# Patient Record
Sex: Female | Born: 1935 | Race: Black or African American | Hispanic: No | Marital: Single | Smoking: Never smoker
Health system: Southern US, Community
[De-identification: ages and names within clinical notes are randomized; demographics above are authoritative.]

## PROBLEM LIST (undated history)

## (undated) DIAGNOSIS — E785 Hyperlipidemia, unspecified: Secondary | ICD-10-CM

## (undated) DIAGNOSIS — I4891 Unspecified atrial fibrillation: Secondary | ICD-10-CM

## (undated) DIAGNOSIS — D649 Anemia, unspecified: Secondary | ICD-10-CM

## (undated) DIAGNOSIS — N189 Chronic kidney disease, unspecified: Secondary | ICD-10-CM

## (undated) DIAGNOSIS — Z7901 Long term (current) use of anticoagulants: Secondary | ICD-10-CM

## (undated) DIAGNOSIS — I10 Essential (primary) hypertension: Secondary | ICD-10-CM

## (undated) HISTORY — DX: Chronic kidney disease, unspecified: N18.9

## (undated) HISTORY — DX: Essential (primary) hypertension: I10

## (undated) HISTORY — DX: Long term (current) use of anticoagulants: Z79.01

## (undated) HISTORY — DX: Hyperlipidemia, unspecified: E78.5

## (undated) HISTORY — PX: PARTIAL HYSTERECTOMY: SHX80

## (undated) HISTORY — DX: Anemia, unspecified: D64.9

## (undated) HISTORY — DX: Unspecified atrial fibrillation: I48.91

## (undated) HISTORY — PX: APPENDECTOMY: SHX54

---

## 1993-05-03 HISTORY — PX: BUNIONECTOMY: SHX129

## 2000-08-11 ENCOUNTER — Other Ambulatory Visit: Admission: RE | Admit: 2000-08-11 | Discharge: 2000-08-11 | Payer: Self-pay

## 2002-11-19 ENCOUNTER — Encounter: Payer: Self-pay | Admitting: Family Medicine

## 2002-11-19 ENCOUNTER — Ambulatory Visit (HOSPITAL_COMMUNITY): Admission: RE | Admit: 2002-11-19 | Discharge: 2002-11-19 | Payer: Self-pay | Admitting: Family Medicine

## 2003-07-08 ENCOUNTER — Ambulatory Visit (HOSPITAL_COMMUNITY): Admission: RE | Admit: 2003-07-08 | Discharge: 2003-07-08 | Payer: Self-pay | Admitting: General Surgery

## 2003-12-12 ENCOUNTER — Ambulatory Visit (HOSPITAL_COMMUNITY): Admission: RE | Admit: 2003-12-12 | Discharge: 2003-12-12 | Payer: Self-pay | Admitting: Family Medicine

## 2004-03-16 ENCOUNTER — Ambulatory Visit: Payer: Self-pay | Admitting: *Deleted

## 2004-04-20 ENCOUNTER — Ambulatory Visit: Payer: Self-pay | Admitting: Internal Medicine

## 2004-05-22 ENCOUNTER — Ambulatory Visit: Payer: Self-pay | Admitting: Cardiology

## 2004-06-01 ENCOUNTER — Ambulatory Visit: Payer: Self-pay | Admitting: *Deleted

## 2004-06-17 ENCOUNTER — Ambulatory Visit: Payer: Self-pay | Admitting: *Deleted

## 2004-07-06 ENCOUNTER — Ambulatory Visit: Payer: Self-pay | Admitting: *Deleted

## 2004-07-27 ENCOUNTER — Ambulatory Visit: Payer: Self-pay | Admitting: *Deleted

## 2004-09-07 ENCOUNTER — Ambulatory Visit: Payer: Self-pay | Admitting: *Deleted

## 2004-10-06 ENCOUNTER — Ambulatory Visit: Payer: Self-pay | Admitting: Cardiology

## 2004-10-26 ENCOUNTER — Ambulatory Visit: Payer: Self-pay | Admitting: *Deleted

## 2004-11-30 ENCOUNTER — Ambulatory Visit: Payer: Self-pay | Admitting: Cardiology

## 2004-12-28 ENCOUNTER — Ambulatory Visit: Payer: Self-pay | Admitting: Cardiology

## 2005-01-29 ENCOUNTER — Ambulatory Visit: Payer: Self-pay | Admitting: *Deleted

## 2005-02-26 ENCOUNTER — Ambulatory Visit: Payer: Self-pay | Admitting: Cardiology

## 2005-03-29 ENCOUNTER — Ambulatory Visit: Payer: Self-pay | Admitting: *Deleted

## 2005-04-13 ENCOUNTER — Ambulatory Visit: Payer: Self-pay | Admitting: Cardiology

## 2005-04-23 ENCOUNTER — Ambulatory Visit (HOSPITAL_COMMUNITY): Admission: RE | Admit: 2005-04-23 | Discharge: 2005-04-23 | Payer: Self-pay | Admitting: Family Medicine

## 2005-05-18 ENCOUNTER — Ambulatory Visit: Payer: Self-pay | Admitting: *Deleted

## 2005-06-08 ENCOUNTER — Ambulatory Visit: Payer: Self-pay | Admitting: *Deleted

## 2005-08-11 ENCOUNTER — Ambulatory Visit: Payer: Self-pay | Admitting: Cardiology

## 2005-09-22 ENCOUNTER — Ambulatory Visit: Payer: Self-pay | Admitting: *Deleted

## 2005-11-02 ENCOUNTER — Ambulatory Visit: Payer: Self-pay | Admitting: *Deleted

## 2005-12-01 ENCOUNTER — Ambulatory Visit: Payer: Self-pay | Admitting: Cardiology

## 2005-12-29 ENCOUNTER — Ambulatory Visit: Payer: Self-pay | Admitting: *Deleted

## 2006-01-27 ENCOUNTER — Ambulatory Visit: Payer: Self-pay | Admitting: Cardiology

## 2006-02-03 ENCOUNTER — Ambulatory Visit: Payer: Self-pay | Admitting: Cardiology

## 2006-02-22 ENCOUNTER — Ambulatory Visit (HOSPITAL_COMMUNITY): Admission: RE | Admit: 2006-02-22 | Discharge: 2006-02-22 | Payer: Self-pay | Admitting: Family Medicine

## 2006-03-10 ENCOUNTER — Ambulatory Visit: Payer: Self-pay | Admitting: *Deleted

## 2006-04-07 ENCOUNTER — Ambulatory Visit: Payer: Self-pay | Admitting: Cardiology

## 2006-05-12 ENCOUNTER — Ambulatory Visit: Payer: Self-pay | Admitting: Internal Medicine

## 2006-06-17 ENCOUNTER — Ambulatory Visit: Payer: Self-pay | Admitting: Internal Medicine

## 2006-06-24 ENCOUNTER — Ambulatory Visit: Payer: Self-pay | Admitting: Cardiology

## 2006-07-11 ENCOUNTER — Ambulatory Visit: Payer: Self-pay | Admitting: Cardiology

## 2006-08-08 ENCOUNTER — Ambulatory Visit: Payer: Self-pay | Admitting: Internal Medicine

## 2006-09-06 ENCOUNTER — Ambulatory Visit: Payer: Self-pay | Admitting: Cardiovascular Disease

## 2006-10-04 ENCOUNTER — Ambulatory Visit: Payer: Self-pay | Admitting: Cardiology

## 2006-10-18 ENCOUNTER — Ambulatory Visit: Payer: Self-pay | Admitting: Cardiology

## 2006-11-22 ENCOUNTER — Ambulatory Visit: Payer: Self-pay | Admitting: Cardiology

## 2006-12-21 ENCOUNTER — Ambulatory Visit: Payer: Self-pay | Admitting: Cardiovascular Disease

## 2007-01-03 ENCOUNTER — Ambulatory Visit: Payer: Self-pay | Admitting: Cardiovascular Disease

## 2007-01-26 ENCOUNTER — Ambulatory Visit: Payer: Self-pay | Admitting: Cardiovascular Disease

## 2007-02-23 ENCOUNTER — Ambulatory Visit: Payer: Self-pay | Admitting: Cardiology

## 2007-03-16 ENCOUNTER — Ambulatory Visit (HOSPITAL_COMMUNITY): Admission: RE | Admit: 2007-03-16 | Discharge: 2007-03-16 | Payer: Self-pay | Admitting: Family Medicine

## 2007-03-24 ENCOUNTER — Ambulatory Visit: Payer: Self-pay | Admitting: Cardiology

## 2007-04-04 ENCOUNTER — Ambulatory Visit: Payer: Self-pay | Admitting: Cardiology

## 2007-04-24 ENCOUNTER — Ambulatory Visit: Payer: Self-pay | Admitting: Internal Medicine

## 2007-05-22 ENCOUNTER — Ambulatory Visit: Payer: Self-pay | Admitting: Cardiology

## 2007-06-19 ENCOUNTER — Ambulatory Visit: Payer: Self-pay | Admitting: Cardiology

## 2007-07-06 ENCOUNTER — Ambulatory Visit: Payer: Self-pay | Admitting: Cardiology

## 2007-07-17 ENCOUNTER — Ambulatory Visit: Payer: Self-pay | Admitting: Cardiology

## 2007-08-09 ENCOUNTER — Ambulatory Visit: Payer: Self-pay | Admitting: Cardiology

## 2007-09-13 ENCOUNTER — Ambulatory Visit: Payer: Self-pay | Admitting: Cardiology

## 2007-10-12 ENCOUNTER — Ambulatory Visit: Payer: Self-pay | Admitting: Cardiovascular Disease

## 2007-11-09 ENCOUNTER — Ambulatory Visit: Payer: Self-pay | Admitting: Internal Medicine

## 2007-11-23 ENCOUNTER — Ambulatory Visit: Payer: Self-pay | Admitting: Cardiology

## 2007-12-27 ENCOUNTER — Ambulatory Visit: Payer: Self-pay | Admitting: Cardiology

## 2008-01-04 ENCOUNTER — Ambulatory Visit: Payer: Self-pay | Admitting: Cardiology

## 2008-01-18 ENCOUNTER — Ambulatory Visit: Payer: Self-pay | Admitting: Cardiology

## 2008-01-25 ENCOUNTER — Ambulatory Visit: Payer: Self-pay | Admitting: Cardiology

## 2008-02-08 ENCOUNTER — Ambulatory Visit: Payer: Self-pay | Admitting: Cardiology

## 2008-02-22 ENCOUNTER — Ambulatory Visit: Payer: Self-pay | Admitting: Cardiology

## 2008-03-14 ENCOUNTER — Ambulatory Visit: Payer: Self-pay | Admitting: Cardiology

## 2008-04-08 ENCOUNTER — Ambulatory Visit (HOSPITAL_COMMUNITY): Admission: RE | Admit: 2008-04-08 | Discharge: 2008-04-08 | Payer: Self-pay | Admitting: Family Medicine

## 2008-04-18 ENCOUNTER — Ambulatory Visit: Payer: Self-pay | Admitting: Cardiology

## 2008-05-09 ENCOUNTER — Ambulatory Visit: Payer: Self-pay | Admitting: Cardiology

## 2008-06-10 ENCOUNTER — Ambulatory Visit: Payer: Self-pay | Admitting: Cardiology

## 2008-06-17 ENCOUNTER — Ambulatory Visit: Payer: Self-pay | Admitting: Cardiology

## 2008-07-04 ENCOUNTER — Ambulatory Visit: Payer: Self-pay | Admitting: Cardiology

## 2008-08-01 ENCOUNTER — Ambulatory Visit: Payer: Self-pay | Admitting: Cardiology

## 2008-08-22 ENCOUNTER — Ambulatory Visit: Payer: Self-pay | Admitting: Cardiology

## 2008-09-02 ENCOUNTER — Encounter (INDEPENDENT_AMBULATORY_CARE_PROVIDER_SITE_OTHER): Payer: Self-pay | Admitting: *Deleted

## 2008-09-02 ENCOUNTER — Ambulatory Visit: Payer: Self-pay | Admitting: Cardiology

## 2008-09-02 LAB — CONVERTED CEMR LAB
ALT: <8 units/L
AST: 14 units/L
Albumin: 4.3 g/dL
Alkaline Phosphatase: 64 units/L
BUN: 39 mg/dL
CO2: 17 meq/L
Calcium: 9.7 mg/dL
Chloride: 108 meq/L
Creatinine, Ser: 1.78 mg/dL
Glucose, Bld: 108 mg/dL
Potassium: 4.7 meq/L
Sodium: 139 meq/L
Total Protein: 7.7 g/dL

## 2008-09-26 ENCOUNTER — Ambulatory Visit: Payer: Self-pay | Admitting: Cardiology

## 2008-10-24 ENCOUNTER — Ambulatory Visit: Payer: Self-pay | Admitting: Cardiology

## 2008-11-14 ENCOUNTER — Ambulatory Visit: Payer: Self-pay | Admitting: Cardiology

## 2008-12-05 ENCOUNTER — Ambulatory Visit: Payer: Self-pay | Admitting: Cardiology

## 2008-12-16 ENCOUNTER — Encounter: Payer: Self-pay | Admitting: *Deleted

## 2008-12-23 ENCOUNTER — Encounter (INDEPENDENT_AMBULATORY_CARE_PROVIDER_SITE_OTHER): Payer: Self-pay | Admitting: *Deleted

## 2008-12-23 LAB — CONVERTED CEMR LAB
OCCULT 1: NEGATIVE
OCCULT 2: NEGATIVE
OCCULT 3: POSITIVE

## 2009-01-15 ENCOUNTER — Ambulatory Visit: Payer: Self-pay

## 2009-01-15 LAB — CONVERTED CEMR LAB: POC INR: 3.1

## 2009-02-10 ENCOUNTER — Ambulatory Visit: Payer: Self-pay | Admitting: Cardiology

## 2009-02-10 LAB — CONVERTED CEMR LAB: POC INR: 2.5

## 2009-03-10 ENCOUNTER — Ambulatory Visit: Payer: Self-pay | Admitting: Cardiology

## 2009-03-10 LAB — CONVERTED CEMR LAB: POC INR: 2.3

## 2009-04-09 ENCOUNTER — Encounter (INDEPENDENT_AMBULATORY_CARE_PROVIDER_SITE_OTHER): Payer: Self-pay | Admitting: *Deleted

## 2009-04-09 DIAGNOSIS — D649 Anemia, unspecified: Secondary | ICD-10-CM

## 2009-04-09 DIAGNOSIS — I10 Essential (primary) hypertension: Secondary | ICD-10-CM | POA: Insufficient documentation

## 2009-04-09 DIAGNOSIS — I4891 Unspecified atrial fibrillation: Secondary | ICD-10-CM | POA: Insufficient documentation

## 2009-04-10 ENCOUNTER — Ambulatory Visit: Payer: Self-pay | Admitting: Cardiology

## 2009-04-10 LAB — CONVERTED CEMR LAB: POC INR: 2.8

## 2009-04-11 ENCOUNTER — Encounter: Payer: Self-pay | Admitting: Cardiology

## 2009-04-14 ENCOUNTER — Encounter (INDEPENDENT_AMBULATORY_CARE_PROVIDER_SITE_OTHER): Payer: Self-pay | Admitting: *Deleted

## 2009-04-24 ENCOUNTER — Encounter (INDEPENDENT_AMBULATORY_CARE_PROVIDER_SITE_OTHER): Payer: Self-pay | Admitting: *Deleted

## 2009-04-24 ENCOUNTER — Encounter: Payer: Self-pay | Admitting: Cardiology

## 2009-04-28 LAB — CONVERTED CEMR LAB
ALT: <8 units/L (ref 0–35)
AST: 15 units/L (ref 0–37)
Albumin: 4.6 g/dL (ref 3.5–5.2)
Alkaline Phosphatase: 67 units/L (ref 39–117)
BUN: 35 mg/dL — ABNORMAL HIGH (ref 6–23)
Basophils Absolute: 0 10*3/uL (ref 0.0–0.1)
Basophils Relative: 0 % (ref 0–1)
CO2: 18 meq/L — ABNORMAL LOW (ref 19–32)
Calcium: 9.4 mg/dL (ref 8.4–10.5)
Chloride: 110 meq/L (ref 96–112)
Creatinine, Ser: 1.77 mg/dL — ABNORMAL HIGH (ref 0.40–1.20)
Eosinophils Absolute: 0.9 10*3/uL — ABNORMAL HIGH (ref 0.0–0.7)
Eosinophils Relative: 14 % — ABNORMAL HIGH (ref 0–5)
Glucose, Bld: 109 mg/dL — ABNORMAL HIGH (ref 70–99)
HCT: 32.3 % — ABNORMAL LOW (ref 36.0–46.0)
Hemoglobin: 10.5 g/dL — ABNORMAL LOW (ref 12.0–15.0)
Lymphocytes Relative: 31 % (ref 12–46)
Lymphs Abs: 2.1 10*3/uL (ref 0.7–4.0)
MCHC: 32.5 g/dL (ref 30.0–36.0)
MCV: 96.7 fL (ref 78.0–100.0)
Monocytes Absolute: 0.5 10*3/uL (ref 0.1–1.0)
Monocytes Relative: 8 % (ref 3–12)
Neutro Abs: 3.3 10*3/uL (ref 1.7–7.7)
Neutrophils Relative %: 48 % (ref 43–77)
Platelets: 237 10*3/uL (ref 150–400)
Potassium: 4.1 meq/L (ref 3.5–5.3)
RBC: 3.34 M/uL — ABNORMAL LOW (ref 3.87–5.11)
RDW: 13.2 % (ref 11.5–15.5)
Sodium: 141 meq/L (ref 135–145)
Total Bilirubin: 0.4 mg/dL (ref 0.3–1.2)
Total Protein: 7.9 g/dL (ref 6.0–8.3)
WBC: 6.9 10*3/uL (ref 4.0–10.5)

## 2009-04-30 ENCOUNTER — Ambulatory Visit: Payer: Self-pay | Admitting: Cardiology

## 2009-04-30 LAB — CONVERTED CEMR LAB
Basophils Absolute: 0 10*3/uL (ref 0.0–0.1)
Basophils Relative: 1 % (ref 0–1)
Eosinophils Absolute: 1.4 10*3/uL — ABNORMAL HIGH (ref 0.0–0.7)
Eosinophils Relative: 25 % — ABNORMAL HIGH (ref 0–5)
HCT: 30.5 % — ABNORMAL LOW (ref 36.0–46.0)
Hemoglobin: 10.4 g/dL — ABNORMAL LOW (ref 12.0–15.0)
Iron: 53 ug/dL (ref 42–145)
Lymphocytes Relative: 31 % (ref 12–46)
Lymphs Abs: 1.8 10*3/uL (ref 0.7–4.0)
MCHC: 34.1 g/dL (ref 30.0–36.0)
MCV: 93.8 fL (ref 78.0–100.0)
Monocytes Absolute: 0.5 10*3/uL (ref 0.1–1.0)
Monocytes Relative: 8 % (ref 3–12)
Neutro Abs: 2 10*3/uL (ref 1.7–7.7)
Neutrophils Relative %: 35 % — ABNORMAL LOW (ref 43–77)
Platelets: 251 10*3/uL (ref 150–400)
RBC: 3.25 M/uL — ABNORMAL LOW (ref 3.87–5.11)
RDW: 13 % (ref 11.5–15.5)
Retic Ct Pct: 0.8 % (ref 0.4–3.1)
Saturation Ratios: 19 % — ABNORMAL LOW (ref 20–55)
TIBC: 272 ug/dL (ref 250–470)
UIBC: 219 ug/dL
WBC: 5.7 10*3/uL (ref 4.0–10.5)

## 2009-05-05 ENCOUNTER — Encounter (INDEPENDENT_AMBULATORY_CARE_PROVIDER_SITE_OTHER): Payer: Self-pay | Admitting: *Deleted

## 2009-05-05 LAB — CONVERTED CEMR LAB
OCCULT 1: NEGATIVE
OCCULT 2: NEGATIVE
OCCULT 3: NEGATIVE

## 2009-05-06 ENCOUNTER — Encounter (INDEPENDENT_AMBULATORY_CARE_PROVIDER_SITE_OTHER): Payer: Self-pay | Admitting: *Deleted

## 2009-05-08 ENCOUNTER — Encounter (INDEPENDENT_AMBULATORY_CARE_PROVIDER_SITE_OTHER): Payer: Self-pay | Admitting: *Deleted

## 2009-05-08 ENCOUNTER — Telehealth (INDEPENDENT_AMBULATORY_CARE_PROVIDER_SITE_OTHER): Payer: Self-pay | Admitting: *Deleted

## 2009-05-08 ENCOUNTER — Encounter: Payer: Self-pay | Admitting: Cardiology

## 2009-05-15 ENCOUNTER — Ambulatory Visit: Payer: Self-pay | Admitting: Cardiovascular Disease

## 2009-05-15 LAB — CONVERTED CEMR LAB: POC INR: 2.5

## 2009-06-24 ENCOUNTER — Ambulatory Visit: Payer: Self-pay | Admitting: Cardiology

## 2009-06-24 ENCOUNTER — Encounter: Payer: Self-pay | Admitting: Adult Health

## 2009-07-03 ENCOUNTER — Ambulatory Visit: Payer: Self-pay | Admitting: Cardiology

## 2009-07-03 LAB — CONVERTED CEMR LAB: POC INR: 3.1

## 2009-07-22 ENCOUNTER — Telehealth (INDEPENDENT_AMBULATORY_CARE_PROVIDER_SITE_OTHER): Payer: Self-pay

## 2009-08-06 ENCOUNTER — Ambulatory Visit: Payer: Self-pay | Admitting: Cardiology

## 2009-08-06 LAB — CONVERTED CEMR LAB: POC INR: 2.1

## 2009-09-01 ENCOUNTER — Ambulatory Visit (HOSPITAL_COMMUNITY): Admission: RE | Admit: 2009-09-01 | Discharge: 2009-09-01 | Payer: Self-pay | Admitting: Family Medicine

## 2009-09-17 ENCOUNTER — Ambulatory Visit: Payer: Self-pay | Admitting: Cardiology

## 2009-10-01 ENCOUNTER — Encounter (INDEPENDENT_AMBULATORY_CARE_PROVIDER_SITE_OTHER): Payer: Self-pay | Admitting: *Deleted

## 2009-10-01 LAB — CONVERTED CEMR LAB
BUN: 27 mg/dL
CO2: 21 meq/L
Calcium: 9.4 mg/dL
Creatinine, Ser: 1.51 mg/dL
Glucose, Bld: 95 mg/dL
Sodium: 139 meq/L

## 2009-10-15 ENCOUNTER — Ambulatory Visit: Payer: Self-pay | Admitting: Cardiology

## 2009-11-20 ENCOUNTER — Ambulatory Visit: Payer: Self-pay | Admitting: Cardiology

## 2009-12-30 ENCOUNTER — Encounter (INDEPENDENT_AMBULATORY_CARE_PROVIDER_SITE_OTHER): Payer: Self-pay | Admitting: *Deleted

## 2010-01-01 ENCOUNTER — Ambulatory Visit: Payer: Self-pay | Admitting: Cardiology

## 2010-01-01 DIAGNOSIS — E785 Hyperlipidemia, unspecified: Secondary | ICD-10-CM | POA: Insufficient documentation

## 2010-01-03 LAB — CONVERTED CEMR LAB
Basophils Absolute: 0 10*3/uL (ref 0.0–0.1)
Basophils Relative: 1 % (ref 0–1)
HDL: 49 mg/dL (ref >39)
Hemoglobin: 11.6 g/dL — ABNORMAL LOW (ref 12.0–15.0)
LDL Cholesterol: 159 mg/dL — ABNORMAL HIGH (ref 0–99)
MCHC: 33.2 g/dL (ref 30.0–36.0)
Monocytes Absolute: 0.7 10*3/uL (ref 0.1–1.0)
Neutro Abs: 3.1 10*3/uL (ref 1.7–7.7)
Platelets: 230 10*3/uL (ref 150–400)
RDW: 12.7 % (ref 11.5–15.5)

## 2010-01-15 ENCOUNTER — Encounter (INDEPENDENT_AMBULATORY_CARE_PROVIDER_SITE_OTHER): Payer: Self-pay | Admitting: *Deleted

## 2010-01-22 ENCOUNTER — Ambulatory Visit: Payer: Self-pay | Admitting: Cardiology

## 2010-02-05 ENCOUNTER — Ambulatory Visit: Payer: Self-pay | Admitting: Cardiology

## 2010-02-05 LAB — CONVERTED CEMR LAB: POC INR: 2.1

## 2010-03-02 ENCOUNTER — Ambulatory Visit: Payer: Self-pay | Admitting: Cardiology

## 2010-03-05 ENCOUNTER — Ambulatory Visit: Payer: Self-pay | Admitting: Cardiology

## 2010-03-11 ENCOUNTER — Encounter: Payer: Self-pay | Admitting: Cardiology

## 2010-03-16 ENCOUNTER — Telehealth (INDEPENDENT_AMBULATORY_CARE_PROVIDER_SITE_OTHER): Payer: Self-pay

## 2010-04-08 ENCOUNTER — Ambulatory Visit: Payer: Self-pay | Admitting: Cardiovascular Disease

## 2010-04-22 ENCOUNTER — Telehealth (INDEPENDENT_AMBULATORY_CARE_PROVIDER_SITE_OTHER): Payer: Self-pay | Admitting: *Deleted

## 2010-04-24 ENCOUNTER — Emergency Department (HOSPITAL_COMMUNITY)
Admission: EM | Admit: 2010-04-24 | Discharge: 2010-04-24 | Disposition: A | Discharge status: Other Acute Inpatient Hospital | Payer: Self-pay | Source: Home / Self Care | Admitting: Emergency Medicine

## 2010-04-24 ENCOUNTER — Telehealth (INDEPENDENT_AMBULATORY_CARE_PROVIDER_SITE_OTHER): Payer: Self-pay | Admitting: *Deleted

## 2010-04-24 ENCOUNTER — Encounter: Payer: Self-pay | Admitting: Vascular Surgery

## 2010-04-24 ENCOUNTER — Inpatient Hospital Stay (HOSPITAL_COMMUNITY)
Admission: EM | Admit: 2010-04-24 | Discharge: 2010-05-06 | Payer: Self-pay | Source: Home / Self Care | Admitting: Emergency Medicine

## 2010-04-25 ENCOUNTER — Encounter: Payer: Self-pay | Admitting: Cardiology

## 2010-04-29 ENCOUNTER — Encounter: Payer: Self-pay | Admitting: Vascular Surgery

## 2010-04-30 ENCOUNTER — Ambulatory Visit: Payer: Self-pay

## 2010-04-30 ENCOUNTER — Encounter: Payer: Self-pay | Admitting: Internal Medicine

## 2010-05-01 ENCOUNTER — Encounter (INDEPENDENT_AMBULATORY_CARE_PROVIDER_SITE_OTHER): Payer: Self-pay | Admitting: Critical Care Medicine

## 2010-05-06 ENCOUNTER — Inpatient Hospital Stay
Admission: AD | Admit: 2010-05-06 | Discharge: 2010-05-29 | Payer: Self-pay | Source: Home / Self Care | Attending: Internal Medicine | Admitting: Internal Medicine

## 2010-05-06 LAB — PROTIME-INR
INR: 1.7 — ABNORMAL HIGH (ref 0.00–1.49)
Prothrombin Time: 20.2 seconds — ABNORMAL HIGH (ref 11.6–15.2)

## 2010-05-06 LAB — CBC
HCT: 33.2 % — ABNORMAL LOW (ref 36.0–46.0)
Hemoglobin: 10.6 g/dL — ABNORMAL LOW (ref 12.0–15.0)
MCH: 29.5 pg (ref 26.0–34.0)
MCHC: 31.9 g/dL (ref 30.0–36.0)
MCV: 92.5 fL (ref 78.0–100.0)
Platelets: 444 10*3/uL — ABNORMAL HIGH (ref 150–400)
RBC: 3.59 MIL/uL — ABNORMAL LOW (ref 3.87–5.11)
RDW: 14.5 % (ref 11.5–15.5)
WBC: 10.6 10*3/uL — ABNORMAL HIGH (ref 4.0–10.5)

## 2010-05-15 ENCOUNTER — Ambulatory Visit
Admission: RE | Admit: 2010-05-15 | Discharge: 2010-05-15 | Payer: Self-pay | Source: Home / Self Care | Attending: Vascular Surgery | Admitting: Vascular Surgery

## 2010-05-22 ENCOUNTER — Ambulatory Visit
Admission: RE | Admit: 2010-05-22 | Discharge: 2010-05-22 | Payer: Self-pay | Source: Home / Self Care | Attending: Vascular Surgery | Admitting: Vascular Surgery

## 2010-05-24 ENCOUNTER — Encounter: Payer: Self-pay | Admitting: Family Medicine

## 2010-05-24 ENCOUNTER — Encounter: Payer: Self-pay | Admitting: General Surgery

## 2010-05-25 NOTE — Assessment & Plan Note (Addendum)
OFFICE VISIT  [name], [name] DOB:  [birth year]                                       05/22/2010 CHART#:[id number]  This is a postop followup.  HISTORY OF PRESENT ILLNESS:  This is a 77 year old female that I have previously done a right leg thromboembolectomy on.  She had very bad tibial vessel disease that was not amendable to any type of surgical bypass.  She required four compartment fasciotomy and while in the hospital she  tore her lateral staple line and then undergoing wound care at the nursing home.  No fevers or chills noted by the patient. She notes some pain in her foot at this point.  She denies however any numbness or tingling in the foot and is able to ambulate with her foot with some difficulty.  PHYSICAL EXAMINATION:  She had a blood pressure 158/88, pulse of 79, respirations were 18.  On physical examination the right leg after taking down all staples and sutures the medial staple line is intact. There is a small area of ischemic skin medially that previously was not here.  On the lateral surface, at the inferior aspect of the incision there is also an area of ischemic skin change that was not present, the area that was previously adjacent to the dehiscence in the skin staple line is at this point is no better than before, by appearance it does not appear that an enzymatic debrider has been used on this wound.  The superior aspect of this incision however remains intact.  MEDICAL DECISION MAKING:  This is a 77 year old female status post a thromboembolectomy of right leg with four compartment fasciotomy.  This patient unfortunately does not have a revascularization option and subsequently would require amputation if she does not heal this wound. She is not interested in an amputation at this point so I am going to refer her to the [facility] wound care center for additional intervention for her wound as it appears that her nursing home  is not adequately addressing it though I had given them instructions to apply Santyl to the ischemic areas twice a day and then do wet-to-dry dressing changes on top, it does not appear that they have been doing such.  So we will have her referred to the [facility] wound care center and then see her back in a couple of weeks after that appointment is set up.    Fransisco Hertz, MD Electronically Signed  BLC/MEDQ  D:  05/22/2010  T:  05/25/2010  Job:  [id number]

## 2010-05-28 ENCOUNTER — Ambulatory Visit (HOSPITAL_COMMUNITY)
Admission: RE | Admit: 2010-05-28 | Discharge: 2010-05-28 | Payer: Self-pay | Source: Home / Self Care | Attending: Internal Medicine | Admitting: Internal Medicine

## 2010-06-01 ENCOUNTER — Encounter: Payer: Self-pay | Admitting: Cardiology

## 2010-06-01 ENCOUNTER — Encounter (HOSPITAL_BASED_OUTPATIENT_CLINIC_OR_DEPARTMENT_OTHER)
Admission: RE | Admit: 2010-06-01 | Discharge: 2010-06-02 | Payer: Self-pay | Source: Home / Self Care | Attending: Internal Medicine | Admitting: Internal Medicine

## 2010-06-01 ENCOUNTER — Telehealth: Payer: Self-pay | Admitting: Cardiology

## 2010-06-01 LAB — CONVERTED CEMR LAB: POC INR: 2.9

## 2010-06-02 NOTE — Assessment & Plan Note (Signed)
Summary: ROV SWELLING IN LEGS/TMJ   Visit Type:  Follow-up Primary Provider:  Dr.Gerald Hill  CC:  EDEMA IN FEET .  History of Present Illness: [name] is a pleasant 77 AAF who we are following with history of HTN and atrial fibrillation.  She is on coumadin and is followed by our office for PT INR.  When seen last by Dr. Dietrich Pates, Norvasc 5mg  was added in addition to Diltiazem 420mg  and Triamatrene-HCTZ. She had complaints of LEE edema and was seen by her primary care physician, Dr. Loleta Chance.  Norvasc was discontinued.  Her swelling was improved.    She is without complaints at this time. BP is moderately controlled without the Norvasc. Review of records shows that her last ECHO was in 1997 and showed mild LVH and normal EF at that time.    Current Medications (verified): 1)  Cardura 8 Mg Tabs (Doxazosin Mesylate) .... Take 1 Tablet Once A Day 2)  Lisinopril 40 Mg Tabs (Lisinopril) .... Take 1 Tab Daily 3)  Warfarin Sodium 5 Mg Tabs (Warfarin Sodium) .... Take 1 Tablet By Mouth As Directed 4)  Metoprolol Tartrate 25 Mg Tabs (Metoprolol Tartrate) .... Take One Tablet By Mouth Twice A Day 5)  Re Chlordiazepoxide/clidinium 5-2.5 Mg Caps (Clidinium-Chlordiazepoxide) .... Take1 Tab Three Times A Day 6)  Alprazolam 1 Mg Tabs (Alprazolam) .... Take 1 Tab Daily 7)  Triamterene-Hctz 37.5-25 Mg Tabs (Triamterene-Hctz) .... Take 1 Tab Daily 8)  Diltiazem Hcl Er Beads 240 Mg Xr24h-Cap (Diltiazem Hcl Er Beads) .... Take 1 Tablet By Mouth Once Daily 9)  Clonidine Hcl 0.1 Mg Tabs (Clonidine Hcl) .... Take 1 Tablet By Mouth Two Times A Day  Allergies (verified): No Known Drug Allergies  Past History:  Past medical, surgical, family and social histories (including risk factors) reviewed, and no changes noted (except as noted below).  Past Medical History: Reviewed history from 04/10/2009 and no changes required. ATRIAL FIBRILLATION, CHRONIC (ICD-427.31)-requiring anticoagulation RENAL INSUFFICIENCY  (ICD-588.9) HYPERTENSION (ICD-401.9) ANEMIA (ICD-285.9)  Past Surgical History: Reviewed history from 04/09/2009 and no changes required. appendectomy partial hysterectomy foot surgery 1995  Family History: Reviewed history from 04/09/2009 and no changes required. Father:deceased cause unknown Mother:deceased cause unknown  Social History: Reviewed history from 04/09/2009 and no changes required. Retired  Single  Tobacco Use - No.  Alcohol Use - no Regular Exercise - no Drug Use - no  Review of Systems       The patient complains of peripheral edema.         All other systems have been reviewed and are negative unless stated above.   Vital Signs:  Patient profile:   77 year old female Weight:      154 pounds Pulse rate:   53 / minute Pulse (ortho):   62 / minute BP sitting:   151 / 55  (right arm) BP standing:   161 / 72  Vitals Entered By: Dreama Saa, CNA (June 24, 2009 1:20 PM)  Serial Vital Signs/Assessments:  Time      Position  BP       Pulse  Resp  Temp     By 1:57 PM   Lying LA  140/60   53                    Lynn Via LPN 0:73 PM   Sitting   148/69   58  Larita Fife Via LPN 0:27 PM   Standing  161/72   62                    Lynn Via LPN   Physical Exam  General:  Well developed, well nourished, in no acute distress. Lungs:  Clear bilaterally to auscultation and percussion. Heart:  Non-displaced PMI, chest non-tender; regular rate and rhythm, S1, S2 without murmurs, rubs or gallops. Carotid upstroke normal, no bruit. Normal abdominal aortic size, no bruits. Femorals normal pulses, no bruits. Pedals normal pulses. No edema, no varicosities. Abdomen:  Bowel sounds positive; abdomen soft and non-tender without masses, organomegaly, or hernias noted. No hepatosplenomegaly. Pulses:  pulses normal in all 4 extremities Extremities:  1+ left pedal edema and 1+ right pedal edema.   Psych:  Normal affect.   EKG  Procedure date:   06/24/2009  Findings:      Afib with slow ventricular response 46 bpm.   Comments:      Evidence of remote septal MI.    Impression & Recommendations:  Problem # 1:  ATRIAL FIBRILLATION, CHRONIC (ICD-427.31) Her HR is bradycardic at this time.  I have discussed this with Dr. Dietrich Pates concerning medication adjustment in this setting.  We will decrease her cardiazem to 240mg  daily. She will follow-up with a rhythm strip in one week. Her updated medication list for this problem includes:    Warfarin Sodium 5 Mg Tabs (Warfarin sodium) .Marland Kitchen... Take 1 tablet by mouth as directed    Metoprolol Tartrate 25 Mg Tabs (Metoprolol tartrate) .Marland Kitchen... Take one tablet by mouth twice a day  Problem # 2:  HYPERTENSION (ICD-401.9) We will add clonidine 0.1mg  two times a day to maintain BP control with decrease in cardiazem.  This should not cause edema as norvasc did.  We will follow-up next week for BP check at time of rhythm strip.  Monitor HR with clonidine to avoid bradycardia. The following medications were removed from the medication list:    Cardizem Cd 120 Mg Xr24h-cap (Diltiazem hcl coated beads) .Marland Kitchen... Take 1 tab daily    Norvasc 5 Mg Tabs (Amlodipine besylate) .Marland Kitchen... Take 1 tablet by mouth once daily Her updated medication list for this problem includes:    Cardura 8 Mg Tabs (Doxazosin mesylate) .Marland Kitchen... Take 1 tablet once a day    Lisinopril 40 Mg Tabs (Lisinopril) .Marland Kitchen... Take 1 tab daily    Metoprolol Tartrate 25 Mg Tabs (Metoprolol tartrate) .Marland Kitchen... Take one tablet by mouth twice a day    Triamterene-hctz 37.5-25 Mg Tabs (Triamterene-hctz) .Marland Kitchen... Take 1 tab daily    Diltiazem Hcl Er Beads 240 Mg Xr24h-cap (Diltiazem hcl er beads) .Marland Kitchen... Take 1 tablet by mouth once daily    Clonidine Hcl 0.1 Mg Tabs (Clonidine hcl) .Marland Kitchen... Take 1 tablet by mouth two times a day  Patient Instructions: 1)  Your physician recommends that you schedule a follow-up appointment in: 1 week for blood pressure check with nurse and in 1  month with Dr. Dietrich Pates 2)  Your physician has recommended you make the following change in your medication: Stop taking Norvasc. Start taking Clonidine 0.1mg  by mouth two times a day and decrease Diltiazem (Cardiazem) to 240mg  by mouth once daily  Prescriptions: CARDURA 8 MG TABS (DOXAZOSIN MESYLATE) Take 1 tablet once a day  #30 x 6   Entered by:   Teressa Lower RN   Authorized by:   Joni Reining, NP   Signed by:   Teressa Lower RN on 06/24/2009  Method used:   Electronically to        [facility] [phone]* (retail)       [address]       [place], [place]  [address]       Ph: [phone] or [phone]       Fax: [phone]   RxID:   [id number] LISINOPRIL 40 MG TABS (LISINOPRIL) TAKE 1 TAB DAILY  #30 x 6   Entered by:   Teressa Lower RN   Authorized by:   Joni Reining, NP   Signed by:   Teressa Lower RN on 06/24/2009   Method used:   Electronically to        [facility]. [phone]* (retail)       [address]       [place], [place]  [address]       Ph: [phone] or [phone]       Fax: [phone]   RxID:   [id number] CLONIDINE HCL 0.1 MG TABS (CLONIDINE HCL) take 1 tablet by mouth two times a day  #60 x 6   Entered by:   Larita Fife Via LPN   Authorized by:   Joni Reining, NP   Signed by:   Larita Fife Via LPN on 83/15/1761   Method used:   Electronically to        [facility] [phone]* (retail)       [address]       [place], [place]  [address]       Ph: [phone] or [phone]       Fax: [phone]   RxID:   [id number] DILTIAZEM HCL ER BEADS 240 MG XR24H-CAP (DILTIAZEM HCL ER BEADS) take 1 tablet by mouth once daily  #30 x 6   Entered by:   Larita Fife Via LPN   Authorized by:   Joni Reining, NP   Signed by:   Larita Fife Via LPN on 78/24/2353   Method used:   Electronically to        [facility] [phone]* (retail)       [address]       [place], [place]  [address]        Ph: [phone] or [phone]       Fax: [phone]   RxID:   [id number]

## 2010-06-02 NOTE — Miscellaneous (Signed)
Summary: stool cards 05/05/2009  Clinical Lists Changes  Observations: Added new observation of HEMOCCULT 3: neg (05/05/2009 10:43) Added new observation of HEMOCCULT 2: neg (05/05/2009 10:43) Added new observation of HEMOCCULT 1: neg (05/05/2009 10:43)

## 2010-06-02 NOTE — Medication Information (Signed)
Summary: ccr-lr  Anticoagulant Therapy  Managed by: Vashti Hey, RN PCP: Jerral Bonito MD: Diona Browner MD, Remi Deter Indication 1: Aortic Valve Replacement (ICD-V43.3) Lab Used: [facility] [place] Site: [place] INR POC 1.6  Dietary changes: no    Health status changes: no    Bleeding/hemorrhagic complications: no    Recent/future hospitalizations: no    Any changes in medication regimen? no    Recent/future dental: no  Any missed doses?: no       Is patient compliant with meds? yes       Allergies: No Known Drug Allergies  Anticoagulation Management History:      The patient is taking warfarin and comes in today for a routine follow up visit.  Positive risk factors for bleeding include an age of 75 years or older and presence of serious comorbidities.  The bleeding index is 'intermediate risk'.  Positive CHADS2 values include History of HTN and Age > 63 years old.  The start date was 04/11/1996.  Anticoagulation responsible provider: Diona Browner MD, Remi Deter.  INR POC: 1.6.  Cuvette Lot#: [id number].  Exp: 10/11.    Anticoagulation Management Assessment/Plan:      The patient's current anticoagulation dose is Warfarin sodium 5 mg tabs: Take 1 tablet by mouth as directed.  The target INR is 2 - 3.  The next INR is due 03/23/2010.  Anticoagulation instructions were given to patient.  Results were reviewed/authorized by Vashti Hey, RN.  She was notified by Vashti Hey RN.         Prior Anticoagulation Instructions: INR 2.1 Pt has been taking coumadin 5mg  once daily except 7.5mg  on Thursdays Continue same dose  Current Anticoagulation Instructions: INR 1.6 Take coumadin 2 tablets tonight, 1 1/2 tablets tomorrow night then resume 1 tablet once daily except 1 1/2 tablets on Thursdays

## 2010-06-02 NOTE — Medication Information (Signed)
Summary: ccr-lr  Anticoagulant Therapy  Managed by: Vashti Hey, RN PCP: Jackelyn Poling Supervising MD: Eden Emms MD, Theron Arista Indication 1: Aortic Valve Replacement (ICD-V43.3) Lab Used: [facility] [place] Site: [place] INR POC 2.5  Dietary changes: no    Health status changes: no    Bleeding/hemorrhagic complications: no    Recent/future hospitalizations: no    Any changes in medication regimen? no    Recent/future dental: no  Any missed doses?: no       Is patient compliant with meds? yes       Allergies: No Known Drug Allergies  Anticoagulation Management History:      The patient is taking warfarin and comes in today for a routine follow up visit.  Positive risk factors for bleeding include an age of 75 years or older and presence of serious comorbidities.  The bleeding index is 'intermediate risk'.  Positive CHADS2 values include History of HTN and Age > 71 years old.  The start date was 04/11/1996.  Anticoagulation responsible provider: Eden Emms MD, Theron Arista.  INR POC: 2.5.  Cuvette Lot#: [id number].  Exp: 10/11.    Anticoagulation Management Assessment/Plan:      The patient's current anticoagulation dose is Warfarin sodium 5 mg tabs: Take 1 tablet by mouth as directed.  The target INR is 2 - 3.  The next INR is due 06/12/2009.  Anticoagulation instructions were given to patient.  Results were reviewed/authorized by Vashti Hey, RN.  She was notified by Vashti Hey RN.         Prior Anticoagulation Instructions: INR 2.8 Continue coumadin 5mg  once daily   Current Anticoagulation Instructions: INR 2.5 Continue coumadin 1 tablet once daily

## 2010-06-02 NOTE — Miscellaneous (Signed)
Summary: LABS BMP,10/01/2009  Clinical Lists Changes  Observations: Added new observation of CALCIUM: 9.4 mg/dL (44/05/270 53:66) Added new observation of CREATININE: 1.51 mg/dL (44/07/4740 59:56) Added new observation of BUN: 27 mg/dL (38/75/6433 29:51) Added new observation of BG RANDOM: 95 mg/dL (88/41/6606 30:16) Added new observation of CO2 PLSM/SER: 21 meq/L (10/01/2009 11:38) Added new observation of CL SERUM: 107 meq/L (10/01/2009 11:38) Added new observation of K SERUM: 4.2 meq/L (10/01/2009 11:38) Added new observation of NA: 139 meq/L (10/01/2009 11:38)

## 2010-06-02 NOTE — Progress Notes (Signed)
**Note De-Identified Lodema Parma Obfuscation** Summary: Questions regarding refills  Phone Note Call from Patient   Caller: Patient Reason for Call: Talk to Nurse Summary of Call: patient is really confused about refills on medications / pls return patients call/ tg Initial call taken by: Raechel Ache [facility],  March 16, 2010 10:37 AM  Follow-up for Phone Call        RX's faxed to [facility]. Pt. aware. Follow-up by: Larita Fife Emmilia Sowder LPN,  March 16, 2010 12:04 PM

## 2010-06-02 NOTE — Letter (Signed)
Summary: [place] [facility], agricultural at [facility]  [address] [place] [address]   Phone: [phone]  Fax: [phone]      May 08, 2009 MRN: [id number]   Jerusalen Clock [address] [place], [place]  [address]   Dear Ms. Mcclanahan,  Your test ordered by Selena Batten has been reviewed by your physician (or physician assistant) and was found to be normal or stable. Your physician (or physician assistant) felt no changes were needed at this time.  ____ Echocardiogram  ____ Cardiac Stress Test  __x__ Lab Work  ____ Peripheral vascular study of arms, legs or neck  ____ CT scan or X-ray  ____ Lung or Breathing test  _x___ Other: Stool Cards  No change in medical treatment at this time, per Dr. Dietrich Pates.  Thank you, Aksel Bencomo Allyne Gee RN    [place] Bing, MD, Lenise Arena.C.Gaylord Shih, MD, F.A.C.C Lewayne Bunting, MD, F.A.C.C Nona Dell, MD, F.A.C.C Charlton Haws, MD, Lenise Arena.C.C

## 2010-06-02 NOTE — Medication Information (Signed)
Summary: ccr-lr  Anticoagulant Therapy  Managed by: Isabel Hey, RN PCP: [name]: [name], [name] Indication 1: Aortic Valve Replacement (ICD-V43.3) Lab Used: [facility] [place] Site: [place] INR POC 2.8  Dietary changes: no    Health status changes: no    Bleeding/hemorrhagic complications: no    Recent/future hospitalizations: no    Any changes in medication regimen? yes       Details: started on tylenol/codeine bis if needed for pain  Recent/future dental: no  Any missed doses?: no       Is patient compliant with meds? yes       Allergies: No Known Drug Allergies  Anticoagulation Management History:      The patient is taking warfarin and comes in today for a routine follow up visit.  Positive risk factors for bleeding include an age of 75 years or older and presence of serious comorbidities.  The bleeding index is 'intermediate risk'.  Positive CHADS2 values include History of HTN and Age > 49 years old.  The start date was 04/11/1996.  Anticoagulation responsible provider: Diona Browner [name], [name].  INR POC: 2.8.  Cuvette Lot#: [id number].  Exp: 10/11.    Anticoagulation Management Assessment/Plan:      The patient's current anticoagulation dose is Warfarin sodium 5 mg tabs: Take 1 tablet by mouth as directed.  The target INR is 2 - 3.  The next INR is due 10/15/2009.  Anticoagulation instructions were given to patient.  Results were reviewed/authorized by Isabel Hey, RN.  She was notified by Isabel Hey RN.         Prior Anticoagulation Instructions: INR 2.1 Continue coumadin 5mg  once daily   Current Anticoagulation Instructions: INR 2.8 Continue coumadin 5mg  once daily

## 2010-06-02 NOTE — Medication Information (Signed)
Summary: PROTIME/TG  Anticoagulant Therapy  Managed by: Vashti Hey, RN PCP: Jackelyn Poling Supervising MD: Diona Browner MD, Remi Deter Indication 1: Aortic Valve Replacement (ICD-V43.3) Lab Used: [facility] [place] Site: [place] INR POC 2.1  Dietary changes: no    Health status changes: no    Bleeding/hemorrhagic complications: no    Recent/future hospitalizations: no    Any changes in medication regimen? no    Recent/future dental: no  Any missed doses?: no       Is patient compliant with meds? yes       Allergies: No Known Drug Allergies  Anticoagulation Management History:      The patient is taking warfarin and comes in today for a routine follow up visit.  Positive risk factors for bleeding include an age of 75 years or older and presence of serious comorbidities.  The bleeding index is 'intermediate risk'.  Positive CHADS2 values include History of HTN and Age > 84 years old.  The start date was 04/11/1996.  Anticoagulation responsible provider: Diona Browner MD, Remi Deter.  INR POC: 2.1.  Cuvette Lot#: [id number].  Exp: 10/11.    Anticoagulation Management Assessment/Plan:      The patient's current anticoagulation dose is Warfarin sodium 5 mg tabs: Take 1 tablet by mouth as directed.  The target INR is 2 - 3.  The next INR is due 09/04/2009.  Anticoagulation instructions were given to patient.  Results were reviewed/authorized by Vashti Hey, RN.  She was notified by Vashti Hey RN.         Prior Anticoagulation Instructions: INR 3.1 Take coumadin 1/2 tablet today then resume 1 tablet once daily   Current Anticoagulation Instructions: INR 2.1 Continue coumadin 5mg  once daily

## 2010-06-02 NOTE — Medication Information (Signed)
Summary: ccr-lr  Anticoagulant Therapy  Managed by: Vashti Hey, RN PCP: Jerral Bonito MD: Diona Browner MD, Remi Deter Indication 1: Aortic Valve Replacement (ICD-V43.3) Lab Used: [facility] [place] Site: [place] INR POC 1.6  Dietary changes: no    Health status changes: no    Bleeding/hemorrhagic complications: no    Recent/future hospitalizations: no    Any changes in medication regimen? no    Recent/future dental: no  Any missed doses?: no       Is patient compliant with meds? yes       Allergies: No Known Drug Allergies  Anticoagulation Management History:      The patient is taking warfarin and comes in today for a routine follow up visit.  Positive risk factors for bleeding include an age of 75 years or older and presence of serious comorbidities.  The bleeding index is 'intermediate risk'.  Positive CHADS2 values include History of HTN and Age > 21 years old.  The start date was 04/11/1996.  Anticoagulation responsible provider: Diona Browner MD, Remi Deter.  INR POC: 1.6.  Cuvette Lot#: [id number].  Exp: 10/11.    Anticoagulation Management Assessment/Plan:      The patient's current anticoagulation dose is Warfarin sodium 5 mg tabs: Take 1 tablet by mouth as directed.  The target INR is 2 - 3.  The next INR is due 02/05/2010.  Anticoagulation instructions were given to patient.  Results were reviewed/authorized by Vashti Hey, RN.  She was notified by Vashti Hey RN.         Prior Anticoagulation Instructions: INR 1.7 Increase coumadin to 5mg  once daily except 10mg  on Thursdays  Current Anticoagulation Instructions: INR 1.6 Increase coumadin to 1 tablet once daily except 2 tablets on Thursdays  Appended Document: ccr-lr Pt did not increase coumadin last visit as ordered.

## 2010-06-02 NOTE — Assessment & Plan Note (Signed)
Summary: 2 mth nurse visit per checkout on 01/01/10/tg  Nurse Visit   Vital Signs:  Patient profile:   77 year old female Weight:      142 pounds BMI:     23.72 O2 Sat:      99 % on Room air Pulse rate:   84 / minute BP sitting:   165 / 81  (right arm)  Vitals Entered By: Dreama Saa, CNA (March 05, 2010 3:24 PM)  O2 Flow:  Room air  Current Medications (verified): 1)  Lisinopril 40 Mg Tabs (Lisinopril) .... Take 1 Tab Daily 2)  Warfarin Sodium 5 Mg Tabs (Warfarin Sodium) .... Take 1 Tablet By Mouth As Directed 3)  Metoprolol Succinate 50 Mg Xr24h-Tab (Metoprolol Succinate) .... Take 1 1/2 Tablets By Mouth Daily 4)  Re Chlordiazepoxide/clidinium 5-2.5 Mg Caps (Clidinium-Chlordiazepoxide) .... Take1 Tab Daily 5)  Alprazolam 1 Mg Tabs (Alprazolam) .... Take 1 Tab Daily 6)  Triamterene-Hctz 37.5-25 Mg Tabs (Triamterene-Hctz) .... Take 1 Tab Daily 7)  Diltiazem Hcl Er Beads 240 Mg Xr24h-Cap (Diltiazem Hcl Er Beads) .... Take 1 Tablet By Mouth Once Daily 8)  Clonidine Hcl 0.1 Mg Tabs (Clonidine Hcl) .... Take 1 Tablet By Mouth Two Times A Day 9)  Tylenol With Codeine #3 300-30 Mg Tabs (Acetaminophen-Codeine) .... Take As Needed 10)  Lidoderm 5 % Ptch (Lidocaine) .... Apply 1 Patch Q12 Hrs 11)  Colace 100 Mg Caps (Docusate Sodium) .... Take As Needed 12)  Tylenol With Codeine #3 300-30 Mg Tabs (Acetaminophen-Codeine) .... Take 1 Tab Two Times A Day  Allergies (verified): No Known Drug Allergies  Primary Provider:  Dr.Gerald Hill   History of Present Illness: S: Pt. returns to office for [facility] with nurse. B: On last OV with Dr. Dietrich Pates on 01-01-10 pt. was advised change Metoprolol tart. to succ. and to monitor BP's at home and bring readings to this visit.  A: Pt. c/o some swelling in left ankle, otherwise she has no complaints at this time. Mrs. Tello did bring medications with her today and states she is taking as directed. She states she has not taken her medications yet   today due to having to go to the bathroom so often (she is on Triamterene-HCTZ) and that when she returns home she will take. Also, pt. states she was taking home BP's but lost paper (diary). She cant remember what the readings were. BP today is 165/81. R: Pt. advised to take meds at around the same time everyday no matter if she has errands to run or staying home and to resume monitoring home BP's.   03/08/10 Recheck blood pressure in a nursing visit in one week. Lakeland Bing, M.D.  Phone is busy, will continue to call.   Larita Fife Via LPN  March 09, 2010 10:48 AM   Phone is still busy, will continue to call.         Larita Fife Via LPN  March 10, 2010 2:43 PM   Phone remains busy, will mail letter. Larita Fife Via LPN  March 11, 2010 12:05 PM

## 2010-06-02 NOTE — Progress Notes (Signed)
Summary: rx refill needed tonight  Phone Note Call from Patient Call back at Home Phone [phone]   Caller: pt Reason for Call: Talk to Nurse Summary of Call: pt has not taken coumadin since sunday she has miss placed her rx and can not find it. Please call new rx in for her. Initial call taken by: Tanya Jackson,  July 22, 2009 4:38 PM  Follow-up for Phone Call        Pt. advised that RX has been refilled. Follow-up by: Lynn Via LPN,  July 22, 2009 4:51 PM    New/Updated Medications: WARFARIN SODIUM 5 MG TABS (WARFARIN SODIUM) Take 1 tablet by mouth as directed Prescriptions: WARFARIN SODIUM 5 MG TABS (WARFARIN SODIUM) Take 1 tablet by mouth as directed  #60 x 3   Entered by:   Lynn Via LPN   Authorized by:   Robert M Rothbart, MD, FACC   Signed by:   Lynn Via LPN on 07/22/2009   Method used:   Electronically to        [id number]* (retail)       [address]       [place], [place]  [address]       Ph: [phone] or [phone]       Fax: [phone]   RxID:   [id number]

## 2010-06-02 NOTE — Medication Information (Signed)
Summary: ccr-lr  Anticoagulant Therapy  Managed by: Isabel Hey, RN PCP: [name]: [name], [name] Indication 1: Aortic Valve Replacement (ICD-V43.3) Lab Used: [facility] [place] Site: [place] INR POC 3.1  Dietary changes: no    Health status changes: no    Bleeding/hemorrhagic complications: no    Recent/future hospitalizations: no    Any changes in medication regimen? no    Recent/future dental: no  Any missed doses?: no       Is patient compliant with meds? yes       Allergies: No Known Drug Allergies  Anticoagulation Management History:      The patient is taking warfarin and comes in today for a routine follow up visit.  Positive risk factors for bleeding include an age of 75 years or older and presence of serious comorbidities.  The bleeding index is 'intermediate risk'.  Positive CHADS2 values include History of HTN and Age > 75 years old.  The start date was 04/11/1996.  Anticoagulation responsible provider: Diona Browner [name], [name].  INR POC: 3.1.  Cuvette Lot#: [id number].  Exp: 10/11.    Anticoagulation Management Assessment/Plan:      The patient's current anticoagulation dose is Warfarin sodium 5 mg tabs: Take 1 tablet by mouth as directed.  The target INR is 2 - 3.  The next INR is due 07/31/2009.  Anticoagulation instructions were given to patient.  Results were reviewed/authorized by Isabel Hey, RN.  She was notified by Isabel Hey RN.         Prior Anticoagulation Instructions: INR 2.5 Continue coumadin 1 tablet once daily   Current Anticoagulation Instructions: INR 3.1 Take coumadin 1/2 tablet today then resume 1 tablet once daily

## 2010-06-02 NOTE — Letter (Signed)
Summary: Generic Letter  [facility]ologist at [facility]  [address] [place] [address]   Phone: [phone]  Fax: [phone]        March 11, 2010 MRN: [id number]    [name] [address] [place], [place]  [address]    Dear Ms. Cacciola,     We have been trying to reach you by telephone without success. Due   to your blood pressure check on November 3, Dr. Dietrich Pates recommends   that you come in next week for another blood pressure check with nurse.   Please remember to take medications before you come to visit.         Sincerely,  Larita Fife Via LPN  This letter has been electronically signed by your physician.

## 2010-06-02 NOTE — Medication Information (Signed)
Summary: protime/tg  Anticoagulant Therapy  Managed by: Vashti Hey, RN PCP: Jackelyn Poling Supervising MD: Dietrich Pates MD, Molly Maduro Indication 1: Aortic Valve Replacement (ICD-V43.3) Lab Used: [facility] [place] Site: [place] INR POC 1.7  Dietary changes: no    Health status changes: no    Bleeding/hemorrhagic complications: no    Recent/future hospitalizations: no    Any changes in medication regimen? no    Recent/future dental: no  Any missed doses?: no       Is patient compliant with meds? yes       Allergies: No Known Drug Allergies  Anticoagulation Management History:      The patient is taking warfarin and comes in today for a routine follow up visit.  Positive risk factors for bleeding include an age of 75 years or older and presence of serious comorbidities.  The bleeding index is 'intermediate risk'.  Positive CHADS2 values include History of HTN and Age > 45 years old.  The start date was 04/11/1996.  Anticoagulation responsible Zhi Geier: Dietrich Pates MD, Molly Maduro.  INR POC: 1.7.  Cuvette Lot#: [id number].  Exp: 10/11.    Anticoagulation Management Assessment/Plan:      The patient's current anticoagulation dose is Warfarin sodium 5 mg tabs: Take 1 tablet by mouth as directed.  The target INR is 2 - 3.  The next INR is due 01/15/2010.  Anticoagulation instructions were given to patient.  Results were reviewed/authorized by Vashti Hey, RN.  She was notified by Vashti Hey RN.         Prior Anticoagulation Instructions: INR 1.6 Take coumadin 2 tablets tonight, 1 1/2 tablets tomorrow night then resume 1 tablet once daily   Current Anticoagulation Instructions: INR 1.7 Increase coumadin to 5mg  once daily except 10mg  on Thursdays

## 2010-06-02 NOTE — Medication Information (Signed)
Summary: ccr  Anticoagulant Therapy  Managed by: Vashti Hey, RN PCP: Jackelyn Poling Supervising MD: Dietrich Pates MD, Molly Maduro Indication 1: Aortic Valve Replacement (ICD-V43.3) Lab Used: [facility] [place] Site: [place] INR POC 1.6  Dietary changes: no    Health status changes: no    Bleeding/hemorrhagic complications: no    Recent/future hospitalizations: no    Any changes in medication regimen? no    Recent/future dental: no  Any missed doses?: yes     Details: missed 1-2 doses   ran out and didn't have a way to the drug store  Is patient compliant with meds? yes       Allergies: No Known Drug Allergies  Anticoagulation Management History:      The patient is taking warfarin and comes in today for a routine follow up visit.  Positive risk factors for bleeding include an age of 75 years or older and presence of serious comorbidities.  The bleeding index is 'intermediate risk'.  Positive CHADS2 values include History of HTN and Age > 29 years old.  The start date was 04/11/1996.  Anticoagulation responsible provider: Dietrich Pates MD, Molly Maduro.  INR POC: 1.6.  Cuvette Lot#: [id number].  Exp: 10/11.    Anticoagulation Management Assessment/Plan:      The patient's current anticoagulation dose is Warfarin sodium 5 mg tabs: Take 1 tablet by mouth as directed.  The target INR is 2 - 3.  The next INR is due 12/11/2009.  Anticoagulation instructions were given to patient.  Results were reviewed/authorized by Vashti Hey, RN.  She was notified by Vashti Hey RN.         Prior Anticoagulation Instructions: INR 2.3 Continue coumadin 5mg  once daily   Current Anticoagulation Instructions: INR 1.6 Take coumadin 2 tablets tonight, 1 1/2 tablets tomorrow night then resume 1 tablet once daily

## 2010-06-02 NOTE — Assessment & Plan Note (Signed)
Summary: due for f/u per pt's daughter/tg   Visit Type:  Follow-up Primary Provider:  Dr.Gerald Hill   History of Present Illness: [name] returns to the office for continued assessment and treatment of hypertension and atrial fibrillation.  Since her last visit, she has done generally well with no palpitations, no chest discomfort and no dyspnea.  Unfortunately, she suffered a fall approximately 8 months ago and has had lumbosacral pain on most days since.  Plain films were obtained and were apparently negative.  She has been treated with analgesics but not with anti-inflammatory agents or epidural injection.     Current Medications (verified): 1)  Cardura 8 Mg Tabs (Doxazosin Mesylate) .... Take 1 Tablet Once A Day 2)  Lisinopril 40 Mg Tabs (Lisinopril) .... Take 1 Tab Daily 3)  Warfarin Sodium 5 Mg Tabs (Warfarin Sodium) .... Take 1 Tablet By Mouth As Directed 4)  Metoprolol Succinate 50 Mg Xr24h-Tab (Metoprolol Succinate) .... Take 1 1/2 Tablets By Mouth Daily 5)  Re Chlordiazepoxide/clidinium 5-2.5 Mg Caps (Clidinium-Chlordiazepoxide) .... Take1 Tab Daily 6)  Alprazolam 1 Mg Tabs (Alprazolam) .... Take 1 Tab Daily 7)  Triamterene-Hctz 37.5-25 Mg Tabs (Triamterene-Hctz) .... Take 1 Tab Daily 8)  Diltiazem Hcl Er Beads 240 Mg Xr24h-Cap (Diltiazem Hcl Er Beads) .... Take 1 Tablet By Mouth Once Daily 9)  Clonidine Hcl 0.1 Mg Tabs (Clonidine Hcl) .... Take 1 Tablet By Mouth Two Times A Day 10)  Tylenol With Codeine #3 300-30 Mg Tabs (Acetaminophen-Codeine) .... Take As Needed 11)  Lidoderm 5 % Ptch (Lidocaine) .... Apply 1 Patch Q12 Hrs 12)  Colace 100 Mg Caps (Docusate Sodium) .... Take As Needed  Allergies (verified): No Known Drug Allergies  Past History:  PMH, FH, and Social History reviewed and updated.  Past Medical History: ATRIAL FIBRILLATION, CHRONIC (ICD-427.31)-requiring anticoagulation HYPERTENSION (ICD-401.9) RENAL INSUFFICIENCY (ICD-588.9) ANEMIA  (ICD-285.9)  Past Surgical History: Appendectomy Partial hysterectomy Bunionectomy-1995  Review of Systems       See history of present illness.  Vital Signs:  Patient profile:   77 year old female Weight:      140 pounds Pulse rate:   98 / minute BP sitting:   191 / 98  (right arm)  Vitals Entered By: Dreama Saa, CNA (January 01, 2010 2:05 PM)  Physical Exam  General:  Proportionate weight and height; well developed; no acute distress:   Neck-No JVD; no carotid bruits: Lungs-No tachypnea, no rales; no rhonchi; no wheezes: Cardiovascular-normal PMI; normal S1 and S2; modest systolic ejection murmur Abdomen-BS normal; soft and non-tender without masses or organomegaly:  Musculoskeletal-No deformities, no cyanosis or clubbing: Neurologic-Normal cranial nerves; symmetric strength and tone:  Skin-Warm, no significant lesions: Extremities-Nl distal pulses; 1+ ankle edema:     Impression & Recommendations:  Problem # 1:  HYPERLIPIDEMIA (ICD-272.4) No recent lipid profile available; one will be obtained.  Patient currently receiving no pharmaceutical  therapy for hyperlipidemia, but in the absence of known vascular disease, such treatment may be unnecessary.  Problem # 2:  ATRIAL FIBRILLATION, CHRONIC (ICD-427.31) Patient has tolerated her arrhythmia well.  Heart rate control is somewhat suboptimal today.  Metoprolol tartrate will be changed to metoprolol succinate 75 mg q.d. for ease of administration and for better control of heart rate and blood pressure.  Problem # 3:  HYPERTENSION (ICD-401.9) Repeat blood pressure was 155/90, improved from her initial value, but still suboptimal.  There is room for dosage adjustment, particularly with metoprolol, diltiazem and clonidine; however, she has  had bradycardia in the past, and adjustment of her AV nodal blocking agents must be undertaken with caution.  Accordingly, we will slightly increase beta blocker and change to a once a  day preparation.  Blood pressure will be monitored.  She will return to see the cardiology nurses in 2 months and to see me in 7 months.  Problem # 4:  ANTICOAGULATION (ICD-V58.61) A CBC will be obtained to monitor anticoagulation therapy; stool for Hemoccult testing was negative 7 months ago.  Problem # 5:  ANEMIA (ICD-285.9) Moderate anemia persists.  Iron studies were negative last year.  Although creatinine has been only modestly elevated, this is apparently anemia of chronic disease related to renal insufficiency.  Further assessment may be useful to provide additional confidence in that diagnosis.  Dr. Loleta Chance will determine whether or not to proceed.  Other Orders: Future Orders: T-Lipid Profile ([id number]) ... 01/02/2010 T-CBC w/Diff ([id number]) ... 01/02/2010 T-TSH ([id number]) ... 01/02/2010  Patient Instructions: 1)  Your physician recommends that you schedule a follow-up appointment in: 7 months 2)  Your physician recommends that you return for lab work in: tomorrow 3)  Your physician has recommended you make the following change in your medication:  change metoprolol to succinate 75mg  daily 4)  You have been referred to nurse visit in 2 months 5)  Your physician has requested that you regularly monitor and record your blood pressure readings at home.  Please use the same machine at the same time of day to check your readings and record them to bring to your follow-up visit. bring bp diary to nurse visit  Prescriptions: METOPROLOL SUCCINATE 50 MG XR24H-TAB (METOPROLOL SUCCINATE) TAKE 1 1/2 TABLETS BY MOUTH DAILY  #45 x 3   Entered by:   Teressa Lower RN   Authorized by:   Kathlen Brunswick, MD, [facility]   Signed by:   Teressa Lower RN on 01/01/2010   Method used:   Electronically to        [facility] [phone]* (retail)       [address]       [place], [place]  [address]       Ph: [phone] or [phone]       Fax: [phone]   RxID:    [id number]

## 2010-06-02 NOTE — Medication Information (Signed)
Summary: ccr-lr  Anticoagulant Therapy  Managed by: Vashti Hey, RN PCP: Jackelyn Poling Supervising MD: Dietrich Pates MD, Molly Maduro Indication 1: Aortic Valve Replacement (ICD-V43.3) Lab Used: [facility] [place] Site: [place] INR POC 2.1  Dietary changes: no    Health status changes: no    Bleeding/hemorrhagic complications: no    Recent/future hospitalizations: no    Any changes in medication regimen? no    Recent/future dental: no  Any missed doses?: no       Is patient compliant with meds? yes       Allergies: No Known Drug Allergies  Anticoagulation Management History:      The patient is taking warfarin and comes in today for a routine follow up visit.  Positive risk factors for bleeding include an age of 75 years or older and presence of serious comorbidities.  The bleeding index is 'intermediate risk'.  Positive CHADS2 values include History of HTN and Age > 35 years old.  The start date was 04/11/1996.  Anticoagulation responsible provider: Dietrich Pates MD, Molly Maduro.  INR POC: 2.1.  Cuvette Lot#: [id number].  Exp: 10/11.    Anticoagulation Management Assessment/Plan:      The patient's current anticoagulation dose is Warfarin sodium 5 mg tabs: Take 1 tablet by mouth as directed.  The target INR is 2 - 3.  The next INR is due 03/02/2010.  Anticoagulation instructions were given to patient.  Results were reviewed/authorized by Vashti Hey, RN.  She was notified by Vashti Hey RN.         Prior Anticoagulation Instructions: INR 1.6 Increase coumadin to 1 tablet once daily except 2 tablets on Thursdays  Current Anticoagulation Instructions: INR 2.1 Pt has been taking coumadin 5mg  once daily except 7.5mg  on Thursdays Continue same dose

## 2010-06-02 NOTE — Assessment & Plan Note (Signed)
Summary: 1 wk bp check per checkout on 06/24/09/tg  Nurse Visit   Vital Signs:  Patient profile:   77 year old female Height:      65 inches Weight:      150 pounds O2 Sat:      98 % on Room air Pulse rate:   52 / minute BP sitting:   144 / 74  (left arm)  Vitals Entered By: Teressa Lower RN (July 03, 2009 11:12 AM)  O2 Flow:  Room air  Visit Type:  1 week bp check Primary Provider:  Dr.Gerald Hill   History of Present Illness: back pain, no other c/o   Current Medications (verified): 1)  Cardura 8 Mg Tabs (Doxazosin Mesylate) .... Take 1 Tablet Once A Day 2)  Lisinopril 40 Mg Tabs (Lisinopril) .... Take 1 Tab Daily 3)  Warfarin Sodium 5 Mg Tabs (Warfarin Sodium) .... Take 1 Tablet By Mouth As Directed 4)  Metoprolol Tartrate 25 Mg Tabs (Metoprolol Tartrate) .... Take One Tablet By Mouth Twice A Day 5)  Re Chlordiazepoxide/clidinium 5-2.5 Mg Caps (Clidinium-Chlordiazepoxide) .... Take1 Tab Three Times A Day 6)  Alprazolam 1 Mg Tabs (Alprazolam) .... Take 1 Tab Daily 7)  Triamterene-Hctz 37.5-25 Mg Tabs (Triamterene-Hctz) .... Take 1 Tab Daily 8)  Diltiazem Hcl Er Beads 240 Mg Xr24h-Cap (Diltiazem Hcl Er Beads) .... Take 1 Tablet By Mouth Once Daily 9)  Clonidine Hcl 0.1 Mg Tabs (Clonidine Hcl) .... Take 1 Tablet By Mouth Two Times A Day  Allergies (verified): No Known Drug Allergies

## 2010-06-02 NOTE — Medication Information (Signed)
Summary: ccr-lr  Anticoagulant Therapy  Managed by: Vashti Hey, RN PCP: Jerral Bonito MD: Diona Browner MD, Remi Deter Indication 1: Aortic Valve Replacement (ICD-V43.3) Lab Used: [facility] [place] Site: [place] INR POC 2.3  Dietary changes: no    Health status changes: no    Bleeding/hemorrhagic complications: no    Recent/future hospitalizations: no    Any changes in medication regimen? yes       Details: started on pain pill with codeine  Taking 1 qd   Recent/future dental: no  Any missed doses?: yes     Details: missed 2 doses  Rx ran out  Is patient compliant with meds? yes       Allergies: No Known Drug Allergies  Anticoagulation Management History:      The patient is taking warfarin and comes in today for a routine follow up visit.  Positive risk factors for bleeding include an age of 56 years or older and presence of serious comorbidities.  The bleeding index is 'intermediate risk'.  Positive CHADS2 values include History of HTN and Age > 28 years old.  The start date was 04/11/1996.  Anticoagulation responsible provider: Diona Browner MD, Remi Deter.  INR POC: 2.3.  Cuvette Lot#: [id number].  Exp: 10/11.    Anticoagulation Management Assessment/Plan:      The patient's current anticoagulation dose is Warfarin sodium 5 mg tabs: Take 1 tablet by mouth as directed.  The target INR is 2 - 3.  The next INR is due 11/12/2009.  Anticoagulation instructions were given to patient.  Results were reviewed/authorized by Vashti Hey, RN.  She was notified by Vashti Hey RN.         Prior Anticoagulation Instructions: INR 2.8 Continue coumadin 5mg  once daily   Current Anticoagulation Instructions: INR 2.3 Continue coumadin 5mg  once daily

## 2010-06-02 NOTE — Progress Notes (Signed)
Summary: Lab Results  Phone Note Call from Patient   Caller: Patient Reason for Call: Lab or Test Results Summary of Call: Pt called for lab results/tg Initial call taken by: Raechel Ache [facility],  May 08, 2009 9:24 AM  Follow-up for Phone Call        left message on machine and sent results letter to pt Follow-up by: Teressa Lower RN,  May 08, 2009 11:20 AM

## 2010-06-02 NOTE — Letter (Signed)
Summary: Appointment - Missed  [facility]     [place], [place]    Phone:   Fax:      January 15, 2010 MRN: [id number]   [name] [address] [place], [place]  [address]   Dear [name],  Our records indicate you missed your appointment on           01/15/10 [facility]            It is very important that we reach you to reschedule this appointment. We look forward to participating in your health care needs. Please contact us at the number listed above at your earliest convenience to reschedule this appointment.     Sincerely,    Glass blower/designer

## 2010-06-02 NOTE — Progress Notes (Signed)
Summary: Clinical Lists Update/BP LIST  Clinical Lists Update/BP LIST   Imported By: Faythe Ghee 05/08/2009 11:38:48  _____________________________________________________________________  External Attachment:    Type:   Image     Comment:   External Document

## 2010-06-04 NOTE — Medication Information (Signed)
Summary: CCR  Anticoagulant Therapy  Managed by: Vashti Hey, RN PCP: Jackelyn Poling Supervising MD: Eden Emms MD, Theron Arista Indication 1: Aortic Valve Replacement (ICD-V43.3) Lab Used: [facility] [place] Site: [place] INR POC 1.3  Dietary changes: no    Health status changes: no    Bleeding/hemorrhagic complications: no    Recent/future hospitalizations: no    Any changes in medication regimen? no    Recent/future dental: no  Any missed doses?: yes     Details: has been missing doses  Is patient compliant with meds? yes       Allergies: No Known Drug Allergies  Anticoagulation Management History:      The patient is taking warfarin and comes in today for a routine follow up visit.  Positive risk factors for bleeding include an age of 75 years or older and presence of serious comorbidities.  The bleeding index is 'intermediate risk'.  Positive CHADS2 values include History of HTN and Age > 67 years old.  The start date was 04/11/1996.  Anticoagulation responsible provider: Eden Emms MD, Theron Arista.  INR POC: 1.3.  Cuvette Lot#: [id number].  Exp: 10/11.    Anticoagulation Management Assessment/Plan:      The patient's current anticoagulation dose is Warfarin sodium 5 mg tabs: 1 tablet once daily except 1 1/2 tablets on T,Th,Sat or as directed by anticoagulation clinic.  The target INR is 2 - 3.  The next INR is due 04/30/2010.  Anticoagulation instructions were given to patient.  Results were reviewed/authorized by Vashti Hey, RN.  She was notified by Vashti Hey RN.         Prior Anticoagulation Instructions: INR 1.6 Take coumadin 2 tablets tonight, 1 1/2 tablets tomorrow night then resume 1 tablet once daily except 1 1/2 tablets on Thursdays  Current Anticoagulation Instructions: INR 1.3 Missed doses Take coumadin 2 tablets tonight then increase dose to 5mg  once daily except 7.5mg  on Tuesdays, Thursdays and Saturdays  Prescriptions: WARFARIN SODIUM 5 MG TABS  (WARFARIN SODIUM) 1 tablet once daily except 1 1/2 tablets on T,Th,Sat or as directed by anticoagulation clinic  #60 x 3   Entered by:   Vashti Hey RN   Authorized by:   Kathlen Brunswick, MD, [facility]   Signed by:   Vashti Hey RN on 04/15/2010   Method used:   Electronically to        [facility] [phone] (retail)       [address]       [place], [place]  [address]       Ph: [phone] or [phone]       Fax: [phone]   RxID:   [id number] LISINOPRIL 40 MG TABS (LISINOPRIL) TAKE 1 TAB DAILY  #30 x 5   Entered by:   Vashti Hey RN   Authorized by:   Kathlen Brunswick, MD, [facility]   Signed by:   Vashti Hey RN on 04/15/2010   Method used:   Electronically to        [facility] [phone] (retail)       [address]       [place], [place]  [address]       Ph: [phone] or [phone]       Fax: [phone]   RxID:   [id number]

## 2010-06-04 NOTE — Progress Notes (Signed)
Summary: Medication Concerns  Phone Note Call from Patient Call back at [phone]   Caller: Patient Reason for Call: Talk to Nurse Summary of Call: patient states that she has been having "problems" / states that she has been urinating all night and the backs of her legs are hurting / thinks it is coming from the Warfarin / pls return patient's call at above number / tg Initial call taken by: Raechel Ache [facility],  April 22, 2010 12:01 PM  Follow-up for Phone Call        S: pt c/o urinating alot and leg pain and numbness of her right leg B:nurse visit in Nov, pt was to follow up with another nurse visit in 1 week and never showed A: states right leg is slightly swollen with pain,no heat or redness, pt has been on triametrene-hctz 37.5-25 since 2010  R: possible needs bmp or [place] and nurse visit????? Follow-up by: Teressa Lower RN,  April 23, 2010 11:33 AM  Additional Follow-up for Phone Call Additional follow up Details #1::        Reassure her that coumadin does not cause these symptoms.  Suggest visit to PMD for evaluation of these problems.   Additional Follow-up by: Kathlen Brunswick, MD, [facility],  April 26, 2010 4:24 PM

## 2010-06-04 NOTE — Progress Notes (Signed)
Summary: PT RIGHT LEG IS HURTING AND FEET ARE SWOLLEN  Phone Note Call from Patient Call back at Home Phone    Caller: PT Reason for Call: Talk to Nurse Summary of Call: PT IS CALLING BECAUSE HER FEET ARE HURTING AND HER RIGHT LEG IS HURTING FROM BEHIND THE KNEE TO HER FOOT. Initial call taken by: Faythe Ghee,  April 24, 2010 10:34 AM  Follow-up for Phone Call        asked pt to go to ED for acute evaluation of possible DVT, no relief from elevation and rest Follow-up by: Teressa Lower RN,  April 24, 2010 12:04 PM     Appended Document: PT RIGHT LEG IS HURTING AND FEET ARE SWOLLEN 04/24/2010 pt sent to ED for evaluation of acute pain in right leg 04/28/2010 pt in [facility] for embolectomy in right leg

## 2010-06-05 ENCOUNTER — Encounter: Payer: Self-pay | Admitting: Cardiology

## 2010-06-05 ENCOUNTER — Ambulatory Visit: Payer: Self-pay | Admitting: Vascular Surgery

## 2010-06-05 ENCOUNTER — Telehealth: Payer: Self-pay | Admitting: Cardiology

## 2010-06-05 LAB — CONVERTED CEMR LAB: POC INR: 4.9

## 2010-06-08 ENCOUNTER — Ambulatory Visit (HOSPITAL_BASED_OUTPATIENT_CLINIC_OR_DEPARTMENT_OTHER): Payer: Medicare Other | Attending: Internal Medicine

## 2010-06-08 DIAGNOSIS — T8189XA Other complications of procedures, not elsewhere classified, initial encounter: Secondary | ICD-10-CM | POA: Insufficient documentation

## 2010-06-08 DIAGNOSIS — Y838 Other surgical procedures as the cause of abnormal reaction of the patient, or of later complication, without mention of misadventure at the time of the procedure: Secondary | ICD-10-CM | POA: Insufficient documentation

## 2010-06-08 DIAGNOSIS — R609 Edema, unspecified: Secondary | ICD-10-CM | POA: Insufficient documentation

## 2010-06-10 ENCOUNTER — Encounter: Payer: Self-pay | Admitting: Cardiology

## 2010-06-10 ENCOUNTER — Telehealth: Payer: Self-pay | Admitting: Cardiology

## 2010-06-10 NOTE — Progress Notes (Signed)
Summary: coumadin management  Phone Note Other Incoming   Caller: Laney Potash RN [facility] Reason for Call: Discuss lab or test results Summary of Call: Called with results of INR obtained on pt today.  INR 4.9 Hold coumadin tonight and tomorrow night then decrease dose to 6.5mg  once daily except 4mg  on M,W,F and recheck INR on 06/10/10. Initial call taken by: Vashti Hey RN,  June 05, 2010 9:09 AM     Anticoagulant Therapy  Managed by: Vashti Hey, RN PCP: Jackelyn Poling Supervising MD: Myrtis Ser MD, Tinnie Gens Indication 1: Aortic Valve Replacement (ICD-V43.3) Lab Used: [facility] [place] Site: [place] INR POC 4.9  Dietary changes: no    Health status changes: no    Bleeding/hemorrhagic complications: no    Recent/future hospitalizations: no    Any changes in medication regimen? no    Recent/future dental: no  Any missed doses?: no       Is patient compliant with meds? yes         Anticoagulation Management History:      Her anticoagulation is being managed by telephone today.  Positive risk factors for bleeding include an age of 75 years or older and presence of serious comorbidities.  The bleeding index is 'intermediate risk'.  Positive CHADS2 values include History of HTN and Age > 2 years old.  The start date was 04/11/1996.  Anticoagulation responsible provider: Myrtis Ser MD, Tinnie Gens.  INR POC: 4.9.  Exp: 10/11.    Anticoagulation Management Assessment/Plan:      The patient's current anticoagulation dose is Warfarin sodium 5 mg tabs: 1 tablet once daily except 1 1/2 tablets on T,Th,Sat or as directed by anticoagulation clinic.  The target INR is 2 - 3.  The next INR is due 06/10/2010.  Anticoagulation instructions were given to Laney Potash RN [facility].  Results were reviewed/authorized by Vashti Hey, RN.  She was notified by Laney Potash RN [facility].         Prior Anticoagulation Instructions: INR 2.9 Called with results of INR obtained on pt today.   INR 2.9  Pt was discharged to home after being in [facility] post hospitalization for blood clot in leg with embolicetomy.  She came home on 05/29/10 on coumadin 6.5mg  once daily.  She has a 4mg  tablet and a 2.5mg  tablet.  Order given for pt to continue coumadin 6.5mg  once daily and [facility] to recheck INR on 06/05/10.  Current Anticoagulation Instructions: INR 4.9 Hold coumadin tonight and tomorrow night then decrease dose to 6.5mg  once daily except 4mg  on M,W,F

## 2010-06-10 NOTE — Progress Notes (Signed)
Summary: coumadin management  Phone Note Other Incoming   Caller: Laney Potash RN [facility] Reason for Call: Discuss lab or test results Summary of Call: Called with results of INR obtained on pt today.  INR 2.9  Pt was discharged to home after being in [facility] post hospitalization for blood clot in leg with embolicetomy.  She came home on 05/29/10 on coumadin 6.5mg  once daily.  She has a 4mg  tablet and a 2.5mg  tablet.  Order given for pt to continue coumadin 6.5mg  once daily and [facility] to recheck INR on 06/05/10. Initial call taken by: Vashti Hey RN,  June 01, 2010 4:26 PM     Anticoagulant Therapy  Managed by: Vashti Hey, RN PCP: Jackelyn Poling Supervising MD: Dietrich Pates MD, Molly Maduro Indication 1: Aortic Valve Replacement (ICD-V43.3) Lab Used: [facility] [place] Site: [place] INR POC 2.9  Dietary changes: no    Health status changes: yes       Details: blood clot in leg with embolectomy  Bleeding/hemorrhagic complications: no    Recent/future hospitalizations: yes       Details: In hospital for blood clot in leg  Had surgery then went to [facility]  Any changes in medication regimen? yes       Details: D/C home on couamdin 6.5mg  qd   Recent/future dental: no  Any missed doses?: no       Is patient compliant with meds? yes         Anticoagulation Management History:      Her anticoagulation is being managed by telephone today.  Positive risk factors for bleeding include an age of 75 years or older and presence of serious comorbidities.  The bleeding index is 'intermediate risk'.  Positive CHADS2 values include History of HTN and Age > 53 years old.  The start date was 04/11/1996.  Anticoagulation responsible provider: Dietrich Pates MD, Molly Maduro.  INR POC: 2.9.  Exp: 10/11.    Anticoagulation Management Assessment/Plan:      The patient's current anticoagulation dose is Warfarin sodium 5 mg tabs: 1 tablet once daily except 1 1/2 tablets on T,Th,Sat or as  directed by anticoagulation clinic.  The target INR is 2 - 3.  The next INR is due 06/05/2010.  Anticoagulation instructions were given to Laney Potash RN [facility].  Results were reviewed/authorized by Vashti Hey, RN.  She was notified by Laney Potash RN [facility].         Prior Anticoagulation Instructions: INR 1.3 Missed doses Take coumadin 2 tablets tonight then increase dose to 5mg  once daily except 7.5mg  on Tuesdays, Thursdays and Saturdays   Current Anticoagulation Instructions: INR 2.9 Called with results of INR obtained on pt today.  INR 2.9  Pt was discharged to home after being in [facility] post hospitalization for blood clot in leg with embolicetomy.  She came home on 05/29/10 on coumadin 6.5mg  once daily.  She has a 4mg  tablet and a 2.5mg  tablet.  Order given for pt to continue coumadin 6.5mg  once daily and [facility] to recheck INR on 06/05/10.

## 2010-06-12 ENCOUNTER — Encounter: Payer: Self-pay | Admitting: Cardiology

## 2010-06-12 ENCOUNTER — Telehealth: Payer: Self-pay | Admitting: Cardiology

## 2010-06-12 LAB — CONVERTED CEMR LAB: POC INR: 1.7

## 2010-06-15 ENCOUNTER — Encounter (HOSPITAL_BASED_OUTPATIENT_CLINIC_OR_DEPARTMENT_OTHER): Payer: Medicare Other | Attending: Internal Medicine

## 2010-06-15 DIAGNOSIS — Z7982 Long term (current) use of aspirin: Secondary | ICD-10-CM | POA: Insufficient documentation

## 2010-06-15 DIAGNOSIS — I10 Essential (primary) hypertension: Secondary | ICD-10-CM | POA: Insufficient documentation

## 2010-06-15 DIAGNOSIS — Y838 Other surgical procedures as the cause of abnormal reaction of the patient, or of later complication, without mention of misadventure at the time of the procedure: Secondary | ICD-10-CM | POA: Insufficient documentation

## 2010-06-15 DIAGNOSIS — I4891 Unspecified atrial fibrillation: Secondary | ICD-10-CM | POA: Insufficient documentation

## 2010-06-15 DIAGNOSIS — T8189XA Other complications of procedures, not elsewhere classified, initial encounter: Secondary | ICD-10-CM | POA: Insufficient documentation

## 2010-06-15 DIAGNOSIS — I739 Peripheral vascular disease, unspecified: Secondary | ICD-10-CM | POA: Insufficient documentation

## 2010-06-15 DIAGNOSIS — Z4889 Encounter for other specified surgical aftercare: Secondary | ICD-10-CM | POA: Insufficient documentation

## 2010-06-15 DIAGNOSIS — K219 Gastro-esophageal reflux disease without esophagitis: Secondary | ICD-10-CM | POA: Insufficient documentation

## 2010-06-15 DIAGNOSIS — Z7901 Long term (current) use of anticoagulants: Secondary | ICD-10-CM | POA: Insufficient documentation

## 2010-06-17 ENCOUNTER — Telehealth: Payer: Self-pay | Admitting: Cardiology

## 2010-06-17 ENCOUNTER — Encounter: Payer: Self-pay | Admitting: Cardiology

## 2010-06-17 LAB — CONVERTED CEMR LAB: POC INR: 3.4

## 2010-06-18 NOTE — Progress Notes (Signed)
Summary: coumadin management  Phone Note Other Incoming   Caller: Laney Potash RN [facility] Reason for Call: Discuss lab or test results Summary of Call: Called with results of INR obtained on pt today.  INR 1.7  Order given for pt to take coumadin 8mg  tonight then resume 6.5mg  once daily and recheck INR on 06/17/10. Initial call taken by: Vashti Hey RN,  June 12, 2010 1:36 PM     Anticoagulant Therapy  Managed by: Vashti Hey, RN PCP: Jackelyn Poling Supervising MD: Diona Browner MD, Remi Deter Indication 1: Aortic Valve Replacement (ICD-V43.3) Lab Used: [facility] [place] Site: [place] INR POC 1.7  Dietary changes: no    Health status changes: no    Bleeding/hemorrhagic complications: no    Recent/future hospitalizations: no    Any changes in medication regimen? no    Recent/future dental: no  Any missed doses?: no       Is patient compliant with meds? yes         Anticoagulation Management History:      Her anticoagulation is being managed by telephone today.  Positive risk factors for bleeding include an age of 22 years or older and presence of serious comorbidities.  The bleeding index is 'intermediate risk'.  Positive CHADS2 values include History of HTN and Age > 108 years old.  The start date was 04/11/1996.  Anticoagulation responsible provider: Diona Browner MD, Remi Deter.  INR POC: 1.7.  Exp: 10/11.    Anticoagulation Management Assessment/Plan:      The patient's current anticoagulation dose is Warfarin sodium 5 mg tabs: 1 tablet once daily except 1 1/2 tablets on T,Th,Sat or as directed by anticoagulation clinic.  The target INR is 2 - 3.  The next INR is due 06/17/2010.  Anticoagulation instructions were given to Laney Potash RN [facility].  Results were reviewed/authorized by Vashti Hey, RN.  She was notified by Laney Potash RN [facility].         Prior Anticoagulation Instructions: INR 1.5 Called with results of INR obtained on pt today.  INR 1.5  Order  given for pt to increase coumadin to 6.5mg  once daily and recheck INR on 2.10.12  Current Anticoagulation Instructions: INR 1.7 Called with results of INR obtained on pt today.  INR 1.7  Order given for pt to take coumadin 8mg  tonight then resume 6.5mg  once daily and recheck INR on 06/17/10.

## 2010-06-18 NOTE — Progress Notes (Addendum)
Summary: coumadin management  Phone Note Other Incoming   Caller: Laney Potash RN [facility] Reason for Call: Discuss lab or test results Summary of Call: Called with results of INR obtained on pt today.  INR 1.5  Order given for pt to increase coumadin to 6.5mg  once daily and recheck INR on 2.10.12 Initial call taken by: Vashti Hey RN,  June 10, 2010 3:47 PM     Anticoagulant Therapy  Managed by: Vashti Hey, RN PCP: Jackelyn Poling Supervising MD: Dietrich Pates MD, Molly Maduro Indication 1: Aortic Valve Replacement (ICD-V43.3) Lab Used: [facility] [place] Site:  INR POC 1.5  Dietary changes: no    Health status changes: no    Bleeding/hemorrhagic complications: no    Recent/future hospitalizations: no    Any changes in medication regimen? no    Recent/future dental: no  Any missed doses?: no       Is patient compliant with meds? yes         Anticoagulation Management History:      Her anticoagulation is being managed by telephone today.  Positive risk factors for bleeding include an age of 75 years or older and presence of serious comorbidities.  The bleeding index is 'intermediate risk'.  Positive CHADS2 values include History of HTN and Age > 55 years old.  The start date was 04/11/1996.  Anticoagulation responsible provider: Dietrich Pates MD, Molly Maduro.  INR POC: 1.5.  Exp: 10/11.    Anticoagulation Management Assessment/Plan:      The patient's current anticoagulation dose is Warfarin sodium 5 mg tabs: 1 tablet once daily except 1 1/2 tablets on T,Th,Sat or as directed by anticoagulation clinic.  The target INR is 2 - 3.  The next INR is due 06/12/2010.  Anticoagulation instructions were given to Laney Potash RN [facility].  Results were reviewed/authorized by Vashti Hey, RN.  She was notified by Laney Potash RN [facility].         Prior Anticoagulation Instructions: INR 4.9 Hold coumadin tonight and tomorrow night then decrease dose to 6.5mg  once daily  except 4mg  on M,W,F  Current Anticoagulation Instructions: INR 1.5 Called with results of INR obtained on pt today.  INR 1.5  Order given for pt to increase coumadin to 6.5mg  once daily and recheck INR on 2.10.12

## 2010-06-18 NOTE — Consult Note (Signed)
Summary: [place] [facility]   Imported By: Roderic Ovens 06/04/2010 15:44:30  _____________________________________________________________________  External Attachment:    Type:   Image     Comment:   External Document

## 2010-06-22 ENCOUNTER — Encounter: Payer: Self-pay | Admitting: Cardiology

## 2010-06-24 NOTE — Progress Notes (Signed)
Summary: coumadin management  Phone Note Other Incoming   Caller: Laney Potash RN [facility] Reason for Call: Discuss lab or test results Summary of Call: Called with results of INR obtained on pt today.  INR 3.4  Order given for pt to decrease coumadin to 6.5mg  once daily except 4mg  on Wednesdays and recheck INR next week. Initial call taken by: Vashti Hey RN,  June 17, 2010 9:20 AM     Anticoagulant Therapy  Managed by: Vashti Hey, RN PCP: Jackelyn Poling Supervising MD: Diona Browner MD, Remi Deter Indication 1: Aortic Valve Replacement (ICD-V43.3) Lab Used: [facility] [place] Site:  INR POC 3.4  Dietary changes: no    Health status changes: no    Bleeding/hemorrhagic complications: no    Recent/future hospitalizations: no    Any changes in medication regimen? no    Recent/future dental: no  Any missed doses?: no       Is patient compliant with meds? yes         Anticoagulation Management History:      Her anticoagulation is being managed by telephone today.  Positive risk factors for bleeding include an age of 45 years or older and presence of serious comorbidities.  The bleeding index is 'intermediate risk'.  Positive CHADS2 values include History of HTN and Age > 68 years old.  The start date was 04/11/1996.  Anticoagulation responsible provider: Diona Browner MD, Remi Deter.  INR POC: 3.4.  Exp: 10/11.    Anticoagulation Management Assessment/Plan:      The patient's current anticoagulation dose is Warfarin sodium 5 mg tabs: 1 tablet once daily except 1 1/2 tablets on T,Th,Sat or as directed by anticoagulation clinic.  The target INR is 2 - 3.  The next INR is due 06/26/2010.  Anticoagulation instructions were given to Laney Potash RN [facility].  Results were reviewed/authorized by Vashti Hey, RN.  She was notified by Laney Potash RN [facility].         Prior Anticoagulation Instructions: INR 1.7 Called with results of INR obtained on pt today.  INR 1.7   Order given for pt to take coumadin 8mg  tonight then resume 6.5mg  once daily and recheck INR on 06/17/10.  Current Anticoagulation Instructions: INR 3.4 Decrease coumadin to 6.5mg  once daily except 4mg  on Wednesdays

## 2010-06-26 ENCOUNTER — Encounter: Payer: Self-pay | Admitting: Cardiology

## 2010-06-26 ENCOUNTER — Telehealth: Payer: Self-pay | Admitting: Cardiology

## 2010-06-26 LAB — CONVERTED CEMR LAB: POC INR: 3.9

## 2010-06-30 NOTE — Progress Notes (Signed)
Summary: coumadin management  Phone Note Other Incoming   Caller: Laney Potash RN [facility] Reason for Call: Discuss lab or test results Summary of Call: Called with results of INR obtained on pt today.  INR 3.9 Hold coumadin tonight then decrease dose to 6.5mg  once daily except 4mg  on S,T,Th Recheck INR 07/03/10 Initial call taken by: Vashti Hey RN,  June 26, 2010 10:41 AM     Anticoagulant Therapy  Managed by: Vashti Hey, RN PCP: Jackelyn Poling Supervising MD: Diona Browner MD, Remi Deter Indication 1: Aortic Valve Replacement (ICD-V43.3) Lab Used: [facility] [place] Site:  INR POC 3.9  Dietary changes: no    Health status changes: no    Bleeding/hemorrhagic complications: no    Recent/future hospitalizations: no    Any changes in medication regimen? no    Recent/future dental: no  Any missed doses?: no       Is patient compliant with meds? yes         Anticoagulation Management History:      Her anticoagulation is being managed by telephone today.  Positive risk factors for bleeding include an age of 75 years or older and presence of serious comorbidities.  The bleeding index is 'intermediate risk'.  Positive CHADS2 values include History of HTN and Age > 1 years old.  The start date was 04/11/1996.  Anticoagulation responsible provider: Diona Browner MD, Remi Deter.  INR POC: 3.9.  Exp: 10/11.    Anticoagulation Management Assessment/Plan:      The patient's current anticoagulation dose is Warfarin sodium 5 mg tabs: 1 tablet once daily except 1 1/2 tablets on T,Th,Sat or as directed by anticoagulation clinic.  The target INR is 2 - 3.  The next INR is due 07/03/2010.  Anticoagulation instructions were given to Laney Potash RN [facility].  Results were reviewed/authorized by Vashti Hey, RN.  She was notified by Laney Potash RN [facility].         Prior Anticoagulation Instructions: INR 3.4 Decrease coumadin to 6.5mg  once daily except 4mg  on  Wednesdays  Current Anticoagulation Instructions: INR 3.9 Hold coumadin tonight then decrease dose to 6.5mg  once daily except 4mg  on S,T,Th

## 2010-07-02 ENCOUNTER — Telehealth: Payer: Self-pay | Admitting: Cardiology

## 2010-07-02 ENCOUNTER — Encounter: Payer: Self-pay | Admitting: Cardiology

## 2010-07-02 LAB — CONVERTED CEMR LAB: POC INR: 3.2

## 2010-07-06 ENCOUNTER — Encounter (HOSPITAL_BASED_OUTPATIENT_CLINIC_OR_DEPARTMENT_OTHER): Payer: Medicare Other | Attending: Internal Medicine

## 2010-07-06 DIAGNOSIS — I739 Peripheral vascular disease, unspecified: Secondary | ICD-10-CM | POA: Insufficient documentation

## 2010-07-06 DIAGNOSIS — Z4889 Encounter for other specified surgical aftercare: Secondary | ICD-10-CM | POA: Insufficient documentation

## 2010-07-06 DIAGNOSIS — K219 Gastro-esophageal reflux disease without esophagitis: Secondary | ICD-10-CM | POA: Insufficient documentation

## 2010-07-06 DIAGNOSIS — Y838 Other surgical procedures as the cause of abnormal reaction of the patient, or of later complication, without mention of misadventure at the time of the procedure: Secondary | ICD-10-CM | POA: Insufficient documentation

## 2010-07-06 DIAGNOSIS — I10 Essential (primary) hypertension: Secondary | ICD-10-CM | POA: Insufficient documentation

## 2010-07-06 DIAGNOSIS — T8189XA Other complications of procedures, not elsewhere classified, initial encounter: Secondary | ICD-10-CM | POA: Insufficient documentation

## 2010-07-06 DIAGNOSIS — Z7901 Long term (current) use of anticoagulants: Secondary | ICD-10-CM | POA: Insufficient documentation

## 2010-07-06 DIAGNOSIS — Z7982 Long term (current) use of aspirin: Secondary | ICD-10-CM | POA: Insufficient documentation

## 2010-07-06 DIAGNOSIS — I4891 Unspecified atrial fibrillation: Secondary | ICD-10-CM | POA: Insufficient documentation

## 2010-07-09 NOTE — Progress Notes (Signed)
Summary: coumadin management  Phone Note Other Incoming   Caller: Laney Potash RN [facility] Reason for Call: Discuss lab or test results Summary of Call: Called with results of INR obtained on pt today.  INR 3.2  Order given for pt to decrease coumadin to 4mg  once daily except 6.5mg  on M,W,F and recheck INR 07/10/10 Initial call taken by: Vashti Hey RN,  July 02, 2010 2:16 PM     Anticoagulant Therapy  Managed by: Vashti Hey, RN PCP: Jackelyn Poling Supervising MD: Diona Browner MD, Remi Deter Indication 1: Aortic Valve Replacement (ICD-V43.3) Lab Used: [facility] [place] Site: [place] INR POC 3.2  Dietary changes: no    Health status changes: no    Bleeding/hemorrhagic complications: no    Recent/future hospitalizations: no    Any changes in medication regimen? no    Recent/future dental: no  Any missed doses?: no       Is patient compliant with meds? yes         Anticoagulation Management History:      Her anticoagulation is being managed by telephone today.  Positive risk factors for bleeding include an age of 4 years or older and presence of serious comorbidities.  The bleeding index is 'intermediate risk'.  Positive CHADS2 values include History of HTN and Age > 61 years old.  The start date was 04/11/1996.  Anticoagulation responsible provider: Diona Browner MD, Remi Deter.  INR POC: 3.2.  Exp: 10/11.    Anticoagulation Management Assessment/Plan:      The patient's current anticoagulation dose is Warfarin sodium 5 mg tabs: 1 tablet once daily except 1 1/2 tablets on T,Th,Sat or as directed by anticoagulation clinic.  The target INR is 2 - 3.  The next INR is due 07/10/2010.  Anticoagulation instructions were given to Laney Potash RN [facility].  Results were reviewed/authorized by Vashti Hey, RN.  She was notified by Laney Potash RN [facility].         Prior Anticoagulation Instructions: INR 3.9 Hold coumadin tonight then decrease dose to 6.5mg  once daily except 4mg   on S,T,Th  Current Anticoagulation Instructions: INR 3.2 Decrease coumadin to 4mg  once daily except 6.5mg  on M,W,F

## 2010-07-09 NOTE — Miscellaneous (Signed)
Summary: Anticoagulation Management  Home Care ORDER   Imported By: Faythe Ghee 06/22/2010 13:04:57  _____________________________________________________________________  External Attachment:    Type:   Image     Comment:   External Document

## 2010-07-10 ENCOUNTER — Telehealth: Payer: Self-pay | Admitting: Cardiology

## 2010-07-10 ENCOUNTER — Encounter: Payer: Self-pay | Admitting: Cardiology

## 2010-07-13 LAB — BASIC METABOLIC PANEL
BUN: 23 mg/dL (ref 6–23)
BUN: 26 mg/dL — ABNORMAL HIGH (ref 6–23)
BUN: 27 mg/dL — ABNORMAL HIGH (ref 6–23)
BUN: 27 mg/dL — ABNORMAL HIGH (ref 6–23)
BUN: 32 mg/dL — ABNORMAL HIGH (ref 6–23)
Calcium: 8.3 mg/dL — ABNORMAL LOW (ref 8.4–10.5)
Calcium: 8.9 mg/dL (ref 8.4–10.5)
Chloride: 106 mEq/L (ref 96–112)
Chloride: 108 mEq/L (ref 96–112)
Creatinine, Ser: 1.78 mg/dL — ABNORMAL HIGH (ref 0.4–1.2)
Creatinine, Ser: 1.87 mg/dL — ABNORMAL HIGH (ref 0.4–1.2)
GFR calc Af Amer: 25 mL/min — ABNORMAL LOW (ref >60)
GFR calc Af Amer: 35 mL/min — ABNORMAL LOW (ref >60)
GFR calc non Af Amer: 20 mL/min — ABNORMAL LOW (ref >60)
GFR calc non Af Amer: 26 mL/min — ABNORMAL LOW (ref >60)
GFR calc non Af Amer: 27 mL/min — ABNORMAL LOW (ref >60)
GFR calc non Af Amer: 29 mL/min — ABNORMAL LOW (ref >60)
GFR calc non Af Amer: 33 mL/min — ABNORMAL LOW (ref >60)
GFR calc non Af Amer: 35 mL/min — ABNORMAL LOW (ref >60)
Glucose, Bld: 157 mg/dL — ABNORMAL HIGH (ref 70–99)
Glucose, Bld: 99 mg/dL (ref 70–99)
Potassium: 4.1 mEq/L (ref 3.5–5.1)
Potassium: 4.5 mEq/L (ref 3.5–5.1)
Potassium: 5 mEq/L (ref 3.5–5.1)
Sodium: 133 mEq/L — ABNORMAL LOW (ref 135–145)
Sodium: 135 mEq/L (ref 135–145)
Sodium: 136 mEq/L (ref 135–145)
Sodium: 139 mEq/L (ref 135–145)

## 2010-07-13 LAB — PROTIME-INR
INR: 1.02 (ref 0.00–1.49)
INR: 1.11 (ref 0.00–1.49)
INR: 1.2 (ref 0.00–1.49)
INR: 1.23 (ref 0.00–1.49)
INR: 1.35 (ref 0.00–1.49)
INR: 1.94 — ABNORMAL HIGH (ref 0.00–1.49)
INR: 2.18 — ABNORMAL HIGH (ref 0.00–1.49)
Prothrombin Time: 13.7 seconds (ref 11.6–15.2)
Prothrombin Time: 14.5 seconds (ref 11.6–15.2)
Prothrombin Time: 16.9 seconds — ABNORMAL HIGH (ref 11.6–15.2)
Prothrombin Time: 19.4 seconds — ABNORMAL HIGH (ref 11.6–15.2)
Prothrombin Time: 22.3 seconds — ABNORMAL HIGH (ref 11.6–15.2)

## 2010-07-13 LAB — CBC
HCT: 36.7 % (ref 36.0–46.0)
HCT: 24 % — ABNORMAL LOW (ref 36.0–46.0)
HCT: 26.2 % — ABNORMAL LOW (ref 36.0–46.0)
HCT: 28.7 % — ABNORMAL LOW (ref 36.0–46.0)
HCT: 29.4 % — ABNORMAL LOW (ref 36.0–46.0)
HCT: 31.6 % — ABNORMAL LOW (ref 36.0–46.0)
Hemoglobin: 10.3 g/dL — ABNORMAL LOW (ref 12.0–15.0)
Hemoglobin: 10.4 g/dL — ABNORMAL LOW (ref 12.0–15.0)
Hemoglobin: 10.5 g/dL — ABNORMAL LOW (ref 12.0–15.0)
Hemoglobin: 10.6 g/dL — ABNORMAL LOW (ref 12.0–15.0)
Hemoglobin: 8.1 g/dL — ABNORMAL LOW (ref 12.0–15.0)
Hemoglobin: 8.6 g/dL — ABNORMAL LOW (ref 12.0–15.0)
Hemoglobin: 9.6 g/dL — ABNORMAL LOW (ref 12.0–15.0)
MCH: 29.4 pg (ref 26.0–34.0)
MCH: 29.9 pg (ref 26.0–34.0)
MCH: 30.3 pg (ref 26.0–34.0)
MCH: 31 pg (ref 26.0–34.0)
MCHC: 33.8 g/dL (ref 30.0–36.0)
MCHC: 32.8 g/dL (ref 30.0–36.0)
MCHC: 33.1 g/dL (ref 30.0–36.0)
MCHC: 33.2 g/dL (ref 30.0–36.0)
MCHC: 33.4 g/dL (ref 30.0–36.0)
MCHC: 33.4 g/dL (ref 30.0–36.0)
MCHC: 33.8 g/dL (ref 30.0–36.0)
MCV: 89 fL (ref 78.0–100.0)
MCV: 89.6 fL (ref 78.0–100.0)
MCV: 89.7 fL (ref 78.0–100.0)
MCV: 90 fL (ref 78.0–100.0)
MCV: 90.6 fL (ref 78.0–100.0)
MCV: 90.6 fL (ref 78.0–100.0)
MCV: 91.3 fL (ref 78.0–100.0)
Platelets: 154 10*3/uL (ref 150–400)
Platelets: 182 10*3/uL (ref 150–400)
Platelets: 198 10*3/uL (ref 150–400)
Platelets: 247 10*3/uL (ref 150–400)
Platelets: 364 10*3/uL (ref 150–400)
Platelets: 383 10*3/uL (ref 150–400)
RBC: 2.65 MIL/uL — ABNORMAL LOW (ref 3.87–5.11)
RBC: 2.88 MIL/uL — ABNORMAL LOW (ref 3.87–5.11)
RBC: 2.97 MIL/uL — ABNORMAL LOW (ref 3.87–5.11)
RBC: 3.24 MIL/uL — ABNORMAL LOW (ref 3.87–5.11)
RBC: 3.26 MIL/uL — ABNORMAL LOW (ref 3.87–5.11)
RBC: 3.35 MIL/uL — ABNORMAL LOW (ref 3.87–5.11)
RBC: 3.55 MIL/uL — ABNORMAL LOW (ref 3.87–5.11)
RDW: 12.9 % (ref 11.5–15.5)
RDW: 12.8 % (ref 11.5–15.5)
RDW: 14.2 % (ref 11.5–15.5)
RDW: 14.7 % (ref 11.5–15.5)
RDW: 14.9 % (ref 11.5–15.5)
RDW: 15.1 % (ref 11.5–15.5)
RDW: 15.4 % (ref 11.5–15.5)
WBC: 10.3 10*3/uL (ref 4.0–10.5)
WBC: 10.5 10*3/uL (ref 4.0–10.5)
WBC: 10.5 10*3/uL (ref 4.0–10.5)
WBC: 10.6 10*3/uL — ABNORMAL HIGH (ref 4.0–10.5)
WBC: 11.3 10*3/uL — ABNORMAL HIGH (ref 4.0–10.5)
WBC: 8.7 10*3/uL (ref 4.0–10.5)
WBC: 9.4 10*3/uL (ref 4.0–10.5)
WBC: 9.9 10*3/uL (ref 4.0–10.5)

## 2010-07-13 LAB — CARDIAC PANEL(CRET KIN+CKTOT+MB+TROPI)
CK, MB: 3.7 ng/mL (ref 0.3–4.0)
CK, MB: 4.9 ng/mL — ABNORMAL HIGH (ref 0.3–4.0)
CK, MB: 5.1 ng/mL — ABNORMAL HIGH (ref 0.3–4.0)
Relative Index: 2.6 — ABNORMAL HIGH (ref 0.0–2.5)
Total CK: 186 U/L — ABNORMAL HIGH (ref 7–177)
Total CK: 196 U/L — ABNORMAL HIGH (ref 7–177)
Total CK: 202 U/L — ABNORMAL HIGH (ref 7–177)
Total CK: 210 U/L — ABNORMAL HIGH (ref 7–177)
Troponin I: 0.02 ng/mL (ref 0.00–0.06)
Troponin I: 0.03 ng/mL (ref 0.00–0.06)

## 2010-07-13 LAB — COMPREHENSIVE METABOLIC PANEL
AST: 21 U/L (ref 0–37)
CO2: 24 mEq/L (ref 19–32)
Calcium: 8.4 mg/dL (ref 8.4–10.5)
Creatinine, Ser: 2.47 mg/dL — ABNORMAL HIGH (ref 0.4–1.2)
GFR calc Af Amer: 23 mL/min — ABNORMAL LOW (ref >60)
GFR calc non Af Amer: 19 mL/min — ABNORMAL LOW (ref >60)

## 2010-07-13 LAB — MAGNESIUM
Magnesium: 1.4 mg/dL — ABNORMAL LOW (ref 1.5–2.5)
Magnesium: 1.5 mg/dL (ref 1.5–2.5)

## 2010-07-13 LAB — URINE MICROSCOPIC-ADD ON

## 2010-07-13 LAB — PHOSPHORUS: Phosphorus: 4.4 mg/dL (ref 2.3–4.6)

## 2010-07-13 LAB — CROSSMATCH
ABO/RH(D): B POS
ABO/RH(D): B POS
Antibody Screen: NEGATIVE
Unit division: 0
Unit division: 0

## 2010-07-13 LAB — HEPARIN LEVEL (UNFRACTIONATED)
Heparin Unfractionated: 0.2 IU/mL — ABNORMAL LOW (ref 0.30–0.70)
Heparin Unfractionated: 0.3 IU/mL (ref 0.30–0.70)
Heparin Unfractionated: 0.31 IU/mL (ref 0.30–0.70)
Heparin Unfractionated: 0.31 IU/mL (ref 0.30–0.70)
Heparin Unfractionated: 0.32 IU/mL (ref 0.30–0.70)
Heparin Unfractionated: 0.4 IU/mL (ref 0.30–0.70)
Heparin Unfractionated: 0.92 IU/mL — ABNORMAL HIGH (ref 0.30–0.70)
Heparin Unfractionated: 1.01 IU/mL — ABNORMAL HIGH (ref 0.30–0.70)
Heparin Unfractionated: 1.68 IU/mL — ABNORMAL HIGH (ref 0.30–0.70)
Heparin Unfractionated: <0.1 IU/mL — ABNORMAL LOW (ref 0.30–0.70)
Heparin Unfractionated: <0.1 IU/mL — ABNORMAL LOW (ref 0.30–0.70)
Heparin Unfractionated: <0.1 IU/mL — ABNORMAL LOW (ref 0.30–0.70)

## 2010-07-13 LAB — URINALYSIS, ROUTINE W REFLEX MICROSCOPIC
Nitrite: POSITIVE — AB
Specific Gravity, Urine: 1.024 (ref 1.005–1.030)
Urobilinogen, UA: 1 mg/dL (ref 0.0–1.0)
pH: 5.5 (ref 5.0–8.0)

## 2010-07-13 LAB — URINE CULTURE

## 2010-07-13 LAB — DIFFERENTIAL
Basophils Absolute: 0.1 10*3/uL (ref 0.0–0.1)
Basophils Relative: 1 % (ref 0–1)
Eosinophils Relative: 8 % — ABNORMAL HIGH (ref 0–5)
Eosinophils Relative: 3 % (ref 0–5)
Lymphocytes Relative: 24 % (ref 12–46)
Lymphs Abs: 2.7 10*3/uL (ref 0.7–4.0)
Monocytes Absolute: 0.6 10*3/uL (ref 0.1–1.0)
Neutro Abs: 4.5 10*3/uL (ref 1.7–7.7)
Neutro Abs: 7 10*3/uL (ref 1.7–7.7)
Neutrophils Relative %: 63 % (ref 43–77)

## 2010-07-13 LAB — ABO/RH: ABO/RH(D): B POS

## 2010-07-13 LAB — MRSA PCR SCREENING: MRSA by PCR: NEGATIVE

## 2010-07-13 LAB — PROCALCITONIN: Procalcitonin: 0.12 ng/mL

## 2010-07-13 LAB — GLUCOSE, CAPILLARY: Glucose-Capillary: 114 mg/dL — ABNORMAL HIGH (ref 70–99)

## 2010-07-17 ENCOUNTER — Encounter: Payer: Self-pay | Admitting: Cardiology

## 2010-07-17 ENCOUNTER — Telehealth: Payer: Self-pay | Admitting: Cardiology

## 2010-07-21 NOTE — Progress Notes (Signed)
Summary: coumadin management  Phone Note Other Incoming   Caller: Isabel Nancy LPN [facility] Reason for Call: Discuss lab or test results Summary of Call: Called with results of PT/INR obtained on pt today.  PT 46.2  INR 3.9  Order given for pt to hold coumadin tonight then decrease dose to 4mg  once daily except 6.5mg  on Mondays and recheck INR on 07/17/10. Initial call taken by: Vashti Hey RN,  July 10, 2010 10:40 AM     Anticoagulant Therapy  Managed by: Vashti Hey, RN PCP: Jackelyn Poling Supervising MD: Andee Lineman MD, Michelle Piper Indication 1: Aortic Valve Replacement (ICD-V43.3) Lab Used: [facility] Site: [place] PT 46.2 INR POC 3.9  Dietary changes: no    Health status changes: no    Bleeding/hemorrhagic complications: no    Recent/future hospitalizations: no    Any changes in medication regimen? no    Recent/future dental: no  Any missed doses?: no       Is patient compliant with meds? yes         Anticoagulation Management History:      Her anticoagulation is being managed by telephone today.  Positive risk factors for bleeding include an age of 75 years or older and presence of serious comorbidities.  The bleeding index is 'intermediate risk'.  Positive CHADS2 values include History of HTN and Age > 66 years old.  The start date was 04/11/1996.  Prothrombin time is 46.2.  Anticoagulation responsible provider: Andee Lineman MD, Michelle Piper.  INR POC: 3.9.  Exp: 10/11.    Anticoagulation Management Assessment/Plan:      The patient's current anticoagulation dose is Warfarin sodium 5 mg tabs: 1 tablet once daily except 1 1/2 tablets on T,Th,Sat or as directed by anticoagulation clinic.  The target INR is 2 - 3.  The next INR is due 07/17/2010.  Anticoagulation instructions were given to Isabel Nancy LPN [facility].  Results were reviewed/authorized by Vashti Hey, RN.  She was notified by Isabel Nancy LPN [facility].         Prior Anticoagulation Instructions: INR 3.2 Decrease  coumadin to 4mg  once daily except 6.5mg  on M,W,F  Current Anticoagulation Instructions: INR 3.9 Hold coumadin tonight then decrease dose to 4mg  once daily except 6.5mg  on Mondays

## 2010-07-21 NOTE — Progress Notes (Signed)
Summary: coumadin management  Phone Note Other Incoming   Caller: Laney Potash RN [facility] Reason for Call: Discuss lab or test results Summary of Call: Called with results of INR obtained on pt today.  INR 2.0  Order given for pt to continue coumadin 4mg  once daily except 6.5mg  on Mondays and recheck INR on 07/24/10. Initial call taken by: Vashti Hey RN,  July 17, 2010 9:38 AM     Anticoagulant Therapy  Managed by: Vashti Hey, RN PCP: Jackelyn Poling Supervising MD: Andee Lineman MD, Michelle Piper Indication 1: Aortic Valve Replacement (ICD-V43.3) Lab Used: [facility] Site: [place] INR POC 2.0  Dietary changes: no    Health status changes: no    Bleeding/hemorrhagic complications: no    Recent/future hospitalizations: no    Any changes in medication regimen? no    Recent/future dental: no  Any missed doses?: no       Is patient compliant with meds? yes         Anticoagulation Management History:      Her anticoagulation is being managed by telephone today.  Positive risk factors for bleeding include an age of 75 years or older and presence of serious comorbidities.  The bleeding index is 'intermediate risk'.  Positive CHADS2 values include History of HTN and Age > 75 years old.  The start date was 04/11/1996.  Anticoagulation responsible provider: Andee Lineman MD, Michelle Piper.  INR POC: 2.0.  Exp: 10/11.    Anticoagulation Management Assessment/Plan:      The patient's current anticoagulation dose is Warfarin sodium 5 mg tabs: 1 tablet once daily except 1 1/2 tablets on T,Th,Sat or as directed by anticoagulation clinic.  The target INR is 2 - 3.  The next INR is due 07/24/2010.  Anticoagulation instructions were given to Laney Potash RN [facility].  Results were reviewed/authorized by Vashti Hey, RN.  She was notified by Laney Potash RN [facility].         Prior Anticoagulation Instructions: INR 3.9 Hold coumadin tonight then decrease dose to 4mg  once daily except 6.5mg  on  Mondays  Current Anticoagulation Instructions: INR 2.0 Called with results of INR obtained on pt today.  INR 2.0  Order given for pt to continue coumadin 4mg  once daily except 6.5mg  on Mondays and recheck INR on 07/24/10.

## 2010-07-24 ENCOUNTER — Encounter: Payer: Self-pay | Admitting: Cardiology

## 2010-07-24 ENCOUNTER — Encounter: Payer: Self-pay | Admitting: *Deleted

## 2010-07-24 DIAGNOSIS — Z7901 Long term (current) use of anticoagulants: Secondary | ICD-10-CM

## 2010-07-24 DIAGNOSIS — I4891 Unspecified atrial fibrillation: Secondary | ICD-10-CM

## 2010-07-25 ENCOUNTER — Ambulatory Visit: Payer: Self-pay | Admitting: Cardiology

## 2010-07-25 DIAGNOSIS — Z7901 Long term (current) use of anticoagulants: Secondary | ICD-10-CM

## 2010-07-25 DIAGNOSIS — I4891 Unspecified atrial fibrillation: Secondary | ICD-10-CM

## 2010-07-31 ENCOUNTER — Ambulatory Visit (INDEPENDENT_AMBULATORY_CARE_PROVIDER_SITE_OTHER): Payer: Self-pay | Admitting: *Deleted

## 2010-07-31 DIAGNOSIS — Z7901 Long term (current) use of anticoagulants: Secondary | ICD-10-CM

## 2010-07-31 DIAGNOSIS — R0989 Other specified symptoms and signs involving the circulatory and respiratory systems: Secondary | ICD-10-CM

## 2010-07-31 DIAGNOSIS — I4891 Unspecified atrial fibrillation: Secondary | ICD-10-CM

## 2010-08-02 NOTE — Progress Notes (Signed)
This encounter was created in error - please disregard.

## 2010-08-03 ENCOUNTER — Encounter (HOSPITAL_BASED_OUTPATIENT_CLINIC_OR_DEPARTMENT_OTHER): Payer: Medicare Other | Attending: Internal Medicine

## 2010-08-03 DIAGNOSIS — A4901 Methicillin susceptible Staphylococcus aureus infection, unspecified site: Secondary | ICD-10-CM | POA: Insufficient documentation

## 2010-08-03 DIAGNOSIS — T8189XA Other complications of procedures, not elsewhere classified, initial encounter: Secondary | ICD-10-CM | POA: Insufficient documentation

## 2010-08-03 DIAGNOSIS — R609 Edema, unspecified: Secondary | ICD-10-CM | POA: Insufficient documentation

## 2010-08-03 DIAGNOSIS — Y838 Other surgical procedures as the cause of abnormal reaction of the patient, or of later complication, without mention of misadventure at the time of the procedure: Secondary | ICD-10-CM | POA: Insufficient documentation

## 2010-08-05 ENCOUNTER — Ambulatory Visit (INDEPENDENT_AMBULATORY_CARE_PROVIDER_SITE_OTHER): Payer: Medicare Other | Admitting: *Deleted

## 2010-08-05 DIAGNOSIS — Z7901 Long term (current) use of anticoagulants: Secondary | ICD-10-CM

## 2010-08-05 DIAGNOSIS — I4891 Unspecified atrial fibrillation: Secondary | ICD-10-CM

## 2010-08-13 ENCOUNTER — Ambulatory Visit (INDEPENDENT_AMBULATORY_CARE_PROVIDER_SITE_OTHER): Payer: Medicare Other | Admitting: *Deleted

## 2010-08-13 DIAGNOSIS — Z7901 Long term (current) use of anticoagulants: Secondary | ICD-10-CM

## 2010-08-13 DIAGNOSIS — I4891 Unspecified atrial fibrillation: Secondary | ICD-10-CM

## 2010-08-13 LAB — POCT INR: INR: 2.7

## 2010-08-17 ENCOUNTER — Encounter: Payer: Self-pay | Admitting: Cardiology

## 2010-08-17 ENCOUNTER — Ambulatory Visit: Payer: Medicare Other | Admitting: Cardiology

## 2010-08-17 DIAGNOSIS — Z7901 Long term (current) use of anticoagulants: Secondary | ICD-10-CM | POA: Insufficient documentation

## 2010-08-20 ENCOUNTER — Encounter: Payer: Self-pay | Admitting: Adult Health

## 2010-08-20 ENCOUNTER — Ambulatory Visit: Payer: Medicare Other | Admitting: Adult Health

## 2010-08-20 DIAGNOSIS — F17201 Nicotine dependence, unspecified, in remission: Secondary | ICD-10-CM | POA: Insufficient documentation

## 2010-08-26 ENCOUNTER — Ambulatory Visit: Payer: Self-pay | Admitting: *Deleted

## 2010-08-26 DIAGNOSIS — I4891 Unspecified atrial fibrillation: Secondary | ICD-10-CM

## 2010-08-26 DIAGNOSIS — Z7901 Long term (current) use of anticoagulants: Secondary | ICD-10-CM

## 2010-08-28 ENCOUNTER — Encounter: Payer: Self-pay | Admitting: Adult Health

## 2010-08-28 ENCOUNTER — Ambulatory Visit (INDEPENDENT_AMBULATORY_CARE_PROVIDER_SITE_OTHER): Payer: Medicare Other | Admitting: Adult Health

## 2010-08-28 ENCOUNTER — Ambulatory Visit: Payer: Medicare Other | Admitting: Adult Health

## 2010-08-28 DIAGNOSIS — I4891 Unspecified atrial fibrillation: Secondary | ICD-10-CM

## 2010-08-28 DIAGNOSIS — I743 Embolism and thrombosis of arteries of the lower extremities: Secondary | ICD-10-CM

## 2010-08-28 DIAGNOSIS — I749 Embolism and thrombosis of unspecified artery: Secondary | ICD-10-CM

## 2010-08-28 DIAGNOSIS — I10 Essential (primary) hypertension: Secondary | ICD-10-CM

## 2010-08-28 NOTE — Patient Instructions (Signed)
Your physician recommends that you continue on your current medications as directed. Please refer to the Current Medication list given to you today.  Your physician recommends that you schedule a follow-up appointment in: 6 months  

## 2010-08-28 NOTE — Assessment & Plan Note (Signed)
Well controlled at present.  She is continuing with coumadin. Home health nurses are checking PT INR and reporting this to Isabel Hey RN in our coumadin clinic. She will start back with our clinic in office in one month. No medication changes.

## 2010-08-28 NOTE — Assessment & Plan Note (Signed)
She is currently pain free, continues with wound clinic and home health. Remains on coumadin.

## 2010-08-28 NOTE — Progress Notes (Signed)
HPI; [name] returns today for continued assessment of hypertension and atrial fib on coumadin. Since last visit she has been hospitalized seconary to thromboembolectomy of right superficial femoral artery, popliteal  artery, tibioperoneal trunk, anterior tibial and posterior  tibial arteries in the setting of embolic occlusion.  She was treated by Dr. Imogene Burn.  During hospitalization she was seen by Dr. Maylon Cos for Afib and Dr. Johney Frame for bradycardia.  No invasive testing or procedures were performed. She is to be treated medically. She has ongoing care by the wound clinic for wound closure of thrombectomy. She is compliant with her medications as a family member she lives with prepares them for her and gives them to her daily. She has chronic lower extremity edema. She is concerned about a small swollen area over her left outer ankle which is nontender. She is without complaint of chest pain, dizziness, or dyspnea. She is pretty sedentary.  No Known Allergies  Current Outpatient Prescriptions  Medication Sig Dispense Refill  . acetaminophen-codeine (TYLENOL #3) 300-30 MG per tablet Take 1 tablet by mouth every 4 (four) hours as needed.        . ALPRAZolam (XANAX) 1 MG tablet Take 1 mg by mouth at bedtime as needed.        . cloNIDine (CATAPRES) 0.1 MG tablet Take 0.1 mg by mouth 2 (two) times daily.        Marland Kitchen diltiazem (DILACOR XR) 240 MG 24 hr capsule Take 240 mg by mouth daily.        Marland Kitchen docusate sodium (COLACE) 100 MG capsule Take 100 mg by mouth as needed.        . lidocaine (LIDODERM) 5 % Place 1 patch onto the skin daily. Remove & Discard patch within 12 hours or as directed by MD       . lisinopril (PRINIVIL,ZESTRIL) 40 MG tablet Take 40 mg by mouth daily.        Marland Kitchen triamterene-hydrochlorothiazide (MAXZIDE-25) 37.5-25 MG per tablet Take 1 tablet by mouth daily.        Marland Kitchen warfarin (COUMADIN) 2.5 MG tablet Take by mouth as directed.        . warfarin (COUMADIN) 4 MG tablet Take by mouth as  directed.          Past Medical History  Diagnosis Date  . Atrial fibrillation     chronic requiring anticoagulation  . Hypertension   . Renal insufficiency     Stage III; creatinine of 1.7 in 04/2007 and 1.458 2009  . Anemia     Hemoglobin of 11.6 in 01/2010; 12.1 in 08/2010  . Hyperlipidemia     Total cholesterol of 226, triglycerides of 90, HDL of 49 and LDL of 159 in 01/2010  . Chronic anticoagulation   . Tobacco abuse, in remission     quit in 1985; total consumption of 30-40 pack years  . Thrombocytopenia     Past Surgical History  Procedure Date  . Appendectomy   . Partial hysterectomy   . Bunionectomy 1995    YNW:GNFAOZ of systems complete and found to be negative unless listed above PHYSICAL EXAM BP 159/81  Pulse 74  Ht 5\' 4"  (1.626 m)  Wt 151 lb (68.493 kg)  BMI 25.92 kg/m2  SpO2 99% General: Well developed, well nourished, in no acute distress Head: Eyes PERRLA, wears glasses. No xanthomas.   Normal cephalic and atramatic  Lungs: Clear bilaterally to auscultation and percussion. Heart: Irregular heart rhythm, with 1/6 systolic murmur.  Pulses  are 2+ & equal radially..            No carotid bruit. No JVD.  No abdominal bruits. No femoral bruits. Abdomen: Bowel sounds are positive, abdomen soft and non-tender without masses or                  Hernia's noted. Msk:  Back normal, normal gait. Normal strength and tone for age. Extremities: Compression wrap around Right lower leg to the knee.  She is also wearing foot brace for ambulation.                  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  It is not painful or hard to palpation.  Very soft and does not appear to be                   Fluid filled.   Neuro: Alert and oriented X 3. Some memory issues. Psych:  Good affect, responds appropriately   ASSESSMENT AND PLAN

## 2010-08-28 NOTE — Assessment & Plan Note (Signed)
Moderately controlled at present. She is a little agitated having to wait so long to go to bathroom. Once she was finished in bathroom BP was better, down to 140's systolic.  No changes on medications at this time.  I did explain to her that the clonidine and the diltiazem would cause some LEE.  If the swelling in her left lateral ankle became worse or painful she was to report this to her PCP or Dr. Imogene Burn.

## 2010-09-04 ENCOUNTER — Ambulatory Visit (INDEPENDENT_AMBULATORY_CARE_PROVIDER_SITE_OTHER): Payer: Medicare Other | Admitting: Vascular Surgery

## 2010-09-04 DIAGNOSIS — L98499 Non-pressure chronic ulcer of skin of other sites with unspecified severity: Secondary | ICD-10-CM

## 2010-09-07 ENCOUNTER — Encounter (HOSPITAL_BASED_OUTPATIENT_CLINIC_OR_DEPARTMENT_OTHER): Payer: Medicare Other

## 2010-09-07 NOTE — Assessment & Plan Note (Signed)
OFFICE VISIT  [name] DOB:  1929/04/13                                       09/04/2010 ZOXWR#:[id number]  This is an established patient followup.  HISTORY OF PRESENT ILLNESS:  [name] I previously emergently took back to the operating room for right superficial femoral artery, popliteal artery, tibial peroneal trunk, anterior tibial and posterior tibial thrombectomies and four compartment fasciotomies.  She had terrible atherosclerotic disease and her tibial diseases and postoperatively had wound complications of her fasciotomies.  Now she has been undergoing wound care under Dr. Leonie Green directions.  She notes the wounds have recovered significantly but about a few days ago after being started on some over-the-counter ointment started blistering and at this point has some serous drainage.  No fevers or chills are noted.  Past medical history, past surgical history, social history, family history, review of systems, medications, allergies are completely unchanged from her previous visits.  PHYSICAL EXAMINATION:  Vital signs:  Today she had a blood pressure 174/72, heart rate 68, respirations 12, satting 97%, temperature 98.0. General:  Well-developed, well-nourished, no apparent distress, alert and oriented x3.  HEENT:  Pupils were equal, round, reactive to light. Extraocular movements intact.  Conjunctivae normal.  Lungs:  Clear to auscultation bilaterally.  No rales, rhonchi or wheezing.  Cardiac: Irregularly irregular rate and rhythm.  No murmurs, gallops appreciated. Palpable upper extremity pulses, palpable femorals but no pedal pulses. Abdomen:  Soft, abdomen mildly obese, nontender, nondistended.  No guarding, no rebound.  Musculoskeletal:  Upper extremities 5/5 strength as were the lower extremities in the right leg.  The previous fasciotomy wound is completely healed at this point as it is the medial to  complete epithelialization.  There are areas of some skin break down anteriorly with some weeping from blisters that is only serous fluid, no frank purulence, a little bit of erythema along the surface, swelling bilaterally.  Neuro:  No focal weakness or paresthesias.  Skin:  There are no ulcers but it looks like either cellulitis or some type of allergic rash on this right lateral calf.  Lymphatics:  No cervical, axillary or inguinal lymphadenopathy.  MEDICAL DECISION MAKING:  [name] who has for the most part successfully healed her right leg after extensive thromboembolectomy of the right leg likely related to embolization of thrombus to that leg.  Her wound is suggestive either of cellulitis or an allergic reaction to the new ointment applied to this right leg.  I think I would proceed forward with treatment as if this were cellulitis giving a 10 day course of Bactrim DS for presumed MRSA and continue with dry dressings and if that fails to resolve it then at that point I would proceed with like a medium potency steroid cream; as the reversed sequence of interventions may result in increased infection I decided on this course.  She is going to follow up with [place] in 3 weeks.    Fransisco Hertz, MD Electronically Signed  BLC/MEDQ  D:  09/04/2010  T:  09/07/2010  Job:  [id number]

## 2010-09-15 NOTE — Letter (Signed)
December 27, 2007    Annia Friendly. Loleta Chance, MD  [address], [address]  [place], [place]  [address]   RE:  JOJO, GEVING  MRN:  [id number]  /  DOB:  [birth year]   Dear Earvin Hansen,   Ms. Sulton returns to the office for scheduled continued assessment and  treatment of chronic atrial fibrillation and hypertension.  Since her  last visit, she has done quite well.  She reports no urgent medical care  and has not been seen in the emergency department nor in the hospital.  She has good exercise tolerance with no cardiopulmonary symptoms.  She  does not drive, but has daughters in the area.  She is unaware of the  adequacy of her blood pressure control.   CURRENT MEDICATIONS:  1. Warfarin as directed with stable and therapeutic anticoagulation.  2. Toprol 50 mg daily.  3. Triamterene/hydrochlorothiazide 37.5/25 mg daily.  4. Diltiazem 300 mg daily.  5. Lisinopril 20 mg daily.  6. Alprazolam 1 mg at bedtime.  7. Levbid t.i.d.  8. Cardura 2 mg daily.  9. Metoprolol.   PHYSICAL EXAMINATION:  GENERAL:  Talkative and pleasant older woman in  no acute distress.  VITAL SIGNS:  The weight is 151, 5 pounds more than in February.  Blood  pressure 160/85, heart rate 100 and irregular taken apically.  NECK:  No jugular venous distention; no carotid bruits.  LUNGS:  Clear.  CARDIAC:  Normal first and second heart sounds; minimal systolic murmur.  ABDOMEN:  Mildly distended; soft and nontender.  EXTREMITIES:  Trace edema.   Most recent chemistry profile is from July 11, 2007, and shows a  creatinine of 1.45, which has actually improved from recent values.  Her  lipid profile was quite acceptable.  I see no recent CBC.   IMPRESSION:  Ms. Lukic is doing beautifully with longstanding atrial  fibrillation, therapeutic and uncomplicated anticoagulation, but  suboptimal control of hypertension.  We will check a CBC and stool for  Hemoccult testing.  Her dose of diltiazem will be increased to 420 mg  daily for  better control of heart rate and blood pressure.  Cardura will  be increased to 4 mg per day and then further if necessary.  She will  see the cardiology nurse in 2 months and return for a formal office  visit in 6 months.    Sincerely,      Gerrit Friends. Dietrich Pates, MD, [facility]  Electronically Signed    RMR/MedQ  DD: 12/27/2007  DT: 12/28/2007  Job #: [id number]

## 2010-09-15 NOTE — Letter (Signed)
Sep 02, 2008    [name]. Isabel Chance, MD  [address]  [place], [place] [address]   RE:  [name], [name]  MRN:  [id number]  /  DOB:  [birth year]   Dear [name]:   [name] returns to the office for continued assessment and treatment of  hypertension and atrial fibrillation.  Since her last visit, she has  done generally well.  Her daughter takes her blood pressure at times,  but those values are not available to me.  [name] believes that her  pressures have been good.  She has intermittent episodes of weakness.  She has had some headaches that are not well responsive to Tylenol.  She  does not like housework, and her children help her as much as needed.   Medications are unchanged from her last visit except for the addition of  doxazosin 8 mg daily.   PHYSICAL EXAMINATION:  GENERAL:  Pleasant woman in no acute distress.  VITAL SIGNS:  The weight is 150, stable.  Blood pressure 120/70, heart  rate 64 and irregular, respirations 12 and unlabored.  NECK:  No jugular venous distention.  LUNGS:  Clear.  CARDIAC:  Irregular rhythm; normal first and second heart sounds; modest  systolic ejection murmur.  ABDOMEN:  Soft and nontender; no bruits; no organomegaly.  EXTREMITIES:  Trace edema.   IMPRESSION:  [name] is doing quite well overall.  Blood pressure  control is much improved.  Her heart rate in atrial fibrillation is  quite good.  We will obtain a CBC and metabolic profile to  monitor her therapy as well as stool for hemoccult testing.  If results  of those studies are acceptable, I will plan to see this nice woman  again in 7 months.  She will be followed in our Anticoagulation Clinic  in the interim.     Sincerely,      Gerrit Friends. Dietrich Pates, MD, [facility]  Electronically Signed    RMR/MedQ  DD: 09/02/2008  DT: 09/03/2008  Job #: [id number]

## 2010-09-15 NOTE — Letter (Signed)
November 22, 2006    [name]. Loleta Chance, MD  [facility]  [address]  [place], [place]  [address]   RE:  [name], [name]  MRN:  [id number]  /  DOB:  03/07/1929   Dear Earvin Hansen,   [name] returns to the office for continued monitoring of anticoagulant  therapy for chronic atrial fibrillation, treatment of hypertension, and  mild renal insufficiency. Since her last visit, she has done fairly  well. She notes pain in both legs, one related to a fall. This was  clearly not a syncopal spell. The INR is therapeutic today.   Blood pressures have been borderline and is so again today with a value  of 135/90. Her weight is 142, two pounds less than in August of last  year, heart rate 64 and irregular, respirations 18.  NECK: No jugular venous distension, no carotid bruits.  LUNGS: Mild kyphosis; clear lung fields.  CARDIAC: Normal first and second heart sounds; modest scratchy systolic  ejection murmur.  ABDOMEN: Firm and mildly distended; normal bowel sounds; no masses.  EXTREMITIES: Trace edema; distal pulses intact.   Stool for Hemoccult testing was negative in February. He most recent  laboratory includes normal electrolytes and a creatinine of 1.5 in June.  Lipid profile was acceptable in March of this year. Liver function tests  were normal.   IMPRESSION:  [name] is doing fairly well. She will continue to see you  for treatment of her orthopedic issues. I discussed addition of another  antihypertensive medicine to her regimen. She is resistant to this. She  will collect additional blood pressures and come to see me again in 6  months, at which time a CBC and stool for Hemoccult testing will be  repeated.    Sincerely,      Gerrit Friends. Dietrich Pates, MD, [facility]  Electronically Signed    RMR/MedQ  DD: 11/22/2006  DT: 11/23/2006  Job #: [id number]

## 2010-09-15 NOTE — Letter (Signed)
July 04, 2008    Annia Friendly. Loleta Chance, MD  [facility]  [address]  [place], [place]  [address]   RE:  ONDINE, GEMME  MRN:  [id number]  /  DOB:  Sep 14, 1928   Dear Earvin Hansen:   Ms. Calais returns to the office for continued assessment treatment of  hypertension and chronic atrial fibrillation.  Since her last visit, she  has done well from the symptomatic standpoint.  She notes no  palpitations, dyspnea, nor chest discomfort.  She is unaware of whether  blood pressure control has been good.  She has developed some aching and  discomfort in her legs for which she recently gave her muscle relaxants  that have been of benefit to her.   CURRENT MEDICATIONS:  1. Warfarin with dosage controlled from our Coumadin Clinic.  2. Triamterene/HCTZ.  3. Diltiazem, a total of 420 mg daily.  4. Lisinopril 20 mg daily.  5. Alprazolam 1 mg nightly.  6. Librax, generic t.i.d. p.r.n.  7. Cardura 4 mg daily.  8. Metoprolol 25 mg b.i.d.  9. She uses Tylenol p.r.n.   PHYSICAL EXAMINATION:  GENERAL:  Trim, very sharp woman in no acute  distress.  VITAL SIGNS:  The weight is 151, stable.  Blood pressure 165/70, heart  rate 63 and irregular, respirations 13 and unlabored.  NECK:  No jugular venous distention; no carotid bruits.  LUNGS:  Clear.  CARDIAC:  Irregular rhythm; normal first and second heart sounds.  ABDOMEN:  Soft and nontender; no bruits.  EXTREMITIES:  Ankle edema 1/2+.  Distal pulses intact.   No recent laboratory results available to me.  Those records have been  requested from your office.   IMPRESSION:  Ms. Osoria is doing very well with respect to chronic atrial  fibrillation.  We will continue to manage warfarin dosing.  She had  anemia on CBC obtained last year.  If she has not been further evaluated  for this, additional testing is probably warranted.  I will leave that  to your discretion.   Blood pressure control is suboptimal.  We will increase Cardura to 8 mg  daily and lisinopril to 40 mg daily.  Blood pressure will be reassessed  in 2 months.    Sincerely,      Gerrit Friends. Dietrich Pates, MD, [facility]  Electronically Signed    RMR/MedQ  DD: 07/04/2008  DT: 07/05/2008  Job #: [id number]

## 2010-09-15 NOTE — Letter (Signed)
June 19, 2007    [name]. Loleta Chance, MD  [address]  [place], [place] [address]   RE:  Isabel, [name]  MRN:  [id number]  /  DOB:  [birth year]   Dear Earvin Hansen:   [name] returns to the office as scheduled for continued assessment and  treatment of atrial fibrillation requiring anticoagulation and fairly  severe hypertension.  Since her last visit, she has done well from a  symptomatic standpoint.  She reports some chronic fatigue and the need  to use a cane on occasion, but generally good functional status.  She  has had no dyspnea nor chest discomfort.  She has had some hypertension  during nursing visits and has required adjustment of her medical regime.  She was asked to increase clonidine, but subsequently discontinued the  medication entirely unbeknownst to [place].  We found high blood pressure  again and added Cardura 4 mg daily.  Her other medications include  warfarin with stable and therapeutic anticoagulation, Toprol 50 mg  daily, triamterene/HCTZ 37.5/25 mg daily, Diltiazem 90 mg t.i.d.,  lisinopril 20 mg daily, alprazolam 1 mg q.h.s. and Librax b.i.d. plus  p.r.n.   PHYSICAL EXAMINATION:  A diminutive pleasant woman in no acute distress.  The weight is 146, 4 pounds more than 6 months ago.  Blood pressure  145/90, heart rate 110 and irregular, respirations 18.  NECK:  No jugular venous distention; normal carotid upstrokes without  bruits.  LUNGS:  Clear.  CARDIAC:  Irregular rhythm; normal first and second heart sounds; normal  PMI.  ABDOMEN:  Soft and nontender; no masses; no organomegaly.  EXTREMITIES:  No edema.   RHYTHM STRIP:  Atrial fibrillation with a rapid ventricular response.   INR is 2.6.   IMPRESSION:  [name] continues to offer some challenge in maintaining a  therapeutic pharmacologic regime for her hypertension.  She does  surprisingly well with her anticoagulation.  We will change Diltiazem to  Diltiazem CD 300 mg daily, as she reports not being able to  take this  medication reliably three times a day.  She will return in 2 weeks to  see the cardiology nurse and to repeat a rhythm strip.  If blood  pressure and heart rate are well controlled at that time, I will  schedule a formal return office visit in 6 months.    Sincerely,      Gerrit Friends. Dietrich Pates, MD, [facility]  Electronically Signed    RMR/MedQ  DD: 06/19/2007  DT: 06/20/2007  Job #: [id number]

## 2010-09-15 NOTE — Assessment & Plan Note (Signed)
OFFICE VISIT   [name], [name]  DOB:  10/29/1928                                       05/15/2010  ZOXWR#:[id number]   HISTORY OF PRESENT ILLNESS:  This is a 77 year old female who emergently  required a ending thromboembolectomy of the right leg and a 4  compartment fasciotomy done on April 25, 2010.  She successfully  recovered from her embolectomy and then on the date of discharge  developed a tear in the lateral calf adjacent to her staple line.  She  was discharged to nursing home care with plan of wet to dries on that  wound.  Since then, the patient notes that  continued swelling in the  right leg.  Pain is improved from her initial presentation and there are  no fevers or chills noted.   PHYSICAL EXAMINATION:  On physical examination today, she had a blood  pressure of 128/80, a heart rate of 66, respirations were 12, satting  97%.  On focused exam of the right leg the foot is cool with no palpable  pedal pulses but she has full motor.  Plantar flexion is 5/5.  About a  1+ edema is present.  On the medial fasciotomy incision, the staple line  is intact with some sutures still in place.  There is some erythema  around the staples.  There is a small area of skin that is ischemic,  however, there is no drainage here.  The lateral surface, however, the  area of separation has widened since then.  There is some underlying  muscle that is evident, some ischemic skin at this location.  There is  no frank drainage or any erythema.   MEDICAL DECISION MAKING:  This 77 year old female is status post  thromboembolectomy of her the right leg and 4 compartment fasciotomy and  also status post closure of those fasciotomies.  Unfortunately, on  intraoperative angiogram, I proved that this patient has heavily  diseased tibial arteries that are not amenable to any type of surgical  intervention.  Unfortunately in this situation, her best chance of  healing this  wound is wound care.  I would proceed with Santyl ointment  on the lateral open wound to debride any underlying necrotic tissue and  do wet to dry dressing changes to this wound to hopefully healed his  wound by secondary intent as I do not think she has sufficient blood  flow to support a split-thickness skin graft.  Additionally her tissues  and skin are quite fragile, so I do not think it is of adequate  mechanical strength to also support a skin graft.  In terms of the  medial incision, I intend to remove every other staple to see if the  structural integrity is maintained and I would see her back in 1 week to  see if we can remove the remaining staples.  Unfortunately, I am not  surprised with her wound issues, and I had warned the family beforehand  there was a possibility she would not be able to fully heal her wounds  and may require an amputation in the future, but at this point she  appears to be healing actually adequately.  I do not think I would  immediately proceed with any on noninvasive studies.  I would let her  healed his wound completely and then at  point I would do arterial  duplexes and an ABI to follow this wound.  This same problem she has in  terms of peripheral arterial disease in the right leg I suspect her left  leg also has.  So I have left instructions in regards to the wound care  for the nursing home and she is going to follow up with me in 1 week.     Fransisco Hertz, MD  Electronically Signed   BLC/MEDQ  D:  05/15/2010  T:  05/18/2010  Job:  [id number]

## 2010-09-16 ENCOUNTER — Ambulatory Visit (INDEPENDENT_AMBULATORY_CARE_PROVIDER_SITE_OTHER): Payer: Medicare Other | Admitting: *Deleted

## 2010-09-16 DIAGNOSIS — Z7901 Long term (current) use of anticoagulants: Secondary | ICD-10-CM

## 2010-09-16 DIAGNOSIS — I4891 Unspecified atrial fibrillation: Secondary | ICD-10-CM

## 2010-09-18 NOTE — Assessment & Plan Note (Signed)
[facility]                         [facility] OFFICE NOTE   NAME:[name], [name]                        MRN:          [id number]  DATE:12/01/2005                            DOB:          1928-10-04    REFERRING PHYSICIAN:  Annia Friendly. Hill, MD   [name] returns to the office for continued assessment and treatment of  atrial fibrillation, hypertension and renal insufficiency.  Since I last saw  her 18 months ago, she has done quite well.  She is following in the  anticoagulation clinic with stable and therapeutic INRs.  Blood pressure  control has been acceptable.  She has not been hospitalized nor had any  serious medical issues.   CURRENT MEDICATIONS:  1.  Warfarin as directed.  2.  Toprol 50 mg daily.  3.  Triamterene/HCTZ 35.7/25 mg daily.  4.  Diltiazem 90 mg t.i.d.  5.  Lisinopril 20 mg daily.  6.  Alprazolam 1 mg q.h.s.  7.  Librax p.r.n.  (She has not taken her medications today because she knew that one of them  caused excess urination.)   PHYSICAL EXAMINATION:  GENERAL:  A trim, pleasant woman in no acute  distress.  VITAL SIGNS:  Weight is 144, 6 pounds less than in January 2006.  Blood  pressure 150/95, heart rate 100 and irregular, respirations 16.  NECK:  No jugular venous distention.  Normal carotid upstrokes without  bruits.  HEENT:  Pupils equal, round and reactive to light.  Normal oral mucosa.  LUNGS:  Clear.  CARDIAC:  Split first heart sound.  Normal second heart sound.  Modest  systolic ejection murmur at the base.  Normal PMI.  ABDOMEN:  Soft and nontender.  No organomegaly.  No masses.  Normal bowel  sounds.  EXTREMITIES:  1+ ankle edema.  Distal pulses intact.   LABORATORY DATA:  The most recent chemistry profile is from April of this  year.  Creatinine improved from a previous value of 2.2 to 1.5 with a BUN of  30.   IMPRESSION:  [name] is doing generally well.  A CBC and stool for  Hemoccult  testing will be obtained to monitor chronic anticoagulation.  She  is reminded to receive the influenza vaccine, which she does annually.  A  chemistry profile will be obtained due to her use of diuretics and her known  renal insufficiency.  We will continue to monitor blood pressure in the  anticoagulation  clinic.  Lipids were previously obtained and did not require pharmacologic  therapy.  A return office visit will be scheduled in 1 year.                                   Gerrit Friends. Dietrich Pates, MD, [facility]   RMR/MedQ  DD:  12/01/2005  DT:  12/02/2005  Job #:  [id number]   cc:   Annia Friendly. Loleta Chance, MD

## 2010-09-21 ENCOUNTER — Ambulatory Visit (INDEPENDENT_AMBULATORY_CARE_PROVIDER_SITE_OTHER): Payer: Medicare Other | Admitting: *Deleted

## 2010-09-21 DIAGNOSIS — Z7901 Long term (current) use of anticoagulants: Secondary | ICD-10-CM

## 2010-09-21 DIAGNOSIS — I4891 Unspecified atrial fibrillation: Secondary | ICD-10-CM

## 2010-09-21 LAB — POCT INR: INR: 2.3

## 2010-09-24 ENCOUNTER — Encounter: Payer: Medicare Other | Admitting: *Deleted

## 2010-09-25 ENCOUNTER — Ambulatory Visit (INDEPENDENT_AMBULATORY_CARE_PROVIDER_SITE_OTHER): Payer: Medicare Other

## 2010-09-25 ENCOUNTER — Encounter (INDEPENDENT_AMBULATORY_CARE_PROVIDER_SITE_OTHER): Payer: Medicare Other

## 2010-09-25 DIAGNOSIS — I739 Peripheral vascular disease, unspecified: Secondary | ICD-10-CM

## 2010-09-25 NOTE — Assessment & Plan Note (Signed)
OFFICE VISIT  [name], [name] DOB:  07/06/1928                                       09/25/2010 ZOXWR#:[id number]  Dr. Imogene Burn is the attending physician as well as the physician in the office today.  HISTORY OF PRESENT ILLNESS:  The patient is a 77 year old woman who had right femoral to tibial embolectomies and four compartment fasciotomies on 04/25/2010 who returns today for her repeat ABIs and followup of her wounds on the right calf.  The patient's daughter states that the patient continues to have this itchy, scaly rash where she gets small reddened bumps which are raised and then they blister and then finally heal.  She has had this issue since postop and has been treated with Bactrim for possible MRSA skin and also with some "balm" by the daughter on the scaly skin.  She continues to have these pruritic small bumps that blister despite conservative treatment.  ABIs showed noncompressible PT and DP bilaterally with digits at 0.59 on the right and 0.64 on the left.  PHYSICAL EXAM:  This is an elderly woman in no acute distress who is walking with a walker.  She has a palpable DP pulse on the right but decreased sensation in the foot that has been there since surgery. There is no change in the neurological status.  Right lower extremity, her fasciotomy wounds are well-healed.  She has scaly skin type areas with areas of closed raised bumps just distal to the patella.  She also has some open blister type areas among the patches of the scaly skin. Otherwise the foot is warm and pink.  ASSESSMENT: 1. Well-perfused right lower extremity with calcified vessels status     post right femoral-tibial embolectomy approximately 6 months ago. 2. She continues to have a pruritic rash, possible eczema post     surgical procedure in the right lower extremity on the shin and     medial and lateral calf.  PLAN:  Dr. Imogene Burn was asked to see the patient and felt that  she had to be seen by dermatology for further treatment for the skin changes in the right lower extremity as antibiotics and conservative treatment have been unsuccessful at this point.  The patient was referred to [facility].  Della Goo, PA-C  Fransisco Hertz, MD Electronically Signed  RR/MEDQ  D:  09/25/2010  T:  09/25/2010  Job:  [id number]  cc:   [facility]

## 2010-10-12 ENCOUNTER — Ambulatory Visit (INDEPENDENT_AMBULATORY_CARE_PROVIDER_SITE_OTHER): Payer: Medicare Other | Admitting: *Deleted

## 2010-10-12 DIAGNOSIS — Z7901 Long term (current) use of anticoagulants: Secondary | ICD-10-CM

## 2010-10-12 DIAGNOSIS — I4891 Unspecified atrial fibrillation: Secondary | ICD-10-CM

## 2010-10-12 LAB — POCT INR: INR: 3.3

## 2010-10-19 ENCOUNTER — Ambulatory Visit (INDEPENDENT_AMBULATORY_CARE_PROVIDER_SITE_OTHER): Payer: Medicare Other | Admitting: *Deleted

## 2010-10-19 DIAGNOSIS — Z7901 Long term (current) use of anticoagulants: Secondary | ICD-10-CM

## 2010-10-19 DIAGNOSIS — I4891 Unspecified atrial fibrillation: Secondary | ICD-10-CM

## 2010-11-16 ENCOUNTER — Ambulatory Visit (INDEPENDENT_AMBULATORY_CARE_PROVIDER_SITE_OTHER): Payer: Medicare Other | Admitting: *Deleted

## 2010-11-16 DIAGNOSIS — I4891 Unspecified atrial fibrillation: Secondary | ICD-10-CM

## 2010-11-16 DIAGNOSIS — Z7901 Long term (current) use of anticoagulants: Secondary | ICD-10-CM

## 2010-12-14 ENCOUNTER — Ambulatory Visit (INDEPENDENT_AMBULATORY_CARE_PROVIDER_SITE_OTHER): Payer: Medicare Other | Admitting: *Deleted

## 2010-12-14 DIAGNOSIS — I4891 Unspecified atrial fibrillation: Secondary | ICD-10-CM

## 2010-12-14 DIAGNOSIS — Z7901 Long term (current) use of anticoagulants: Secondary | ICD-10-CM

## 2011-01-11 ENCOUNTER — Encounter: Payer: Self-pay | Admitting: *Deleted

## 2011-01-11 ENCOUNTER — Encounter: Payer: Medicare Other | Admitting: *Deleted

## 2011-01-18 ENCOUNTER — Ambulatory Visit (INDEPENDENT_AMBULATORY_CARE_PROVIDER_SITE_OTHER): Payer: Medicare Other | Admitting: *Deleted

## 2011-01-18 DIAGNOSIS — Z7901 Long term (current) use of anticoagulants: Secondary | ICD-10-CM

## 2011-01-18 DIAGNOSIS — I4891 Unspecified atrial fibrillation: Secondary | ICD-10-CM

## 2011-01-18 LAB — POCT INR: INR: 2.5

## 2011-02-15 ENCOUNTER — Ambulatory Visit (INDEPENDENT_AMBULATORY_CARE_PROVIDER_SITE_OTHER): Payer: Medicare Other | Admitting: *Deleted

## 2011-02-15 DIAGNOSIS — Z7901 Long term (current) use of anticoagulants: Secondary | ICD-10-CM

## 2011-02-15 DIAGNOSIS — I4891 Unspecified atrial fibrillation: Secondary | ICD-10-CM

## 2011-03-02 ENCOUNTER — Other Ambulatory Visit: Payer: Self-pay | Admitting: *Deleted

## 2011-03-02 MED ORDER — WARFARIN SODIUM 4 MG PO TABS: 4.0000 mg | ORAL_TABLET | ORAL | Status: DC

## 2011-03-29 ENCOUNTER — Ambulatory Visit (INDEPENDENT_AMBULATORY_CARE_PROVIDER_SITE_OTHER): Payer: Medicare Other | Admitting: *Deleted

## 2011-03-29 DIAGNOSIS — I4891 Unspecified atrial fibrillation: Secondary | ICD-10-CM

## 2011-03-29 DIAGNOSIS — I749 Embolism and thrombosis of unspecified artery: Secondary | ICD-10-CM

## 2011-03-29 DIAGNOSIS — Z7901 Long term (current) use of anticoagulants: Secondary | ICD-10-CM

## 2011-03-29 DIAGNOSIS — I743 Embolism and thrombosis of arteries of the lower extremities: Secondary | ICD-10-CM

## 2011-03-29 LAB — POCT INR: INR: 2.3

## 2011-04-02 ENCOUNTER — Encounter (INDEPENDENT_AMBULATORY_CARE_PROVIDER_SITE_OTHER): Payer: Medicare Other | Admitting: Vascular Surgery

## 2011-04-02 DIAGNOSIS — Z48812 Encounter for surgical aftercare following surgery on the circulatory system: Secondary | ICD-10-CM

## 2011-04-02 DIAGNOSIS — I743 Embolism and thrombosis of arteries of the lower extremities: Secondary | ICD-10-CM

## 2011-04-02 DIAGNOSIS — I739 Peripheral vascular disease, unspecified: Secondary | ICD-10-CM

## 2011-05-10 ENCOUNTER — Ambulatory Visit (INDEPENDENT_AMBULATORY_CARE_PROVIDER_SITE_OTHER): Payer: Medicare Other | Admitting: *Deleted

## 2011-05-10 DIAGNOSIS — I4891 Unspecified atrial fibrillation: Secondary | ICD-10-CM

## 2011-05-10 DIAGNOSIS — I743 Embolism and thrombosis of arteries of the lower extremities: Secondary | ICD-10-CM | POA: Diagnosis not present

## 2011-05-10 DIAGNOSIS — I749 Embolism and thrombosis of unspecified artery: Secondary | ICD-10-CM

## 2011-05-10 DIAGNOSIS — Z7901 Long term (current) use of anticoagulants: Secondary | ICD-10-CM | POA: Diagnosis not present

## 2011-05-10 LAB — POCT INR: INR: 2.5

## 2011-06-09 DIAGNOSIS — I10 Essential (primary) hypertension: Secondary | ICD-10-CM | POA: Diagnosis not present

## 2011-06-09 DIAGNOSIS — Z0000 Encounter for general adult medical examination without abnormal findings: Secondary | ICD-10-CM | POA: Diagnosis not present

## 2011-06-21 ENCOUNTER — Ambulatory Visit (INDEPENDENT_AMBULATORY_CARE_PROVIDER_SITE_OTHER): Payer: Medicare Other | Admitting: *Deleted

## 2011-06-21 DIAGNOSIS — Z7901 Long term (current) use of anticoagulants: Secondary | ICD-10-CM | POA: Diagnosis not present

## 2011-06-21 DIAGNOSIS — I4891 Unspecified atrial fibrillation: Secondary | ICD-10-CM

## 2011-06-21 DIAGNOSIS — I743 Embolism and thrombosis of arteries of the lower extremities: Secondary | ICD-10-CM | POA: Diagnosis not present

## 2011-06-21 DIAGNOSIS — I749 Embolism and thrombosis of unspecified artery: Secondary | ICD-10-CM

## 2011-06-21 LAB — POCT INR: INR: 2.8

## 2011-07-28 ENCOUNTER — Other Ambulatory Visit: Payer: Self-pay | Admitting: *Deleted

## 2011-07-28 MED ORDER — WARFARIN SODIUM 4 MG PO TABS: 4.0000 mg | ORAL_TABLET | ORAL | Status: DC

## 2011-07-28 MED ORDER — WARFARIN SODIUM 2.5 MG PO TABS: 2.5000 mg | ORAL_TABLET | ORAL | Status: DC

## 2011-08-02 ENCOUNTER — Ambulatory Visit (INDEPENDENT_AMBULATORY_CARE_PROVIDER_SITE_OTHER): Payer: Medicare Other | Admitting: *Deleted

## 2011-08-02 ENCOUNTER — Other Ambulatory Visit: Payer: Self-pay | Admitting: *Deleted

## 2011-08-02 DIAGNOSIS — I743 Embolism and thrombosis of arteries of the lower extremities: Secondary | ICD-10-CM | POA: Diagnosis not present

## 2011-08-02 DIAGNOSIS — Z7901 Long term (current) use of anticoagulants: Secondary | ICD-10-CM | POA: Diagnosis not present

## 2011-08-02 DIAGNOSIS — I4891 Unspecified atrial fibrillation: Secondary | ICD-10-CM | POA: Diagnosis not present

## 2011-08-02 DIAGNOSIS — I749 Embolism and thrombosis of unspecified artery: Secondary | ICD-10-CM

## 2011-08-02 LAB — POCT INR: INR: 2.1

## 2011-08-02 MED ORDER — WARFARIN SODIUM 2.5 MG PO TABS: 2.5000 mg | ORAL_TABLET | ORAL | Status: DC

## 2011-09-06 ENCOUNTER — Ambulatory Visit (INDEPENDENT_AMBULATORY_CARE_PROVIDER_SITE_OTHER): Payer: Medicare Other | Admitting: *Deleted

## 2011-09-06 DIAGNOSIS — I4891 Unspecified atrial fibrillation: Secondary | ICD-10-CM | POA: Diagnosis not present

## 2011-09-06 DIAGNOSIS — I743 Embolism and thrombosis of arteries of the lower extremities: Secondary | ICD-10-CM | POA: Diagnosis not present

## 2011-09-06 DIAGNOSIS — Z7901 Long term (current) use of anticoagulants: Secondary | ICD-10-CM

## 2011-09-06 DIAGNOSIS — I749 Embolism and thrombosis of unspecified artery: Secondary | ICD-10-CM

## 2011-09-27 ENCOUNTER — Encounter: Payer: Self-pay | Admitting: Cardiology

## 2011-09-27 DIAGNOSIS — N189 Chronic kidney disease, unspecified: Secondary | ICD-10-CM | POA: Insufficient documentation

## 2011-09-29 ENCOUNTER — Encounter: Payer: Self-pay | Admitting: Cardiology

## 2011-09-29 ENCOUNTER — Ambulatory Visit (INDEPENDENT_AMBULATORY_CARE_PROVIDER_SITE_OTHER): Payer: Medicare Other | Admitting: *Deleted

## 2011-09-29 ENCOUNTER — Ambulatory Visit (INDEPENDENT_AMBULATORY_CARE_PROVIDER_SITE_OTHER): Payer: Medicare Other | Admitting: Cardiology

## 2011-09-29 VITALS — BP 204/95 | HR 72 | Resp 16 | Ht 64.0 in | Wt 162.0 lb

## 2011-09-29 DIAGNOSIS — I10 Essential (primary) hypertension: Secondary | ICD-10-CM | POA: Diagnosis not present

## 2011-09-29 DIAGNOSIS — I743 Embolism and thrombosis of arteries of the lower extremities: Secondary | ICD-10-CM | POA: Diagnosis not present

## 2011-09-29 DIAGNOSIS — I4891 Unspecified atrial fibrillation: Secondary | ICD-10-CM | POA: Diagnosis not present

## 2011-09-29 DIAGNOSIS — N189 Chronic kidney disease, unspecified: Secondary | ICD-10-CM | POA: Diagnosis not present

## 2011-09-29 DIAGNOSIS — D649 Anemia, unspecified: Secondary | ICD-10-CM

## 2011-09-29 DIAGNOSIS — I739 Peripheral vascular disease, unspecified: Secondary | ICD-10-CM

## 2011-09-29 DIAGNOSIS — Z7901 Long term (current) use of anticoagulants: Secondary | ICD-10-CM | POA: Diagnosis not present

## 2011-09-29 DIAGNOSIS — I749 Embolism and thrombosis of unspecified artery: Secondary | ICD-10-CM

## 2011-09-29 LAB — POCT INR: INR: 2

## 2011-09-29 MED ORDER — VERAPAMIL HCL ER 120 MG PO TBCR: 120.0000 mg | EXTENDED_RELEASE_TABLET | Freq: Every day | ORAL | Status: DC

## 2011-09-29 MED ORDER — DILTIAZEM HCL ER 120 MG PO CP24: 120.0000 mg | ORAL_CAPSULE | Freq: Every day | ORAL | Status: DC

## 2011-09-29 MED ORDER — LABETALOL HCL 200 MG PO TABS: 200.0000 mg | ORAL_TABLET | Freq: Two times a day (BID) | ORAL | Status: DC

## 2011-09-29 MED ORDER — CHLORTHALIDONE 25 MG PO TABS: 12.5000 mg | ORAL_TABLET | Freq: Every day | ORAL | Status: DC

## 2011-09-29 MED ORDER — CLONIDINE HCL 0.2 MG/24HR TD PTWK: 1.0000 | MEDICATED_PATCH | TRANSDERMAL | Status: DC

## 2011-09-29 MED ORDER — FUROSEMIDE 20 MG PO TABS: 20.0000 mg | ORAL_TABLET | Freq: Every day | ORAL | Status: DC

## 2011-09-29 MED ORDER — CLONIDINE HCL 0.2 MG PO TABS: 0.2000 mg | ORAL_TABLET | Freq: Two times a day (BID) | ORAL | Status: DC

## 2011-09-29 NOTE — Assessment & Plan Note (Signed)
Significant obstructive disease in the lower extremities; however, patient's lifestyle is sedentary, and intervention is not warranted.

## 2011-09-29 NOTE — Progress Notes (Signed)
Patient ID: [name], female   DOB: 03/26/1929, 78 y.o.   MRN: [id number]  HPI: Scheduled a return visit for this pleasant 78 year old woman with a history of atrial fibrillation and hypertension and more recently deep vein thrombosis and postphlebitic syndrome.  She suffers from urinary frequency and incontinence.  She is concerned about suffering another fall, but has not done so.  She lives with her son and receives considerable assistance from him and from 2 daughters who also reside locally.  Her diet appears to be salt restricted, but she has trouble maintaining leg elevation during the day and tolerating compression stockings.  Prior to Admission medications   Medication Sig Start Date End Date Taking? Authorizing Provider  acetaminophen-codeine (TYLENOL #3) 300-30 MG per tablet Take 1 tablet by mouth every 4 (four) hours as needed.     Yes Historical Provider, MD  ALPRAZolam Prudy Feeler) 1 MG tablet Take 1 mg by mouth at bedtime as needed.     Yes Historical Provider, MD  cloNIDine (CATAPRES) 0.1 MG tablet Take 0.1 mg by mouth 2 (two) times daily.     Yes Historical Provider, MD  diltiazem (DILACOR XR) 240 MG 24 hr capsule Take 240 mg by mouth daily.     Yes Historical Provider, MD  docusate sodium (COLACE) 100 MG capsule Take 100 mg by mouth as needed.     Yes Historical Provider, MD  lisinopril (PRINIVIL,ZESTRIL) 40 MG tablet Take 40 mg by mouth daily.     Yes Historical Provider, MD  warfarin (COUMADIN) 2.5 MG tablet Take 1 tablet (2.5 mg total) by mouth as directed. 08/02/11  Yes Kathlen Brunswick, MD  warfarin (COUMADIN) 4 MG tablet Take 1 tablet (4 mg total) by mouth as directed. 07/28/11  Yes Kathlen Brunswick, MD   No Known Allergies    Past medical history, social history, and family history reviewed and updated.  ROS: Denies orthopnea, PND, palpitations, lightheadedness or syncope.  Caloric intake has been increased of late with weight gain.  She consumes a fair amount of fluid over  the course of the day including in the evening following supper.  All other systems reviewed and are negative.  PHYSICAL EXAM: BP 204/95  Pulse 72  Resp 16  Ht 5\' 4"  (1.626 m)  Wt 73.483 kg (162 lb)  BMI 27.81 kg/m2  General-Well developed; no acute distress; feisty Body habitus-proportionate weight and height Neck-No JVD; no carotid bruits Lungs-clear lung fields; resonant to percussion Cardiovascular-normal PMI; normal S1 and S2; irregular rhythm; modest systolic murmur Abdomen-normal bowel sounds; soft and non-tender without masses or organomegaly Musculoskeletal-No deformities, no cyanosis or clubbing Neurologic-Normal cranial nerves; symmetric strength and tone Skin-Warm, no significant lesions Extremities-distal pulses intact; 1-2+ edema to the knees; Substantial swelling over the medial aspect of the right knee with some tenderness to palpation  ASSESSMENT AND PLAN:  [name], MD 09/29/2011 3:13 PM

## 2011-09-29 NOTE — Assessment & Plan Note (Addendum)
Anemia had resolved as of the most recent CBC obtained in 03/2011.  A repeat CBC will be obtained.

## 2011-09-29 NOTE — Progress Notes (Signed)
Upon discharging patient, caregiver brought to my attention that the medications that were discussed when she was checked in were not correct, therefore asked her to please bring in bottles of each medication that the patient is taking so that Dr Dietrich Pates can safely manage her medication regimen.  Caregiver returned with bottles and was only taking metoprolol 50 mg bid, Amlodipine 5 mg daily, Aspirin 81 mg daily (cardiology medications)  Dr Dietrich Pates made aware and plan changed accordingly.  [facility] Pharmacist contacted in order to reconcile medications.  Instructions given to caregiver and she verbalized understanding.

## 2011-09-29 NOTE — Progress Notes (Deleted)
Name: [name]    DOB: June 22, 1928  Age: 78 y.o.  MR#: [id number]       PCP:  Evlyn Courier, MD, MD      Insurance: @PAYORNAME @   CC:    Chief Complaint  Patient presents with  . Appointment    no complaints -meds/list    VS BP 204/95  Pulse 72  Resp 16  Ht 5\' 4"  (1.626 m)  Wt 162 lb (73.483 kg)  BMI 27.81 kg/m2  Weights Current Weight  09/29/11 162 lb (73.483 kg)  08/28/10 151 lb (68.493 kg)  03/05/10 142 lb (64.411 kg)    Blood Pressure  BP Readings from Last 3 Encounters:  09/29/11 204/95  08/28/10 159/81  03/05/10 165/81     Admit date:  (Not on file) Last encounter with RMR:  09/27/2011   Allergy No Known Allergies  Current Outpatient Prescriptions  Medication Sig Dispense Refill  . acetaminophen-codeine (TYLENOL #3) 300-30 MG per tablet Take 1 tablet by mouth every 4 (four) hours as needed.        . ALPRAZolam (XANAX) 1 MG tablet Take 1 mg by mouth at bedtime as needed.        . cloNIDine (CATAPRES) 0.1 MG tablet Take 0.1 mg by mouth 2 (two) times daily.        Marland Kitchen diltiazem (DILACOR XR) 240 MG 24 hr capsule Take 240 mg by mouth daily.        Marland Kitchen docusate sodium (COLACE) 100 MG capsule Take 100 mg by mouth as needed.        Marland Kitchen lisinopril (PRINIVIL,ZESTRIL) 40 MG tablet Take 40 mg by mouth daily.        Marland Kitchen triamterene-hydrochlorothiazide (MAXZIDE-25) 37.5-25 MG per tablet Take 1 tablet by mouth daily.        Marland Kitchen warfarin (COUMADIN) 2.5 MG tablet Take 1 tablet (2.5 mg total) by mouth as directed.  60 tablet  1  . warfarin (COUMADIN) 4 MG tablet Take 1 tablet (4 mg total) by mouth as directed.  60 tablet  3    Discontinued Meds:    Medications Discontinued During This Encounter  Medication Reason  . lidocaine (LIDODERM) 5 % Error    Patient Active Problem List  Diagnoses  . Hyperlipidemia  . ANEMIA  . HYPERTENSION  . ATRIAL FIBRILLATION, CHRONIC  . Chronic anticoagulation  . Tobacco abuse, in remission  . 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  Chronic kidney disease    LABS Anti-coag visit on 09/06/2011  Component Date Value  . INR 09/06/2011 1.8   Anti-coag visit on 08/02/2011  Component Date Value  . INR 08/02/2011 2.1      Results for this Opt Visit:     Results for orders placed in visit on 09/06/11  POCT INR      Component Value Range   INR 1.8      EKG Orders placed in visit on 06/24/09  . CONVERTED CEMR EKG     Prior Assessment and Plan Problem List as of 09/29/2011          Cardiology Problems   Hyperlipidemia   HYPERTENSION   Last Assessment & Plan Note   08/28/2010 Office Visit Signed 08/28/2010  5:43 PM by Jodelle Gross, NP    Moderately controlled at present. She is a little agitated having to wait so long to go to bathroom. Once she was finished in bathroom BP was better, down to 140's systolic.  No changes on medications  at this time.  I did explain to her that the clonidine and the diltiazem would cause some LEE.  If the swelling in her left lateral ankle became worse or painful she was to report this to her PCP or Dr. Imogene Burn.    ATRIAL FIBRILLATION, CHRONIC   Last Assessment & Plan Note   08/28/2010 Office Visit Signed 08/28/2010  5:41 PM by Jodelle Gross, NP    Well controlled at present.  She is continuing with coumadin. Home health nurses are checking PT INR and reporting this to Vashti Hey RN in our coumadin clinic. She will start back with our clinic in office in one month. No medication changes.      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 Remains on coumadin.    Chronic kidney disease       Imaging: No results found.   FRS Calculation: Score not calculated

## 2011-09-29 NOTE — Assessment & Plan Note (Signed)
Mild to moderate chronic kidney disease has been very stable with creatinine ranging from 1.4-1.7 over the past 5 years.  We will continue to monitor.

## 2011-09-29 NOTE — Patient Instructions (Addendum)
Your physician recommends that you schedule a follow-up appointment in:  1 - 1 month with Dr Dietrich Pates 2 - 2 week blood pressure check  Your physician has requested that you regularly monitor and record your blood pressure readings at home. Please use the same machine at the same time of day to check your readings and record them to bring to your follow-up blood pressure visit.  Knee high compression stockings   NO fluid consumption after evening meal  Your physician has recommended you make the following change in your medication:  1 - STOP Metoprolol 2 - STOP Amlodipine 3 - START Labetalol 200 mg twice daily 4 - START Verapamil 120 mg daily 5 - START Clonidine 0.2 mg patch to skin weekly

## 2011-09-29 NOTE — Assessment & Plan Note (Signed)
Atrial fibrillation continues to cause no symptoms and no apparent adverse consequences.  Patient is committed to lifelong anticoagulation and has done well on warfarin.  INRs have been almost invariably stable and therapeutic.  Stool for Hemoccult testing is pending.  I have no record of previous colonoscopy-Dr. Loleta Chance will determine if this test is warranted.

## 2011-10-05 ENCOUNTER — Telehealth: Payer: Self-pay | Admitting: Cardiology

## 2011-10-05 NOTE — Telephone Encounter (Signed)
DAUGHTER BROUGHT STOCKING THEY HAD ALREADY IN 10/05/11 DR ROTHBART DID TAKE A LOOK AT THEM AND THEY ARE NOT WHAT HE WAS WANTING FOR HER.   NEED TO CALL RX IN FOR THEM.  SHE ALSO STATED THAT THEY CAN NOT AFFORD THEY NEW RX FOR A PATCH (OVER $100). CAN SHE GIVE HER A PILL INSTEAD? SHE STATES THE SHE IS THE ONE TO DISPENCE ALL MEDICATIONS AND WILL BE ABLE TO MAKE SURE SHE TAKES THE CORRECTLY.

## 2011-10-07 ENCOUNTER — Other Ambulatory Visit: Payer: Self-pay | Admitting: *Deleted

## 2011-10-07 MED ORDER — CLONIDINE HCL 0.2 MG PO TABS: 0.2000 mg | ORAL_TABLET | Freq: Two times a day (BID) | ORAL | Status: DC

## 2011-10-07 NOTE — Telephone Encounter (Signed)
Spoke with caregiver regarding medication regimen and compression stockings.  Script sent to pharmacy for clonidine 0.2 mg bid and called in script for stockings to [facility].

## 2011-10-07 NOTE — Telephone Encounter (Signed)
Message left for return call regarding medicatoin regimen

## 2011-10-13 ENCOUNTER — Ambulatory Visit (INDEPENDENT_AMBULATORY_CARE_PROVIDER_SITE_OTHER): Payer: Medicare Other | Admitting: *Deleted

## 2011-10-13 VITALS — BP 125/57 | HR 55 | Ht 64.0 in | Wt 157.0 lb

## 2011-10-13 DIAGNOSIS — R0989 Other specified symptoms and signs involving the circulatory and respiratory systems: Secondary | ICD-10-CM

## 2011-10-13 DIAGNOSIS — Z7901 Long term (current) use of anticoagulants: Secondary | ICD-10-CM | POA: Diagnosis not present

## 2011-10-13 DIAGNOSIS — I743 Embolism and thrombosis of arteries of the lower extremities: Secondary | ICD-10-CM

## 2011-10-13 DIAGNOSIS — I4891 Unspecified atrial fibrillation: Secondary | ICD-10-CM | POA: Diagnosis not present

## 2011-10-13 DIAGNOSIS — I10 Essential (primary) hypertension: Secondary | ICD-10-CM | POA: Diagnosis not present

## 2011-10-13 DIAGNOSIS — I749 Embolism and thrombosis of unspecified artery: Secondary | ICD-10-CM

## 2011-10-13 LAB — POCT INR: INR: 3.4

## 2011-10-14 NOTE — Progress Notes (Signed)
Presents for follow up for hypertension and review of LE extremity edema.  States that she is not taking clonidine due to the fact she feels is is causing her to be sleepy.  Edema is markedly improved since last visit.  Has been utilizing support stockings, rather than the prescribed support hose, however I assessed the hose and there is adequate compression.  Medication bottles reviewed.

## 2011-10-17 NOTE — Progress Notes (Signed)
Patient ID: [name], female   DOB: 19-Aug-1928, 78 y.o.   MRN: [id number]  Blood pressure log from 10/2011:12 determinations with systolics of 107-164 and all diastolics less than 80.  In half of the measurements systolic succeeded 145 mmHg.  Increase chlorthalidone to 25 mg per day Increase labetalol to 300 mg twice a day Continued home monitoring of blood pressure-returned list in 2 weeks [facility] in 6 weeks

## 2011-10-19 ENCOUNTER — Telehealth: Payer: Self-pay | Admitting: *Deleted

## 2011-10-19 ENCOUNTER — Other Ambulatory Visit: Payer: Self-pay | Admitting: *Deleted

## 2011-10-19 ENCOUNTER — Encounter: Payer: Self-pay | Admitting: *Deleted

## 2011-10-19 DIAGNOSIS — I10 Essential (primary) hypertension: Secondary | ICD-10-CM

## 2011-10-19 NOTE — Progress Notes (Signed)
Spoke with patients caregiver regarding medication changes and recommendations.  Verbalizes understanding.

## 2011-10-25 ENCOUNTER — Ambulatory Visit (INDEPENDENT_AMBULATORY_CARE_PROVIDER_SITE_OTHER): Payer: Medicare Other | Admitting: Family Medicine

## 2011-10-25 ENCOUNTER — Encounter: Payer: Self-pay | Admitting: Family Medicine

## 2011-10-25 VITALS — BP 120/70 | HR 57 | Resp 16 | Ht 64.0 in | Wt 163.0 lb

## 2011-10-25 DIAGNOSIS — N189 Chronic kidney disease, unspecified: Secondary | ICD-10-CM

## 2011-10-25 DIAGNOSIS — I10 Essential (primary) hypertension: Secondary | ICD-10-CM

## 2011-10-25 DIAGNOSIS — E785 Hyperlipidemia, unspecified: Secondary | ICD-10-CM | POA: Diagnosis not present

## 2011-10-25 DIAGNOSIS — I4891 Unspecified atrial fibrillation: Secondary | ICD-10-CM | POA: Diagnosis not present

## 2011-10-25 DIAGNOSIS — I739 Peripheral vascular disease, unspecified: Secondary | ICD-10-CM | POA: Diagnosis not present

## 2011-10-25 DIAGNOSIS — L299 Pruritus, unspecified: Secondary | ICD-10-CM

## 2011-10-25 MED ORDER — TRIAMCINOLONE ACETONIDE 0.1 % EX OINT: TOPICAL_OINTMENT | Freq: Two times a day (BID) | CUTANEOUS | Status: DC

## 2011-10-25 MED ORDER — ALPRAZOLAM 1 MG PO TABS: 1.0000 mg | ORAL_TABLET | Freq: Every evening | ORAL | Status: DC | PRN

## 2011-10-25 NOTE — Assessment & Plan Note (Signed)
Dry skin noted, trial of TAC with vaseline

## 2011-10-25 NOTE — Patient Instructions (Signed)
I will get the records and labs from Dr.Hill Blood pressure looks good  Use the cream for the skin twice a day with Vaseline as needed F/U 3 months

## 2011-10-25 NOTE — Assessment & Plan Note (Signed)
No current meds obtain records

## 2011-10-25 NOTE — Progress Notes (Addendum)
  Subjective:    Patient ID: [name], female    DOB: 05-May-1928, 78 y.o.   MRN: [id number]  HPI Patient here to establish care. Previous primary care provider Dr. Mirna Mires. She is also followed by [facility]  cardiology Dr. Dietrich Pates.  She has no concerns today. She currently lives with her son. Medications and History reviewed  She walks with a cane when told to do so. She also has a walker but does not use this. She recently had a thrombophlebitis requiring surgical intervention. She is on Coumadin for atrial fibrillation. She takes Xanax at bedtime to keep her calm and also to help her rest and she does not wander at night.   Review of Systems   GEN- denies fatigue, fever, weight loss,weakness, recent illness HEENT- denies eye drainage, change in vision, nasal discharge, CVS- denies chest pain, palpitations RESP- denies SOB, cough, wheeze ABD- denies N/V, change in stools, abd pain GU- denies dysuria, hematuria, dribbling, +incontinence MSK- + joint pain, muscle aches, injury Neuro- denies headache, dizziness, syncope, seizure activity Skin- +itching on back and abdomen, no rash x 1 week     Objective:   Physical Exam GEN- NAD, alert and oriented x3 HEENT- PERRL, EOMI, non injected sclera, pink conjunctiva, MMM, oropharynx clear Neck- Supple,  CVS- irregular rhythm, normal rate, 2/6 SEM RESP-CTAB ABD-NABS,soft,NT,ND EXT-+venous stasis, well healed surgical scar RLE Pulses- Radial, DP- 2+ Neuro- walks gingerly with cane, requires assitance,  Psych- normal mood and affect Skin- exoriations on abdomen and upper back, no lesions noted, few scabs      Assessment & Plan:

## 2011-10-25 NOTE — Assessment & Plan Note (Signed)
Reviewed last cardiology note she is in Coumadin clinic

## 2011-10-25 NOTE — Assessment & Plan Note (Signed)
Blood pressure looks very well controlled today. She's continued on beta blocker and calcium channel blocker.

## 2011-10-25 NOTE — Assessment & Plan Note (Signed)
Discussed her kidney disease. They have not seen nephrology. She is open to dialysis if needed, for now we'll monitor her renal function appear

## 2011-10-25 NOTE — Assessment & Plan Note (Signed)
No intervention. She states she does not have a lot of leg pain when she elevates her legs some. She does not have severe swelling.

## 2011-11-11 ENCOUNTER — Ambulatory Visit (INDEPENDENT_AMBULATORY_CARE_PROVIDER_SITE_OTHER): Payer: Medicare Other | Admitting: Cardiology

## 2011-11-11 ENCOUNTER — Ambulatory Visit (INDEPENDENT_AMBULATORY_CARE_PROVIDER_SITE_OTHER): Payer: Medicare Other | Admitting: *Deleted

## 2011-11-11 ENCOUNTER — Encounter: Payer: Self-pay | Admitting: Cardiology

## 2011-11-11 VITALS — BP 122/60 | HR 49 | Ht 64.0 in | Wt 162.8 lb

## 2011-11-11 DIAGNOSIS — I4891 Unspecified atrial fibrillation: Secondary | ICD-10-CM | POA: Diagnosis not present

## 2011-11-11 DIAGNOSIS — N189 Chronic kidney disease, unspecified: Secondary | ICD-10-CM

## 2011-11-11 DIAGNOSIS — E785 Hyperlipidemia, unspecified: Secondary | ICD-10-CM | POA: Diagnosis not present

## 2011-11-11 DIAGNOSIS — I743 Embolism and thrombosis of arteries of the lower extremities: Secondary | ICD-10-CM

## 2011-11-11 DIAGNOSIS — Z7901 Long term (current) use of anticoagulants: Secondary | ICD-10-CM

## 2011-11-11 DIAGNOSIS — I749 Embolism and thrombosis of unspecified artery: Secondary | ICD-10-CM

## 2011-11-11 DIAGNOSIS — I10 Essential (primary) hypertension: Secondary | ICD-10-CM

## 2011-11-11 DIAGNOSIS — D649 Anemia, unspecified: Secondary | ICD-10-CM

## 2011-11-11 MED ORDER — AMLODIPINE BESYLATE 2.5 MG PO TABS: 2.5000 mg | ORAL_TABLET | Freq: Every day | ORAL | Status: DC

## 2011-11-11 MED ORDER — ALPRAZOLAM 1 MG PO TABS: 1.0000 mg | ORAL_TABLET | Freq: Every evening | ORAL | Status: DC | PRN

## 2011-11-11 NOTE — Assessment & Plan Note (Addendum)
Creatinine has actually improved from 1.7 in 2011 to 1.4 in 06/2011 when last assessed.  A repeat chemistry profile will be obtained.

## 2011-11-11 NOTE — Assessment & Plan Note (Addendum)
Anemia had been chronic, but CBC in 03/2011 was reportedly normal.  A repeat test will be obtained.

## 2011-11-11 NOTE — Assessment & Plan Note (Addendum)
Blood pressure control has improved dramatically, but patient is complaining of pruritus and migratory skin eruptions.  She has a secondarily infected papule over the lower back.  Treatment with warm compresses and topical antibiotics will be continued.  If lesion does not resolve, oral antibiotics will be required.  The patient's daughter is to call if there is no improvement within the next few days.  Amlodipine will be restarted at a lower dose of 2.5 mg q.d. And verapamil discontinued.  Labetalol is unlikely to be the cause of a drug reaction.

## 2011-11-11 NOTE — Progress Notes (Signed)
Patient ID: [name], female   DOB: [birth year], 78 y.o.   MRN: [id number]  HPI: Scheduled return visit for this delightful older woman followed for atrial fibrillation requiring chronic anticoagulation, hypertension and peripheral vascular disease.  Since her last visit, she has done generally well.  She developed pruritus soon after treatment with verapamil and labetalol was initiated.  A papular rash as described, but is not currently present.  She has a draining papule over the left sacrum.  Blood pressure control has been substantially improved.  Patient denies chest discomfort, dyspnea or syncope.  Pedal edema has improved.  Prior to Admission medications   Medication Sig Start Date End Date Taking? Authorizing Provider  ALPRAZolam Prudy Feeler) 1 MG tablet Take 1 tablet (1 mg total) by mouth at bedtime as needed. 10/25/11  Yes [name], MD  aspirin 81 MG tablet Take 81 mg by mouth daily.   Yes Historical Provider, MD  chlorthalidone (HYGROTON) 25 MG tablet Take 0.5 tablets (12.5 mg total) by mouth daily. 09/29/11 09/28/12 Yes Kathlen Brunswick, MD  labetalol (NORMODYNE) 200 MG tablet Take 1 tablet (200 mg total) by mouth 2 (two) times daily. 09/29/11 09/28/12 Yes Kathlen Brunswick, MD  ranitidine (ZANTAC) 150 MG tablet Take 150 mg by mouth 2 (two) times daily.   Yes Historical Provider, MD  verapamil (CALAN-SR) 120 MG CR tablet Take 1 tablet (120 mg total) by mouth daily. 09/29/11 09/28/12 Yes Kathlen Brunswick, MD  warfarin (COUMADIN) 2.5 MG tablet Take 1 tablet (2.5 mg total) by mouth as directed. 08/02/11  Yes Kathlen Brunswick, MD  warfarin (COUMADIN) 4 MG tablet Take 1 tablet (4 mg total) by mouth as directed. 07/28/11  Yes Kathlen Brunswick, MD   No Known Allergies    Past medical history, social history, and family history reviewed and updated.  ROS: Denies headache, cough, sputum production, melena or hematemesis.  All other systems reviewed and are negative.  PHYSICAL EXAM: BP 122/60   Pulse 49  Ht 5\' 4"  (1.626 m)  Wt 73.846 kg (162 lb 12.8 oz)  BMI 27.94 kg/m2  General-Well developed; no acute distress Body habitus-proportionate weight and height Neck-No JVD; Modest early systolic bilateral carotid bruits Lungs-clear lung fields; resonant to percussion Cardiovascular-normal PMI; normal S1 and S2; modest basilar systolic ejection murmur Abdomen-normal bowel sounds; soft and non-tender without masses or organomegaly Musculoskeletal-No deformities, no cyanosis or clubbing Neurologic-Normal cranial nerves; symmetric strength and tone Skin-Warm, no significant lesions Extremities-distal pulses intact; 1/2+ edema  EKG: Atrial fibrillation with a slow ventricular response, heart rate-49 bpm, otherwise normal  ASSESSMENT AND PLAN:  [place] Bing, MD 11/11/2011 4:19 PM

## 2011-11-11 NOTE — Patient Instructions (Addendum)
Your physician has recommended you make the following change in your medication:   STOP Verapamil Start: Amlodipine 2.5mg  1 tablet daily  Your physician has requested that you regularly monitor and record your blood pressure readings at home. Please use the same machine at the same time of day to check your readings and record them to bring to your follow-up visit.  Please call the office if your systolic (top number) is greater than 160.  Your physician recommends that you have lab work at the [place] lab today: [facility]  We would also like for you to do the home stool hemocult test. Please follow directions and bring back in to our office  Your physician wants you to follow-up in: 6 months with Dr. Dietrich Pates. You will receive a reminder letter in the mail two months in advance. If you don't receive a letter, please call our office to schedule the follow-up appointment.

## 2011-11-11 NOTE — Assessment & Plan Note (Signed)
In the absence of known significant vascular disease, moderately elevated LDL need not be treated pharmacologically.

## 2011-11-11 NOTE — Assessment & Plan Note (Addendum)
Relative bradycardia is present with the addition of verapamil, but this does not appear to be causing symptoms or any adverse effects.  The patient is committed to lifelong anticoagulation as a result of cardiogenic emboli.  We will continue to monitor and adjust therapy as necessary.

## 2011-11-11 NOTE — Assessment & Plan Note (Signed)
Patient continues to have stable and therapeutic anticoagulation with warfarin.  Consideration can be given to substitution of a newer agent, both to prevent thromboembolism and cardiac embolization.

## 2011-11-22 ENCOUNTER — Other Ambulatory Visit: Payer: Self-pay | Admitting: Cardiology

## 2011-11-22 DIAGNOSIS — D649 Anemia, unspecified: Secondary | ICD-10-CM | POA: Diagnosis not present

## 2011-11-22 LAB — CBC WITH DIFFERENTIAL/PLATELET
Basophils Absolute: 0 10*3/uL (ref 0.0–0.1)
Eosinophils Absolute: 2.3 10*3/uL — ABNORMAL HIGH (ref 0.0–0.7)
Eosinophils Relative: 26 % — ABNORMAL HIGH (ref 0–5)
HCT: 34.1 % — ABNORMAL LOW (ref 36.0–46.0)
Lymphocytes Relative: 26 % (ref 12–46)
MCH: 31.8 pg (ref 26.0–34.0)
MCHC: 34.9 g/dL (ref 30.0–36.0)
MCV: 91.2 fL (ref 78.0–100.0)
Monocytes Absolute: 0.8 10*3/uL (ref 0.1–1.0)
RDW: 13.5 % (ref 11.5–15.5)
WBC: 9.1 10*3/uL (ref 4.0–10.5)

## 2011-11-23 LAB — COMPREHENSIVE METABOLIC PANEL
AST: 18 U/L (ref 0–37)
BUN: 42 mg/dL — ABNORMAL HIGH (ref 6–23)
Calcium: 10 mg/dL (ref 8.4–10.5)
Chloride: 105 mEq/L (ref 96–112)
Creat: 1.63 mg/dL — ABNORMAL HIGH (ref 0.50–1.10)

## 2011-11-27 ENCOUNTER — Encounter: Payer: Self-pay | Admitting: Cardiology

## 2011-11-27 DIAGNOSIS — D721 Eosinophilia: Secondary | ICD-10-CM

## 2011-11-29 ENCOUNTER — Encounter: Payer: Self-pay | Admitting: *Deleted

## 2011-11-30 ENCOUNTER — Encounter: Payer: Self-pay | Admitting: Family Medicine

## 2011-11-30 ENCOUNTER — Ambulatory Visit (INDEPENDENT_AMBULATORY_CARE_PROVIDER_SITE_OTHER): Payer: Medicare Other | Admitting: Family Medicine

## 2011-11-30 VITALS — BP 132/80 | HR 82 | Resp 18 | Ht 64.0 in | Wt 162.0 lb

## 2011-11-30 DIAGNOSIS — R21 Rash and other nonspecific skin eruption: Secondary | ICD-10-CM

## 2011-11-30 DIAGNOSIS — I10 Essential (primary) hypertension: Secondary | ICD-10-CM

## 2011-11-30 MED ORDER — PREDNISONE 10 MG PO TABS: ORAL_TABLET | ORAL | Status: DC

## 2011-11-30 MED ORDER — CEPHALEXIN 500 MG PO CAPS: 500.0000 mg | ORAL_CAPSULE | Freq: Two times a day (BID) | ORAL | Status: DC

## 2011-11-30 NOTE — Patient Instructions (Signed)
Start the antibiotics Take the prednisone for itching twice a day  Continue all other medications Stop neosporin  F/U 1 week

## 2011-12-01 ENCOUNTER — Encounter: Payer: Self-pay | Admitting: Family Medicine

## 2011-12-01 DIAGNOSIS — R21 Rash and other nonspecific skin eruption: Secondary | ICD-10-CM | POA: Insufficient documentation

## 2011-12-01 NOTE — Assessment & Plan Note (Signed)
BP stable on new regimen of norvasc and beta blocker

## 2011-12-01 NOTE — Progress Notes (Signed)
  Subjective:    Patient ID: [name], female    DOB: 11-02-1928, 78 y.o.   MRN: [id number]  HPI Pt presents with worsening rash on back and arms. Approximately 4 weeks ago, daughter noted a small papule on back this drained a little and redness began to spread, it came up after her blood pressure medications were changed and was initially thought to be allergic reaction. Since then she has been using neosporin on the lesion and soap and water. She has also developed redness on her arms and has been itching all over. Denies fever, N/V, pain in area, recent drainage from rash.   Review of Systems  GEN- denies fatigue, fever, weight loss,weakness, recent illness HEENT- denies eye drainage, change in vision, nasal discharge, CVS- denies chest pain, palpitations RESP- denies SOB, cough, wheeze ABD- denies N/V, change in stools, abd pain GU- denies dysuria, hematuria, dribbling, incontinence MSK-+ joint pain, muscle aches, injury       Objective:   Physical Exam GEN- NAD, alert and oriented x3 HEENT- PERRL, EOMI, non injected sclera, pink conjunctiva, MMM, oropharynx clear Neck- Supple, no LAD CVS- irregular rhythm, normal rate, 2/6 SEM RESP-CTAB ABD-NABS,soft,NT,ND EXT-+venous stasis, well healed surgical scar RLE Pulses- Radial, DP- 2+ Skin- mild macular rash on bilateral arms, excoriations, right lower back small papule in center with hyperpigmenation surrounding lesion erythema and scabbed lesions, no warmth, no drainage      Assessment & Plan:

## 2011-12-01 NOTE — Assessment & Plan Note (Signed)
She appears to have a possible infected papular lesion that has now spread- unknown cause of initial infection, other differential with surrounding rash and skin changes is neosporin reaction. Will place on antibiotics to cover infection, low dose prednisone for the itching and macular rash on arms. If no improvement appt with Dr. Nicholas Lose Dermatology has already been made for end of August.  I doubt this was due to diltzem drug reaction based on appearance

## 2011-12-07 ENCOUNTER — Ambulatory Visit (INDEPENDENT_AMBULATORY_CARE_PROVIDER_SITE_OTHER): Payer: Medicare Other | Admitting: Family Medicine

## 2011-12-07 VITALS — BP 140/80 | HR 90 | Temp 97.8°F | Resp 16 | Wt 155.8 lb

## 2011-12-07 DIAGNOSIS — R21 Rash and other nonspecific skin eruption: Secondary | ICD-10-CM

## 2011-12-07 NOTE — Patient Instructions (Signed)
Hydrocortisone 1% cream- twice a day  Benadryl 1 tablet twice a day for itching  Keep appointment with dermatologist

## 2011-12-09 ENCOUNTER — Encounter: Payer: Self-pay | Admitting: Family Medicine

## 2011-12-09 NOTE — Assessment & Plan Note (Signed)
Pt with new lesions despite prednisone and antibiotics. Appt with dermatology made for next week. Will add hydrocortisone to help control the itch on lesions on arms, benadryl since prednisone did not help

## 2011-12-09 NOTE — Progress Notes (Signed)
  Subjective:    Patient ID: [name], female    DOB: 04/03/1929, 78 y.o.   MRN: [id number]  HPI  Pt here to f/u rash, seen 1 week ago, for infected papule and rash with itching, given course of Keflex and oral prednisone. Minimal change in large area on back, now with new itchy lesions on bilat arms and few on chest.    Review of Systems   GEN- denies fatigue, fever, weight loss,weakness, recent illness HEENT- denies eye drainage, change in vision, nasal discharge, CVS- denies chest pain, palpitations RESP- denies SOB, cough, wheeze ABD- denies N/V, change in stools, abd pain GU- denies dysuria, hematuria, dribbling, incontinence MSK- + joint pain, muscle aches, injury Neuro- denies headache, dizziness, syncope, seizure activity      Objective:   Physical Exam GEN- NAD, alert and oriented x3 Skin-  maculopapular rash on bilateral arms, +excoriations, right lower back small papule in center with hyperpigmenation (1cm decrease in area) surrounding lesion erythema and scabbed lesions, no warmth, no drainage        Assessment & Plan:

## 2011-12-15 ENCOUNTER — Ambulatory Visit (INDEPENDENT_AMBULATORY_CARE_PROVIDER_SITE_OTHER): Payer: Medicare Other | Admitting: *Deleted

## 2011-12-15 DIAGNOSIS — I743 Embolism and thrombosis of arteries of the lower extremities: Secondary | ICD-10-CM | POA: Diagnosis not present

## 2011-12-15 DIAGNOSIS — I4891 Unspecified atrial fibrillation: Secondary | ICD-10-CM

## 2011-12-15 DIAGNOSIS — Z7901 Long term (current) use of anticoagulants: Secondary | ICD-10-CM | POA: Diagnosis not present

## 2011-12-15 DIAGNOSIS — I749 Embolism and thrombosis of unspecified artery: Secondary | ICD-10-CM

## 2011-12-16 ENCOUNTER — Telehealth: Payer: Self-pay | Admitting: Cardiology

## 2011-12-16 DIAGNOSIS — L270 Generalized skin eruption due to drugs and medicaments taken internally: Secondary | ICD-10-CM | POA: Diagnosis not present

## 2011-12-16 NOTE — Telephone Encounter (Signed)
Patient's daughter states that patient's Dermatologist [facility]) wants patient to be taken off of Labetolol and restarted on Metoprolol to see if the Labetalol is causing patient to have rash. / tg

## 2011-12-20 NOTE — Telephone Encounter (Signed)
Please advise 

## 2011-12-23 ENCOUNTER — Telehealth: Payer: Self-pay | Admitting: Cardiology

## 2011-12-23 NOTE — Telephone Encounter (Signed)
PT DAUGHTER STOPPED IN TO SEE IF WE COULD RE START HER ON METOPROLOL, SINCE WE HAVE STOPPED OTHER MEDS/TMJ

## 2011-12-23 NOTE — Telephone Encounter (Signed)
Change labetalol to metoprolol 25 mg BID.

## 2011-12-23 NOTE — Telephone Encounter (Signed)
Please advise 

## 2011-12-24 ENCOUNTER — Other Ambulatory Visit: Payer: Self-pay | Admitting: *Deleted

## 2011-12-24 MED ORDER — METOPROLOL TARTRATE 25 MG PO TABS: 25.0000 mg | ORAL_TABLET | Freq: Two times a day (BID) | ORAL | Status: DC

## 2011-12-24 NOTE — Telephone Encounter (Signed)
Daughter notified of recommendations

## 2012-01-12 ENCOUNTER — Ambulatory Visit (INDEPENDENT_AMBULATORY_CARE_PROVIDER_SITE_OTHER): Payer: Medicare Other | Admitting: *Deleted

## 2012-01-12 DIAGNOSIS — Z7901 Long term (current) use of anticoagulants: Secondary | ICD-10-CM

## 2012-01-12 DIAGNOSIS — I4891 Unspecified atrial fibrillation: Secondary | ICD-10-CM

## 2012-01-12 DIAGNOSIS — I743 Embolism and thrombosis of arteries of the lower extremities: Secondary | ICD-10-CM

## 2012-01-12 DIAGNOSIS — I749 Embolism and thrombosis of unspecified artery: Secondary | ICD-10-CM

## 2012-01-24 ENCOUNTER — Ambulatory Visit (INDEPENDENT_AMBULATORY_CARE_PROVIDER_SITE_OTHER): Payer: Medicare Other | Admitting: Family Medicine

## 2012-01-24 ENCOUNTER — Encounter: Payer: Self-pay | Admitting: Family Medicine

## 2012-01-24 VITALS — BP 140/74 | HR 96 | Resp 18 | Ht 64.0 in | Wt 164.0 lb

## 2012-01-24 DIAGNOSIS — R21 Rash and other nonspecific skin eruption: Secondary | ICD-10-CM

## 2012-01-24 DIAGNOSIS — N390 Urinary tract infection, site not specified: Secondary | ICD-10-CM

## 2012-01-24 DIAGNOSIS — I10 Essential (primary) hypertension: Secondary | ICD-10-CM | POA: Diagnosis not present

## 2012-01-24 DIAGNOSIS — Z23 Encounter for immunization: Secondary | ICD-10-CM

## 2012-01-24 MED ORDER — CIPROFLOXACIN HCL 250 MG PO TABS: 250.0000 mg | ORAL_TABLET | Freq: Two times a day (BID) | ORAL | Status: AC

## 2012-01-24 MED ORDER — ALPRAZOLAM 1 MG PO TABS: 1.0000 mg | ORAL_TABLET | Freq: Every evening | ORAL | Status: DC | PRN

## 2012-01-24 NOTE — Patient Instructions (Addendum)
Flu shot given  Continue current medication Take the Antibiotic Cipro twice a day  Continue the desitin  Continue Desitin  F/U 4 months

## 2012-01-25 ENCOUNTER — Encounter: Payer: Self-pay | Admitting: Family Medicine

## 2012-01-25 NOTE — Progress Notes (Signed)
  Subjective:    Patient ID: [name], female    DOB: 03/24/1929, 78 y.o.   MRN: [id number]  HPI  Patient here to followup rash. She was seen by dermatology rash is now clear she is has dry peeling skin. She does have a new rash beneath her abdomen hurts daughter has been using Desitin on it which has helped dramatically. She admits to dysuria for the past 3 days. Her daughter started Cipro which she had previously at home she was unable to give a urine sample during that visit as she urinated prior to. Denies fever or abdominal pain. No change in mentation.  Medications reviewed   Review of Systems  GEN- denies fatigue, fever, weight loss,weakness, recent illness HEENT- denies eye drainage, change in vision, nasal discharge, CVS- denies chest pain, palpitations RESP- denies SOB, cough, wheeze ABD- denies N/V, change in stools, abd pain GU- + dysuria, denies hematuria, dribbling, incontinence MSK- + joint pain, muscle aches, injury Neuro- denies headache, dizziness, syncope, seizure activity      Objective:   Physical Exam GEN- NAD, alert and oriented x3 HEENT- PERRL, EOMI, non injected sclera, pink conjunctiva, MMM, oropharynx clear Neck- Supple, no LAD CVS- irregular rhythm, normal rate, 2/6 SEM RESP-CTAB ABD-NABS,soft,NT,ND EXT-+venous stasis, well healed surgical scar RLE Pulses- Radial, DP- 2+ Skin- dry skin on back, rash has cleared on back, mild erythema with peeling of skin beneath pannus, no blisters or papules        Assessment & Plan:

## 2012-01-27 DIAGNOSIS — N390 Urinary tract infection, site not specified: Secondary | ICD-10-CM | POA: Insufficient documentation

## 2012-01-27 NOTE — Assessment & Plan Note (Signed)
Rash on back is now cleared. She now appears to have an intertrigo beneath her pannus which is improved very much with Desitin therefore I would not change her topical medication at this time

## 2012-01-27 NOTE — Assessment & Plan Note (Addendum)
Systolic a little high today in 140's. Otherwise BP has been good, no further changes, defer to cards

## 2012-01-27 NOTE — Assessment & Plan Note (Signed)
We'll treat based on symptoms patient unable to give urine sample and she is are restart antibiotics. Will complete course of ciprofloxacin one tablet twice a day for a total of 7 days

## 2012-02-11 ENCOUNTER — Telehealth: Payer: Self-pay | Admitting: *Deleted

## 2012-02-11 NOTE — Telephone Encounter (Signed)
I received a refill request on [name]' Alprazolam and Xanax.  If you wish to refill these medications, please indicate dose, quantity and # of refills and I will create a hard copy script for you to sign.

## 2012-02-14 ENCOUNTER — Other Ambulatory Visit: Payer: Self-pay | Admitting: *Deleted

## 2012-02-14 NOTE — Telephone Encounter (Signed)
Those medications are managed by patient's PCP-please refer to Dr. Jeanice Lim.

## 2012-02-14 NOTE — Telephone Encounter (Signed)
Medications have already been refilled.

## 2012-02-14 NOTE — Telephone Encounter (Signed)
Called [facility] to advise them to send further refill requests to Dr Jeanice Lim for these medications.

## 2012-02-21 ENCOUNTER — Ambulatory Visit (INDEPENDENT_AMBULATORY_CARE_PROVIDER_SITE_OTHER): Payer: Medicare Other | Admitting: *Deleted

## 2012-02-21 DIAGNOSIS — I743 Embolism and thrombosis of arteries of the lower extremities: Secondary | ICD-10-CM

## 2012-02-21 DIAGNOSIS — Z7901 Long term (current) use of anticoagulants: Secondary | ICD-10-CM

## 2012-02-21 DIAGNOSIS — I4891 Unspecified atrial fibrillation: Secondary | ICD-10-CM | POA: Diagnosis not present

## 2012-02-21 DIAGNOSIS — I749 Embolism and thrombosis of unspecified artery: Secondary | ICD-10-CM

## 2012-02-21 LAB — POCT INR: INR: 2.8

## 2012-02-23 ENCOUNTER — Other Ambulatory Visit: Payer: Self-pay

## 2012-02-23 MED ORDER — RANITIDINE HCL 150 MG PO TABS: 150.0000 mg | ORAL_TABLET | Freq: Two times a day (BID) | ORAL | Status: DC

## 2012-03-02 ENCOUNTER — Encounter: Payer: Self-pay | Admitting: Family Medicine

## 2012-03-02 ENCOUNTER — Ambulatory Visit (INDEPENDENT_AMBULATORY_CARE_PROVIDER_SITE_OTHER): Payer: Medicare Other | Admitting: Family Medicine

## 2012-03-02 VITALS — BP 142/76 | HR 86 | Resp 15 | Ht 64.0 in | Wt 163.1 lb

## 2012-03-02 DIAGNOSIS — N3090 Cystitis, unspecified without hematuria: Secondary | ICD-10-CM | POA: Diagnosis not present

## 2012-03-02 DIAGNOSIS — N390 Urinary tract infection, site not specified: Secondary | ICD-10-CM | POA: Diagnosis not present

## 2012-03-02 DIAGNOSIS — R51 Headache: Secondary | ICD-10-CM

## 2012-03-02 DIAGNOSIS — I10 Essential (primary) hypertension: Secondary | ICD-10-CM

## 2012-03-02 NOTE — Patient Instructions (Signed)
Continue current medications Try to get the blood pressure medications in every morning Let me know when the medicaid gets in and we can try PCS/nurse aide  Give tylenol  F/U 6 weeks for blood pressure

## 2012-03-03 ENCOUNTER — Encounter: Payer: Self-pay | Admitting: Family Medicine

## 2012-03-03 DIAGNOSIS — R51 Headache: Secondary | ICD-10-CM | POA: Insufficient documentation

## 2012-03-03 DIAGNOSIS — N3090 Cystitis, unspecified without hematuria: Secondary | ICD-10-CM | POA: Insufficient documentation

## 2012-03-03 DIAGNOSIS — R519 Headache, unspecified: Secondary | ICD-10-CM | POA: Insufficient documentation

## 2012-03-03 LAB — POCT URINALYSIS DIPSTICK
Glucose, UA: NEGATIVE
Spec Grav, UA: 1.015
Urobilinogen, UA: 0.2

## 2012-03-03 NOTE — Assessment & Plan Note (Signed)
The problem is that she's not taking her medication on a regular basis. Her medications at the be dispensed by her family members and often she will forget or get angry not want to take her medications. Her daughter is going to try to change the schedule so that she is able to give her mothers medications instead of her brother. Her last blood pressures have looked fairly well with systolic in the 140s diastolics 70s to 80s. I will have her continue to monitor blood pressure at home. If we notice that this is elevated with her taken medications on regular basis I would increase her amlodipine to 5 mg.

## 2012-03-03 NOTE — Assessment & Plan Note (Signed)
Treated for UTI back in Sept. She does not describe true UTI symptoms, UA shows some leukocytes, will await culture

## 2012-03-03 NOTE — Assessment & Plan Note (Signed)
Mild headache in setting of her blood pressure being elevated. It is now resolved. I believe these 2 were directly related. She's to use Tylenol if needed

## 2012-03-03 NOTE — Progress Notes (Signed)
  Subjective:    Patient ID: [name], female    DOB: [birth year], 78 y.o.   MRN: [id number]  HPI Over the weekend patient was complaining of headache therefore she was taken to the fire station to have a blood pressure taken. Her initial blood pressure was 200 over 90s per her daughter. She subsequently went home and noted that she had not been taking her blood pressure medication as her morning pills were still in the box. This has happened before when she complained of headache and she is not taking her blood pressure medication. After taken medication return to the fire station for recheck blood pressure was down to 170s over 80s one hour later. She states that because of her poor memory she will often forgets she's taken medication. She does sometimes get frustrated and upset with her son who is supposed to get morning meds and we'll high the medication or not take them as well. Her daughter tells me that her son is not is assertively is making sure his mother takes the medication.  Past couple of days she felt funny when she urinated, but denies dysuria, abdominal pain.  Review of Systems   GEN- denies fatigue, fever, weight loss,weakness, recent illness HEENT- denies eye drainage, change in vision, nasal discharge, CVS- denies chest pain, palpitations RESP- denies SOB, cough, wheeze ABD- denies N/V, change in stools, abd pain GU- denies dysuria, hematuria, dribbling, incontinence MSK- +s joint pain, muscle aches, injury Neuro- denies headache, dizziness, syncope, seizure activity      Objective:   Physical Exam  GEN- NAD, alert and oriented x3, walks with cane HEENT- PERRL, EOMI, non injected sclera, pink conjunctiva, MMM, oropharynx clear, fundoscopic exan benign, small pupils  Neck- Supple,  CVS- irregular rhythm, normal rate, 2/6 SEM RESP-CTAB ABS-NABS,soft,NT,ND, no suprapubic tenderness EXT-+venous stasis,  Pulses- Radial 2+ Neuro- CNII-XII grossly in tact, no new focal  deficits      Assessment & Plan:

## 2012-03-03 NOTE — Addendum Note (Signed)
Addended by: Abner Greenspan on: 03/03/2012 11:22 AM   Modules accepted: Orders

## 2012-03-04 LAB — URINE CULTURE: Colony Count: NO GROWTH

## 2012-04-03 ENCOUNTER — Other Ambulatory Visit: Payer: Self-pay | Admitting: Cardiology

## 2012-04-03 ENCOUNTER — Ambulatory Visit (INDEPENDENT_AMBULATORY_CARE_PROVIDER_SITE_OTHER): Payer: Medicare Other | Admitting: *Deleted

## 2012-04-03 DIAGNOSIS — I743 Embolism and thrombosis of arteries of the lower extremities: Secondary | ICD-10-CM

## 2012-04-03 DIAGNOSIS — I749 Embolism and thrombosis of unspecified artery: Secondary | ICD-10-CM

## 2012-04-03 DIAGNOSIS — I4891 Unspecified atrial fibrillation: Secondary | ICD-10-CM | POA: Diagnosis not present

## 2012-04-03 DIAGNOSIS — Z7901 Long term (current) use of anticoagulants: Secondary | ICD-10-CM | POA: Diagnosis not present

## 2012-04-03 LAB — POCT INR: INR: 3

## 2012-04-03 MED ORDER — WARFARIN SODIUM 2.5 MG PO TABS: 2.5000 mg | ORAL_TABLET | ORAL | Status: DC

## 2012-04-03 MED ORDER — WARFARIN SODIUM 4 MG PO TABS: 4.0000 mg | ORAL_TABLET | Freq: Every day | ORAL | Status: DC

## 2012-04-03 MED ORDER — ALPRAZOLAM 1 MG PO TABS: 1.0000 mg | ORAL_TABLET | Freq: Every evening | ORAL | Status: DC | PRN

## 2012-04-13 ENCOUNTER — Ambulatory Visit (INDEPENDENT_AMBULATORY_CARE_PROVIDER_SITE_OTHER): Payer: Medicare Other | Admitting: Family Medicine

## 2012-04-13 ENCOUNTER — Encounter: Payer: Self-pay | Admitting: Family Medicine

## 2012-04-13 VITALS — BP 140/72 | HR 86 | Resp 15 | Ht 64.0 in | Wt 161.0 lb

## 2012-04-13 DIAGNOSIS — N3090 Cystitis, unspecified without hematuria: Secondary | ICD-10-CM

## 2012-04-13 DIAGNOSIS — I10 Essential (primary) hypertension: Secondary | ICD-10-CM | POA: Diagnosis not present

## 2012-04-13 DIAGNOSIS — N390 Urinary tract infection, site not specified: Secondary | ICD-10-CM

## 2012-04-13 LAB — POCT URINALYSIS DIPSTICK
Bilirubin, UA: NEGATIVE
Nitrite, UA: NEGATIVE
Protein, UA: 100
pH, UA: 5.5

## 2012-04-13 MED ORDER — CIPROFLOXACIN HCL 500 MG PO TABS: 500.0000 mg | ORAL_TABLET | Freq: Two times a day (BID) | ORAL | Status: AC

## 2012-04-13 MED ORDER — AMLODIPINE BESYLATE 5 MG PO TABS: 5.0000 mg | ORAL_TABLET | Freq: Every day | ORAL | Status: DC

## 2012-04-13 NOTE — Progress Notes (Signed)
  Subjective:    Patient ID: [name], female    DOB: [birth year], 78 y.o.   MRN: [id number]  HPI  Patient here to follow blood pressure. Her headache has improved she occasionally has a mild when. They've been able to get her medications in and her blood pressures systolic has been staying in the 140s. She complains of dysuria for the past 3 days no blood in urine. She is incontinent of urine and wears depends she's had increased frequency over the past 3 days as well  Review of Systems   GEN- denies fatigue, fever, weight loss,weakness, recent illness HEENT- denies eye drainage, change in vision, nasal discharge, CVS- denies chest pain, palpitations RESP- denies SOB, cough, wheeze ABD- denies N/V, change in stools, abd pain GU- + dysuria, hematuria, dribbling, incontinence Neuro- denies headache, dizziness, syncope, seizure activity      Objective:   Physical Exam GEN- NAD, alert and oriented x3, walks with cane HEENT- PERRL, EOMI, non injected sclera, pink conjunctiva, MMM, oropharynx clear,  Neck- Supple,  CVS- irregular rhythm, normal rate, 2/6 SEM RESP-CTAB ABS-NABS,soft,NT,ND, no suprapubic tenderness, no CVA tenderness EXT-+venous stasis,  Pulses- Radial        Assessment & Plan:

## 2012-04-13 NOTE — Patient Instructions (Addendum)
Increase norvasc to 5mg  Continue all other medications We will call if antibiotic needs to be changed F/U 2 months for blood pressure

## 2012-04-13 NOTE — Assessment & Plan Note (Signed)
Increase Norvasc to 5mg  for better bp control may help with occasional headache family feels associated with her BP when it runs high

## 2012-04-13 NOTE — Assessment & Plan Note (Signed)
Treat for UTI course of cipro as symptomatic, check culture

## 2012-05-15 ENCOUNTER — Ambulatory Visit (INDEPENDENT_AMBULATORY_CARE_PROVIDER_SITE_OTHER): Payer: Medicare Other | Admitting: *Deleted

## 2012-05-15 ENCOUNTER — Telehealth: Payer: Self-pay | Admitting: Cardiology

## 2012-05-15 DIAGNOSIS — I749 Embolism and thrombosis of unspecified artery: Secondary | ICD-10-CM

## 2012-05-15 DIAGNOSIS — Z7901 Long term (current) use of anticoagulants: Secondary | ICD-10-CM | POA: Diagnosis not present

## 2012-05-15 DIAGNOSIS — I743 Embolism and thrombosis of arteries of the lower extremities: Secondary | ICD-10-CM

## 2012-05-15 DIAGNOSIS — I4891 Unspecified atrial fibrillation: Secondary | ICD-10-CM

## 2012-05-15 NOTE — Telephone Encounter (Signed)
Refill on Xanax called to [facility] pharmacy. / tgs

## 2012-05-15 NOTE — Telephone Encounter (Signed)
Spoke to patient's daughter and advise her that [name] was made aware in October 2013 that Dr Dietrich Pates would no longer be managing her xanax and any further prescriptions needed to come from Dr Jeanice Lim.  Verbalized understanding.

## 2012-05-16 ENCOUNTER — Other Ambulatory Visit: Payer: Self-pay

## 2012-05-16 ENCOUNTER — Telehealth: Payer: Self-pay | Admitting: Family Medicine

## 2012-05-16 MED ORDER — ALPRAZOLAM 1 MG PO TABS: 1.0000 mg | ORAL_TABLET | Freq: Every evening | ORAL | Status: DC | PRN

## 2012-05-16 NOTE — Telephone Encounter (Signed)
Med sent by Dr. Dietrich Pates.  Called and left message for daughter on voicemail.

## 2012-05-17 NOTE — Telephone Encounter (Signed)
rx refilled and sent to [facility].

## 2012-06-15 ENCOUNTER — Ambulatory Visit: Payer: Medicare Other | Admitting: Family Medicine

## 2012-06-23 ENCOUNTER — Ambulatory Visit: Payer: Medicare Other | Admitting: Family Medicine

## 2012-06-29 ENCOUNTER — Ambulatory Visit (INDEPENDENT_AMBULATORY_CARE_PROVIDER_SITE_OTHER): Payer: Medicare Other | Admitting: Family Medicine

## 2012-06-29 ENCOUNTER — Encounter: Payer: Self-pay | Admitting: Family Medicine

## 2012-06-29 VITALS — BP 140/72 | HR 100 | Resp 18 | Ht 64.0 in | Wt 162.1 lb

## 2012-06-29 DIAGNOSIS — I739 Peripheral vascular disease, unspecified: Secondary | ICD-10-CM | POA: Diagnosis not present

## 2012-06-29 DIAGNOSIS — I10 Essential (primary) hypertension: Secondary | ICD-10-CM | POA: Diagnosis not present

## 2012-06-29 DIAGNOSIS — F5104 Psychophysiologic insomnia: Secondary | ICD-10-CM

## 2012-06-29 DIAGNOSIS — G4700 Insomnia, unspecified: Secondary | ICD-10-CM

## 2012-06-29 NOTE — Progress Notes (Signed)
  Subjective:    Patient ID: [name], female    DOB: 07/05/1928, 79 y.o.   MRN: [id number]  HPI Patient here for interim visit for her blood pressure. She's taken Norvasc 5 mg metoprolol as well as chlorthalidone. A few noticed leg swelling on and off in her legs to around her knee area however this improves with her medication use as well as her compression hose. She has no new concerns today   Review of Systems  GEN- denies fatigue, fever, weight loss,weakness, recent illness HEENT- denies eye drainage, change in vision, nasal discharge, CVS- denies chest pain, palpitations RESP- denies SOB, cough, wheeze ABD- denies N/V, change in stools, abd pain GU- denies dysuria, hematuria, dribbling, incontinence MSK- denies joint pain, muscle aches, injury Neuro- denies headache, dizziness, syncope, seizure activity       Objective:   Physical Exam GEN- NAD, alert and oriented x3, walks with cane  CVS- irregular rhythm, normal rate, 2/6 SEM RESP-CTAB EXT-, trace pedal, +venous stasis,  Pulses- Radial 2+       Assessment & Plan:

## 2012-06-29 NOTE — Patient Instructions (Addendum)
Continue current medication  Blood pressure looks okay Xanax to be refilled F/U 3 months

## 2012-06-30 DIAGNOSIS — F5104 Psychophysiologic insomnia: Secondary | ICD-10-CM | POA: Insufficient documentation

## 2012-06-30 MED ORDER — AMLODIPINE BESYLATE 5 MG PO TABS: 5.0000 mg | ORAL_TABLET | Freq: Every day | ORAL | Status: DC

## 2012-06-30 MED ORDER — ALPRAZOLAM 1 MG PO TABS: 1.0000 mg | ORAL_TABLET | Freq: Every evening | ORAL | Status: DC | PRN

## 2012-06-30 MED ORDER — CHLORTHALIDONE 25 MG PO TABS: 25.0000 mg | ORAL_TABLET | Freq: Every day | ORAL | Status: DC

## 2012-06-30 MED ORDER — RANITIDINE HCL 150 MG PO TABS: 150.0000 mg | ORAL_TABLET | Freq: Two times a day (BID) | ORAL | Status: DC

## 2012-06-30 NOTE — Assessment & Plan Note (Signed)
She's been using low-dose Xanax at bedtime for many years she does well on this she's not had any recent falls or changes in mental status we will continue for now

## 2012-06-30 NOTE — Assessment & Plan Note (Signed)
She does have some leg swelling on and off her legs look good today she wears her compression hose and elevate the feet

## 2012-06-30 NOTE — Assessment & Plan Note (Signed)
Blood pressure is currently stable on current regimen. We did call the pharmacy to make sure that she has all 3 medications

## 2012-07-31 ENCOUNTER — Other Ambulatory Visit: Payer: Self-pay | Admitting: Cardiology

## 2012-08-07 ENCOUNTER — Ambulatory Visit (INDEPENDENT_AMBULATORY_CARE_PROVIDER_SITE_OTHER): Payer: Medicare Other | Admitting: *Deleted

## 2012-08-07 DIAGNOSIS — I743 Embolism and thrombosis of arteries of the lower extremities: Secondary | ICD-10-CM

## 2012-08-07 DIAGNOSIS — I4891 Unspecified atrial fibrillation: Secondary | ICD-10-CM | POA: Diagnosis not present

## 2012-08-07 DIAGNOSIS — Z7901 Long term (current) use of anticoagulants: Secondary | ICD-10-CM | POA: Diagnosis not present

## 2012-08-07 DIAGNOSIS — I749 Embolism and thrombosis of unspecified artery: Secondary | ICD-10-CM

## 2012-08-07 LAB — POCT INR: INR: 2.2

## 2012-09-18 ENCOUNTER — Ambulatory Visit (INDEPENDENT_AMBULATORY_CARE_PROVIDER_SITE_OTHER): Payer: Medicare Other | Admitting: *Deleted

## 2012-09-18 DIAGNOSIS — Z7901 Long term (current) use of anticoagulants: Secondary | ICD-10-CM

## 2012-09-18 DIAGNOSIS — I749 Embolism and thrombosis of unspecified artery: Secondary | ICD-10-CM

## 2012-09-18 DIAGNOSIS — I4891 Unspecified atrial fibrillation: Secondary | ICD-10-CM

## 2012-09-18 DIAGNOSIS — I743 Embolism and thrombosis of arteries of the lower extremities: Secondary | ICD-10-CM | POA: Diagnosis not present

## 2012-09-18 LAB — POCT INR: INR: 2.6

## 2012-09-30 ENCOUNTER — Other Ambulatory Visit: Payer: Self-pay | Admitting: Cardiology

## 2012-10-04 ENCOUNTER — Ambulatory Visit (INDEPENDENT_AMBULATORY_CARE_PROVIDER_SITE_OTHER): Payer: Medicare Other | Admitting: Family Medicine

## 2012-10-04 VITALS — BP 126/70 | HR 68 | Temp 97.9°F | Resp 16 | Ht 60.0 in | Wt 145.0 lb

## 2012-10-04 DIAGNOSIS — E785 Hyperlipidemia, unspecified: Secondary | ICD-10-CM

## 2012-10-04 DIAGNOSIS — I10 Essential (primary) hypertension: Secondary | ICD-10-CM | POA: Diagnosis not present

## 2012-10-04 DIAGNOSIS — N390 Urinary tract infection, site not specified: Secondary | ICD-10-CM

## 2012-10-04 LAB — URINALYSIS, ROUTINE W REFLEX MICROSCOPIC
Ketones, ur: NEGATIVE mg/dL
Nitrite: NEGATIVE
Specific Gravity, Urine: 1.01 (ref 1.005–1.030)
Urobilinogen, UA: 0.2 mg/dL (ref 0.0–1.0)
pH: 7 (ref 5.0–8.0)

## 2012-10-04 LAB — URINALYSIS, MICROSCOPIC ONLY: Casts: NONE SEEN

## 2012-10-04 MED ORDER — CIPROFLOXACIN HCL 500 MG PO TABS: 500.0000 mg | ORAL_TABLET | Freq: Two times a day (BID) | ORAL | Status: DC

## 2012-10-04 MED ORDER — ALPRAZOLAM 1 MG PO TABS: 1.0000 mg | ORAL_TABLET | Freq: Every evening | ORAL | Status: DC | PRN

## 2012-10-04 MED ORDER — RANITIDINE HCL 150 MG PO TABS: 150.0000 mg | ORAL_TABLET | Freq: Two times a day (BID) | ORAL | Status: DC

## 2012-10-04 MED ORDER — AMLODIPINE BESYLATE 5 MG PO TABS: 5.0000 mg | ORAL_TABLET | Freq: Every day | ORAL | Status: DC

## 2012-10-04 NOTE — Patient Instructions (Addendum)
Increase water intake, or add cranberry juice Limit the soda Continue current medications Get the labs done in [place] Antibiotics for urine infection F/U 4 months

## 2012-10-04 NOTE — Progress Notes (Signed)
  Subjective:    Patient ID: [name], female    DOB: 07-23-1928, 79 y.o.   MRN: [id number]  HPI  Patient presents to follow chronic medical problems. She's been having very foul odor to her urine for the past couple weeks as well as urinary frequency she has urinary incontinence at baseline but has worsened. She denies any abdominal pain or fever her daughter has not noted any changes in her mental status.  She is taking her medications on a fairly regular basis  Review of Systems   GEN- denies fatigue, fever, weight loss,weakness, recent illness HEENT- denies eye drainage, change in vision, nasal discharge, CVS- denies chest pain, palpitations RESP- denies SOB, cough, wheeze ABD- denies N/V, change in stools, abd pain GU- denies dysuria, hematuria, dribbling, incontinence MSK- denies joint pain, muscle aches, injury Neuro- denies headache, dizziness, syncope, seizure activity      Objective:   Physical Exam GEN- NAD, alert and oriented x3,  HEENT- PERRL, EOMI, non injected sclera, pink conjunctiva, MMM, oropharynx clear,  CVS- irregular rhythm, normal rate, 2/6 SEM RESP-CTAB ABS-NABS,soft,NT,ND, no suprapubic tenderness, no CVA tenderness EXT-+venous stasis changes, no edema Pulses- Radial   Cath specimen obtained      Assessment & Plan:

## 2012-10-05 DIAGNOSIS — N390 Urinary tract infection, site not specified: Secondary | ICD-10-CM | POA: Insufficient documentation

## 2012-10-05 NOTE — Assessment & Plan Note (Signed)
No CAD, no current therapy will recheck last in 2011

## 2012-10-05 NOTE — Assessment & Plan Note (Signed)
Tolerates Cipro, last treated Dec 2013, E coli , sent for culture, cath specimen was performed

## 2012-10-05 NOTE — Assessment & Plan Note (Signed)
Cr has been stable, recheck with labs

## 2012-10-05 NOTE — Assessment & Plan Note (Signed)
BP looks good today, no change in meds Fasting labs

## 2012-10-07 LAB — URINE CULTURE: Colony Count: >=100000

## 2012-10-30 ENCOUNTER — Ambulatory Visit (INDEPENDENT_AMBULATORY_CARE_PROVIDER_SITE_OTHER): Payer: Medicare Other | Admitting: *Deleted

## 2012-10-30 DIAGNOSIS — I743 Embolism and thrombosis of arteries of the lower extremities: Secondary | ICD-10-CM

## 2012-10-30 DIAGNOSIS — Z7901 Long term (current) use of anticoagulants: Secondary | ICD-10-CM | POA: Diagnosis not present

## 2012-10-30 DIAGNOSIS — I749 Embolism and thrombosis of unspecified artery: Secondary | ICD-10-CM

## 2012-10-30 DIAGNOSIS — E785 Hyperlipidemia, unspecified: Secondary | ICD-10-CM | POA: Diagnosis not present

## 2012-10-30 DIAGNOSIS — I4891 Unspecified atrial fibrillation: Secondary | ICD-10-CM | POA: Diagnosis not present

## 2012-10-30 DIAGNOSIS — I10 Essential (primary) hypertension: Secondary | ICD-10-CM | POA: Diagnosis not present

## 2012-10-30 LAB — POCT INR: INR: 1.8

## 2012-10-30 MED ORDER — WARFARIN SODIUM 4 MG PO TABS: 4.0000 mg | ORAL_TABLET | Freq: Every day | ORAL | Status: DC

## 2012-10-31 LAB — COMPREHENSIVE METABOLIC PANEL
ALT: <8 U/L (ref 0–35)
CO2: 24 mEq/L (ref 19–32)
Calcium: 9.8 mg/dL (ref 8.4–10.5)
Chloride: 109 mEq/L (ref 96–112)
Creat: 1.49 mg/dL — ABNORMAL HIGH (ref 0.50–1.10)
Glucose, Bld: 91 mg/dL (ref 70–99)
Total Bilirubin: 0.5 mg/dL (ref 0.3–1.2)
Total Protein: 7.6 g/dL (ref 6.0–8.3)

## 2012-10-31 LAB — LIPID PANEL
Cholesterol: 255 mg/dL — ABNORMAL HIGH (ref 0–200)
HDL: 45 mg/dL (ref >39)
LDL Cholesterol: 185 mg/dL — ABNORMAL HIGH (ref 0–99)
Triglycerides: 123 mg/dL (ref <150)

## 2012-10-31 LAB — CBC
HCT: 35.5 % — ABNORMAL LOW (ref 36.0–46.0)
Platelets: 282 10*3/uL (ref 150–400)
RBC: 3.87 MIL/uL (ref 3.87–5.11)
RDW: 13.8 % (ref 11.5–15.5)
WBC: 11.3 10*3/uL — ABNORMAL HIGH (ref 4.0–10.5)

## 2012-10-31 MED ORDER — PRAVASTATIN SODIUM 10 MG PO TABS: 10.0000 mg | ORAL_TABLET | Freq: Every day | ORAL | Status: DC

## 2012-10-31 NOTE — Addendum Note (Signed)
Addended by: Milinda Antis F on: 10/31/2012 01:37 PM   Modules accepted: Orders

## 2012-11-07 ENCOUNTER — Encounter: Payer: Self-pay | Admitting: Family Medicine

## 2012-11-20 ENCOUNTER — Ambulatory Visit (INDEPENDENT_AMBULATORY_CARE_PROVIDER_SITE_OTHER): Payer: Medicare Other | Admitting: *Deleted

## 2012-11-20 DIAGNOSIS — I4891 Unspecified atrial fibrillation: Secondary | ICD-10-CM

## 2012-11-20 DIAGNOSIS — I743 Embolism and thrombosis of arteries of the lower extremities: Secondary | ICD-10-CM | POA: Diagnosis not present

## 2012-11-20 DIAGNOSIS — Z7901 Long term (current) use of anticoagulants: Secondary | ICD-10-CM | POA: Diagnosis not present

## 2012-11-20 DIAGNOSIS — I749 Embolism and thrombosis of unspecified artery: Secondary | ICD-10-CM

## 2012-11-20 LAB — POCT INR: INR: 3.2

## 2012-12-11 ENCOUNTER — Ambulatory Visit (INDEPENDENT_AMBULATORY_CARE_PROVIDER_SITE_OTHER): Payer: Medicare Other | Admitting: *Deleted

## 2012-12-11 DIAGNOSIS — I4891 Unspecified atrial fibrillation: Secondary | ICD-10-CM | POA: Diagnosis not present

## 2012-12-11 DIAGNOSIS — I743 Embolism and thrombosis of arteries of the lower extremities: Secondary | ICD-10-CM | POA: Diagnosis not present

## 2012-12-11 DIAGNOSIS — Z7901 Long term (current) use of anticoagulants: Secondary | ICD-10-CM

## 2012-12-11 DIAGNOSIS — I749 Embolism and thrombosis of unspecified artery: Secondary | ICD-10-CM

## 2012-12-11 MED ORDER — WARFARIN SODIUM 4 MG PO TABS: 4.0000 mg | ORAL_TABLET | Freq: Every day | ORAL | Status: DC

## 2012-12-25 ENCOUNTER — Other Ambulatory Visit: Payer: Self-pay | Admitting: *Deleted

## 2012-12-25 MED ORDER — METOPROLOL TARTRATE 25 MG PO TABS: 25.0000 mg | ORAL_TABLET | Freq: Two times a day (BID) | ORAL | Status: DC

## 2013-01-03 ENCOUNTER — Ambulatory Visit (INDEPENDENT_AMBULATORY_CARE_PROVIDER_SITE_OTHER): Payer: Medicare Other | Admitting: *Deleted

## 2013-01-03 DIAGNOSIS — I743 Embolism and thrombosis of arteries of the lower extremities: Secondary | ICD-10-CM | POA: Diagnosis not present

## 2013-01-03 DIAGNOSIS — I4891 Unspecified atrial fibrillation: Secondary | ICD-10-CM

## 2013-01-03 DIAGNOSIS — Z7901 Long term (current) use of anticoagulants: Secondary | ICD-10-CM | POA: Diagnosis not present

## 2013-01-03 DIAGNOSIS — I749 Embolism and thrombosis of unspecified artery: Secondary | ICD-10-CM

## 2013-01-03 LAB — POCT INR: INR: 2.4

## 2013-01-26 ENCOUNTER — Telehealth: Payer: Self-pay | Admitting: Family Medicine

## 2013-01-26 NOTE — Telephone Encounter (Signed)
Xanax 1 mg tab 1 QHS prn last rf 12/19/12

## 2013-01-29 NOTE — Telephone Encounter (Signed)
Ok to refill 

## 2013-01-29 NOTE — Telephone Encounter (Signed)
Okay to refill, send in 3 extra refills

## 2013-01-30 ENCOUNTER — Telehealth: Payer: Self-pay | Admitting: Family Medicine

## 2013-01-30 ENCOUNTER — Telehealth: Payer: Self-pay | Admitting: *Deleted

## 2013-01-30 MED ORDER — PRAVASTATIN SODIUM 10 MG PO TABS: 10.0000 mg | ORAL_TABLET | Freq: Every day | ORAL | Status: DC

## 2013-01-30 MED ORDER — ALPRAZOLAM 1 MG PO TABS: 1.0000 mg | ORAL_TABLET | Freq: Every evening | ORAL | Status: DC | PRN

## 2013-01-30 MED ORDER — WARFARIN SODIUM 4 MG PO TABS: 4.0000 mg | ORAL_TABLET | Freq: Every day | ORAL | Status: DC

## 2013-01-30 NOTE — Telephone Encounter (Signed)
Prescription printed signed and given to pt

## 2013-01-30 NOTE — Telephone Encounter (Signed)
Pt daughter states that [name] is closing and needs a written prescription, this pharmacy will not accept any call in

## 2013-01-30 NOTE — Telephone Encounter (Signed)
noted 

## 2013-01-30 NOTE — Telephone Encounter (Signed)
Xanax 1 mg 1 QHS #30 Pravastatin 10 mg tab 1 QD #30  Daughter is waiting in lobby. [facility] is closing today and she needs a written script to take to them today. They will not let us call it in or fax it in. They told her that she has to have it written.  Can we do this for her while she is waiting?

## 2013-01-30 NOTE — Telephone Encounter (Signed)
PT NEEDS TO SWITCH PHARMACY'S TO [facility] SINCE [facility] IS GOING OUT OF BUSINESS. SHE NEEDS WARFRIN CALLED IN [facility] .

## 2013-01-30 NOTE — Telephone Encounter (Signed)
Rx given to daughter 

## 2013-02-07 ENCOUNTER — Encounter: Payer: Self-pay | Admitting: Physician Assistant

## 2013-02-07 ENCOUNTER — Ambulatory Visit (INDEPENDENT_AMBULATORY_CARE_PROVIDER_SITE_OTHER): Payer: Medicare Other | Admitting: Physician Assistant

## 2013-02-07 ENCOUNTER — Ambulatory Visit (INDEPENDENT_AMBULATORY_CARE_PROVIDER_SITE_OTHER): Payer: Medicare Other | Admitting: *Deleted

## 2013-02-07 VITALS — BP 175/89 | HR 65 | Ht 62.0 in | Wt 165.0 lb

## 2013-02-07 DIAGNOSIS — I743 Embolism and thrombosis of arteries of the lower extremities: Secondary | ICD-10-CM | POA: Diagnosis not present

## 2013-02-07 DIAGNOSIS — Z7901 Long term (current) use of anticoagulants: Secondary | ICD-10-CM | POA: Diagnosis not present

## 2013-02-07 DIAGNOSIS — I10 Essential (primary) hypertension: Secondary | ICD-10-CM

## 2013-02-07 DIAGNOSIS — I739 Peripheral vascular disease, unspecified: Secondary | ICD-10-CM | POA: Diagnosis not present

## 2013-02-07 DIAGNOSIS — I4891 Unspecified atrial fibrillation: Secondary | ICD-10-CM | POA: Diagnosis not present

## 2013-02-07 DIAGNOSIS — I749 Embolism and thrombosis of unspecified artery: Secondary | ICD-10-CM

## 2013-02-07 DIAGNOSIS — E785 Hyperlipidemia, unspecified: Secondary | ICD-10-CM

## 2013-02-07 MED ORDER — AMLODIPINE BESYLATE 5 MG PO TABS: 7.5000 mg | ORAL_TABLET | Freq: Every day | ORAL | Status: DC

## 2013-02-07 NOTE — Assessment & Plan Note (Signed)
Lipids checked earlier this year. Will not repeat

## 2013-02-07 NOTE — Assessment & Plan Note (Signed)
Blood pressure is elevated today but did come down a little when I retook it. I will increase her Norvasc to 7.5 mg daily. Hopefully this does not cause additional swelling. I've asked him to call if it does. He will followup with Dr. Jeanice Lim for her hypertension and she can see [place] back in one year.

## 2013-02-07 NOTE — Patient Instructions (Addendum)
Your physician recommends that you schedule a follow-up appointment in: 6-12 months You will receive a reminder letter two months in advance reminding you to call and schedule your appointment. If you don't receive this letter, please contact our office.  Your physician has recommended you make the following change in your medication:  1. Increase Norvasc to 1 1/2 tablets daily to equal 7.5 mg  Your Physician recommends you follow up with your primary care physician to check your blood pressure.

## 2013-02-07 NOTE — Progress Notes (Signed)
HPI:   This is a very pleasant 79 year old African American female patient of Dr. Dietrich Pates who has history of chronic atrial fibrillation on Coumadin, hypertension, and peripheral vascular disease with prior DVT. She comes in today for her yearly followup. She has no complaints. She denies chest pain, palpitations, dyspnea, dyspnea on exertion, dizziness, or presyncope. Her blood pressure is elevated today and her daughter says it usually runs between 160s and 170s systolic. It runs higher when she gets anxious or upset about something at home. Her cooks for her and falls a low sodium diet. She does have chronic lower extremity edema that seems to be stable.  No Known Allergies  Current Outpatient Prescriptions on File Prior to Visit: ALPRAZolam (XANAX) 1 MG tablet, Take 1 tablet (1 mg total) by mouth at bedtime as needed., Disp: 30 tablet, Rfl: 3 aspirin 81 MG tablet, Take 81 mg by mouth daily., Disp: , Rfl:  metoprolol tartrate (LOPRESSOR) 25 MG tablet, Take 1 tablet (25 mg total) by mouth 2 (two) times daily., Disp: 60 tablet, Rfl: 2 pravastatin (PRAVACHOL) 10 MG tablet, Take 1 tablet (10 mg total) by mouth daily., Disp: 90 tablet, Rfl: 3 ranitidine (ZANTAC) 150 MG tablet, Take 1 tablet (150 mg total) by mouth 2 (two) times daily., Disp: 60 tablet, Rfl: 3 warfarin (COUMADIN) 2.5 MG tablet, Take 1 tablet (2.5 mg total) by mouth as directed., Disp: 60 tablet, Rfl: 2 warfarin (COUMADIN) 4 MG tablet, Take 1 tablet (4 mg total) by mouth daily., Disp: 60 tablet, Rfl: 3  No current facility-administered medications on file prior to visit.   Past Medical History:   Atrial fibrillation                                            Comment:chronic requiring anticoagulation   Hypertension                                                 Chronic kidney disease                                         Comment:Stage III; creatinine of 1.7 in 04/2007 and 1.5              2009; 1.7 in 04/2010   Anemia                                                          Comment:Hemoglobin of 11.6 in 01/2010; 12.1 in 08/2010   Hyperlipidemia                                                 Comment:Total cholesterol of 226, triglycerides of 90,               HDL of 49 and LDL of 159 in 01/2010   Chronic anticoagulation  Past Surgical History:   APPENDECTOMY                                                  PARTIAL HYSTERECTOMY                                          BUNIONECTOMY                                     1995        Review of patient's family history indicates:   Diabetes                       Mother                   Heart disease                  Father                   Diabetes                       Sister                   Diabetes                       Brother                  Social History   Marital Status: Single              Spouse Name:                      Years of Education:                 Number of children:             Occupational History Occupation          Associate Professor            Comment              Retired                                 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 Neck: No JVD, HJR, Bruit, or thyroid enlargement  Lungs: No tachypnea, clear without wheezing, rales, or rhonchi  Cardiovascular: Irregular irregular, PMI not displaced, 1-2/6 systolic murmur at the left sternal border, no gallops, bruit, thrill, or heave.  Abdomen: BS normal. Soft without organomegaly, masses, lesions or tenderness.  Extremities: +1 edema on  the right lower extremity trace of edema on the left, otherwise lower extremities without cyanosis, clubbing. Good distal pulses bilateral  SKin: Warm, no lesions or rashes   Musculoskeletal: No deformities  Neuro: no focal signs  BP 175/89(repeat BP 160/75)  Pulse 65  Ht 5\' 2"  (1.575 m)  Wt 165 lb (74.844 kg)  BMI 30.17 kg/m2   EKG: Atrial fibrillation at 57 beats per minute

## 2013-02-07 NOTE — Assessment & Plan Note (Signed)
Patient is rate is controlled a little on the low side but she is tolerating it. We'll not change medications. She is on Coumadin.

## 2013-03-22 ENCOUNTER — Ambulatory Visit (INDEPENDENT_AMBULATORY_CARE_PROVIDER_SITE_OTHER): Payer: Medicare Other | Admitting: *Deleted

## 2013-03-22 DIAGNOSIS — I4891 Unspecified atrial fibrillation: Secondary | ICD-10-CM

## 2013-03-22 DIAGNOSIS — I743 Embolism and thrombosis of arteries of the lower extremities: Secondary | ICD-10-CM | POA: Diagnosis not present

## 2013-03-22 DIAGNOSIS — Z7901 Long term (current) use of anticoagulants: Secondary | ICD-10-CM

## 2013-03-22 DIAGNOSIS — I749 Embolism and thrombosis of unspecified artery: Secondary | ICD-10-CM

## 2013-03-22 LAB — POCT INR: INR: 1.6

## 2013-04-11 ENCOUNTER — Telehealth: Payer: Self-pay | Admitting: Physician Assistant

## 2013-04-11 NOTE — Telephone Encounter (Signed)
New Problem:  Thayer Ohm from [employer] is calling to check on the status of a Knee Brief Form... Thayer Ohm is requesting a call back at [phone] and the Pt's ID number is [id number].Marland KitchenMarland Kitchen

## 2013-04-11 NOTE — Telephone Encounter (Signed)
I called [employer] to determine what kind of request is being made by patient.  The representative I spoke with advised that patient is requesting a knee brace.  I advised that this request should be sent to patient's PCP, Dr. Milinda Antis at [facility].  Rep verbalized understanding and agreement.

## 2013-04-12 ENCOUNTER — Ambulatory Visit (INDEPENDENT_AMBULATORY_CARE_PROVIDER_SITE_OTHER): Payer: Medicare Other | Admitting: *Deleted

## 2013-04-12 DIAGNOSIS — I743 Embolism and thrombosis of arteries of the lower extremities: Secondary | ICD-10-CM | POA: Diagnosis not present

## 2013-04-12 DIAGNOSIS — Z7901 Long term (current) use of anticoagulants: Secondary | ICD-10-CM

## 2013-04-12 DIAGNOSIS — I4891 Unspecified atrial fibrillation: Secondary | ICD-10-CM | POA: Diagnosis not present

## 2013-04-12 DIAGNOSIS — I749 Embolism and thrombosis of unspecified artery: Secondary | ICD-10-CM

## 2013-04-12 LAB — POCT INR: INR: 1.5

## 2013-05-14 ENCOUNTER — Ambulatory Visit (INDEPENDENT_AMBULATORY_CARE_PROVIDER_SITE_OTHER): Payer: Medicare Other | Admitting: *Deleted

## 2013-05-14 ENCOUNTER — Encounter (INDEPENDENT_AMBULATORY_CARE_PROVIDER_SITE_OTHER): Payer: Self-pay

## 2013-05-14 DIAGNOSIS — I743 Embolism and thrombosis of arteries of the lower extremities: Secondary | ICD-10-CM | POA: Diagnosis not present

## 2013-05-14 DIAGNOSIS — I749 Embolism and thrombosis of unspecified artery: Secondary | ICD-10-CM

## 2013-05-14 DIAGNOSIS — I4891 Unspecified atrial fibrillation: Secondary | ICD-10-CM

## 2013-05-14 DIAGNOSIS — Z7901 Long term (current) use of anticoagulants: Secondary | ICD-10-CM

## 2013-05-14 LAB — POCT INR: INR: 2.2

## 2013-05-18 ENCOUNTER — Encounter: Payer: Self-pay | Admitting: Family Medicine

## 2013-05-18 ENCOUNTER — Ambulatory Visit (INDEPENDENT_AMBULATORY_CARE_PROVIDER_SITE_OTHER): Payer: Medicare Other | Admitting: Family Medicine

## 2013-05-18 ENCOUNTER — Telehealth: Payer: Self-pay | Admitting: Adult Health

## 2013-05-18 VITALS — BP 124/72 | HR 68 | Temp 97.9°F | Resp 18 | Ht 62.0 in | Wt 164.0 lb

## 2013-05-18 DIAGNOSIS — Z23 Encounter for immunization: Secondary | ICD-10-CM

## 2013-05-18 DIAGNOSIS — F0390 Unspecified dementia without behavioral disturbance: Secondary | ICD-10-CM

## 2013-05-18 DIAGNOSIS — N189 Chronic kidney disease, unspecified: Secondary | ICD-10-CM

## 2013-05-18 DIAGNOSIS — I10 Essential (primary) hypertension: Secondary | ICD-10-CM | POA: Diagnosis not present

## 2013-05-18 DIAGNOSIS — E785 Hyperlipidemia, unspecified: Secondary | ICD-10-CM | POA: Diagnosis not present

## 2013-05-18 DIAGNOSIS — F5104 Psychophysiologic insomnia: Secondary | ICD-10-CM

## 2013-05-18 DIAGNOSIS — G4700 Insomnia, unspecified: Secondary | ICD-10-CM

## 2013-05-18 LAB — LIPID PANEL
Cholesterol: 165 mg/dL (ref 0–200)
HDL: 49 mg/dL
LDL Cholesterol: 99 mg/dL (ref 0–99)
Total CHOL/HDL Ratio: 3.4 ratio
Triglycerides: 83 mg/dL
VLDL: 17 mg/dL (ref 0–40)

## 2013-05-18 LAB — CBC WITH DIFFERENTIAL/PLATELET
Basophils Absolute: 0 K/uL (ref 0.0–0.1)
Basophils Relative: 0 % (ref 0–1)
Eosinophils Absolute: 2.4 K/uL — ABNORMAL HIGH (ref 0.0–0.7)
Eosinophils Relative: 27 % — ABNORMAL HIGH (ref 0–5)
HCT: 36.1 % (ref 36.0–46.0)
Hemoglobin: 12.1 g/dL (ref 12.0–15.0)
Lymphocytes Relative: 33 % (ref 12–46)
Lymphs Abs: 3 K/uL (ref 0.7–4.0)
MCH: 31.1 pg (ref 26.0–34.0)
MCHC: 33.5 g/dL (ref 30.0–36.0)
MCV: 92.8 fL (ref 78.0–100.0)
Monocytes Absolute: 0.7 K/uL (ref 0.1–1.0)
Monocytes Relative: 8 % (ref 3–12)
Neutro Abs: 2.9 K/uL (ref 1.7–7.7)
Neutrophils Relative %: 32 % — ABNORMAL LOW (ref 43–77)
Platelets: 269 K/uL (ref 150–400)
RBC: 3.89 MIL/uL (ref 3.87–5.11)
RDW: 13.9 % (ref 11.5–15.5)
WBC: 9 K/uL (ref 4.0–10.5)

## 2013-05-18 LAB — COMPLETE METABOLIC PANEL WITH GFR
AST: 16 U/L (ref 0–37)
Albumin: 4 g/dL (ref 3.5–5.2)
Alkaline Phosphatase: 66 U/L (ref 39–117)
BUN: 30 mg/dL — AB (ref 6–23)
CHLORIDE: 102 meq/L (ref 96–112)
CO2: 22 meq/L (ref 19–32)
Calcium: 9.7 mg/dL (ref 8.4–10.5)
Creat: 1.58 mg/dL — ABNORMAL HIGH (ref 0.50–1.10)
GFR, Est African American: 35 mL/min — ABNORMAL LOW
GFR, Est Non African American: 30 mL/min — ABNORMAL LOW
GLUCOSE: 88 mg/dL (ref 70–99)
POTASSIUM: 3.5 meq/L (ref 3.5–5.3)
Sodium: 138 mEq/L (ref 135–145)
Total Bilirubin: 0.3 mg/dL (ref 0.3–1.2)
Total Protein: 7.1 g/dL (ref 6.0–8.3)

## 2013-05-18 LAB — RPR

## 2013-05-18 MED ORDER — METOPROLOL TARTRATE 25 MG PO TABS
25.0000 mg | ORAL_TABLET | Freq: Two times a day (BID) | ORAL | Status: DC
Start: 2013-05-18 — End: 2013-09-03

## 2013-05-18 MED ORDER — DONEPEZIL HCL 5 MG PO TABS: 5.0000 mg | ORAL_TABLET | Freq: Every day | ORAL | Status: DC

## 2013-05-18 MED ORDER — LORAZEPAM 1 MG PO TABS: 1.0000 mg | ORAL_TABLET | Freq: Every evening | ORAL | Status: DC | PRN

## 2013-05-18 NOTE — Telephone Encounter (Signed)
Medication sent via escribe.  

## 2013-05-18 NOTE — Patient Instructions (Signed)
Start aricept at bedtime Ativan to be given at bedtime Continue all other rmeds Flu shot given F/U 6 weeks

## 2013-05-18 NOTE — Telephone Encounter (Signed)
Received fax refill request  Rx # [id number]  Medication:  Metoprolol Tart 25 mg tab Qty 120 Sig:  Take one tablet by mouth twice daily Physician:  Lyman BishopLawrence

## 2013-05-18 NOTE — Progress Notes (Signed)
   Subjective:    Patient ID: [name], female    DOB: 1929/01/24, 80 y.o.   MRN: [id number]  HPI  Patient here to followup chronic medical problems and memory issues. Her daughter states that her memory is worsening over past 6 months or so and she has been acting like she's been working ( > 30 years ago) and calling out patient's and family members that are not present. She is up at night and she will not sleep. Last night she was packing up boxes per report. She has not wandered from the house and she has not been physically abusive. She's currently on Xanax 1 mg at bedtime which she's been on for many years but this is no longer making her sleep. She is taking her blood pressure medications and other medications as prescribed.   Due for flu shot   Review of Systems  GEN- denies fatigue, fever, weight loss,weakness, recent illness HEENT- denies eye drainage, change in vision, nasal discharge, CVS- denies chest pain, palpitations RESP- denies SOB, cough, wheeze ABD- denies N/V, change in stools, abd pain GU- denies dysuria, hematuria, dribbling, incontinence MSK- denies joint pain, muscle aches, injury Neuro- denies headache, dizziness, syncope, seizure activity      Objective:   Physical Exam  GEN- NAD, alert and oriented x2 HEENT- PERRL, EOMI, non injected sclera, pink conjunctiva, MMM, oropharynx clear,  CVS- irregular rhythm, normal rate, 2/6 SEM RESP-CTAB ABS-NABS,soft,NT,ND, no suprapubic tenderness, no CVA tenderness EXT-+venous stasis changes trace pedal edema Pulses- Radial 2+ Psych- normal affect and mood Neuro- Unable to do 3 worD recall, Oriented to person and place, time- 1972, knew birth date and her daughters name, could not perform rest of mini mental status         Assessment & Plan:

## 2013-05-19 LAB — TSH: TSH: 2.318 u[IU]/mL (ref 0.350–4.500)

## 2013-05-20 NOTE — Assessment & Plan Note (Signed)
Worsened with the dementia Plan to try ativan and d/c xanx May need seroquel if that does not help

## 2013-05-20 NOTE — Assessment & Plan Note (Signed)
Well controlled 

## 2013-05-20 NOTE — Assessment & Plan Note (Signed)
Based on symptoms pt has dementia, unable to complete MMSE, family declines MRI etc I will check some basic labs including RPR Start aricept at bedtime- we will do this for a week or so, before changing to the ativan

## 2013-06-04 ENCOUNTER — Ambulatory Visit (INDEPENDENT_AMBULATORY_CARE_PROVIDER_SITE_OTHER): Payer: Medicare Other | Admitting: *Deleted

## 2013-06-04 DIAGNOSIS — Z7901 Long term (current) use of anticoagulants: Secondary | ICD-10-CM | POA: Diagnosis not present

## 2013-06-04 DIAGNOSIS — I743 Embolism and thrombosis of arteries of the lower extremities: Secondary | ICD-10-CM

## 2013-06-04 DIAGNOSIS — I4891 Unspecified atrial fibrillation: Secondary | ICD-10-CM

## 2013-06-04 DIAGNOSIS — Z5181 Encounter for therapeutic drug level monitoring: Secondary | ICD-10-CM

## 2013-06-04 DIAGNOSIS — I749 Embolism and thrombosis of unspecified artery: Secondary | ICD-10-CM

## 2013-06-04 LAB — POCT INR: INR: 3.4

## 2013-06-04 MED ORDER — WARFARIN SODIUM 2.5 MG PO TABS: 2.5000 mg | ORAL_TABLET | ORAL | Status: DC

## 2013-06-20 ENCOUNTER — Telehealth: Payer: Self-pay | Admitting: *Deleted

## 2013-06-20 NOTE — Telephone Encounter (Signed)
Okay to refill? 

## 2013-06-20 NOTE — Telephone Encounter (Signed)
?   Ok to refill, last refill 05/02/13, last ov 05/18/13

## 2013-06-21 MED ORDER — ALPRAZOLAM 1 MG PO TABS: 1.0000 mg | ORAL_TABLET | Freq: Two times a day (BID) | ORAL | Status: DC | PRN

## 2013-06-21 NOTE — Telephone Encounter (Signed)
Med phoned in °

## 2013-07-10 ENCOUNTER — Other Ambulatory Visit: Payer: Self-pay | Admitting: Family Medicine

## 2013-07-11 ENCOUNTER — Ambulatory Visit (INDEPENDENT_AMBULATORY_CARE_PROVIDER_SITE_OTHER): Payer: Medicare Other | Admitting: *Deleted

## 2013-07-11 DIAGNOSIS — Z7901 Long term (current) use of anticoagulants: Secondary | ICD-10-CM

## 2013-07-11 DIAGNOSIS — I4891 Unspecified atrial fibrillation: Secondary | ICD-10-CM | POA: Diagnosis not present

## 2013-07-11 DIAGNOSIS — Z5181 Encounter for therapeutic drug level monitoring: Secondary | ICD-10-CM | POA: Diagnosis not present

## 2013-07-11 DIAGNOSIS — I743 Embolism and thrombosis of arteries of the lower extremities: Secondary | ICD-10-CM

## 2013-07-11 DIAGNOSIS — I749 Embolism and thrombosis of unspecified artery: Secondary | ICD-10-CM

## 2013-07-11 LAB — POCT INR: INR: 2.6

## 2013-07-12 NOTE — Telephone Encounter (Signed)
Refill appropriate and filled per protocol. 

## 2013-08-06 ENCOUNTER — Ambulatory Visit (INDEPENDENT_AMBULATORY_CARE_PROVIDER_SITE_OTHER): Payer: Medicare Other | Admitting: *Deleted

## 2013-08-06 DIAGNOSIS — I743 Embolism and thrombosis of arteries of the lower extremities: Secondary | ICD-10-CM | POA: Diagnosis not present

## 2013-08-06 DIAGNOSIS — Z7901 Long term (current) use of anticoagulants: Secondary | ICD-10-CM | POA: Diagnosis not present

## 2013-08-06 DIAGNOSIS — I4891 Unspecified atrial fibrillation: Secondary | ICD-10-CM | POA: Diagnosis not present

## 2013-08-06 DIAGNOSIS — Z5181 Encounter for therapeutic drug level monitoring: Secondary | ICD-10-CM | POA: Diagnosis not present

## 2013-08-06 DIAGNOSIS — I749 Embolism and thrombosis of unspecified artery: Secondary | ICD-10-CM

## 2013-08-06 LAB — POCT INR: INR: 2

## 2013-08-06 MED ORDER — AMLODIPINE BESYLATE 5 MG PO TABS: ORAL_TABLET | ORAL | Status: DC

## 2013-09-03 ENCOUNTER — Ambulatory Visit (INDEPENDENT_AMBULATORY_CARE_PROVIDER_SITE_OTHER): Payer: Medicare Other | Admitting: *Deleted

## 2013-09-03 DIAGNOSIS — I749 Embolism and thrombosis of unspecified artery: Secondary | ICD-10-CM

## 2013-09-03 DIAGNOSIS — Z5181 Encounter for therapeutic drug level monitoring: Secondary | ICD-10-CM

## 2013-09-03 DIAGNOSIS — Z7901 Long term (current) use of anticoagulants: Secondary | ICD-10-CM | POA: Diagnosis not present

## 2013-09-03 DIAGNOSIS — I4891 Unspecified atrial fibrillation: Secondary | ICD-10-CM

## 2013-09-03 DIAGNOSIS — I743 Embolism and thrombosis of arteries of the lower extremities: Secondary | ICD-10-CM | POA: Diagnosis not present

## 2013-09-03 LAB — POCT INR: INR: 2.4

## 2013-09-03 MED ORDER — WARFARIN SODIUM 4 MG PO TABS: 4.0000 mg | ORAL_TABLET | Freq: Every day | ORAL | Status: DC

## 2013-09-03 MED ORDER — METOPROLOL TARTRATE 25 MG PO TABS: 25.0000 mg | ORAL_TABLET | Freq: Two times a day (BID) | ORAL | Status: DC

## 2013-09-03 MED ORDER — AMLODIPINE BESYLATE 5 MG PO TABS: ORAL_TABLET | ORAL | Status: DC

## 2013-09-18 ENCOUNTER — Other Ambulatory Visit: Payer: Self-pay | Admitting: Family Medicine

## 2013-09-19 NOTE — Telephone Encounter (Signed)
She should not be on xanax, this was discontinued and ativan was started instead  Okay to refill ativan and other meds

## 2013-09-19 NOTE — Telephone Encounter (Signed)
Noted.   Medication called to pharmacy.

## 2013-09-19 NOTE — Telephone Encounter (Signed)
Ok to refill??  Last office visit 05/18/2013  Last refill Ativan 05/18/2013/ Xanax 06/21/2013

## 2013-09-20 ENCOUNTER — Other Ambulatory Visit: Payer: Self-pay | Admitting: Family Medicine

## 2013-10-08 ENCOUNTER — Ambulatory Visit (INDEPENDENT_AMBULATORY_CARE_PROVIDER_SITE_OTHER): Payer: Medicare Other | Admitting: *Deleted

## 2013-10-08 DIAGNOSIS — Z7901 Long term (current) use of anticoagulants: Secondary | ICD-10-CM

## 2013-10-08 DIAGNOSIS — I4891 Unspecified atrial fibrillation: Secondary | ICD-10-CM

## 2013-10-08 DIAGNOSIS — I743 Embolism and thrombosis of arteries of the lower extremities: Secondary | ICD-10-CM | POA: Diagnosis not present

## 2013-10-08 DIAGNOSIS — Z5181 Encounter for therapeutic drug level monitoring: Secondary | ICD-10-CM

## 2013-10-08 DIAGNOSIS — I749 Embolism and thrombosis of unspecified artery: Secondary | ICD-10-CM

## 2013-10-08 LAB — POCT INR: INR: 3.2

## 2013-10-08 MED ORDER — WARFARIN SODIUM 2.5 MG PO TABS: 2.5000 mg | ORAL_TABLET | ORAL | Status: DC

## 2013-10-16 ENCOUNTER — Other Ambulatory Visit: Payer: Self-pay | Admitting: Family Medicine

## 2013-10-17 ENCOUNTER — Other Ambulatory Visit: Payer: Self-pay | Admitting: *Deleted

## 2013-10-17 ENCOUNTER — Telehealth: Payer: Self-pay | Admitting: *Deleted

## 2013-10-17 MED ORDER — QUETIAPINE FUMARATE 25 MG PO TABS: 25.0000 mg | ORAL_TABLET | Freq: Every day | ORAL | Status: DC

## 2013-10-17 NOTE — Telephone Encounter (Signed)
Send seroquel 25mg  QHS, do not give with xanax

## 2013-10-17 NOTE — Telephone Encounter (Signed)
Okay to refill, give 3  

## 2013-10-17 NOTE — Telephone Encounter (Signed)
Message copied by Phillips OdorSIX, CHRISTINA H on Wed Oct 17, 2013 11:51 AM ------      Message from: Malvin JohnsBULLINS, SUSAN S      Created: Wed Oct 17, 2013 11:11 AM       Patients daughter is calling regarding her moms dementia       Please call her back at [phone] brenda perkins  ------

## 2013-10-17 NOTE — Telephone Encounter (Signed)
Medication called to pharmacy. 

## 2013-10-17 NOTE — Telephone Encounter (Signed)
[facility]   Reports that patient has increased anxiety and confusion.   States that she is "off the wall". States that she is not resting at night. She is talking and crying all night.   Patient has appointment for Monday, 10/22/2013.  Patient daughter requesting something to help patient sleep until she can be seen on Monday.   MD please advise.

## 2013-10-17 NOTE — Telephone Encounter (Signed)
Ok to refill??  Last office visit 05/18/2013.  Last refill 06/21/2013

## 2013-10-17 NOTE — Telephone Encounter (Signed)
Call placed to patient and patient daughter made aware.  Prescription sent to pharmacy.  

## 2013-10-17 NOTE — Telephone Encounter (Signed)
Call returned.   LMTRC.

## 2013-10-22 ENCOUNTER — Encounter: Payer: Self-pay | Admitting: Family Medicine

## 2013-10-22 ENCOUNTER — Ambulatory Visit (INDEPENDENT_AMBULATORY_CARE_PROVIDER_SITE_OTHER): Payer: Medicare Other | Admitting: Family Medicine

## 2013-10-22 VITALS — BP 136/78 | HR 58 | Temp 98.2°F | Resp 14 | Ht 62.0 in | Wt 148.0 lb

## 2013-10-22 DIAGNOSIS — I743 Embolism and thrombosis of arteries of the lower extremities: Secondary | ICD-10-CM | POA: Diagnosis not present

## 2013-10-22 DIAGNOSIS — E785 Hyperlipidemia, unspecified: Secondary | ICD-10-CM

## 2013-10-22 DIAGNOSIS — F0391 Unspecified dementia with behavioral disturbance: Secondary | ICD-10-CM | POA: Diagnosis not present

## 2013-10-22 DIAGNOSIS — N183 Chronic kidney disease, stage 3 unspecified: Secondary | ICD-10-CM

## 2013-10-22 DIAGNOSIS — G4700 Insomnia, unspecified: Secondary | ICD-10-CM

## 2013-10-22 DIAGNOSIS — F5104 Psychophysiologic insomnia: Secondary | ICD-10-CM

## 2013-10-22 DIAGNOSIS — I4891 Unspecified atrial fibrillation: Secondary | ICD-10-CM

## 2013-10-22 DIAGNOSIS — R634 Abnormal weight loss: Secondary | ICD-10-CM

## 2013-10-22 DIAGNOSIS — I482 Chronic atrial fibrillation, unspecified: Secondary | ICD-10-CM

## 2013-10-22 DIAGNOSIS — I10 Essential (primary) hypertension: Secondary | ICD-10-CM

## 2013-10-22 DIAGNOSIS — F03918 Unspecified dementia, unspecified severity, with other behavioral disturbance: Secondary | ICD-10-CM | POA: Diagnosis not present

## 2013-10-22 MED ORDER — DONEPEZIL HCL 10 MG PO TABS: ORAL_TABLET | ORAL | Status: DC

## 2013-10-22 NOTE — Assessment & Plan Note (Signed)
On coumadin therapy, no recent falls, rate controlled, followed by cardiology

## 2013-10-22 NOTE — Assessment & Plan Note (Signed)
Continue seroquel

## 2013-10-22 NOTE — Progress Notes (Signed)
Patient ID: [name], female   DOB: April 04, 1929, 80 y.o.   MRN: [id number]   Subjective:    Patient ID: Isabel ShortsJessie M [name], female    DOB: April 04, 1929, 80 y.o.   MRN: [id number]  Patient presents for 6 month F/U  patient to follow up medications. She's history dementia which is been worsening. Her daughter who is her main caregiver states that her brother who takes care of her at nighttime often does not understand the dementia and irritates her mother especially before bedtime which makes it difficult for her to sleep and settle down. We did start Seroquel 25 mg at bedtime over the weekend and this was much better than you to use of that lorazepam at bedtime. She's not had her Aricept for the past 4 weeks states that she cannot get it from the pharmacy. She's not been physically abusive and has not been wandering. Her daughter states her appetite is good however her weight is down about 12 or 13 pounds since our last visit in Jan   She was given a little bit better when she was on the Aricept. She has history of hypertension chronic atrial for ablation hyperlipidemia and is due for repeat fasting labs today.    Review Of Systems: UNABLE TO GET GOOD ROV  GEN- denies fatigue, fever, weight loss,weakness, recent illness HEENT- denies eye drainage, change in vision, nasal discharge, CVS- denies chest pain, palpitations RESP- denies SOB, cough, wheeze ABD- denies N/V, change in stools, abd pain MSK- denies joint pain, muscle aches, injury Neuro- denies headache, dizziness, syncope, seizure activity       Objective:    BP 136/78  Pulse 58  Temp(Src) 98.2 F (36.8 C) (Oral)  Resp 14  Ht 5\' 2"  (1.575 m)  Wt 148 lb (67.132 kg)  BMI 27.06 kg/m2 GEN- NAD, alert and oriented x2 HEENT- PERRL, EOMI, non injected sclera, pink conjunctiva, MMM, oropharynx clear,  CVS- irregular rhythm, normal rate, 2/6 SEM RESP-CTAB EXT-+venous stasis changes R>l pedal edema Pulses- Radial 2+ Psych- normal  affect and mood, Pleasant        Assessment & Plan:      Problem List Items Addressed This Visit   Hypertension   Relevant Orders      CBC with Differential      Comprehensive metabolic panel   Hyperlipidemia   Relevant Orders      Lipid panel   Dementia - Primary   Relevant Medications      donepezil (ARICEPT) tablet   Chronic kidney disease   Chronic insomnia   ATRIAL FIBRILLATION, CHRONIC      Note: This dictation was prepared with Dragon dictation along with smaller phrase technology. Any transcriptional errors that result from this process are unintentional.

## 2013-10-22 NOTE — Patient Instructions (Signed)
Increase Aricept to 10mg  Continue the quietapine at bedtime  Okay to give 1/2 tablet of the ativan during the day if needed  F/U 2 months

## 2013-10-22 NOTE — Assessment & Plan Note (Signed)
I think she has mixed vascular dementia as well as Alzheimer's based on her chronic medical problems. I will increase her Aricept to 10 mg we will continue to Seroquel 25 mg at bedtime. I've advised her daughter that she can use a half a tablet of the lorazepam 0.5 mg during the day if needed to calm her. We will see how she does with the maximum dose of Aricept at 10 mg we will likely need to add on Namenda

## 2013-10-22 NOTE — Assessment & Plan Note (Signed)
Check renal function. 

## 2013-10-22 NOTE — Assessment & Plan Note (Signed)
Noted in setting of worsening dementia continue to encourage her to eat on regular basis. Her labs will be checked

## 2013-10-22 NOTE — Assessment & Plan Note (Signed)
Well controlled, no change to meds 

## 2013-10-23 LAB — CBC WITH DIFFERENTIAL/PLATELET
BASOS PCT: 1 % (ref 0–1)
Basophils Absolute: 0.1 10*3/uL (ref 0.0–0.1)
EOS ABS: 1.6 10*3/uL — AB (ref 0.0–0.7)
Eosinophils Relative: 18 % — ABNORMAL HIGH (ref 0–5)
HEMATOCRIT: 37.5 % (ref 36.0–46.0)
HEMOGLOBIN: 12.6 g/dL (ref 12.0–15.0)
Lymphocytes Relative: 34 % (ref 12–46)
Lymphs Abs: 3 10*3/uL (ref 0.7–4.0)
MCH: 30.8 pg (ref 26.0–34.0)
MCHC: 33.6 g/dL (ref 30.0–36.0)
MCV: 91.7 fL (ref 78.0–100.0)
MONO ABS: 0.7 10*3/uL (ref 0.1–1.0)
MONOS PCT: 8 % (ref 3–12)
Neutro Abs: 3.4 10*3/uL (ref 1.7–7.7)
Neutrophils Relative %: 39 % — ABNORMAL LOW (ref 43–77)
Platelets: 283 10*3/uL (ref 150–400)
RBC: 4.09 MIL/uL (ref 3.87–5.11)
RDW: 13.9 % (ref 11.5–15.5)
WBC: 8.8 10*3/uL (ref 4.0–10.5)

## 2013-10-23 LAB — COMPREHENSIVE METABOLIC PANEL
ALBUMIN: 4.1 g/dL (ref 3.5–5.2)
ALT: <8 U/L (ref 0–35)
AST: 16 U/L (ref 0–37)
Alkaline Phosphatase: 66 U/L (ref 39–117)
BILIRUBIN TOTAL: 0.5 mg/dL (ref 0.2–1.2)
BUN: 19 mg/dL (ref 6–23)
CO2: 26 meq/L (ref 19–32)
Calcium: 9.3 mg/dL (ref 8.4–10.5)
Chloride: 105 mEq/L (ref 96–112)
Creat: 1.35 mg/dL — ABNORMAL HIGH (ref 0.50–1.10)
GLUCOSE: 93 mg/dL (ref 70–99)
POTASSIUM: 4.1 meq/L (ref 3.5–5.3)
SODIUM: 140 meq/L (ref 135–145)
TOTAL PROTEIN: 7.1 g/dL (ref 6.0–8.3)

## 2013-10-23 LAB — LIPID PANEL
CHOLESTEROL: 172 mg/dL (ref 0–200)
HDL: 45 mg/dL (ref >39)
LDL Cholesterol: 109 mg/dL — ABNORMAL HIGH (ref 0–99)
Total CHOL/HDL Ratio: 3.8 Ratio
Triglycerides: 89 mg/dL (ref <150)
VLDL: 18 mg/dL (ref 0–40)

## 2013-11-05 ENCOUNTER — Ambulatory Visit (INDEPENDENT_AMBULATORY_CARE_PROVIDER_SITE_OTHER): Payer: Medicare Other | Admitting: *Deleted

## 2013-11-05 DIAGNOSIS — Z7901 Long term (current) use of anticoagulants: Secondary | ICD-10-CM | POA: Diagnosis not present

## 2013-11-05 DIAGNOSIS — I743 Embolism and thrombosis of arteries of the lower extremities: Secondary | ICD-10-CM

## 2013-11-05 DIAGNOSIS — I4891 Unspecified atrial fibrillation: Secondary | ICD-10-CM | POA: Diagnosis not present

## 2013-11-05 DIAGNOSIS — Z5181 Encounter for therapeutic drug level monitoring: Secondary | ICD-10-CM | POA: Diagnosis not present

## 2013-11-05 DIAGNOSIS — I749 Embolism and thrombosis of unspecified artery: Secondary | ICD-10-CM

## 2013-11-05 LAB — POCT INR: INR: 3.3

## 2013-11-14 ENCOUNTER — Other Ambulatory Visit: Payer: Self-pay | Admitting: Family Medicine

## 2013-11-14 NOTE — Telephone Encounter (Signed)
Refill appropriate and filled per protocol. 

## 2013-11-28 ENCOUNTER — Ambulatory Visit (INDEPENDENT_AMBULATORY_CARE_PROVIDER_SITE_OTHER): Payer: Medicare Other | Admitting: *Deleted

## 2013-11-28 DIAGNOSIS — I4891 Unspecified atrial fibrillation: Secondary | ICD-10-CM

## 2013-11-28 DIAGNOSIS — I749 Embolism and thrombosis of unspecified artery: Secondary | ICD-10-CM

## 2013-11-28 DIAGNOSIS — I743 Embolism and thrombosis of arteries of the lower extremities: Secondary | ICD-10-CM

## 2013-11-28 DIAGNOSIS — Z7901 Long term (current) use of anticoagulants: Secondary | ICD-10-CM

## 2013-11-28 DIAGNOSIS — Z5181 Encounter for therapeutic drug level monitoring: Secondary | ICD-10-CM

## 2013-11-28 LAB — POCT INR: INR: 1.5

## 2013-11-28 MED ORDER — METOPROLOL TARTRATE 25 MG PO TABS: 25.0000 mg | ORAL_TABLET | Freq: Two times a day (BID) | ORAL | Status: DC

## 2013-12-19 ENCOUNTER — Ambulatory Visit (INDEPENDENT_AMBULATORY_CARE_PROVIDER_SITE_OTHER): Payer: Medicare Other | Admitting: *Deleted

## 2013-12-19 DIAGNOSIS — I743 Embolism and thrombosis of arteries of the lower extremities: Secondary | ICD-10-CM

## 2013-12-19 DIAGNOSIS — I749 Embolism and thrombosis of unspecified artery: Secondary | ICD-10-CM

## 2013-12-19 DIAGNOSIS — Z7901 Long term (current) use of anticoagulants: Secondary | ICD-10-CM | POA: Diagnosis not present

## 2013-12-19 DIAGNOSIS — I4891 Unspecified atrial fibrillation: Secondary | ICD-10-CM

## 2013-12-19 DIAGNOSIS — Z5181 Encounter for therapeutic drug level monitoring: Secondary | ICD-10-CM | POA: Diagnosis not present

## 2013-12-19 LAB — POCT INR: INR: 2

## 2013-12-21 ENCOUNTER — Encounter: Payer: Self-pay | Admitting: Family Medicine

## 2013-12-21 ENCOUNTER — Ambulatory Visit (INDEPENDENT_AMBULATORY_CARE_PROVIDER_SITE_OTHER): Payer: Medicare Other | Admitting: Family Medicine

## 2013-12-21 VITALS — BP 130/72 | HR 64 | Temp 97.5°F | Resp 14 | Ht 60.0 in | Wt 147.0 lb

## 2013-12-21 DIAGNOSIS — I743 Embolism and thrombosis of arteries of the lower extremities: Secondary | ICD-10-CM | POA: Diagnosis not present

## 2013-12-21 DIAGNOSIS — F5104 Psychophysiologic insomnia: Secondary | ICD-10-CM

## 2013-12-21 DIAGNOSIS — G4700 Insomnia, unspecified: Secondary | ICD-10-CM | POA: Diagnosis not present

## 2013-12-21 DIAGNOSIS — F03918 Unspecified dementia, unspecified severity, with other behavioral disturbance: Secondary | ICD-10-CM | POA: Diagnosis not present

## 2013-12-21 DIAGNOSIS — R634 Abnormal weight loss: Secondary | ICD-10-CM | POA: Diagnosis not present

## 2013-12-21 DIAGNOSIS — F0391 Unspecified dementia with behavioral disturbance: Secondary | ICD-10-CM

## 2013-12-21 NOTE — Assessment & Plan Note (Signed)
Aricept to be taken once a day for 10 mg dose I wouldn't see if maybe Namenda needs to be added on. She will continue the Seroquel at bedtime as she is doing fairly well with this. Discussed importance of 24-hour supervision to prevent the wandering episodes and to redirect her.

## 2013-12-21 NOTE — Assessment & Plan Note (Signed)
Continue medications per above

## 2013-12-21 NOTE — Assessment & Plan Note (Signed)
Appetite improved, weight steady only 1lb loss

## 2013-12-21 NOTE — Patient Instructions (Signed)
Continue current medications Compression hose  Aricept for memory  F/U 4 months

## 2013-12-21 NOTE — Progress Notes (Signed)
Patient ID: [name], female   DOB: [birth year], 80 y.o.   MRN: [id number]   Subjective:    Patient ID: [name], female    DOB: [birth year], 80 y.o.   MRN: [id number]  Patient presents for 2 month F/U  Patient here to followup medications here last visit her Aricept was increased to 10 mg however her daughter was confused and has not been given for this on a regular basis she has been given Seroquel 25 mg at bedtime as well as her Ativan. Occasionally she does have to give her half Ativan during the day. Overall her behavior has been good. She did have a wandering incident but she was left at home alone her son was supposed to be watching her and he went to a nearby convenient store she actually walked a block and a half up the street she attacks including think that she was going to her job the least bit her but she told him addressed she was brought back home. There is no injury when she wandered. Her appetite has been steady she has not lost any more weight.   Review Of Systems:  GEN- denies fatigue, fever, weight loss,weakness, recent illness HEENT- denies eye drainage, change in vision, nasal discharge, CVS- denies chest pain, palpitations RESP- denies SOB, cough, wheeze ABD- denies N/V, change in stools, abd pain Neuro- denies headache, dizziness, syncope, seizure activity       Objective:    BP 130/72  Pulse 64  Temp(Src) 97.5 F (36.4 C) (Oral)  Resp 14  Ht 5' (1.524 m)  Wt 147 lb (66.679 kg)  BMI 28.71 kg/m2 GEN- NAD, alert and oriented x2 HEENT- PERRL, EOMI, non injected sclera, pink conjunctiva, MMM, oropharynx clear,  CVS- irregular rhythm, normal rate, 2/6 SEM RESP-CTAB EXT-+venous stasis changes pedal edema Pulses- Radial 2+ Psych- normal affect and mood, Pleasant, normal speech        Assessment & Plan:      Problem List Items Addressed This Visit   None      Note: This dictation was prepared with Dragon dictation along with smaller phrase  technology. Any transcriptional errors that result from this process are unintentional.

## 2014-01-16 ENCOUNTER — Ambulatory Visit (INDEPENDENT_AMBULATORY_CARE_PROVIDER_SITE_OTHER): Payer: Medicare Other | Admitting: *Deleted

## 2014-01-16 DIAGNOSIS — I749 Embolism and thrombosis of unspecified artery: Secondary | ICD-10-CM

## 2014-01-16 DIAGNOSIS — Z7901 Long term (current) use of anticoagulants: Secondary | ICD-10-CM

## 2014-01-16 DIAGNOSIS — Z5181 Encounter for therapeutic drug level monitoring: Secondary | ICD-10-CM | POA: Diagnosis not present

## 2014-01-16 DIAGNOSIS — I743 Embolism and thrombosis of arteries of the lower extremities: Secondary | ICD-10-CM

## 2014-01-16 DIAGNOSIS — I4891 Unspecified atrial fibrillation: Secondary | ICD-10-CM

## 2014-01-16 LAB — POCT INR: INR: 3

## 2014-02-05 ENCOUNTER — Other Ambulatory Visit: Payer: Self-pay | Admitting: Family Medicine

## 2014-02-05 NOTE — Telephone Encounter (Signed)
Medication refilled per protocol. 

## 2014-02-13 ENCOUNTER — Ambulatory Visit (INDEPENDENT_AMBULATORY_CARE_PROVIDER_SITE_OTHER): Payer: Medicare Other | Admitting: *Deleted

## 2014-02-13 DIAGNOSIS — I743 Embolism and thrombosis of arteries of the lower extremities: Secondary | ICD-10-CM

## 2014-02-13 DIAGNOSIS — I4891 Unspecified atrial fibrillation: Secondary | ICD-10-CM

## 2014-02-13 DIAGNOSIS — Z5181 Encounter for therapeutic drug level monitoring: Secondary | ICD-10-CM

## 2014-02-13 DIAGNOSIS — Z7901 Long term (current) use of anticoagulants: Secondary | ICD-10-CM | POA: Diagnosis not present

## 2014-02-13 DIAGNOSIS — I749 Embolism and thrombosis of unspecified artery: Secondary | ICD-10-CM

## 2014-02-13 LAB — POCT INR: INR: 1.3

## 2014-02-27 ENCOUNTER — Ambulatory Visit (INDEPENDENT_AMBULATORY_CARE_PROVIDER_SITE_OTHER): Payer: Medicare Other | Admitting: *Deleted

## 2014-02-27 DIAGNOSIS — Z7901 Long term (current) use of anticoagulants: Secondary | ICD-10-CM | POA: Diagnosis not present

## 2014-02-27 DIAGNOSIS — I749 Embolism and thrombosis of unspecified artery: Secondary | ICD-10-CM

## 2014-02-27 DIAGNOSIS — I4891 Unspecified atrial fibrillation: Secondary | ICD-10-CM | POA: Diagnosis not present

## 2014-02-27 DIAGNOSIS — I743 Embolism and thrombosis of arteries of the lower extremities: Secondary | ICD-10-CM

## 2014-02-27 DIAGNOSIS — Z5181 Encounter for therapeutic drug level monitoring: Secondary | ICD-10-CM

## 2014-02-27 LAB — POCT INR: INR: 2.4

## 2014-03-27 ENCOUNTER — Ambulatory Visit (INDEPENDENT_AMBULATORY_CARE_PROVIDER_SITE_OTHER): Payer: Medicare Other | Admitting: *Deleted

## 2014-03-27 DIAGNOSIS — I743 Embolism and thrombosis of arteries of the lower extremities: Secondary | ICD-10-CM | POA: Diagnosis not present

## 2014-03-27 DIAGNOSIS — Z7901 Long term (current) use of anticoagulants: Secondary | ICD-10-CM | POA: Diagnosis not present

## 2014-03-27 DIAGNOSIS — Z5181 Encounter for therapeutic drug level monitoring: Secondary | ICD-10-CM

## 2014-03-27 DIAGNOSIS — I749 Embolism and thrombosis of unspecified artery: Secondary | ICD-10-CM

## 2014-03-27 DIAGNOSIS — I4891 Unspecified atrial fibrillation: Secondary | ICD-10-CM

## 2014-03-27 LAB — POCT INR: INR: 1.6

## 2014-04-17 ENCOUNTER — Ambulatory Visit (INDEPENDENT_AMBULATORY_CARE_PROVIDER_SITE_OTHER): Payer: Medicare Other | Admitting: *Deleted

## 2014-04-17 DIAGNOSIS — I743 Embolism and thrombosis of arteries of the lower extremities: Secondary | ICD-10-CM | POA: Diagnosis not present

## 2014-04-17 DIAGNOSIS — Z5181 Encounter for therapeutic drug level monitoring: Secondary | ICD-10-CM | POA: Diagnosis not present

## 2014-04-17 DIAGNOSIS — I4891 Unspecified atrial fibrillation: Secondary | ICD-10-CM | POA: Diagnosis not present

## 2014-04-17 DIAGNOSIS — I749 Embolism and thrombosis of unspecified artery: Secondary | ICD-10-CM

## 2014-04-17 DIAGNOSIS — Z7901 Long term (current) use of anticoagulants: Secondary | ICD-10-CM

## 2014-04-17 LAB — POCT INR: INR: 2.3

## 2014-04-22 ENCOUNTER — Ambulatory Visit: Payer: Medicare Other | Admitting: Family Medicine

## 2014-04-24 ENCOUNTER — Other Ambulatory Visit: Payer: Self-pay | Admitting: Family Medicine

## 2014-04-28 NOTE — Telephone Encounter (Signed)
Ok to refill??  Last office visit 12/17/2013.  Last refill 10/17/2013, #3 refills.

## 2014-04-29 NOTE — Telephone Encounter (Signed)
Rx called in 

## 2014-04-29 NOTE — Telephone Encounter (Signed)
Okay to refill? 

## 2014-05-01 ENCOUNTER — Ambulatory Visit (INDEPENDENT_AMBULATORY_CARE_PROVIDER_SITE_OTHER): Payer: Medicare Other | Admitting: Family Medicine

## 2014-05-01 ENCOUNTER — Encounter: Payer: Self-pay | Admitting: Family Medicine

## 2014-05-01 VITALS — BP 142/86 | HR 68 | Temp 98.4°F | Resp 14 | Ht 60.0 in | Wt 149.0 lb

## 2014-05-01 DIAGNOSIS — I10 Essential (primary) hypertension: Secondary | ICD-10-CM

## 2014-05-01 DIAGNOSIS — G4700 Insomnia, unspecified: Secondary | ICD-10-CM | POA: Diagnosis not present

## 2014-05-01 DIAGNOSIS — E785 Hyperlipidemia, unspecified: Secondary | ICD-10-CM | POA: Diagnosis not present

## 2014-05-01 DIAGNOSIS — I743 Embolism and thrombosis of arteries of the lower extremities: Secondary | ICD-10-CM | POA: Diagnosis not present

## 2014-05-01 DIAGNOSIS — F0391 Unspecified dementia with behavioral disturbance: Secondary | ICD-10-CM | POA: Diagnosis not present

## 2014-05-01 DIAGNOSIS — N183 Chronic kidney disease, stage 3 unspecified: Secondary | ICD-10-CM

## 2014-05-01 DIAGNOSIS — Z23 Encounter for immunization: Secondary | ICD-10-CM

## 2014-05-01 DIAGNOSIS — F5104 Psychophysiologic insomnia: Secondary | ICD-10-CM

## 2014-05-01 LAB — LIPID PANEL
Cholesterol: 152 mg/dL (ref 0–200)
HDL: 45 mg/dL (ref >39)
LDL CALC: 91 mg/dL (ref 0–99)
TRIGLYCERIDES: 80 mg/dL (ref <150)
Total CHOL/HDL Ratio: 3.4 Ratio
VLDL: 16 mg/dL (ref 0–40)

## 2014-05-01 LAB — CBC WITH DIFFERENTIAL/PLATELET
Basophils Absolute: 0.1 10*3/uL (ref 0.0–0.1)
Basophils Relative: 1 % (ref 0–1)
Eosinophils Absolute: 1.7 10*3/uL — ABNORMAL HIGH (ref 0.0–0.7)
Eosinophils Relative: 20 % — ABNORMAL HIGH (ref 0–5)
HEMATOCRIT: 34 % — AB (ref 36.0–46.0)
HEMOGLOBIN: 11.3 g/dL — AB (ref 12.0–15.0)
LYMPHS ABS: 2.6 10*3/uL (ref 0.7–4.0)
LYMPHS PCT: 30 % (ref 12–46)
MCH: 31 pg (ref 26.0–34.0)
MCHC: 33.2 g/dL (ref 30.0–36.0)
MCV: 93.4 fL (ref 78.0–100.0)
MPV: 11 fL (ref 8.6–12.4)
Monocytes Absolute: 0.7 10*3/uL (ref 0.1–1.0)
Monocytes Relative: 8 % (ref 3–12)
NEUTROS PCT: 41 % — AB (ref 43–77)
Neutro Abs: 3.5 10*3/uL (ref 1.7–7.7)
Platelets: 267 10*3/uL (ref 150–400)
RBC: 3.64 MIL/uL — ABNORMAL LOW (ref 3.87–5.11)
RDW: 13.8 % (ref 11.5–15.5)
WBC: 8.5 10*3/uL (ref 4.0–10.5)

## 2014-05-01 LAB — COMPREHENSIVE METABOLIC PANEL
ALBUMIN: 3.8 g/dL (ref 3.5–5.2)
AST: 17 U/L (ref 0–37)
Alkaline Phosphatase: 69 U/L (ref 39–117)
BUN: 29 mg/dL — ABNORMAL HIGH (ref 6–23)
CALCIUM: 9.3 mg/dL (ref 8.4–10.5)
CHLORIDE: 108 meq/L (ref 96–112)
CO2: 23 meq/L (ref 19–32)
Creat: 1.48 mg/dL — ABNORMAL HIGH (ref 0.50–1.10)
GLUCOSE: 85 mg/dL (ref 70–99)
POTASSIUM: 3.4 meq/L — AB (ref 3.5–5.3)
SODIUM: 142 meq/L (ref 135–145)
TOTAL PROTEIN: 6.6 g/dL (ref 6.0–8.3)
Total Bilirubin: 0.4 mg/dL (ref 0.2–1.2)

## 2014-05-01 MED ORDER — QUETIAPINE FUMARATE 50 MG PO TABS: 50.0000 mg | ORAL_TABLET | Freq: Every day | ORAL | Status: DC

## 2014-05-01 MED ORDER — PRAVASTATIN SODIUM 10 MG PO TABS: 10.0000 mg | ORAL_TABLET | Freq: Every day | ORAL | Status: DC

## 2014-05-01 MED ORDER — AMLODIPINE BESYLATE 5 MG PO TABS: ORAL_TABLET | ORAL | Status: DC

## 2014-05-01 NOTE — Progress Notes (Signed)
Patient ID: Isabel ShortsJessie M Dobos, female   DOB: 12-03-1928, 80 y.o.   MRN: [id number]   Subjective:    Patient ID: [name], female    DOB: 12-03-1928, 80 y.o.   MRN: [id number]  Patient presents for 4 month F/U and Insomnia  patient here to follow chronic medical problems. She has history of dementia with behavioral changes. I have on Aricept 10 mg as well as Seroquel 25 mg at night for the Seroquel at the current dose is not putting her to sleep. Her daughter requested changing the medication. She does fairly well during the day and her appetite is so-so but she is not losing any weight. She is taking off her medications as prescribed. She is due for labs today as well as flu shot    Review Of Systems:  GEN- denies fatigue, fever, weight loss,weakness, recent illness HEENT- denies eye drainage, change in vision, nasal discharge, CVS- denies chest pain, palpitations RESP- denies SOB, cough, wheeze ABD- denies N/V, change in stools, abd pain GU- denies dysuria, hematuria, dribbling, incontinence MSK- denies joint pain, muscle aches, injury Neuro- denies headache, dizziness, syncope, seizure activity       Objective:    BP 142/86 mmHg  Pulse 68  Temp(Src) 98.4 F (36.9 C) (Oral)  Resp 14  Ht 5' (1.524 m)  Wt 149 lb (67.586 kg)  BMI 29.10 kg/m2 GEN- NAD, alert and oriented x2 HEENT- PERRL, EOMI, non injected sclera, pink conjunctiva, MMM, oropharynx clear,  CVS- irregular rhythm, normal rate, 2/6 SEM RESP-CTAB EXT-+venous stasis changes pedal edema Pulses- Radial 2+ Psych- normal affect and mood, Pleasant,          Assessment & Plan:      Problem List Items Addressed This Visit      Unprioritized   Hypertension   Relevant Orders      CBC with Differential (Completed)      Comprehensive metabolic panel (Completed)   Hyperlipidemia   Relevant Orders      Lipid panel (Completed)   Dementia   Relevant Medications      QUEtiapine (SEROQUEL) tablet      Memantine  HCl ER (NAMENDA XR TITRATION PACK) 7 & 14 & 21 &28 MG CP24   Chronic kidney disease - Primary   Relevant Orders      CBC with Differential (Completed)      Comprehensive metabolic panel (Completed)   Chronic insomnia    Other Visit Diagnoses    Need for prophylactic vaccination and inoculation against influenza        Relevant Orders       Flu Vaccine QUAD 36+ mos PF IM (Fluarix Quad PF) (Completed)       Note: This dictation was prepared with Dragon dictation along with smaller phrase technology. Any transcriptional errors that result from this process are unintentional.

## 2014-05-01 NOTE — Assessment & Plan Note (Signed)
Continue pravastatin 

## 2014-05-01 NOTE — Patient Instructions (Addendum)
Flu shot today We will call with lab results Cranberry juice, water for the bladder odor  Seroquel increased to 50mg  After 2 weeks start Namenda per the package We will call in 4 weeks to see how she is doing F/U 3 months

## 2014-05-01 NOTE — Assessment & Plan Note (Signed)
She's had some worsening dementia with confusion as well as difficulty with sleep and behavior mostly at nighttime. I will start with increasing her Seroquel to 50 mg and have her daughter give this around 8:00 often they cannot get her to sleep until 12 or 1:00 in the morning. If she does okay with this we will plan to add Namenda to the Aricept. He gave them a titration package from the office

## 2014-05-01 NOTE — Assessment & Plan Note (Signed)
Blood pressure looks okay today no change in medication I did do her fasting labs

## 2014-05-07 ENCOUNTER — Encounter: Payer: Self-pay | Admitting: *Deleted

## 2014-05-15 ENCOUNTER — Ambulatory Visit (INDEPENDENT_AMBULATORY_CARE_PROVIDER_SITE_OTHER): Payer: Medicare Other | Admitting: *Deleted

## 2014-05-15 DIAGNOSIS — I480 Paroxysmal atrial fibrillation: Secondary | ICD-10-CM | POA: Diagnosis not present

## 2014-05-15 DIAGNOSIS — Z7901 Long term (current) use of anticoagulants: Secondary | ICD-10-CM | POA: Diagnosis not present

## 2014-05-15 DIAGNOSIS — Z5181 Encounter for therapeutic drug level monitoring: Secondary | ICD-10-CM

## 2014-05-15 DIAGNOSIS — I749 Embolism and thrombosis of unspecified artery: Secondary | ICD-10-CM

## 2014-05-15 DIAGNOSIS — I743 Embolism and thrombosis of arteries of the lower extremities: Secondary | ICD-10-CM | POA: Diagnosis not present

## 2014-05-15 DIAGNOSIS — I4891 Unspecified atrial fibrillation: Secondary | ICD-10-CM | POA: Diagnosis not present

## 2014-05-15 LAB — POCT INR: INR: 2.3

## 2014-05-27 ENCOUNTER — Encounter: Payer: Self-pay | Admitting: Family Medicine

## 2014-05-27 ENCOUNTER — Telehealth: Payer: Self-pay | Admitting: Family Medicine

## 2014-05-27 NOTE — Telephone Encounter (Signed)
-----   Message from Durwin Norahristina H Six, LPN sent at [id number]/09811/25/2016  4:31 PM EST ----- Regarding: RE: f/u Seroquel and Namenda Call placed to patient daughter, Steward DroneBrenda.   Reports that patient seems to be doing fair with medications, but she has has had some company at the home that has been "getting on her nerve". States that she would like to continue medication and see if she improves any more now that she no longer has company.  ----- Message -----    From: Salley ScarletKawanta F [place], MD    Sent: 05/27/2014   4:12 PM      To: Durwin Norahristina H Six, LPN Subject: FW: f/u Seroquel and Namenda                   Can you call pt daughter, how is she doing with the new dementia medication Namenda.  ----- Message -----    From: Salley ScarletKawanta F [place], MD    Sent: 05/27/2014      To: Salley ScarletKawanta F [place], MD Subject: f/u Seroquel and Namenda

## 2014-05-27 NOTE — Telephone Encounter (Signed)
Noted pt doing okay with seroquel and namenda/aricept, will plan to get her on Namzeric

## 2014-06-12 ENCOUNTER — Ambulatory Visit (INDEPENDENT_AMBULATORY_CARE_PROVIDER_SITE_OTHER): Payer: Medicare Other | Admitting: *Deleted

## 2014-06-12 DIAGNOSIS — I749 Embolism and thrombosis of unspecified artery: Secondary | ICD-10-CM

## 2014-06-12 DIAGNOSIS — Z7901 Long term (current) use of anticoagulants: Secondary | ICD-10-CM

## 2014-06-12 DIAGNOSIS — I4891 Unspecified atrial fibrillation: Secondary | ICD-10-CM | POA: Diagnosis not present

## 2014-06-12 DIAGNOSIS — Z5181 Encounter for therapeutic drug level monitoring: Secondary | ICD-10-CM | POA: Diagnosis not present

## 2014-06-12 DIAGNOSIS — I743 Embolism and thrombosis of arteries of the lower extremities: Secondary | ICD-10-CM

## 2014-06-12 DIAGNOSIS — I480 Paroxysmal atrial fibrillation: Secondary | ICD-10-CM

## 2014-06-12 LAB — POCT INR: INR: 2.4

## 2014-07-10 ENCOUNTER — Telehealth: Payer: Self-pay | Admitting: Family Medicine

## 2014-07-10 NOTE — Telephone Encounter (Signed)
Call pt daughter, I want her to increase seroquel give 1.5tabs at bedtime to help with sleep, she shoould have enough meds to do this until next week appt   Make sure she is on both Aricept 10mg  and Namenda 28xr   Or Namzeric 10-28mg  XR once a day   Make OV for next week

## 2014-07-10 NOTE — Telephone Encounter (Signed)
Call placed to patient and patient made aware.  

## 2014-07-17 ENCOUNTER — Ambulatory Visit: Payer: Medicare Other | Admitting: Cardiovascular Disease

## 2014-07-22 ENCOUNTER — Other Ambulatory Visit: Payer: Self-pay | Admitting: Family Medicine

## 2014-07-24 ENCOUNTER — Ambulatory Visit (INDEPENDENT_AMBULATORY_CARE_PROVIDER_SITE_OTHER): Payer: Medicare Other | Admitting: *Deleted

## 2014-07-24 DIAGNOSIS — Z7901 Long term (current) use of anticoagulants: Secondary | ICD-10-CM

## 2014-07-24 DIAGNOSIS — I4891 Unspecified atrial fibrillation: Secondary | ICD-10-CM

## 2014-07-24 DIAGNOSIS — I749 Embolism and thrombosis of unspecified artery: Secondary | ICD-10-CM

## 2014-07-24 DIAGNOSIS — I743 Embolism and thrombosis of arteries of the lower extremities: Secondary | ICD-10-CM

## 2014-07-24 DIAGNOSIS — Z5181 Encounter for therapeutic drug level monitoring: Secondary | ICD-10-CM | POA: Diagnosis not present

## 2014-07-24 DIAGNOSIS — I480 Paroxysmal atrial fibrillation: Secondary | ICD-10-CM

## 2014-07-24 LAB — POCT INR: INR: 3.4

## 2014-07-24 MED ORDER — WARFARIN SODIUM 4 MG PO TABS: 4.0000 mg | ORAL_TABLET | Freq: Every day | ORAL | Status: DC

## 2014-07-24 NOTE — Telephone Encounter (Signed)
LRF 12/28 330 + 2.  LOV 05/01/14  OK refill?

## 2014-07-26 NOTE — Telephone Encounter (Signed)
Okay to refill? 

## 2014-07-26 NOTE — Telephone Encounter (Signed)
rx called in

## 2014-07-31 ENCOUNTER — Encounter: Payer: Self-pay | Admitting: Family Medicine

## 2014-07-31 ENCOUNTER — Ambulatory Visit: Payer: Medicare Other | Admitting: Family Medicine

## 2014-08-15 ENCOUNTER — Encounter: Payer: Self-pay | Admitting: Cardiovascular Disease

## 2014-08-15 ENCOUNTER — Ambulatory Visit (INDEPENDENT_AMBULATORY_CARE_PROVIDER_SITE_OTHER): Payer: Medicare Other | Admitting: Cardiovascular Disease

## 2014-08-15 VITALS — BP 164/80 | HR 95 | Ht 64.0 in | Wt 150.0 lb

## 2014-08-15 DIAGNOSIS — I10 Essential (primary) hypertension: Secondary | ICD-10-CM

## 2014-08-15 DIAGNOSIS — I743 Embolism and thrombosis of arteries of the lower extremities: Secondary | ICD-10-CM | POA: Diagnosis not present

## 2014-08-15 DIAGNOSIS — I739 Peripheral vascular disease, unspecified: Secondary | ICD-10-CM

## 2014-08-15 DIAGNOSIS — I482 Chronic atrial fibrillation, unspecified: Secondary | ICD-10-CM

## 2014-08-15 DIAGNOSIS — I749 Embolism and thrombosis of unspecified artery: Secondary | ICD-10-CM

## 2014-08-15 DIAGNOSIS — Z7901 Long term (current) use of anticoagulants: Secondary | ICD-10-CM

## 2014-08-15 DIAGNOSIS — E785 Hyperlipidemia, unspecified: Secondary | ICD-10-CM

## 2014-08-15 MED ORDER — AMLODIPINE BESYLATE 10 MG PO TABS: 10.0000 mg | ORAL_TABLET | Freq: Every day | ORAL | Status: DC

## 2014-08-15 NOTE — Progress Notes (Signed)
Patient ID: SADEE OSLAND, female   DOB: Jul 04, 1928, 81 y.o.   MRN: [id number]      SUBJECTIVE: The patient is an 81 year old woman who I am meeting for the first time today. She has a history of chronic atrial fibrillation, essential hypertension, hyperlipidemia, and peripheral vascular disease. Lipid panel on 05/01/14 demonstrated total cholesterol 152, triglycerides 80, HDL 45, LDL 91.  ECG performed in the office today demonstrates atrial fibrillation, heart rate 94 bpm, with a diffuse ST segment and T-wave abnormality.  She denies chest pain, shortness of breath, palpitations, and leg swelling. She only complains of a headache. She is here with her daughter.   Review of Systems: As per "subjective", otherwise negative.  No Known Allergies  Current Outpatient Prescriptions  Medication Sig Dispense Refill  . amLODipine (NORVASC) 5 MG tablet Take 1 1/2 tablets daily 135 tablet 2  . aspirin 81 MG tablet Take 81 mg by mouth daily.    Marland Kitchen donepezil (ARICEPT) 10 MG tablet TAKE ONE TABLET BY MOUTH AT BEDTIME 30 tablet 6  . LORazepam (ATIVAN) 1 MG tablet TAKE ONE TABLET BY MOUTH AT BEDTIME AS NEEDED 30 tablet 0  . Memantine HCl ER (NAMENDA XR TITRATION PACK) 7 & 14 & 21 &28 MG CP24 Take by mouth.    . metoprolol tartrate (LOPRESSOR) 25 MG tablet Take 1 tablet (25 mg total) by mouth 2 (two) times daily. 60 tablet 6  . pravastatin (PRAVACHOL) 10 MG tablet Take 1 tablet (10 mg total) by mouth daily. 90 tablet 2  . QUEtiapine (SEROQUEL) 50 MG tablet Take 1 tablet (50 mg total) by mouth at bedtime. 90 tablet 1  . ranitidine (ZANTAC) 150 MG tablet TAKE ONE TABLET BY MOUTH TWICE DAILY 180 tablet 3  . warfarin (COUMADIN) 2.5 MG tablet Take 1 tablet (2.5 mg total) by mouth as directed. 60 tablet 3  . warfarin (COUMADIN) 4 MG tablet Take 1 tablet (4 mg total) by mouth daily. 60 tablet 6   No current facility-administered medications for this visit.    Past Medical History  Diagnosis Date  . Atrial  fibrillation     chronic requiring anticoagulation  . Hypertension   . Chronic kidney disease     Stage III; creatinine of 1.7 in 04/2007 and 1.5 2009; 1.7 in 04/2010  . Anemia     Hemoglobin of 11.6 in 01/2010; 12.1 in 08/2010  . Hyperlipidemia     Total cholesterol of 226, triglycerides of 90, HDL of 49 and LDL of 159 in 01/2010  . Chronic anticoagulation     Past Surgical History  Procedure Laterality Date  . Appendectomy    . Partial hysterectomy    . Bunionectomy  1995    History   Social History  . Marital Status: Single    Spouse Name: N/A  . Number of Children: N/A  . Years of Education: N/A   Occupational History  . 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 Smoking status: Never Smoker   . Smokeless tobacco: Never Used  . Alcohol Use: No  . Drug Use: No  . Sexual Activity: Not on file   Other Topics Concern  . Not on file   Social History Narrative     Filed Vitals:   08/15/14 1024  BP: 164/80  Pulse: 95  Height:  (1.626 m)  Weight: 150 lb (68.04 kg)  SpO2: 97%    PHYSICAL EXAM General:  NAD HEENT: Normal. Neck: No JVD, no thyromegaly. Lungs: Clear to auscultation bilaterally with normal respiratory effort. CV: Normal rate, irregular rhythm, normal S1/S2, no S3, no murmur. No pretibial or periankle edema.  No carotid bruit.   Abdomen: Soft, nontender, no distention.  Neurologic: Alert and oriented.  Psych: Normal affect. Skin: Normal. Musculoskeletal: No gross deformities. Extremities: No clubbing or cyanosis.   ECG: Most recent ECG reviewed.      ASSESSMENT AND PLAN: 1. Chronic atrial fibrillation: HR controlled. Continue metoprolol 25 mg bid for HR control and warfarin for anticoagulation. INR 3.4 on 07/24/14.  2. Essential HTN: Elevated today. Increase amlodipine to 10 mg.  3. PVD: No claudication. Continue ASA and statin therapy.  4. Hyperlipidemia: Most recent results noted above. Continue pravastatin  10 mg.  Dispo: f/u 1 year.  Prentice DockerSuresh Liyla Radliff, M.D., F.A.C.C.

## 2014-08-15 NOTE — Patient Instructions (Signed)
Your physician wants you to follow-up in: 1 year with Dr Reggy EyeKoneswaran You will receive a reminder letter in the mail two months in advance. If you don't receive a letter, please call our office to schedule the follow-up appointment.  INCREASE Amlodipine to 10 mg daily    Thank you for choosing [facility] !

## 2014-08-21 ENCOUNTER — Ambulatory Visit (INDEPENDENT_AMBULATORY_CARE_PROVIDER_SITE_OTHER): Payer: Medicare Other | Admitting: *Deleted

## 2014-08-21 DIAGNOSIS — Z5181 Encounter for therapeutic drug level monitoring: Secondary | ICD-10-CM | POA: Diagnosis not present

## 2014-08-21 DIAGNOSIS — I4891 Unspecified atrial fibrillation: Secondary | ICD-10-CM

## 2014-08-21 DIAGNOSIS — I743 Embolism and thrombosis of arteries of the lower extremities: Secondary | ICD-10-CM | POA: Diagnosis not present

## 2014-08-21 DIAGNOSIS — Z7901 Long term (current) use of anticoagulants: Secondary | ICD-10-CM

## 2014-08-21 DIAGNOSIS — I749 Embolism and thrombosis of unspecified artery: Secondary | ICD-10-CM

## 2014-08-21 DIAGNOSIS — I480 Paroxysmal atrial fibrillation: Secondary | ICD-10-CM

## 2014-08-21 LAB — POCT INR: INR: 4.3

## 2014-08-27 ENCOUNTER — Other Ambulatory Visit: Payer: Self-pay | Admitting: Family Medicine

## 2014-08-28 NOTE — Telephone Encounter (Signed)
?   OK to Refill - LOV 05/01/14 Last RF 07/26/14 (Pt dismissed for past due balance)

## 2014-08-28 NOTE — Telephone Encounter (Signed)
Okay to refill give 2 

## 2014-09-16 ENCOUNTER — Ambulatory Visit (INDEPENDENT_AMBULATORY_CARE_PROVIDER_SITE_OTHER): Payer: Medicare Other | Admitting: *Deleted

## 2014-09-16 DIAGNOSIS — I749 Embolism and thrombosis of unspecified artery: Secondary | ICD-10-CM

## 2014-09-16 DIAGNOSIS — I743 Embolism and thrombosis of arteries of the lower extremities: Secondary | ICD-10-CM | POA: Diagnosis not present

## 2014-09-16 DIAGNOSIS — Z5181 Encounter for therapeutic drug level monitoring: Secondary | ICD-10-CM

## 2014-09-16 DIAGNOSIS — I4891 Unspecified atrial fibrillation: Secondary | ICD-10-CM | POA: Diagnosis not present

## 2014-09-16 DIAGNOSIS — Z7901 Long term (current) use of anticoagulants: Secondary | ICD-10-CM | POA: Diagnosis not present

## 2014-09-16 LAB — POCT INR: INR: 3.3

## 2014-09-16 MED ORDER — WARFARIN SODIUM 4 MG PO TABS: 4.0000 mg | ORAL_TABLET | Freq: Every day | ORAL | Status: DC

## 2014-09-21 ENCOUNTER — Other Ambulatory Visit: Payer: Self-pay | Admitting: Family Medicine

## 2014-09-23 NOTE — Telephone Encounter (Signed)
Medication filled x1 with no refills.   Requires office visit before any further refills can be given.   Letter sent.  

## 2014-10-14 ENCOUNTER — Ambulatory Visit (INDEPENDENT_AMBULATORY_CARE_PROVIDER_SITE_OTHER): Payer: Medicare Other | Admitting: *Deleted

## 2014-10-14 DIAGNOSIS — Z7901 Long term (current) use of anticoagulants: Secondary | ICD-10-CM

## 2014-10-14 DIAGNOSIS — I743 Embolism and thrombosis of arteries of the lower extremities: Secondary | ICD-10-CM

## 2014-10-14 DIAGNOSIS — Z5181 Encounter for therapeutic drug level monitoring: Secondary | ICD-10-CM | POA: Diagnosis not present

## 2014-10-14 DIAGNOSIS — I4891 Unspecified atrial fibrillation: Secondary | ICD-10-CM

## 2014-10-14 DIAGNOSIS — I749 Embolism and thrombosis of unspecified artery: Secondary | ICD-10-CM

## 2014-10-14 LAB — POCT INR: INR: 1.4

## 2014-10-28 ENCOUNTER — Ambulatory Visit (INDEPENDENT_AMBULATORY_CARE_PROVIDER_SITE_OTHER): Payer: Medicare Other | Admitting: *Deleted

## 2014-10-28 DIAGNOSIS — Z5181 Encounter for therapeutic drug level monitoring: Secondary | ICD-10-CM | POA: Diagnosis not present

## 2014-10-28 DIAGNOSIS — Z7901 Long term (current) use of anticoagulants: Secondary | ICD-10-CM

## 2014-10-28 DIAGNOSIS — I743 Embolism and thrombosis of arteries of the lower extremities: Secondary | ICD-10-CM

## 2014-10-28 DIAGNOSIS — I749 Embolism and thrombosis of unspecified artery: Secondary | ICD-10-CM

## 2014-10-28 DIAGNOSIS — I4891 Unspecified atrial fibrillation: Secondary | ICD-10-CM

## 2014-10-28 LAB — POCT INR: INR: 2.4

## 2014-10-29 ENCOUNTER — Other Ambulatory Visit: Payer: Self-pay | Admitting: Family Medicine

## 2014-10-29 NOTE — Telephone Encounter (Signed)
Refill denied.   Requires office visit before any further refills can be given.   Letter has been sent with no reply.

## 2014-10-31 ENCOUNTER — Other Ambulatory Visit: Payer: Self-pay | Admitting: Family Medicine

## 2014-11-01 ENCOUNTER — Other Ambulatory Visit: Payer: Self-pay | Admitting: Family Medicine

## 2014-11-01 NOTE — Telephone Encounter (Signed)
Daughter came in asking for refills.  Refills ahd been sent by pharmacy.  One month supplies doen and reminded due for office visit.

## 2014-11-01 NOTE — Telephone Encounter (Signed)
Medication refilled per protocol.  Told daughter time for mother to be seen here for routine appt.

## 2014-11-06 ENCOUNTER — Encounter: Payer: Self-pay | Admitting: Family Medicine

## 2014-11-06 ENCOUNTER — Ambulatory Visit (INDEPENDENT_AMBULATORY_CARE_PROVIDER_SITE_OTHER): Payer: Medicare Other | Admitting: Family Medicine

## 2014-11-06 VITALS — BP 144/88 | HR 86 | Temp 97.6°F | Resp 16 | Ht 64.0 in | Wt 145.0 lb

## 2014-11-06 DIAGNOSIS — I10 Essential (primary) hypertension: Secondary | ICD-10-CM

## 2014-11-06 DIAGNOSIS — D649 Anemia, unspecified: Secondary | ICD-10-CM | POA: Diagnosis not present

## 2014-11-06 DIAGNOSIS — I743 Embolism and thrombosis of arteries of the lower extremities: Secondary | ICD-10-CM | POA: Diagnosis not present

## 2014-11-06 DIAGNOSIS — N183 Chronic kidney disease, stage 3 unspecified: Secondary | ICD-10-CM

## 2014-11-06 DIAGNOSIS — F0391 Unspecified dementia with behavioral disturbance: Secondary | ICD-10-CM

## 2014-11-06 DIAGNOSIS — G4700 Insomnia, unspecified: Secondary | ICD-10-CM | POA: Diagnosis not present

## 2014-11-06 DIAGNOSIS — F5104 Psychophysiologic insomnia: Secondary | ICD-10-CM

## 2014-11-06 DIAGNOSIS — E785 Hyperlipidemia, unspecified: Secondary | ICD-10-CM | POA: Diagnosis not present

## 2014-11-06 LAB — CBC WITH DIFFERENTIAL/PLATELET
BASOS ABS: 0.1 10*3/uL (ref 0.0–0.1)
BASOS PCT: 1 % (ref 0–1)
EOS ABS: 2.5 10*3/uL — AB (ref 0.0–0.7)
Eosinophils Relative: 25 % — ABNORMAL HIGH (ref 0–5)
HCT: 36.9 % (ref 36.0–46.0)
Hemoglobin: 12.3 g/dL (ref 12.0–15.0)
Lymphocytes Relative: 32 % (ref 12–46)
Lymphs Abs: 3.2 10*3/uL (ref 0.7–4.0)
MCH: 31.5 pg (ref 26.0–34.0)
MCHC: 33.3 g/dL (ref 30.0–36.0)
MCV: 94.6 fL (ref 78.0–100.0)
MONOS PCT: 8 % (ref 3–12)
MPV: 10.2 fL (ref 8.6–12.4)
Monocytes Absolute: 0.8 10*3/uL (ref 0.1–1.0)
NEUTROS PCT: 34 % — AB (ref 43–77)
Neutro Abs: 3.4 10*3/uL (ref 1.7–7.7)
Platelets: 259 10*3/uL (ref 150–400)
RBC: 3.9 MIL/uL (ref 3.87–5.11)
RDW: 14 % (ref 11.5–15.5)
WBC: 9.9 10*3/uL (ref 4.0–10.5)

## 2014-11-06 LAB — COMPREHENSIVE METABOLIC PANEL
ALK PHOS: 75 U/L (ref 39–117)
AST: 16 U/L (ref 0–37)
Albumin: 3.9 g/dL (ref 3.5–5.2)
BUN: 23 mg/dL (ref 6–23)
CO2: 21 mEq/L (ref 19–32)
CREATININE: 1.43 mg/dL — AB (ref 0.50–1.10)
Calcium: 9.4 mg/dL (ref 8.4–10.5)
Chloride: 111 mEq/L (ref 96–112)
Glucose, Bld: 89 mg/dL (ref 70–99)
Potassium: 3.6 mEq/L (ref 3.5–5.3)
Sodium: 144 mEq/L (ref 135–145)
Total Bilirubin: 0.4 mg/dL (ref 0.2–1.2)
Total Protein: 7.2 g/dL (ref 6.0–8.3)

## 2014-11-06 LAB — LIPID PANEL
Cholesterol: 184 mg/dL (ref 0–200)
HDL: 57 mg/dL (ref >=46)
LDL Cholesterol: 106 mg/dL — ABNORMAL HIGH (ref 0–99)
Total CHOL/HDL Ratio: 3.2 Ratio
Triglycerides: 107 mg/dL (ref <150)
VLDL: 21 mg/dL (ref 0–40)

## 2014-11-06 MED ORDER — LORAZEPAM 1 MG PO TABS: 1.0000 mg | ORAL_TABLET | Freq: Every evening | ORAL | Status: DC | PRN

## 2014-11-06 MED ORDER — QUETIAPINE FUMARATE 50 MG PO TABS: ORAL_TABLET | ORAL | Status: DC

## 2014-11-06 MED ORDER — DONEPEZIL HCL 10 MG PO TABS: ORAL_TABLET | ORAL | Status: DC

## 2014-11-06 MED ORDER — PRAVASTATIN SODIUM 10 MG PO TABS: 10.0000 mg | ORAL_TABLET | Freq: Every day | ORAL | Status: DC

## 2014-11-06 MED ORDER — METOPROLOL TARTRATE 25 MG PO TABS: 25.0000 mg | ORAL_TABLET | Freq: Two times a day (BID) | ORAL | Status: DC

## 2014-11-06 NOTE — Assessment & Plan Note (Signed)
Recheck renal function. ?

## 2014-11-06 NOTE — Assessment & Plan Note (Signed)
Chronic insomnia in setting of dementia Continue seroquel and ativan

## 2014-11-06 NOTE — Patient Instructions (Signed)
Start the namzeric pack  Continue the other medications We will call with lab results F/U 6 months- Physical

## 2014-11-06 NOTE — Assessment & Plan Note (Signed)
Restart Namenda, given start pack again in office, discussed how to use, plan to change pt to [employer]

## 2014-11-06 NOTE — Progress Notes (Signed)
Patient ID: [name], female   DOB: 1928-07-02, 81 y.o.   MRN: [id number]   Subjective:    Patient ID: Isabel ShortsJessie M [name], female    DOB: 1928-07-02, 81 y.o.   MRN: [id number]  Patient presents for Medication Review/ Refill  patient here to follow-up medications. She has been doing fairly well at home. Her daughter states that she is eating fairly well they're not having difficulties with her bowels. Her weight is actually down 5 pounds. She was recently seen by her cardiologist and her blood pressure medicine was adjusted due to hypertension. Her behavior has been stable. They're using the Seroquel at bedtime occasionally have to give one and a half tablets she is also still using her Ativan for sleep. Her daughter became confused after she ran out of the Namenda therefore she is only been on Aricept the past couple months.    Review Of Systems: unable to obtain- dementia   GEN- denies fatigue, fever, weight loss,weakness, recent illness HEENT- denies eye drainage, change in vision, nasal discharge, CVS- denies chest pain, palpitations RESP- denies SOB, cough, wheeze ABD- denies N/V, change in stools, abd pain GU- denies dysuria, hematuria, dribbling, incontinence MSK- denies joint pain, muscle aches, injury Neuro- denies headache, dizziness, syncope, seizure activity       Objective:    BP 144/88 mmHg  Pulse 86  Temp(Src) 97.6 F (36.4 C) (Oral)  Resp 16  Ht 5\' 4"  (1.626 m)  Wt 145 lb (65.772 kg)  BMI 24.88 kg/m2 GEN- NAD, alert and oriented x2 HEENT- PERRL, EOMI, non injected sclera, pink conjunctiva, MMM, oropharynx clear,  CVS- irregular rhythm, normal rate, 2/6 SEM RESP-CTAB EXT-+venous stasis changes pedal edema Pulses- Radial 2+ Psych- normal affect and mood, Pleasant,         Assessment & Plan:      Problem List Items Addressed This Visit    Hypertension - Primary   Relevant Medications   pravastatin (PRAVACHOL) 10 MG tablet   metoprolol tartrate (LOPRESSOR)  25 MG tablet   Other Relevant Orders   CBC with Differential/Platelet   Comprehensive metabolic panel   Hyperlipidemia   Relevant Medications   pravastatin (PRAVACHOL) 10 MG tablet   metoprolol tartrate (LOPRESSOR) 25 MG tablet   Other Relevant Orders   Lipid panel   Dementia   Relevant Medications   QUEtiapine (SEROQUEL) 50 MG tablet   LORazepam (ATIVAN) 1 MG tablet   donepezil (ARICEPT) 10 MG tablet   Chronic kidney disease   Anemia      Note: This dictation was prepared with Dragon dictation along with smaller phrase technology. Any transcriptional errors that result from this process are unintentional.

## 2014-11-06 NOTE — Assessment & Plan Note (Signed)
BP much improved, continue current medications.

## 2014-11-18 ENCOUNTER — Ambulatory Visit (INDEPENDENT_AMBULATORY_CARE_PROVIDER_SITE_OTHER): Payer: Medicare Other | Admitting: *Deleted

## 2014-11-18 DIAGNOSIS — Z5181 Encounter for therapeutic drug level monitoring: Secondary | ICD-10-CM | POA: Diagnosis not present

## 2014-11-18 DIAGNOSIS — Z7901 Long term (current) use of anticoagulants: Secondary | ICD-10-CM | POA: Diagnosis not present

## 2014-11-18 DIAGNOSIS — I743 Embolism and thrombosis of arteries of the lower extremities: Secondary | ICD-10-CM

## 2014-11-18 DIAGNOSIS — I4891 Unspecified atrial fibrillation: Secondary | ICD-10-CM | POA: Diagnosis not present

## 2014-11-18 DIAGNOSIS — I749 Embolism and thrombosis of unspecified artery: Secondary | ICD-10-CM

## 2014-11-18 LAB — POCT INR: INR: 2.7

## 2014-12-05 ENCOUNTER — Telehealth: Payer: Self-pay | Admitting: *Deleted

## 2014-12-05 NOTE — Telephone Encounter (Signed)
Call placed to patient daughter, [name].   States that patient has been taking Namenda Taper packa nd is currently on  PO QD.   States that she requires refill, but was told that medications may be changed.   Patient is also currently taking Aricept  QD. Per chart notes, plan is to change to Namzeric.   Ok to send in [place]?

## 2014-12-05 NOTE — Telephone Encounter (Signed)
Pt daughter called back stating needing a refill on her Namenda states you were going to put her on some other medication to replace the namenda.  [place]

## 2014-12-06 MED ORDER — MEMANTINE HCL-DONEPEZIL HCL ER 28-10 MG PO CP24: 1.0000 | ORAL_CAPSULE | Freq: Every day | ORAL | Status: DC

## 2014-12-06 NOTE — Telephone Encounter (Signed)
Call placed to [name]. [name] made aware to stop Aricept and Namenda. Begin combination Namzeric.   Verified pharmacy is [facility] in [place]. Prescription sent to pharmacy.

## 2014-12-06 NOTE — Telephone Encounter (Signed)
Yes change to Namzeric 1 tab daily

## 2014-12-18 ENCOUNTER — Ambulatory Visit (INDEPENDENT_AMBULATORY_CARE_PROVIDER_SITE_OTHER): Payer: Medicare Other | Admitting: *Deleted

## 2014-12-18 DIAGNOSIS — Z5181 Encounter for therapeutic drug level monitoring: Secondary | ICD-10-CM

## 2014-12-18 DIAGNOSIS — I4891 Unspecified atrial fibrillation: Secondary | ICD-10-CM | POA: Diagnosis not present

## 2014-12-18 DIAGNOSIS — I749 Embolism and thrombosis of unspecified artery: Secondary | ICD-10-CM

## 2014-12-18 DIAGNOSIS — Z7901 Long term (current) use of anticoagulants: Secondary | ICD-10-CM | POA: Diagnosis not present

## 2014-12-18 DIAGNOSIS — I743 Embolism and thrombosis of arteries of the lower extremities: Secondary | ICD-10-CM

## 2014-12-18 LAB — POCT INR: INR: 2.6

## 2015-02-03 ENCOUNTER — Other Ambulatory Visit: Payer: Self-pay | Admitting: Family Medicine

## 2015-02-04 NOTE — Telephone Encounter (Signed)
Medication refilled per protocol. 

## 2015-02-12 ENCOUNTER — Ambulatory Visit (INDEPENDENT_AMBULATORY_CARE_PROVIDER_SITE_OTHER): Payer: Medicare Other | Admitting: *Deleted

## 2015-02-12 DIAGNOSIS — I743 Embolism and thrombosis of arteries of the lower extremities: Secondary | ICD-10-CM | POA: Diagnosis not present

## 2015-02-12 DIAGNOSIS — Z7901 Long term (current) use of anticoagulants: Secondary | ICD-10-CM | POA: Diagnosis not present

## 2015-02-12 DIAGNOSIS — Z5181 Encounter for therapeutic drug level monitoring: Secondary | ICD-10-CM | POA: Diagnosis not present

## 2015-02-12 DIAGNOSIS — I4891 Unspecified atrial fibrillation: Secondary | ICD-10-CM

## 2015-02-12 DIAGNOSIS — I749 Embolism and thrombosis of unspecified artery: Secondary | ICD-10-CM

## 2015-02-12 LAB — POCT INR: INR: 3

## 2015-02-13 ENCOUNTER — Encounter: Payer: Self-pay | Admitting: Family Medicine

## 2015-03-21 ENCOUNTER — Telehealth: Payer: Self-pay | Admitting: Cardiovascular Disease

## 2015-03-21 ENCOUNTER — Other Ambulatory Visit: Payer: Self-pay | Admitting: *Deleted

## 2015-03-21 MED ORDER — WARFARIN SODIUM 2.5 MG PO TABS: 2.5000 mg | ORAL_TABLET | ORAL | Status: DC

## 2015-03-21 MED ORDER — WARFARIN SODIUM 4 MG PO TABS: 4.0000 mg | ORAL_TABLET | Freq: Every day | ORAL | Status: DC

## 2015-03-21 NOTE — Telephone Encounter (Signed)
Please advise 

## 2015-03-21 NOTE — Telephone Encounter (Signed)
Please refer to anticoagulation refill 

## 2015-03-21 NOTE — Telephone Encounter (Signed)
°*  STAT* If patient is at the pharmacy, call can be transferred to refill team.   1. Which medications need to be refilled? (please list name of each medication and dose if known) Warfarin 2mg  2. Which pharmacy/location (including street and city if local pharmacy) is medication to be sent to [facility] 3. Do they need a 30 day or 90 day supply?30

## 2015-03-28 ENCOUNTER — Other Ambulatory Visit: Payer: Self-pay | Admitting: Family Medicine

## 2015-03-31 NOTE — Telephone Encounter (Signed)
okay

## 2015-03-31 NOTE — Telephone Encounter (Signed)
Ok to refill??  Last office visit/ refill 11/06/2014, #3 refills.

## 2015-03-31 NOTE — Telephone Encounter (Signed)
Medication called to pharmacy. 

## 2015-04-07 ENCOUNTER — Ambulatory Visit (INDEPENDENT_AMBULATORY_CARE_PROVIDER_SITE_OTHER): Payer: Medicare Other | Admitting: *Deleted

## 2015-04-07 DIAGNOSIS — Z5181 Encounter for therapeutic drug level monitoring: Secondary | ICD-10-CM

## 2015-04-07 DIAGNOSIS — I743 Embolism and thrombosis of arteries of the lower extremities: Secondary | ICD-10-CM

## 2015-04-07 DIAGNOSIS — Z7901 Long term (current) use of anticoagulants: Secondary | ICD-10-CM

## 2015-04-07 DIAGNOSIS — I749 Embolism and thrombosis of unspecified artery: Secondary | ICD-10-CM

## 2015-04-07 DIAGNOSIS — I4891 Unspecified atrial fibrillation: Secondary | ICD-10-CM

## 2015-04-07 LAB — POCT INR: INR: 3.7

## 2015-04-30 ENCOUNTER — Ambulatory Visit (INDEPENDENT_AMBULATORY_CARE_PROVIDER_SITE_OTHER): Payer: Medicare Other | Admitting: *Deleted

## 2015-04-30 DIAGNOSIS — I749 Embolism and thrombosis of unspecified artery: Secondary | ICD-10-CM

## 2015-04-30 DIAGNOSIS — Z5181 Encounter for therapeutic drug level monitoring: Secondary | ICD-10-CM | POA: Diagnosis not present

## 2015-04-30 DIAGNOSIS — I743 Embolism and thrombosis of arteries of the lower extremities: Secondary | ICD-10-CM

## 2015-04-30 DIAGNOSIS — Z7901 Long term (current) use of anticoagulants: Secondary | ICD-10-CM | POA: Diagnosis not present

## 2015-04-30 DIAGNOSIS — I4891 Unspecified atrial fibrillation: Secondary | ICD-10-CM

## 2015-04-30 LAB — POCT INR: INR: 3.3

## 2015-05-01 ENCOUNTER — Ambulatory Visit (INDEPENDENT_AMBULATORY_CARE_PROVIDER_SITE_OTHER): Payer: Medicare Other | Admitting: Family Medicine

## 2015-05-01 ENCOUNTER — Encounter: Payer: Self-pay | Admitting: Family Medicine

## 2015-05-01 VITALS — BP 142/70 | HR 78 | Temp 97.9°F | Resp 16 | Ht 64.0 in | Wt 152.0 lb

## 2015-05-01 DIAGNOSIS — Z23 Encounter for immunization: Secondary | ICD-10-CM | POA: Diagnosis not present

## 2015-05-01 DIAGNOSIS — Z0000 Encounter for general adult medical examination without abnormal findings: Secondary | ICD-10-CM | POA: Diagnosis not present

## 2015-05-01 DIAGNOSIS — I10 Essential (primary) hypertension: Secondary | ICD-10-CM

## 2015-05-01 DIAGNOSIS — G4700 Insomnia, unspecified: Secondary | ICD-10-CM | POA: Diagnosis not present

## 2015-05-01 DIAGNOSIS — F0391 Unspecified dementia with behavioral disturbance: Secondary | ICD-10-CM | POA: Diagnosis not present

## 2015-05-01 DIAGNOSIS — N183 Chronic kidney disease, stage 3 unspecified: Secondary | ICD-10-CM

## 2015-05-01 DIAGNOSIS — F5104 Psychophysiologic insomnia: Secondary | ICD-10-CM

## 2015-05-01 LAB — BASIC METABOLIC PANEL
BUN: 21 mg/dL (ref 7–25)
CHLORIDE: 106 mmol/L (ref 98–110)
CO2: 21 mmol/L (ref 20–31)
CREATININE: 1.3 mg/dL — AB (ref 0.60–0.88)
Calcium: 9.1 mg/dL (ref 8.6–10.4)
GLUCOSE: 86 mg/dL (ref 70–99)
POTASSIUM: 3.6 mmol/L (ref 3.5–5.3)
Sodium: 142 mmol/L (ref 135–146)

## 2015-05-01 LAB — CBC WITH DIFFERENTIAL/PLATELET
BASOS ABS: 0 10*3/uL (ref 0.0–0.1)
Basophils Relative: 0 % (ref 0–1)
EOS PCT: 21 % — AB (ref 0–5)
Eosinophils Absolute: 1.9 10*3/uL — ABNORMAL HIGH (ref 0.0–0.7)
HEMATOCRIT: 33.7 % — AB (ref 36.0–46.0)
HEMOGLOBIN: 11.3 g/dL — AB (ref 12.0–15.0)
LYMPHS ABS: 3.1 10*3/uL (ref 0.7–4.0)
LYMPHS PCT: 34 % (ref 12–46)
MCH: 30.6 pg (ref 26.0–34.0)
MCHC: 33.5 g/dL (ref 30.0–36.0)
MCV: 91.3 fL (ref 78.0–100.0)
MONO ABS: 0.8 10*3/uL (ref 0.1–1.0)
MPV: 10.5 fL (ref 8.6–12.4)
Monocytes Relative: 9 % (ref 3–12)
NEUTROS ABS: 3.3 10*3/uL (ref 1.7–7.7)
Neutrophils Relative %: 36 % — ABNORMAL LOW (ref 43–77)
Platelets: 286 10*3/uL (ref 150–400)
RBC: 3.69 MIL/uL — AB (ref 3.87–5.11)
RDW: 14.6 % (ref 11.5–15.5)
WBC: 9.1 10*3/uL (ref 4.0–10.5)

## 2015-05-01 MED ORDER — LORAZEPAM 1 MG PO TABS: ORAL_TABLET | ORAL | Status: DC

## 2015-05-01 MED ORDER — PRAVASTATIN SODIUM 10 MG PO TABS: 10.0000 mg | ORAL_TABLET | Freq: Every day | ORAL | Status: DC

## 2015-05-01 MED ORDER — MEMANTINE HCL-DONEPEZIL HCL ER 28-10 MG PO CP24: 1.0000 | ORAL_CAPSULE | Freq: Every day | ORAL | Status: DC

## 2015-05-01 MED ORDER — WARFARIN SODIUM 2.5 MG PO TABS: 2.5000 mg | ORAL_TABLET | ORAL | Status: DC

## 2015-05-01 MED ORDER — RANITIDINE HCL 150 MG PO TABS: 150.0000 mg | ORAL_TABLET | Freq: Two times a day (BID) | ORAL | Status: DC

## 2015-05-01 MED ORDER — AMLODIPINE BESYLATE 10 MG PO TABS: 10.0000 mg | ORAL_TABLET | Freq: Every day | ORAL | Status: DC

## 2015-05-01 NOTE — Patient Instructions (Signed)
Flu shot given Continue current medications We will call with lab results F/U 6 months

## 2015-05-01 NOTE — Progress Notes (Signed)
Patient ID: [name], female   DOB: 1929-02-19, 81 y.o.   MRN: [id number] Subjective:   Patient presents for Medicare Annual/Subsequent preventive examination.   a she here with daughter. She cannot give any information because of her dementia. Daughter states she continues to have problems with her at bedtime where she fights her medication does not want to go to sleep. Often she will just stay up talking. Did not have any problems with her physically.She still thinks that she is working and because she did work third shift in the past does not want to get to sleep.They did have problems getting her Namzeric the  medication was over $200.  She had a mild cold last weekbut no fever she is due for her flu shot.  Viewed last cardiology note   Review Past Medical/Family/Social: Per EMR   Risk Factors  Current exercise habits: None Dietary issues discussed: None  Cardiac risk factors: Obesity HTN,CKD,PVD  Depression Screen  UNABLE  (Note: if answer to either of the following is "Yes", a more complete depression screening is indicated)  Over the past two weeks, have you felt down, depressed or hopeless? No Over the past two weeks, have you felt little interest or pleasure in doing things? No Have you lost interest or pleasure in daily life? No Do you often feel hopeless? No Do you cry easily over simple problems? No   Activities of Daily Living Needs assistance with all due to Dementia  In your present state of health, do you have any difficulty performing the following activities?:  Driving? No  Managing money? No  Feeding yourself? No  Getting from bed to chair? No  Climbing a flight of stairs? No  Preparing food and eating?: No  Bathing or showering? No  Getting dressed: No  Getting to the toilet? No  Using the toilet:No  Moving around from place to place: No  In the past year have you fallen or had a near fall?:No  Are you sexually active? No  Do you have more than one  partner? No   Hearing Difficulties: Yes Do you often ask people to speak up or repeat themselves? No  Do you experience ringing or noises in your ears? No Do you have difficulty understanding soft or whispered voices? No  Do you feel that you have a problem with memory? DEMENTIA Do you often misplace items? No  Do you feel safe at home? Yes  Cognitive Testing - DEMENTIA Alert? Yes Normal Appearance?Yes  Oriented to person? Yes Place? Yes  Time? NO Recall of three objects? No Can perform simple calculations? Yes   Displays appropriate judgment? No  Can read the correct time from a watch face?No   List the Names of Other Physician/Practitioners you currently use: Cardiology    ROS:  GEN- denies fatigue, fever, weight loss,weakness, recent illness HEENT- denies eye drainage, change in vision, nasal discharge, CVS- denies chest pain, palpitations RESP- denies SOB, cough, wheeze ABD- denies N/V, change in stools, abd pain GU- denies dysuria, hematuria, dribbling, incontinence MSK- denies joint pain, muscle aches, injury Neuro- denies headache, dizziness, syncope, seizure activity  PHYSICAL: GEN- NAD, alert and oriented x3 HEENT- PERRL, EOMI, non injected sclera, pink conjunctiva, MMM, oropharynx clear Neck- Supple, no thryomegaly CVS- irregular rhythm, normal rate, 2/6 SEM RESP-CTAB ABD-NABS,soft,nt,nd EXT-+venous stasis changes pedal edema Pulses- Radial 2+    Screening Tests / Date Colonoscopy   - Age exceeded  Mammogram -age exceeded Influenza Vaccine -flu shot today Tetanus/tdap not covered     Assessment:    Annual wellness medicare exam   Plan:    During the course of the visit the patient was educated and counseled about appropriate screening and preventive services including:  See below for instructions about dementia and medication changes. Diet review for nutrition referral? Yes ____ Not Indicated __x__  Patient Instructions (the  written plan) was given to the patient.  Medicare Attestation  I have personally reviewed:  The patient's medical and social history  Their use of alcohol, tobacco or illicit drugs  Their current medications and supplements  The patient's functional ability including ADLs,fall risks, home safety risks, cognitive, and hearing and visual impairment  Diet and physical activities  Evidence for depression or mood disorders  The patient's weight, height, BMI, and visual acuity have been recorded in the chart. I have made referrals, counseling, and provided education to the patient based on review of the above and I have provided the patient with a written personalized care plan for preventive services.

## 2015-05-02 ENCOUNTER — Encounter: Payer: Self-pay | Admitting: Family Medicine

## 2015-05-02 MED ORDER — DONEPEZIL HCL 10 MG PO TABS: 10.0000 mg | ORAL_TABLET | Freq: Every day | ORAL | Status: DC

## 2015-05-02 NOTE — Assessment & Plan Note (Signed)
Consider increasing her Seroquel to 75 mg at bedtime

## 2015-05-02 NOTE — Assessment & Plan Note (Signed)
Unfortunately her insurance does not cover the names there therefore we will have to use them separately. I will restart the Aricept that she has not been taking this and then add Namenda on separately.

## 2015-05-02 NOTE — Assessment & Plan Note (Signed)
Blood pressure looks okay today. 140'S seems to be her baseline.

## 2015-05-06 ENCOUNTER — Other Ambulatory Visit: Payer: Self-pay | Admitting: *Deleted

## 2015-05-13 ENCOUNTER — Encounter: Payer: Medicare Other | Admitting: Family Medicine

## 2015-05-21 ENCOUNTER — Other Ambulatory Visit: Payer: Self-pay | Admitting: *Deleted

## 2015-05-21 ENCOUNTER — Ambulatory Visit (INDEPENDENT_AMBULATORY_CARE_PROVIDER_SITE_OTHER): Payer: Medicare Other | Admitting: Pharmacist

## 2015-05-21 DIAGNOSIS — I743 Embolism and thrombosis of arteries of the lower extremities: Secondary | ICD-10-CM | POA: Diagnosis not present

## 2015-05-21 DIAGNOSIS — I4891 Unspecified atrial fibrillation: Secondary | ICD-10-CM | POA: Diagnosis not present

## 2015-05-21 DIAGNOSIS — Z5181 Encounter for therapeutic drug level monitoring: Secondary | ICD-10-CM

## 2015-05-21 DIAGNOSIS — Z7901 Long term (current) use of anticoagulants: Secondary | ICD-10-CM

## 2015-05-21 DIAGNOSIS — I749 Embolism and thrombosis of unspecified artery: Secondary | ICD-10-CM

## 2015-05-21 LAB — POCT INR: INR: 1.8

## 2015-05-21 MED ORDER — QUETIAPINE FUMARATE 50 MG PO TABS: ORAL_TABLET | ORAL | Status: DC

## 2015-05-21 MED ORDER — WARFARIN SODIUM 4 MG PO TABS: 4.0000 mg | ORAL_TABLET | Freq: Every day | ORAL | Status: DC

## 2015-05-21 MED ORDER — WARFARIN SODIUM 2.5 MG PO TABS: 2.5000 mg | ORAL_TABLET | ORAL | Status: DC

## 2015-06-04 ENCOUNTER — Other Ambulatory Visit: Payer: Self-pay | Admitting: Family Medicine

## 2015-06-04 NOTE — Telephone Encounter (Signed)
Medication refilled per protocol. 

## 2015-06-23 ENCOUNTER — Ambulatory Visit (INDEPENDENT_AMBULATORY_CARE_PROVIDER_SITE_OTHER): Payer: Medicare Other | Admitting: *Deleted

## 2015-06-23 DIAGNOSIS — I743 Embolism and thrombosis of arteries of the lower extremities: Secondary | ICD-10-CM | POA: Diagnosis not present

## 2015-06-23 DIAGNOSIS — I4891 Unspecified atrial fibrillation: Secondary | ICD-10-CM | POA: Diagnosis not present

## 2015-06-23 DIAGNOSIS — Z7901 Long term (current) use of anticoagulants: Secondary | ICD-10-CM

## 2015-06-23 DIAGNOSIS — I749 Embolism and thrombosis of unspecified artery: Secondary | ICD-10-CM

## 2015-06-23 DIAGNOSIS — Z5181 Encounter for therapeutic drug level monitoring: Secondary | ICD-10-CM

## 2015-06-23 LAB — POCT INR: INR: 2.3

## 2015-06-23 MED ORDER — WARFARIN SODIUM 2.5 MG PO TABS: 2.5000 mg | ORAL_TABLET | ORAL | Status: DC

## 2015-07-30 ENCOUNTER — Encounter: Payer: Self-pay | Admitting: Family Medicine

## 2015-08-06 ENCOUNTER — Ambulatory Visit (INDEPENDENT_AMBULATORY_CARE_PROVIDER_SITE_OTHER): Payer: Medicare Other | Admitting: *Deleted

## 2015-08-06 DIAGNOSIS — Z5181 Encounter for therapeutic drug level monitoring: Secondary | ICD-10-CM

## 2015-08-06 DIAGNOSIS — Z7901 Long term (current) use of anticoagulants: Secondary | ICD-10-CM | POA: Diagnosis not present

## 2015-08-06 DIAGNOSIS — I743 Embolism and thrombosis of arteries of the lower extremities: Secondary | ICD-10-CM | POA: Diagnosis not present

## 2015-08-06 DIAGNOSIS — I749 Embolism and thrombosis of unspecified artery: Secondary | ICD-10-CM

## 2015-08-06 DIAGNOSIS — I4891 Unspecified atrial fibrillation: Secondary | ICD-10-CM

## 2015-08-06 LAB — POCT INR: INR: 2.8

## 2015-09-02 ENCOUNTER — Other Ambulatory Visit: Payer: Self-pay | Admitting: Family Medicine

## 2015-09-02 NOTE — Telephone Encounter (Signed)
Refill appropriate and filled per protocol. 

## 2015-09-05 ENCOUNTER — Ambulatory Visit (INDEPENDENT_AMBULATORY_CARE_PROVIDER_SITE_OTHER): Payer: Medicare Other | Admitting: Family Medicine

## 2015-09-05 ENCOUNTER — Encounter: Payer: Self-pay | Admitting: Family Medicine

## 2015-09-05 VITALS — BP 150/88 | HR 76 | Temp 98.2°F | Resp 16 | Ht 64.0 in | Wt 143.0 lb

## 2015-09-05 DIAGNOSIS — I4891 Unspecified atrial fibrillation: Secondary | ICD-10-CM

## 2015-09-05 DIAGNOSIS — I739 Peripheral vascular disease, unspecified: Secondary | ICD-10-CM

## 2015-09-05 DIAGNOSIS — I743 Embolism and thrombosis of arteries of the lower extremities: Secondary | ICD-10-CM

## 2015-09-05 DIAGNOSIS — F5104 Psychophysiologic insomnia: Secondary | ICD-10-CM

## 2015-09-05 DIAGNOSIS — E785 Hyperlipidemia, unspecified: Secondary | ICD-10-CM | POA: Diagnosis not present

## 2015-09-05 DIAGNOSIS — I10 Essential (primary) hypertension: Secondary | ICD-10-CM | POA: Diagnosis not present

## 2015-09-05 DIAGNOSIS — G4700 Insomnia, unspecified: Secondary | ICD-10-CM | POA: Diagnosis not present

## 2015-09-05 DIAGNOSIS — R634 Abnormal weight loss: Secondary | ICD-10-CM

## 2015-09-05 DIAGNOSIS — F0391 Unspecified dementia with behavioral disturbance: Secondary | ICD-10-CM

## 2015-09-05 DIAGNOSIS — M7989 Other specified soft tissue disorders: Secondary | ICD-10-CM

## 2015-09-05 DIAGNOSIS — N183 Chronic kidney disease, stage 3 unspecified: Secondary | ICD-10-CM

## 2015-09-05 LAB — LIPID PANEL
CHOLESTEROL: 183 mg/dL (ref 125–200)
HDL: 76 mg/dL (ref >=46)
LDL Cholesterol: 89 mg/dL (ref <130)
TRIGLYCERIDES: 91 mg/dL (ref <150)
Total CHOL/HDL Ratio: 2.4 Ratio (ref <=5.0)
VLDL: 18 mg/dL (ref <30)

## 2015-09-05 LAB — COMPREHENSIVE METABOLIC PANEL
ALBUMIN: 4.2 g/dL (ref 3.6–5.1)
ALK PHOS: 96 U/L (ref 33–130)
ALT: 8 U/L (ref 6–29)
AST: 19 U/L (ref 10–35)
BUN: 18 mg/dL (ref 7–25)
CHLORIDE: 102 mmol/L (ref 98–110)
CO2: 19 mmol/L — ABNORMAL LOW (ref 20–31)
Calcium: 9.7 mg/dL (ref 8.6–10.4)
Creat: 1.25 mg/dL — ABNORMAL HIGH (ref 0.60–0.88)
Glucose, Bld: 115 mg/dL — ABNORMAL HIGH (ref 70–99)
POTASSIUM: 3.5 mmol/L (ref 3.5–5.3)
Sodium: 140 mmol/L (ref 135–146)
TOTAL PROTEIN: 7.7 g/dL (ref 6.1–8.1)
Total Bilirubin: 0.4 mg/dL (ref 0.2–1.2)

## 2015-09-05 LAB — CBC WITH DIFFERENTIAL/PLATELET
BASOS ABS: 92 {cells}/uL (ref 0–200)
Basophils Relative: 1 %
EOS ABS: 460 {cells}/uL (ref 15–500)
Eosinophils Relative: 5 %
HCT: 37.2 % (ref 35.0–45.0)
HEMOGLOBIN: 12.3 g/dL (ref 12.0–15.0)
LYMPHS ABS: 2576 {cells}/uL (ref 850–3900)
Lymphocytes Relative: 28 %
MCH: 30 pg (ref 27.0–33.0)
MCHC: 33.1 g/dL (ref 32.0–36.0)
MCV: 90.7 fL (ref 80.0–100.0)
MONOS PCT: 9 %
MPV: 11 fL (ref 7.5–12.5)
Monocytes Absolute: 828 cells/uL (ref 200–950)
NEUTROS ABS: 5244 {cells}/uL (ref 1500–7800)
NEUTROS PCT: 57 %
Platelets: 327 10*3/uL (ref 140–400)
RBC: 4.1 MIL/uL (ref 3.80–5.10)
RDW: 14.8 % (ref 11.0–15.0)
WBC: 9.2 10*3/uL (ref 3.8–10.8)

## 2015-09-05 LAB — TSH: TSH: 1.92 mIU/L

## 2015-09-05 MED ORDER — LORAZEPAM 1 MG PO TABS: ORAL_TABLET | ORAL | Status: DC

## 2015-09-05 MED ORDER — FUROSEMIDE 40 MG PO TABS: 40.0000 mg | ORAL_TABLET | Freq: Every day | ORAL | Status: DC

## 2015-09-05 MED ORDER — AMLODIPINE BESYLATE 10 MG PO TABS: 10.0000 mg | ORAL_TABLET | Freq: Every day | ORAL | Status: DC

## 2015-09-05 MED ORDER — DONEPEZIL HCL 10 MG PO TABS: 10.0000 mg | ORAL_TABLET | Freq: Every day | ORAL | Status: DC

## 2015-09-05 MED ORDER — PRAVASTATIN SODIUM 10 MG PO TABS: 10.0000 mg | ORAL_TABLET | Freq: Every day | ORAL | Status: DC

## 2015-09-05 MED ORDER — QUETIAPINE FUMARATE 50 MG PO TABS: 50.0000 mg | ORAL_TABLET | Freq: Every day | ORAL | Status: DC

## 2015-09-05 MED ORDER — METOPROLOL TARTRATE 25 MG PO TABS: 25.0000 mg | ORAL_TABLET | Freq: Two times a day (BID) | ORAL | Status: DC

## 2015-09-05 MED ORDER — RANITIDINE HCL 150 MG PO TABS: 150.0000 mg | ORAL_TABLET | Freq: Two times a day (BID) | ORAL | Status: DC

## 2015-09-05 NOTE — Assessment & Plan Note (Addendum)
Underlying dementia with multiple comorbidities. She also has chronic insomnia. She's been out of her meds for unknown period of time. The Seroquel seems to be less than a week the Ativan seemed to be longer. She also has not been taking the air sat for greater than 2 months. This seems to keep having issues with her pharmacy keeping her medications on tract. We got a call pharmacy today get her medications straightened out. Start her back is Seroquel 50 mg at bedtime along with her Ativan she would actually did fairly well with these. She does need to be on something for her dementia she continues to decline so we can get the insurance to pay for her Aricept will start that and get her back to the combination of Aricept and Namenda  She's had some mild weight loss as well the past 5-1/2 months her daughter states that she is eating well this is likely just secondary to her dementia. I am checking her labs today.

## 2015-09-05 NOTE — Patient Instructions (Addendum)
Restart seroquel Restart Ativan We will try Aricept again Lasix give 40mg  for 3 days in a row  F/U 6 months

## 2015-09-05 NOTE — Assessment & Plan Note (Signed)
Followed by cardiology and they are detaining her Coumadin

## 2015-09-05 NOTE — Assessment & Plan Note (Signed)
She has known peripheral vascular disease but now with some leg swelling which she usually gets intermittently wears compression hose. Seems essentially is not been sleeping she has been up and about more at nighttime. When give her Lasix for 3 days to pull out some of the extra fluid discussed with her daughter drinking and monitoring her intake while she is on the medication.

## 2015-09-05 NOTE — Progress Notes (Signed)
Patient ID: [name], female   DOB: 03/01/1929, 82 y.o.   MRN: [id number]     Subjective:    Patient ID: [name], female    DOB: 06-20-1928, 82 y.o.   MRN: [id number]  Patient presents for 3 month F/U and Medication Refill  Patient here for follow-up on chronic medical process. Her last visit was about 5 months ago. She is underlying dementia with chronic insomnia she tends to stay up very late which were too difficult for her caregivers. She is currently on Seroquel 50 mg at bedtime. She also has underlying anxiety and has been on lorazepam 1 mg for a few years. She was also started back on her Aricept at her last visit Unfortunately she has not had her Seroquel lorazepam or the Aricept for various times. 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 When she was on the medication she did sleep. She's been having increased leg swelling the past couple weeks as well as she has been up on her feet more and arthrodesis typically her legs go down in the morning in day but her compression hose on but since they cannot keep her sitting with her feet elevated there swelling more.  Hypertension she is taking her medications as prescribed her caregivers which are her daughter and her son give her her medications and provide her meals.   She is still followed by cardiology for her A. fib she is on Coumadin therapy  Review Of Systems:  GEN- denies fatigue, fever, weight loss,weakness, recent illness HEENT- denies eye drainage, change in vision, nasal discharge, CVS- denies chest pain, palpitations RESP- denies SOB, cough, wheeze ABD- denies N/V, change in stools, abd pain GU- denies dysuria, hematuria, dribbling, incontinence MSK- denies joint pain, muscle aches, injury Neuro- denies headache, dizziness, syncope, seizure activity       Objective:    BP  150/88 mmHg  Pulse 76  Temp(Src) 98.2 F (36.8 C) (Oral)  Resp 16  Ht  (1.626 m)  Wt 143 lb (64.864 kg)  BMI 24.53 kg/m2 GEN- NAD, alert and orientedx2, pleasantly demented  HEENT- PERRL, EOMI, non injected sclera, pink conjunctiva, MMM, oropharynx clear Neck- Supple, no thyromegaly, no JVD  CVS- irregular rhythem, normal rate no murmur RESP-CTAB ABD-NABS,soft,NT,ND EXT- 1+ non pitting edema bilat  Pulses- Radial, DP- 1+        Assessment & Plan:      Problem List Items Addressed This Visit    Peripheral vascular disease (HCC)    She has known peripheral vascular disease but now with some leg swelling which she usually gets intermittently wears compression hose. Seems essentially is not been sleeping she has been up and about more at nighttime. When give her Lasix for 3 days to pull out some of the extra fluid discussed with her daughter drinking and monitoring her intake while she is on the medication.      Relevant Medications   pravastatin (PRAVACHOL) 10 MG tablet   metoprolol tartrate (LOPRESSOR) 25 MG tablet   furosemide (LASIX) 40 MG tablet   amLODipine (NORVASC) 10 MG tablet   Other Relevant Orders   Lipid panel   Loss of weight   Relevant Orders   TSH   Hypertension   Relevant Medications   pravastatin (PRAVACHOL) 10 MG tablet   metoprolol tartrate (LOPRESSOR)  25 MG tablet   furosemide (LASIX) 40 MG tablet   amLODipine (NORVASC) 10 MG tablet   Other Relevant Orders   CBC with Differential/Platelet   Lipid panel   Hyperlipidemia   Relevant Medications   pravastatin (PRAVACHOL) 10 MG tablet   metoprolol tartrate (LOPRESSOR) 25 MG tablet   furosemide (LASIX) 40 MG tablet   amLODipine (NORVASC) 10 MG tablet   Dementia    Underlying dementia with multiple comorbidities. She also has chronic insomnia. She's been out of her meds for unknown period of time. The Seroquel seems to be less than a week the Ativan seemed to be longer. She also has not been  taking the air sat for greater than 2 months. This seems to keep having issues with her pharmacy keeping her medications on tract. We got a call pharmacy today get her medications straightened out. Start her back is Seroquel 50 mg at bedtime along with her Ativan she would actually did fairly well with these. She does need to be on something for her dementia she continues to decline so we can get the insurance to pay for her Aricept will start that and get her back to the combination of Aricept and Namenda  She's had some mild weight loss as well the past 5-1/2 months her daughter states that she is eating well this is likely just secondary to her dementia. I am checking her labs today.      Relevant Medications   QUEtiapine (SEROQUEL) 50 MG tablet   donepezil (ARICEPT) 10 MG tablet   LORazepam (ATIVAN) 1 MG tablet   Chronic kidney disease   Relevant Orders   CBC with Differential/Platelet   Comprehensive metabolic panel   Chronic insomnia - Primary   ATRIAL FIBRILLATION, CHRONIC    Followed by cardiology and they are detaining her Coumadin      Relevant Medications   pravastatin (PRAVACHOL) 10 MG tablet   metoprolol tartrate (LOPRESSOR) 25 MG tablet   furosemide (LASIX) 40 MG tablet   amLODipine (NORVASC) 10 MG tablet    Other Visit Diagnoses    Leg swelling           Note: This dictation was prepared with Dragon dictation along with smaller phrase technology. Any transcriptional errors that result from this process are unintentional.

## 2015-09-08 ENCOUNTER — Ambulatory Visit (INDEPENDENT_AMBULATORY_CARE_PROVIDER_SITE_OTHER): Payer: Medicare Other | Admitting: *Deleted

## 2015-09-08 DIAGNOSIS — Z7901 Long term (current) use of anticoagulants: Secondary | ICD-10-CM

## 2015-09-08 DIAGNOSIS — Z5181 Encounter for therapeutic drug level monitoring: Secondary | ICD-10-CM

## 2015-09-08 DIAGNOSIS — I4891 Unspecified atrial fibrillation: Secondary | ICD-10-CM | POA: Diagnosis not present

## 2015-09-08 DIAGNOSIS — I743 Embolism and thrombosis of arteries of the lower extremities: Secondary | ICD-10-CM

## 2015-09-08 DIAGNOSIS — I749 Embolism and thrombosis of unspecified artery: Secondary | ICD-10-CM

## 2015-09-08 LAB — POCT INR: INR: 4

## 2015-09-08 MED ORDER — WARFARIN SODIUM 4 MG PO TABS: 4.0000 mg | ORAL_TABLET | Freq: Every day | ORAL | Status: DC

## 2015-09-11 ENCOUNTER — Encounter: Payer: Self-pay | Admitting: *Deleted

## 2015-10-01 ENCOUNTER — Ambulatory Visit (INDEPENDENT_AMBULATORY_CARE_PROVIDER_SITE_OTHER): Payer: Medicare Other | Admitting: *Deleted

## 2015-10-01 DIAGNOSIS — I4891 Unspecified atrial fibrillation: Secondary | ICD-10-CM | POA: Diagnosis not present

## 2015-10-01 DIAGNOSIS — Z5181 Encounter for therapeutic drug level monitoring: Secondary | ICD-10-CM

## 2015-10-01 DIAGNOSIS — I743 Embolism and thrombosis of arteries of the lower extremities: Secondary | ICD-10-CM

## 2015-10-01 DIAGNOSIS — I749 Embolism and thrombosis of unspecified artery: Secondary | ICD-10-CM

## 2015-10-01 DIAGNOSIS — Z7901 Long term (current) use of anticoagulants: Secondary | ICD-10-CM | POA: Diagnosis not present

## 2015-10-01 LAB — POCT INR: INR: 2.2

## 2015-10-01 MED ORDER — WARFARIN SODIUM 2.5 MG PO TABS: 2.5000 mg | ORAL_TABLET | ORAL | Status: DC

## 2015-10-01 MED ORDER — WARFARIN SODIUM 4 MG PO TABS: 4.0000 mg | ORAL_TABLET | Freq: Every day | ORAL | Status: DC

## 2015-10-29 ENCOUNTER — Ambulatory Visit (INDEPENDENT_AMBULATORY_CARE_PROVIDER_SITE_OTHER): Payer: Medicare Other | Admitting: *Deleted

## 2015-10-29 DIAGNOSIS — Z5181 Encounter for therapeutic drug level monitoring: Secondary | ICD-10-CM | POA: Diagnosis not present

## 2015-10-29 DIAGNOSIS — Z7901 Long term (current) use of anticoagulants: Secondary | ICD-10-CM

## 2015-10-29 DIAGNOSIS — I749 Embolism and thrombosis of unspecified artery: Secondary | ICD-10-CM

## 2015-10-29 DIAGNOSIS — I743 Embolism and thrombosis of arteries of the lower extremities: Secondary | ICD-10-CM | POA: Diagnosis not present

## 2015-10-29 DIAGNOSIS — I4891 Unspecified atrial fibrillation: Secondary | ICD-10-CM | POA: Diagnosis not present

## 2015-10-29 LAB — POCT INR: INR: 2

## 2015-12-03 ENCOUNTER — Ambulatory Visit (INDEPENDENT_AMBULATORY_CARE_PROVIDER_SITE_OTHER): Payer: Medicare Other | Admitting: *Deleted

## 2015-12-03 DIAGNOSIS — Z7901 Long term (current) use of anticoagulants: Secondary | ICD-10-CM

## 2015-12-03 DIAGNOSIS — I743 Embolism and thrombosis of arteries of the lower extremities: Secondary | ICD-10-CM

## 2015-12-03 DIAGNOSIS — I4891 Unspecified atrial fibrillation: Secondary | ICD-10-CM

## 2015-12-03 DIAGNOSIS — Z5181 Encounter for therapeutic drug level monitoring: Secondary | ICD-10-CM | POA: Diagnosis not present

## 2015-12-03 DIAGNOSIS — I749 Embolism and thrombosis of unspecified artery: Secondary | ICD-10-CM

## 2015-12-03 LAB — POCT INR: INR: 2.1

## 2016-01-21 ENCOUNTER — Ambulatory Visit (INDEPENDENT_AMBULATORY_CARE_PROVIDER_SITE_OTHER): Payer: Medicare Other | Admitting: *Deleted

## 2016-01-21 DIAGNOSIS — I4891 Unspecified atrial fibrillation: Secondary | ICD-10-CM | POA: Diagnosis not present

## 2016-01-21 DIAGNOSIS — Z5181 Encounter for therapeutic drug level monitoring: Secondary | ICD-10-CM

## 2016-01-21 DIAGNOSIS — I743 Embolism and thrombosis of arteries of the lower extremities: Secondary | ICD-10-CM

## 2016-01-21 DIAGNOSIS — Z7901 Long term (current) use of anticoagulants: Secondary | ICD-10-CM | POA: Diagnosis not present

## 2016-01-21 DIAGNOSIS — I749 Embolism and thrombosis of unspecified artery: Secondary | ICD-10-CM

## 2016-01-21 LAB — POCT INR: INR: 2

## 2016-02-27 ENCOUNTER — Ambulatory Visit: Payer: Medicare Other | Admitting: Family Medicine

## 2016-03-03 ENCOUNTER — Encounter: Payer: Self-pay | Admitting: *Deleted

## 2016-03-13 ENCOUNTER — Other Ambulatory Visit: Payer: Self-pay | Admitting: Family Medicine

## 2016-03-15 NOTE — Telephone Encounter (Signed)
Ok to refill??      LOV & LRF 09/05/2015

## 2016-03-15 NOTE — Telephone Encounter (Signed)
okay

## 2016-03-15 NOTE — Telephone Encounter (Signed)
Medication called to pharmacy. 

## 2016-03-19 ENCOUNTER — Encounter: Payer: Self-pay | Admitting: Family Medicine

## 2016-03-19 ENCOUNTER — Ambulatory Visit (INDEPENDENT_AMBULATORY_CARE_PROVIDER_SITE_OTHER): Payer: Medicare Other | Admitting: Family Medicine

## 2016-03-19 ENCOUNTER — Ambulatory Visit (INDEPENDENT_AMBULATORY_CARE_PROVIDER_SITE_OTHER): Payer: Medicare Other | Admitting: *Deleted

## 2016-03-19 VITALS — BP 142/86 | HR 76 | Temp 98.1°F | Resp 16 | Ht 64.0 in | Wt 149.0 lb

## 2016-03-19 DIAGNOSIS — Z23 Encounter for immunization: Secondary | ICD-10-CM

## 2016-03-19 DIAGNOSIS — E7800 Pure hypercholesterolemia, unspecified: Secondary | ICD-10-CM | POA: Diagnosis not present

## 2016-03-19 DIAGNOSIS — F02818 Dementia in other diseases classified elsewhere, unspecified severity, with other behavioral disturbance: Secondary | ICD-10-CM

## 2016-03-19 DIAGNOSIS — N183 Chronic kidney disease, stage 3 unspecified: Secondary | ICD-10-CM

## 2016-03-19 DIAGNOSIS — I743 Embolism and thrombosis of arteries of the lower extremities: Secondary | ICD-10-CM

## 2016-03-19 DIAGNOSIS — G301 Alzheimer's disease with late onset: Secondary | ICD-10-CM | POA: Diagnosis not present

## 2016-03-19 DIAGNOSIS — F0281 Dementia in other diseases classified elsewhere with behavioral disturbance: Secondary | ICD-10-CM

## 2016-03-19 DIAGNOSIS — I10 Essential (primary) hypertension: Secondary | ICD-10-CM | POA: Diagnosis not present

## 2016-03-19 DIAGNOSIS — F5104 Psychophysiologic insomnia: Secondary | ICD-10-CM

## 2016-03-19 LAB — LIPID PANEL
Cholesterol: 152 mg/dL (ref <200)
HDL: 54 mg/dL (ref >50)
LDL CALC: 82 mg/dL (ref <100)
TRIGLYCERIDES: 82 mg/dL (ref <150)
Total CHOL/HDL Ratio: 2.8 Ratio (ref <5.0)
VLDL: 16 mg/dL (ref <30)

## 2016-03-19 LAB — CBC WITH DIFFERENTIAL/PLATELET
BASOS ABS: 0 {cells}/uL (ref 0–200)
Basophils Relative: 0 %
EOS ABS: 1980 {cells}/uL — AB (ref 15–500)
Eosinophils Relative: 22 %
HEMATOCRIT: 35.2 % (ref 35.0–45.0)
HEMOGLOBIN: 11.7 g/dL — AB (ref 12.0–15.0)
LYMPHS ABS: 3240 {cells}/uL (ref 850–3900)
Lymphocytes Relative: 36 %
MCH: 31.8 pg (ref 27.0–33.0)
MCHC: 33.2 g/dL (ref 32.0–36.0)
MCV: 95.7 fL (ref 80.0–100.0)
MONO ABS: 630 {cells}/uL (ref 200–950)
MPV: 10.7 fL (ref 7.5–12.5)
Monocytes Relative: 7 %
Neutro Abs: 3150 cells/uL (ref 1500–7800)
Neutrophils Relative %: 35 %
Platelets: 275 10*3/uL (ref 140–400)
RBC: 3.68 MIL/uL — AB (ref 3.80–5.10)
RDW: 13.9 % (ref 11.0–15.0)
WBC: 9 10*3/uL (ref 3.8–10.8)

## 2016-03-19 LAB — COMPREHENSIVE METABOLIC PANEL
ALBUMIN: 3.6 g/dL (ref 3.6–5.1)
ALT: 6 U/L (ref 6–29)
AST: 15 U/L (ref 10–35)
Alkaline Phosphatase: 72 U/L (ref 33–130)
BILIRUBIN TOTAL: 0.4 mg/dL (ref 0.2–1.2)
BUN: 23 mg/dL (ref 7–25)
CHLORIDE: 108 mmol/L (ref 98–110)
CO2: 22 mmol/L (ref 20–31)
CREATININE: 1.44 mg/dL — AB (ref 0.60–0.88)
Calcium: 9.3 mg/dL (ref 8.6–10.4)
GLUCOSE: 86 mg/dL (ref 70–99)
Potassium: 3.6 mmol/L (ref 3.5–5.3)
SODIUM: 140 mmol/L (ref 135–146)
Total Protein: 6.9 g/dL (ref 6.1–8.1)

## 2016-03-19 MED ORDER — AMLODIPINE BESYLATE 10 MG PO TABS: 10.0000 mg | ORAL_TABLET | Freq: Every day | ORAL | 3 refills | Status: DC

## 2016-03-19 MED ORDER — DONEPEZIL HCL 10 MG PO TABS: 10.0000 mg | ORAL_TABLET | Freq: Every day | ORAL | 3 refills | Status: DC

## 2016-03-19 MED ORDER — METOPROLOL TARTRATE 25 MG PO TABS: 25.0000 mg | ORAL_TABLET | Freq: Two times a day (BID) | ORAL | 3 refills | Status: DC

## 2016-03-19 NOTE — Progress Notes (Signed)
   Subjective:    Patient ID: [name], female    DOB: [birth year], 82 y.o.   MRN: [id number]  Patient presents for Follow-up (is fasting) Patient here to follow-up chronic medical problems. She has  Dementia , they ran out of the Aricept recently. She takes Seroquel at bedtime also uses lorazepam for anxiety and insomnia. This combination she has done much better with with regards to aggressive behavior and wandering at night time  She is followed by cardiology for her atrial fibrillation she is on chronic anticoagulation, she is taking her antihypertensives as prescribed her daughter gives her her medication.  Medications reviewed. She's not had any falls recently. She has a good appetite. No constipation she wears depends for urinary incontinence     Review Of Systems: Unable to obtain complete from pt - dementia   GEN- denies fatigue, fever, weight loss,weakness, recent illness HEENT- denies eye drainage, change in vision, nasal discharge, CVS- denies chest pain, palpitations RESP- denies SOB, cough, wheeze ABD- denies N/V, change in stools, abd pain GU- denies dysuria, hematuria, dribbling, incontinence MSK- denies joint pain, muscle aches, injury Neuro- denies headache, dizziness, syncope, seizure activity       Objective:    BP (!) 142/86 (BP Location: Left Arm, Patient Position: Sitting, Cuff Size: Large)   Pulse 76   Temp 98.1 F (36.7 C) (Oral)   Resp 16   Ht 5\' 4"  (1.626 m)   Wt 149 lb (67.6 kg)   SpO2 98%   BMI 25.58 kg/m  GEN- NAD, alert and orientedx2, pleasantly demented  HEENT- PERRL, EOMI, non injected sclera, pink conjunctiva, MMM, oropharynx clear Neck- Supple, no thyromegaly,  CVS- irregular rhythem, normal rate no murmur RESP-CTAB ABD-NABS,soft,NT,ND EXT- 1+ non pitting edema bilat - compression hose  Pulses- Radial, DP- 1+       Assessment & Plan:      Problem List Items Addressed This Visit    Hypertension - Primary   Relevant  Medications   metoprolol tartrate (LOPRESSOR) 25 MG tablet   amLODipine (NORVASC) 10 MG tablet   Other Relevant Orders   CBC with Differential/Platelet   Comprehensive metabolic panel   Hyperlipidemia   Relevant Medications   metoprolol tartrate (LOPRESSOR) 25 MG tablet   amLODipine (NORVASC) 10 MG tablet   Other Relevant Orders   Lipid panel   Dementia   Relevant Medications   donepezil (ARICEPT) 10 MG tablet   Chronic kidney disease   Chronic insomnia    The setting of her dementia she will continue with her Aricept and Seroquel lorazepam she's not had any falls her children provide her medications. This has been the best combination to keep her calm and keep her behavioral problems in check. I am checking her labs today she's chronic kidney disease hypertension in setting of her A. fib. Blood pressure looks okay for her age and not to make any changes.  Her weight is also improved today her appetite is good. No acute concerns at home.  Flu shot given today         Note: This dictation was prepared with Dragon dictation along with smaller phrase technology. Any transcriptional errors that result from this process are unintentional.

## 2016-03-19 NOTE — Patient Instructions (Signed)
F/U 6 months  Flu shot given  We will call with lab results

## 2016-03-19 NOTE — Assessment & Plan Note (Signed)
The setting of her dementia she will continue with her Aricept and Seroquel lorazepam she's not had any falls her children provide her medications. This has been the best combination to keep her calm and keep her behavioral problems in check. I am checking her labs today she's chronic kidney disease hypertension in setting of her A. fib. Blood pressure looks okay for her age and not to make any changes.  Her weight is also improved today her appetite is good. No acute concerns at home.  Flu shot given today

## 2016-03-23 ENCOUNTER — Ambulatory Visit (INDEPENDENT_AMBULATORY_CARE_PROVIDER_SITE_OTHER): Payer: Medicare Other | Admitting: Cardiovascular Disease

## 2016-03-23 ENCOUNTER — Ambulatory Visit (INDEPENDENT_AMBULATORY_CARE_PROVIDER_SITE_OTHER): Payer: Medicare Other | Admitting: *Deleted

## 2016-03-23 ENCOUNTER — Encounter: Payer: Self-pay | Admitting: Cardiovascular Disease

## 2016-03-23 VITALS — BP 140/80 | HR 69 | Ht 60.0 in | Wt 152.0 lb

## 2016-03-23 DIAGNOSIS — I10 Essential (primary) hypertension: Secondary | ICD-10-CM

## 2016-03-23 DIAGNOSIS — I739 Peripheral vascular disease, unspecified: Secondary | ICD-10-CM

## 2016-03-23 DIAGNOSIS — E7800 Pure hypercholesterolemia, unspecified: Secondary | ICD-10-CM | POA: Diagnosis not present

## 2016-03-23 DIAGNOSIS — I482 Chronic atrial fibrillation, unspecified: Secondary | ICD-10-CM

## 2016-03-23 DIAGNOSIS — I749 Embolism and thrombosis of unspecified artery: Secondary | ICD-10-CM

## 2016-03-23 DIAGNOSIS — I4891 Unspecified atrial fibrillation: Secondary | ICD-10-CM | POA: Diagnosis not present

## 2016-03-23 DIAGNOSIS — I743 Embolism and thrombosis of arteries of the lower extremities: Secondary | ICD-10-CM | POA: Diagnosis not present

## 2016-03-23 DIAGNOSIS — Z7901 Long term (current) use of anticoagulants: Secondary | ICD-10-CM

## 2016-03-23 DIAGNOSIS — Z5181 Encounter for therapeutic drug level monitoring: Secondary | ICD-10-CM

## 2016-03-23 LAB — POCT INR: INR: 1.5

## 2016-03-23 NOTE — Patient Instructions (Signed)
Your physician wants you to follow-up in: 1 year Dr Koneswaran You will receive a reminder letter in the mail two months in advance. If you don't receive a letter, please call our office to schedule the follow-up appointment.     Your physician recommends that you continue on your current medications as directed. Please refer to the Current Medication list given to you today.    If you need a refill on your cardiac medications before your next appointment, please call your pharmacy.      Thank you for choosing [facility] !         

## 2016-03-23 NOTE — Progress Notes (Signed)
SUBJECTIVE: The patient is an 82 year old woman who presents for follow-up of chronic atrial fibrillation. She also has hypertension, hyperlipidemia, peripheral vascular disease, and dementia.  Lipids 03/19/16 total cholesterol 152, temperature glycerides 82, HDL 54, LDL 82.  ECG performed in the office today demonstrates atrial fibrillation, heart rate 60 bpm, with a diffuse T-wave abnormality.  The patient denies any symptoms of chest pain, palpitations, shortness of breath, lightheadedness, dizziness, and syncope.    Review of Systems: As per "subjective", otherwise negative.  No Known Allergies  Current Outpatient Prescriptions  Medication Sig Dispense Refill  . amLODipine (NORVASC) 10 MG tablet Take 1 tablet (10 mg total) by mouth daily. 90 tablet 3  . aspirin 81 MG tablet Take 81 mg by mouth daily.    Marland Kitchen. donepezil (ARICEPT) 10 MG tablet Take 1 tablet (10 mg total) by mouth daily with breakfast. 90 tablet 3  . furosemide (LASIX) 40 MG tablet Take 1 tablet (40 mg total) by mouth daily. 90 tablet 3  . LORazepam (ATIVAN) 1 MG tablet TAKE ONE TABLET BY MOUTH ONCE DAILY AT BEDTIME AS NEEDED FOR ANXIETY OR  AGGITATION 90 tablet 1  . metoprolol tartrate (LOPRESSOR) 25 MG tablet Take 1 tablet (25 mg total) by mouth 2 (two) times daily. 180 tablet 3  . pravastatin (PRAVACHOL) 10 MG tablet Take 1 tablet (10 mg total) by mouth daily. 90 tablet 3  . QUEtiapine (SEROQUEL) 50 MG tablet Take 1 tablet (50 mg total) by mouth daily with breakfast. 90 tablet 3  . ranitidine (ZANTAC) 150 MG tablet Take 1 tablet (150 mg total) by mouth 2 (two) times daily. 180 tablet 3  . warfarin (COUMADIN) 2.5 MG tablet Take 1 tablet (2.5 mg total) by mouth as directed. 30 tablet 2  . warfarin (COUMADIN) 4 MG tablet Take 1 tablet (4 mg total) by mouth daily. 60 tablet 3   No current facility-administered medications for this visit.     Past Medical History:  Diagnosis Date  . Anemia    Hemoglobin of 11.6  in 01/2010; 12.1 in 08/2010  . Atrial fibrillation (HCC)    chronic requiring anticoagulation  . Chronic anticoagulation   . Chronic kidney disease    Stage III; creatinine of 1.7 in 04/2007 and 1.5 2009; 1.7 in 04/2010  . Hyperlipidemia    Total cholesterol of 226, triglycerides of 90, HDL of 49 and LDL of 159 in 01/2010  . Hypertension     Past Surgical History:  Procedure Laterality Date  . APPENDECTOMY    . BUNIONECTOMY  1995  . PARTIAL HYSTERECTOMY      Social History   Social History  . Marital status: Single    Spouse name: N/A  . Number of children: N/A  . Years of education: N/A   Occupational History  . Retired     LawyerCNA at a local SNF   Social History Main Topics  . Smoking status: Never Smoker  . Smokeless tobacco: Never Used  . Alcohol use No  . Drug use: No  . Sexual activity: No   Other Topics Concern  . Not on file   Social History Narrative  . No narrative on file     Vitals:   03/23/16 1020  BP: 140/80  Pulse: 69  SpO2: 98%  Weight: 152 lb (68.9 kg)  Height: 5' (1.524 m)    PHYSICAL EXAM General: NAD HEENT: Normal. Neck: No JVD, no thyromegaly. Lungs: Clear to auscultation bilaterally with normal  respiratory effort. CV: Normal rate, irregular rhythm, normal S1/S2, no S3, no murmur. No pretibial or periankle edema.  Abdomen: Soft, nontender, no distention.  Neurologic: Alert and oriented.  Psych: Normal affect.   ECG: Most recent ECG reviewed.    ASSESSMENT AND PLAN: 1. Chronic atrial fibrillation: HR controlled. Continue metoprolol 25 mg bid for HR control and warfarin for anticoagulation.   2. Essential HTN: Reasonably controlled for age. No changes.  3. PVD: No claudication. Continue ASA and statin therapy.  4. Hyperlipidemia: Most recent results noted above. Continue pravastatin 10 mg.  Dispo: f/u 1 year.   Prentice DockerSuresh Martavion Couper, M.D., F.A.C.C.

## 2016-03-24 ENCOUNTER — Encounter: Payer: Self-pay | Admitting: *Deleted

## 2016-04-12 ENCOUNTER — Encounter: Payer: Self-pay | Admitting: *Deleted

## 2016-04-20 ENCOUNTER — Ambulatory Visit: Payer: Medicare Other | Admitting: Family Medicine

## 2016-04-28 ENCOUNTER — Ambulatory Visit (INDEPENDENT_AMBULATORY_CARE_PROVIDER_SITE_OTHER): Payer: Medicare Other | Admitting: Family Medicine

## 2016-04-28 ENCOUNTER — Encounter: Payer: Self-pay | Admitting: Family Medicine

## 2016-04-28 VITALS — BP 140/76 | HR 88 | Temp 97.4°F

## 2016-04-28 DIAGNOSIS — I743 Embolism and thrombosis of arteries of the lower extremities: Secondary | ICD-10-CM

## 2016-04-28 DIAGNOSIS — G301 Alzheimer's disease with late onset: Secondary | ICD-10-CM | POA: Diagnosis not present

## 2016-04-28 DIAGNOSIS — R2681 Unsteadiness on feet: Secondary | ICD-10-CM | POA: Diagnosis not present

## 2016-04-28 DIAGNOSIS — R531 Weakness: Secondary | ICD-10-CM

## 2016-04-28 DIAGNOSIS — F0281 Dementia in other diseases classified elsewhere with behavioral disturbance: Secondary | ICD-10-CM | POA: Diagnosis not present

## 2016-04-28 NOTE — Patient Instructions (Addendum)
Physical therapy to be scheduled  F/U as previous

## 2016-04-28 NOTE — Assessment & Plan Note (Signed)
I think in the setting of her dementia she now believes that she cannot get up and move around. When directed she is able to take steps I think she would do fine with her walker if she did not have this fear that came out of nowhere. This is difficult especially have demented patients. She is still quite pleasant however. I think that she would benefit from some physical therapy had them come to the home and try to get her up to help with her confidence they can also let me know there is any other safety concerns. Discussed this with her daughter who is her primary caregiver and she agrees with the plan. I'm not change any of her medication she's not had any falls or any injuries there is no sign of any acute illness

## 2016-04-28 NOTE — Progress Notes (Signed)
Subjective:    Patient ID: [name], female    DOB: 1928/08/25, 82 y.o.   MRN: [id number]  Patient presents for Fatigue (x3 weeks, nervousness, scared to walk) Patient here with her daughter. For the past few weeks she states she has felt weak and she does not want to walk. Her daughter states that she seems like she is afraid to walk she thinks that she has been a fall and has been saying this. She's not had any falls recently is having any difficulties since her last visit in November there's been no recent change in her medications. She is unable to use her cane properly she does have a walker but just declines getting up. They have been putting her in a wheelchair recently. She denies any pain anywhere. She has not had any illness or fever    Review Of Systems:  Unable to obtain   GEN- denies fatigue, fever, weight loss,weakness, recent illness HEENT- denies eye drainage, change in vision, nasal discharge, CVS- denies chest pain, palpitations RESP- denies SOB, cough, wheeze ABD- denies N/V, change in stools, abd pain GU- denies dysuria, hematuria, dribbling, incontinence MSK- denies joint pain, muscle aches, injury Neuro- denies headache, dizziness, syncope, seizure activity       Objective:    BP 140/76   Pulse 88   Temp 97.4 F (36.3 C) (Oral)   SpO2 98%  GEN- NAD, alert and orientedx2, pleasantly demented ,sitting in wheelchair  HEENT- PERRL, EOMI, non injected sclera, pink conjunctiva, MMM, oropharynx clear Neck- Supple, no thyromegaly,  CVS- irregular rhythem, normal rate no murmur RESP-CTAB ABD-NABS,soft,NT,ND EXT- pedal  non pitting edema bilat - compression hose  MSK- Decreased ROM upper and LE, strength 4+/5 bilat, able to stand, pushes off chair, unsteady gait, keeps bending over and then states "i'm going to fall" when re-directed stands straighter and walks with some assistance which is baseline  Neuro- sensation grossly in tact LE, decreased generalized  muscle tone ,reflexes equal bilat  Pulses- Radial, DP- 1+       Assessment & Plan:      Problem List Items Addressed This Visit    Weakness    I think in the setting of her dementia she now believes that she cannot get up and move around. When directed she is able to take steps I think she would do fine with her walker if she did not have this fear that came out of nowhere. This is difficult especially have demented patients. She is still quite pleasant however. I think that she would benefit from some physical therapy had them come to the home and try to get her up to help with her confidence they can also let me know there is any other safety concerns. Discussed this with her daughter who is her primary caregiver and she agrees with the plan. I'm not change any of her medication she's not had any falls or any injuries there is no sign of any acute illness      Relevant Orders   Ambulatory referral to Home Health   Dementia - Primary   Relevant Orders   Ambulatory referral to Home Health    Other Visit Diagnoses    Gait instability       Relevant Orders   Ambulatory referral to Home Health      Note: This dictation was prepared with Dragon dictation along with smaller phrase technology. Any transcriptional errors that result from this process are unintentional.

## 2016-05-04 ENCOUNTER — Encounter: Payer: Self-pay | Admitting: Family Medicine

## 2016-05-06 DIAGNOSIS — N189 Chronic kidney disease, unspecified: Secondary | ICD-10-CM | POA: Diagnosis not present

## 2016-05-06 DIAGNOSIS — G301 Alzheimer's disease with late onset: Secondary | ICD-10-CM | POA: Diagnosis not present

## 2016-05-06 DIAGNOSIS — I129 Hypertensive chronic kidney disease with stage 1 through stage 4 chronic kidney disease, or unspecified chronic kidney disease: Secondary | ICD-10-CM | POA: Diagnosis not present

## 2016-05-06 DIAGNOSIS — R2681 Unsteadiness on feet: Secondary | ICD-10-CM | POA: Diagnosis not present

## 2016-05-06 DIAGNOSIS — F0281 Dementia in other diseases classified elsewhere with behavioral disturbance: Secondary | ICD-10-CM | POA: Diagnosis not present

## 2016-05-06 DIAGNOSIS — I739 Peripheral vascular disease, unspecified: Secondary | ICD-10-CM | POA: Diagnosis not present

## 2016-05-11 DIAGNOSIS — G301 Alzheimer's disease with late onset: Secondary | ICD-10-CM | POA: Diagnosis not present

## 2016-05-11 DIAGNOSIS — R2681 Unsteadiness on feet: Secondary | ICD-10-CM | POA: Diagnosis not present

## 2016-05-11 DIAGNOSIS — F0281 Dementia in other diseases classified elsewhere with behavioral disturbance: Secondary | ICD-10-CM | POA: Diagnosis not present

## 2016-05-11 DIAGNOSIS — N189 Chronic kidney disease, unspecified: Secondary | ICD-10-CM | POA: Diagnosis not present

## 2016-05-11 DIAGNOSIS — I739 Peripheral vascular disease, unspecified: Secondary | ICD-10-CM | POA: Diagnosis not present

## 2016-05-11 DIAGNOSIS — I129 Hypertensive chronic kidney disease with stage 1 through stage 4 chronic kidney disease, or unspecified chronic kidney disease: Secondary | ICD-10-CM | POA: Diagnosis not present

## 2016-05-13 DIAGNOSIS — I739 Peripheral vascular disease, unspecified: Secondary | ICD-10-CM | POA: Diagnosis not present

## 2016-05-13 DIAGNOSIS — N189 Chronic kidney disease, unspecified: Secondary | ICD-10-CM | POA: Diagnosis not present

## 2016-05-13 DIAGNOSIS — F0281 Dementia in other diseases classified elsewhere with behavioral disturbance: Secondary | ICD-10-CM | POA: Diagnosis not present

## 2016-05-13 DIAGNOSIS — G301 Alzheimer's disease with late onset: Secondary | ICD-10-CM | POA: Diagnosis not present

## 2016-05-13 DIAGNOSIS — R2681 Unsteadiness on feet: Secondary | ICD-10-CM | POA: Diagnosis not present

## 2016-05-13 DIAGNOSIS — I129 Hypertensive chronic kidney disease with stage 1 through stage 4 chronic kidney disease, or unspecified chronic kidney disease: Secondary | ICD-10-CM | POA: Diagnosis not present

## 2016-05-18 DIAGNOSIS — I739 Peripheral vascular disease, unspecified: Secondary | ICD-10-CM | POA: Diagnosis not present

## 2016-05-18 DIAGNOSIS — I129 Hypertensive chronic kidney disease with stage 1 through stage 4 chronic kidney disease, or unspecified chronic kidney disease: Secondary | ICD-10-CM | POA: Diagnosis not present

## 2016-05-18 DIAGNOSIS — G301 Alzheimer's disease with late onset: Secondary | ICD-10-CM | POA: Diagnosis not present

## 2016-05-18 DIAGNOSIS — N189 Chronic kidney disease, unspecified: Secondary | ICD-10-CM | POA: Diagnosis not present

## 2016-05-18 DIAGNOSIS — R2681 Unsteadiness on feet: Secondary | ICD-10-CM | POA: Diagnosis not present

## 2016-05-18 DIAGNOSIS — F0281 Dementia in other diseases classified elsewhere with behavioral disturbance: Secondary | ICD-10-CM | POA: Diagnosis not present

## 2016-05-21 ENCOUNTER — Ambulatory Visit (INDEPENDENT_AMBULATORY_CARE_PROVIDER_SITE_OTHER): Payer: Medicare Other | Admitting: *Deleted

## 2016-05-21 DIAGNOSIS — Z5181 Encounter for therapeutic drug level monitoring: Secondary | ICD-10-CM | POA: Diagnosis not present

## 2016-05-21 DIAGNOSIS — I4891 Unspecified atrial fibrillation: Secondary | ICD-10-CM | POA: Diagnosis not present

## 2016-05-21 DIAGNOSIS — I743 Embolism and thrombosis of arteries of the lower extremities: Secondary | ICD-10-CM

## 2016-05-21 DIAGNOSIS — I749 Embolism and thrombosis of unspecified artery: Secondary | ICD-10-CM

## 2016-05-21 LAB — POCT INR: INR: 3.3

## 2016-05-25 DIAGNOSIS — G301 Alzheimer's disease with late onset: Secondary | ICD-10-CM | POA: Diagnosis not present

## 2016-05-25 DIAGNOSIS — I739 Peripheral vascular disease, unspecified: Secondary | ICD-10-CM | POA: Diagnosis not present

## 2016-05-25 DIAGNOSIS — R2681 Unsteadiness on feet: Secondary | ICD-10-CM | POA: Diagnosis not present

## 2016-05-25 DIAGNOSIS — F0281 Dementia in other diseases classified elsewhere with behavioral disturbance: Secondary | ICD-10-CM | POA: Diagnosis not present

## 2016-05-25 DIAGNOSIS — I129 Hypertensive chronic kidney disease with stage 1 through stage 4 chronic kidney disease, or unspecified chronic kidney disease: Secondary | ICD-10-CM | POA: Diagnosis not present

## 2016-05-25 DIAGNOSIS — N189 Chronic kidney disease, unspecified: Secondary | ICD-10-CM | POA: Diagnosis not present

## 2016-05-26 ENCOUNTER — Ambulatory Visit: Payer: Medicare Other | Admitting: Family Medicine

## 2016-05-27 DIAGNOSIS — R2681 Unsteadiness on feet: Secondary | ICD-10-CM | POA: Diagnosis not present

## 2016-05-27 DIAGNOSIS — I129 Hypertensive chronic kidney disease with stage 1 through stage 4 chronic kidney disease, or unspecified chronic kidney disease: Secondary | ICD-10-CM | POA: Diagnosis not present

## 2016-05-27 DIAGNOSIS — N189 Chronic kidney disease, unspecified: Secondary | ICD-10-CM | POA: Diagnosis not present

## 2016-05-27 DIAGNOSIS — I739 Peripheral vascular disease, unspecified: Secondary | ICD-10-CM | POA: Diagnosis not present

## 2016-05-27 DIAGNOSIS — F0281 Dementia in other diseases classified elsewhere with behavioral disturbance: Secondary | ICD-10-CM | POA: Diagnosis not present

## 2016-05-27 DIAGNOSIS — G301 Alzheimer's disease with late onset: Secondary | ICD-10-CM | POA: Diagnosis not present

## 2016-05-28 ENCOUNTER — Ambulatory Visit (INDEPENDENT_AMBULATORY_CARE_PROVIDER_SITE_OTHER): Payer: Medicare Other | Admitting: Family Medicine

## 2016-05-28 ENCOUNTER — Encounter: Payer: Self-pay | Admitting: Family Medicine

## 2016-05-28 VITALS — BP 138/84 | HR 82 | Temp 98.4°F | Resp 18

## 2016-05-28 DIAGNOSIS — I743 Embolism and thrombosis of arteries of the lower extremities: Secondary | ICD-10-CM

## 2016-05-28 DIAGNOSIS — R05 Cough: Secondary | ICD-10-CM | POA: Diagnosis not present

## 2016-05-28 DIAGNOSIS — J189 Pneumonia, unspecified organism: Secondary | ICD-10-CM

## 2016-05-28 LAB — INFLUENZA A AND B AG, IMMUNOASSAY
INFLUENZA B ANTIGEN: NOT DETECTED
Influenza A Antigen: NOT DETECTED

## 2016-05-28 MED ORDER — AMOXICILLIN-POT CLAVULANATE 875-125 MG PO TABS: 1.0000 | ORAL_TABLET | Freq: Two times a day (BID) | ORAL | 0 refills | Status: DC

## 2016-05-28 NOTE — Patient Instructions (Signed)
Take antibiotics Cough medicine F/U as needed

## 2016-05-28 NOTE — Progress Notes (Signed)
   Subjective:    Patient ID: [name], female    DOB: 09-29-1928, 83 y.o.   MRN: [id number]  Patient presents for Illness (x1 week- productive cough, congestion, weakness, upset stomach, diarrhea with some bright red blood noted)  Cough with congestion, used robitussin corcidan since Saturday, + sick contact with son and daughter who had similar symptoms. No fever but has been very weak ,he appetite is good, drinking well.  Friday had loose bowels had bright red blood in stools x 1 day, since then no blood in stools , urinating as normal (incontinent) No SOB She is walking with PT  No confusion beyond her baseline   Review Of Systems:  GEN- denies fatigue, fever, weight loss,+weakness, recent illness HEENT- denies eye drainage, change in vision, nasal discharge, CVS- denies chest pain, palpitations RESP- denies SOB, +cough, wheeze ABD- denies N/V, change in stools, abd pain GU- denies dysuria, hematuria, dribbling, incontinence MSK- denies joint pain, muscle aches, injury Neuro- denies headache, dizziness, syncope, seizure activity       Objective:    BP 138/84 (BP Location: Right Arm, Patient Position: Sitting, Cuff Size: Large)   Pulse 82   Temp 98.4 F (36.9 C) (Oral)   Resp 18   SpO2 98%  GEN- NAD, alert and oriented x2 sitting in wheelchair  HEENT- PERRL, EOMI, non injected sclera, pink conjunctiva, MMM, oropharynx clear , TM clear bilat no effusion wax in canals   No  maxillary sinus tenderness, + Nasal drainage  Neck- Supple, no thyromegaly,  CVS- irregular rhythem, normal rate no murmur RESP- upper airway congestion and rhonchi bilat > Right side, no wheeze, normal WOB, normal oxygen sat  ABD-NABS,soft,NT,ND EXT- No edema Pulses- Radial 2+   Flu neg      Assessment & Plan:      Problem List Items Addressed This Visit    None    Visit Diagnoses    Community acquired pneumonia, unspecified laterality    -  Primary   With her age concerned for CAP, 83  weakness, cough with production. No fever, but not always seen in elderlyl lung exam very junky. With her coumadin being 3.3 already and her being high fall risk will use augmentin BID , continue robitussin Discussed red flags with daughter    Relevant Medications   amoxicillin-clavulanate (AUGMENTIN) 875-125 MG tablet   Other Relevant Orders   Influenza A and B Ag, Immunoassay (Completed)      Note: This dictation was prepared with Dragon dictation along with smaller phrase technology. Any transcriptional errors that result from this process are unintentional.

## 2016-06-01 DIAGNOSIS — F0281 Dementia in other diseases classified elsewhere with behavioral disturbance: Secondary | ICD-10-CM | POA: Diagnosis not present

## 2016-06-01 DIAGNOSIS — I129 Hypertensive chronic kidney disease with stage 1 through stage 4 chronic kidney disease, or unspecified chronic kidney disease: Secondary | ICD-10-CM | POA: Diagnosis not present

## 2016-06-01 DIAGNOSIS — I739 Peripheral vascular disease, unspecified: Secondary | ICD-10-CM | POA: Diagnosis not present

## 2016-06-01 DIAGNOSIS — G301 Alzheimer's disease with late onset: Secondary | ICD-10-CM | POA: Diagnosis not present

## 2016-06-01 DIAGNOSIS — N189 Chronic kidney disease, unspecified: Secondary | ICD-10-CM | POA: Diagnosis not present

## 2016-06-01 DIAGNOSIS — R2681 Unsteadiness on feet: Secondary | ICD-10-CM | POA: Diagnosis not present

## 2016-06-03 DIAGNOSIS — I129 Hypertensive chronic kidney disease with stage 1 through stage 4 chronic kidney disease, or unspecified chronic kidney disease: Secondary | ICD-10-CM | POA: Diagnosis not present

## 2016-06-03 DIAGNOSIS — F0281 Dementia in other diseases classified elsewhere with behavioral disturbance: Secondary | ICD-10-CM | POA: Diagnosis not present

## 2016-06-03 DIAGNOSIS — G301 Alzheimer's disease with late onset: Secondary | ICD-10-CM | POA: Diagnosis not present

## 2016-06-03 DIAGNOSIS — R2681 Unsteadiness on feet: Secondary | ICD-10-CM | POA: Diagnosis not present

## 2016-06-03 DIAGNOSIS — I739 Peripheral vascular disease, unspecified: Secondary | ICD-10-CM | POA: Diagnosis not present

## 2016-06-03 DIAGNOSIS — N189 Chronic kidney disease, unspecified: Secondary | ICD-10-CM | POA: Diagnosis not present

## 2016-06-16 ENCOUNTER — Ambulatory Visit (INDEPENDENT_AMBULATORY_CARE_PROVIDER_SITE_OTHER): Payer: Medicare Other | Admitting: *Deleted

## 2016-06-16 DIAGNOSIS — Z5181 Encounter for therapeutic drug level monitoring: Secondary | ICD-10-CM | POA: Diagnosis not present

## 2016-06-16 DIAGNOSIS — I743 Embolism and thrombosis of arteries of the lower extremities: Secondary | ICD-10-CM

## 2016-06-16 DIAGNOSIS — I749 Embolism and thrombosis of unspecified artery: Secondary | ICD-10-CM

## 2016-06-16 LAB — POCT INR: INR: 2.8

## 2016-07-14 ENCOUNTER — Ambulatory Visit (INDEPENDENT_AMBULATORY_CARE_PROVIDER_SITE_OTHER): Payer: Medicare Other | Admitting: *Deleted

## 2016-07-14 DIAGNOSIS — I743 Embolism and thrombosis of arteries of the lower extremities: Secondary | ICD-10-CM

## 2016-07-14 DIAGNOSIS — Z5181 Encounter for therapeutic drug level monitoring: Secondary | ICD-10-CM

## 2016-07-14 DIAGNOSIS — I749 Embolism and thrombosis of unspecified artery: Secondary | ICD-10-CM

## 2016-07-14 LAB — POCT INR: INR: 5

## 2016-07-21 ENCOUNTER — Ambulatory Visit (INDEPENDENT_AMBULATORY_CARE_PROVIDER_SITE_OTHER): Payer: Medicare Other | Admitting: *Deleted

## 2016-07-21 DIAGNOSIS — I743 Embolism and thrombosis of arteries of the lower extremities: Secondary | ICD-10-CM

## 2016-07-21 DIAGNOSIS — I749 Embolism and thrombosis of unspecified artery: Secondary | ICD-10-CM

## 2016-07-21 DIAGNOSIS — Z5181 Encounter for therapeutic drug level monitoring: Secondary | ICD-10-CM

## 2016-07-21 LAB — POCT INR: INR: 2.7

## 2016-08-11 ENCOUNTER — Encounter: Payer: Self-pay | Admitting: *Deleted

## 2016-08-23 ENCOUNTER — Ambulatory Visit (INDEPENDENT_AMBULATORY_CARE_PROVIDER_SITE_OTHER): Payer: Medicare Other | Admitting: *Deleted

## 2016-08-23 DIAGNOSIS — I749 Embolism and thrombosis of unspecified artery: Secondary | ICD-10-CM | POA: Diagnosis not present

## 2016-08-23 DIAGNOSIS — Z5181 Encounter for therapeutic drug level monitoring: Secondary | ICD-10-CM | POA: Diagnosis not present

## 2016-08-23 LAB — POCT INR: INR: 1.6

## 2016-09-03 ENCOUNTER — Ambulatory Visit: Payer: Medicare Other | Admitting: Family Medicine

## 2016-09-06 ENCOUNTER — Ambulatory Visit: Payer: Medicare Other | Admitting: Family Medicine

## 2016-09-13 ENCOUNTER — Ambulatory Visit (INDEPENDENT_AMBULATORY_CARE_PROVIDER_SITE_OTHER): Payer: Medicare Other | Admitting: *Deleted

## 2016-09-13 DIAGNOSIS — I749 Embolism and thrombosis of unspecified artery: Secondary | ICD-10-CM | POA: Diagnosis not present

## 2016-09-13 DIAGNOSIS — Z5181 Encounter for therapeutic drug level monitoring: Secondary | ICD-10-CM

## 2016-09-13 LAB — POCT INR: INR: 1.6

## 2016-09-13 MED ORDER — WARFARIN SODIUM 2.5 MG PO TABS: ORAL_TABLET | ORAL | 3 refills | Status: DC

## 2016-09-13 MED ORDER — WARFARIN SODIUM 4 MG PO TABS: 4.0000 mg | ORAL_TABLET | Freq: Every day | ORAL | 3 refills | Status: DC

## 2016-09-17 ENCOUNTER — Encounter: Payer: Self-pay | Admitting: Family Medicine

## 2016-09-17 ENCOUNTER — Ambulatory Visit (INDEPENDENT_AMBULATORY_CARE_PROVIDER_SITE_OTHER): Payer: Medicare Other | Admitting: Family Medicine

## 2016-09-17 VITALS — BP 136/72 | HR 60 | Temp 97.9°F | Resp 18

## 2016-09-17 DIAGNOSIS — I10 Essential (primary) hypertension: Secondary | ICD-10-CM | POA: Diagnosis not present

## 2016-09-17 DIAGNOSIS — N183 Chronic kidney disease, stage 3 unspecified: Secondary | ICD-10-CM

## 2016-09-17 DIAGNOSIS — B029 Zoster without complications: Secondary | ICD-10-CM

## 2016-09-17 DIAGNOSIS — I749 Embolism and thrombosis of unspecified artery: Secondary | ICD-10-CM

## 2016-09-17 DIAGNOSIS — E7800 Pure hypercholesterolemia, unspecified: Secondary | ICD-10-CM

## 2016-09-17 DIAGNOSIS — F02818 Dementia in other diseases classified elsewhere, unspecified severity, with other behavioral disturbance: Secondary | ICD-10-CM

## 2016-09-17 DIAGNOSIS — F0281 Dementia in other diseases classified elsewhere with behavioral disturbance: Secondary | ICD-10-CM | POA: Diagnosis not present

## 2016-09-17 DIAGNOSIS — G301 Alzheimer's disease with late onset: Secondary | ICD-10-CM | POA: Diagnosis not present

## 2016-09-17 LAB — COMPREHENSIVE METABOLIC PANEL
ALBUMIN: 3.3 g/dL — AB (ref 3.6–5.1)
ALK PHOS: 75 U/L (ref 33–130)
ALT: 4 U/L — ABNORMAL LOW (ref 6–29)
AST: 13 U/L (ref 10–35)
BUN: 18 mg/dL (ref 7–25)
CALCIUM: 8.7 mg/dL (ref 8.6–10.4)
CO2: 20 mmol/L (ref 20–31)
Chloride: 107 mmol/L (ref 98–110)
Creat: 1.58 mg/dL — ABNORMAL HIGH (ref 0.60–0.88)
Glucose, Bld: 85 mg/dL (ref 70–99)
POTASSIUM: 3.5 mmol/L (ref 3.5–5.3)
Sodium: 140 mmol/L (ref 135–146)
TOTAL PROTEIN: 6.5 g/dL (ref 6.1–8.1)
Total Bilirubin: 0.3 mg/dL (ref 0.2–1.2)

## 2016-09-17 LAB — CBC WITH DIFFERENTIAL/PLATELET
BASOS PCT: 1 %
Basophils Absolute: 69 cells/uL (ref 0–200)
EOS ABS: 1794 {cells}/uL — AB (ref 15–500)
Eosinophils Relative: 26 %
HEMATOCRIT: 37.6 % (ref 35.0–45.0)
HEMOGLOBIN: 11.8 g/dL — AB (ref 12.0–15.0)
Lymphocytes Relative: 30 %
Lymphs Abs: 2070 cells/uL (ref 850–3900)
MCH: 29.4 pg (ref 27.0–33.0)
MCHC: 31.4 g/dL — ABNORMAL LOW (ref 32.0–36.0)
MCV: 93.8 fL (ref 80.0–100.0)
MONO ABS: 690 {cells}/uL (ref 200–950)
MPV: 10.2 fL (ref 7.5–12.5)
Monocytes Relative: 10 %
NEUTROS ABS: 2277 {cells}/uL (ref 1500–7800)
Neutrophils Relative %: 33 %
Platelets: 240 10*3/uL (ref 140–400)
RBC: 4.01 MIL/uL (ref 3.80–5.10)
RDW: 14.1 % (ref 11.0–15.0)
WBC: 6.9 10*3/uL (ref 3.8–10.8)

## 2016-09-17 LAB — LIPID PANEL
CHOL/HDL RATIO: 3.3 ratio (ref <5.0)
CHOLESTEROL: 173 mg/dL (ref <200)
HDL: 52 mg/dL (ref >50)
LDL Cholesterol: 101 mg/dL — ABNORMAL HIGH (ref <100)
Triglycerides: 101 mg/dL (ref <150)
VLDL: 20 mg/dL (ref <30)

## 2016-09-17 MED ORDER — VALACYCLOVIR HCL 1 G PO TABS: 1000.0000 mg | ORAL_TABLET | Freq: Three times a day (TID) | ORAL | 0 refills | Status: DC

## 2016-09-17 NOTE — Patient Instructions (Signed)
Shingles- take valtrex Continue all other medications F/U Nov Physical - schedule with daughter

## 2016-09-17 NOTE — Assessment & Plan Note (Signed)
Control no change in medication. 

## 2016-09-17 NOTE — Progress Notes (Signed)
   Subjective:    Patient ID: [name], female    DOB: 08-Dec-1928, 83 y.o.   MRN: [id number]  Patient presents for 6 month F/U (is fasting)    Daughter here, states her hearing is worse than usual As like her ears may need to be cleaned out     Alzheimers dementia- has some behavioral outburst and yelling, often regresses to when she was "working". Her behavior is no worse than any other time. Her sleep is doing okay right now her current medications. She's not had any recent falls Brayton CavesJessie does not like to walk with her walker to prefer to sit in a wheelchair. Daughter states she always thinks that she is going to fall      Hyperlipidemia- taking statin drug as prescribed        Chronic A fib- she still on Coumadin therapy she's taken her cardiac medications as prescribed by cardiology. Appetite is still good  Heart states that she noticed a small rash with a couple of bumps on her back a few days ago she's not complaining of anything.  Review Of Systems: UNABLE TO OBTAIN  GEN- denies fatigue, fever, weight loss,weakness, recent illness HEENT- denies eye drainage, change in vision, nasal discharge, CVS- denies chest pain, palpitations RESP- denies SOB, cough, wheeze ABD- denies N/V, change in stools, abd pain GU- denies dysuria, hematuria, dribbling, incontinence MSK- denies joint pain, muscle aches, injury Neuro- denies headache, dizziness, syncope, seizure activity       Objective:    BP 136/72   Pulse 60   Temp 97.9 F (36.6 C) (Oral)   Resp 18   SpO2 98%  GEN- NAD, alert and oriented x2 HEENT- PERRL, EOMI, non injected sclera, pink conjunctiva, MMM, oropharynx clear, Right canal impaction, left clear Neck- Supple,  CVS- irregular rhythem,normal rate , no murmur RESP-CTAB ABD-NABS,soft,NT,ND Skin- vesicular grouped erythematous leions from mid thoracic to right side along dermatome EXT- No edema Pulses- Radial 2+        Assessment & Plan:      Problem  List Items Addressed This Visit    Hypertension - Primary    Control no change in medication.      Relevant Orders   CBC with Differential/Platelet   Comprehensive metabolic panel   Hyperlipidemia    On statin, check lipids      Relevant Orders   Lipid panel   Dementia    Fairly stable at home with current regimen Cerumen removed at bedside from ears      Chronic kidney disease    Other Visit Diagnoses    Herpes zoster without complication       Treat with valtrex x 7 days, interestingly she is not complaining this may be more of her dementia   Relevant Medications   valACYclovir (VALTREX) 1000 MG tablet      Note: This dictation was prepared with Dragon dictation along with smaller phrase technology. Any transcriptional errors that result from this process are unintentional.

## 2016-09-17 NOTE — Assessment & Plan Note (Signed)
On statin ,check lipids 

## 2016-09-17 NOTE — Assessment & Plan Note (Signed)
Fairly stable at home with current regimen Cerumen removed at bedside from ears

## 2016-09-20 ENCOUNTER — Other Ambulatory Visit: Payer: Self-pay | Admitting: Family Medicine

## 2016-09-20 NOTE — Telephone Encounter (Signed)
okay

## 2016-09-20 NOTE — Telephone Encounter (Signed)
Medication called to pharmacy. 

## 2016-09-20 NOTE — Telephone Encounter (Signed)
Ok to refill??  Last office visit 09/17/2016.  Last refill 03/15/2016, #1 refills.

## 2016-09-27 ENCOUNTER — Encounter (HOSPITAL_COMMUNITY): Payer: Self-pay

## 2016-09-27 ENCOUNTER — Emergency Department (HOSPITAL_COMMUNITY)
Admission: EM | Admit: 2016-09-27 | Discharge: 2016-09-27 | Disposition: A | Discharge status: Home / Self Care | Payer: Medicare Other | Attending: Dermatology | Admitting: Dermatology

## 2016-09-27 DIAGNOSIS — T1490XA Injury, unspecified, initial encounter: Secondary | ICD-10-CM | POA: Diagnosis not present

## 2016-09-27 DIAGNOSIS — W06XXXA Fall from bed, initial encounter: Secondary | ICD-10-CM | POA: Insufficient documentation

## 2016-09-27 DIAGNOSIS — Y999 Unspecified external cause status: Secondary | ICD-10-CM | POA: Diagnosis not present

## 2016-09-27 DIAGNOSIS — Y939 Activity, unspecified: Secondary | ICD-10-CM | POA: Insufficient documentation

## 2016-09-27 DIAGNOSIS — Y929 Unspecified place or not applicable: Secondary | ICD-10-CM | POA: Insufficient documentation

## 2016-09-27 NOTE — ED Triage Notes (Addendum)
Daughter states patient slid of out bed Saturday and fell on left side on bedside table. Denies any other injuries.

## 2016-09-27 NOTE — ED Triage Notes (Signed)
Pt leaving with family member due to long wait times.  Stated earlier that she would see her PCP tomorrow.

## 2016-09-28 ENCOUNTER — Ambulatory Visit (HOSPITAL_COMMUNITY)
Admission: RE | Admit: 2016-09-28 | Discharge: 2016-09-28 | Disposition: A | Discharge status: Home / Self Care | Payer: Medicare Other | Source: Ambulatory Visit | Attending: Family Medicine | Admitting: Family Medicine

## 2016-09-28 ENCOUNTER — Telehealth: Payer: Self-pay | Admitting: *Deleted

## 2016-09-28 DIAGNOSIS — R0781 Pleurodynia: Secondary | ICD-10-CM

## 2016-09-28 DIAGNOSIS — S299XXA Unspecified injury of thorax, initial encounter: Secondary | ICD-10-CM | POA: Diagnosis not present

## 2016-09-28 NOTE — Telephone Encounter (Addendum)
Received VM from patient daughter, Steward DroneBrenda.   States that patient slid out of bed on Saturday, striking L side on bedside table. Reports that she was in ER for pain, but wait time was extremely long and patient left AMA.   Requested MD to order X-ray of ribs.   MD please advise.

## 2016-09-28 NOTE — Telephone Encounter (Signed)
Call placed to patient and patient daughter Steward DroneBrenda made aware.   X-Ray ordered. Appointment scheduled.

## 2016-09-28 NOTE — Telephone Encounter (Signed)
Okay to order xray of ribs Bring her in for visit as well

## 2016-09-29 ENCOUNTER — Encounter: Payer: Self-pay | Admitting: Family Medicine

## 2016-09-29 ENCOUNTER — Ambulatory Visit (INDEPENDENT_AMBULATORY_CARE_PROVIDER_SITE_OTHER): Payer: Medicare Other | Admitting: Family Medicine

## 2016-09-29 VITALS — BP 132/62 | HR 78 | Temp 98.6°F | Resp 18

## 2016-09-29 DIAGNOSIS — F02818 Dementia in other diseases classified elsewhere, unspecified severity, with other behavioral disturbance: Secondary | ICD-10-CM

## 2016-09-29 DIAGNOSIS — I749 Embolism and thrombosis of unspecified artery: Secondary | ICD-10-CM

## 2016-09-29 DIAGNOSIS — F0281 Dementia in other diseases classified elsewhere with behavioral disturbance: Secondary | ICD-10-CM

## 2016-09-29 DIAGNOSIS — W19XXXA Unspecified fall, initial encounter: Secondary | ICD-10-CM

## 2016-09-29 DIAGNOSIS — G301 Alzheimer's disease with late onset: Secondary | ICD-10-CM | POA: Diagnosis not present

## 2016-09-29 DIAGNOSIS — S40022A Contusion of left upper arm, initial encounter: Secondary | ICD-10-CM

## 2016-09-29 DIAGNOSIS — S20212A Contusion of left front wall of thorax, initial encounter: Secondary | ICD-10-CM

## 2016-09-29 MED ORDER — DONEPEZIL HCL 10 MG PO TABS
10.0000 mg | ORAL_TABLET | Freq: Every day | ORAL | 3 refills | Status: DC
Start: 2016-09-29 — End: 2018-02-22

## 2016-09-29 MED ORDER — PRAVASTATIN SODIUM 10 MG PO TABS: 10.0000 mg | ORAL_TABLET | Freq: Every day | ORAL | 3 refills | Status: DC

## 2016-09-29 NOTE — Patient Instructions (Addendum)
Give Tylenol 1-2 tablets  twice a day for pain F/U as previous

## 2016-09-29 NOTE — Progress Notes (Signed)
    Subjective:    Patient ID: [name], female    DOB: December 21, 1928, 83 y.o.   MRN: [id number]  Patient presents for Rib Pain (L sided pain- S/P fall) Patient here to daughter. She sustained a fall on Saturday she was trying to get out of bed by herself which sometimes she does do she slid off of the mattress but hit the night stand on her left side she's been complaining of pain in her ribs since then. She did get x-ray of her ribs yesterday with reordered as they did not say the emergency room due to the long wait. X-ray did not show any acute fracture. She also has a bruise on her left arm but has not been complaining of this. Of note her shingles is drying up she is completing the Valtrex.  Daughter has been giving her Tylenol which she does complain of pain she has not had anything this morning. With regards to her walk-in general often she will be quite stubborn and will not want to get up and she is fearful of falling. Other times she will get a with her walker without any difficulties.    Review Of Systems: per above DEMENTIA unable to get   GEN- denies fatigue, fever, weight loss,weakness, recent illness HEENT- denies eye drainage, change in vision, nasal discharge, CVS- denies chest pain, palpitations RESP- denies SOB, cough, wheeze ABD- denies N/V, change in stools, abd pain GU- denies dysuria, hematuria, dribbling, incontinence MSK- denies joint pain, muscle aches, injury Neuro- denies headache, dizziness, syncope, seizure activity       Objective:    BP 132/62   Pulse 78   Temp 98.6 F (37 C) (Oral)   Resp 18   SpO2 98%  GEN- NAD, alert and oriented x 2, sitting in wheelchair  HEENT- PERRL, EOMI, non injected sclera, pink conjunctiva, MMM, oropharynx clear Neck- fair ROM CVS- irregular rhythem, no murmur RESP-CTAB ABD-NABS,soft,NT,ND MSK- no pain with ROM of HIPS/KNEES, Stood only for about 1 minute, shuffled feet a few steps. No pain with ambulation, TTP over  left ribs in area of bruising EXT- No edema Pulses- Radial  2+ Skin- small laceration to ribs with bruising around ,bruising size of quarter right forarm, NT , shingles rash dry scabs on back      Assessment & Plan:      Problem List Items Addressed This Visit    Dementia    Her dementia makes it difficult for her caregivers, she has walker beside items if she does get up, but often will not use, they are checking on her regulary.      Relevant Medications   donepezil (ARICEPT) 10 MG tablet    Other Visit Diagnoses    Fall, initial encounter    -  Primary   S/P fall with bruising to ribs, xray no fracture, also bruise to arm but not painful, has ROM at baseline, Given tylenol BID for pain. expect bruises to resolve   Contusion of rib on left side, initial encounter       Arm bruise, left, initial encounter          Note: This dictation was prepared with Dragon dictation along with smaller phrase technology. Any transcriptional errors that result from this process are unintentional.

## 2016-09-29 NOTE — Assessment & Plan Note (Addendum)
Her dementia makes it difficult for her caregivers, she has walker beside items if she does get up, but often will not use, they are checking on her regulary.

## 2016-10-06 ENCOUNTER — Ambulatory Visit (INDEPENDENT_AMBULATORY_CARE_PROVIDER_SITE_OTHER): Payer: Medicare Other | Admitting: *Deleted

## 2016-10-06 ENCOUNTER — Other Ambulatory Visit (HOSPITAL_COMMUNITY)
Admission: RE | Admit: 2016-10-06 | Discharge: 2016-10-06 | Disposition: A | Discharge status: Home / Self Care | Payer: Medicare Other | Source: Ambulatory Visit | Attending: Cardiovascular Disease | Admitting: Cardiovascular Disease

## 2016-10-06 DIAGNOSIS — Z5181 Encounter for therapeutic drug level monitoring: Secondary | ICD-10-CM | POA: Insufficient documentation

## 2016-10-06 DIAGNOSIS — I749 Embolism and thrombosis of unspecified artery: Secondary | ICD-10-CM | POA: Insufficient documentation

## 2016-10-06 LAB — PROTIME-INR
INR: 6.79
Prothrombin Time: 61.1 s — ABNORMAL HIGH (ref 11.4–15.2)

## 2016-10-06 LAB — POCT INR: INR: >8

## 2016-10-07 ENCOUNTER — Other Ambulatory Visit: Payer: Self-pay | Admitting: Family Medicine

## 2016-10-11 ENCOUNTER — Ambulatory Visit (INDEPENDENT_AMBULATORY_CARE_PROVIDER_SITE_OTHER): Payer: Medicare Other | Admitting: *Deleted

## 2016-10-11 DIAGNOSIS — Z5181 Encounter for therapeutic drug level monitoring: Secondary | ICD-10-CM | POA: Diagnosis not present

## 2016-10-11 DIAGNOSIS — I749 Embolism and thrombosis of unspecified artery: Secondary | ICD-10-CM | POA: Diagnosis not present

## 2016-10-11 DIAGNOSIS — I4891 Unspecified atrial fibrillation: Secondary | ICD-10-CM

## 2016-10-11 LAB — POCT INR: INR: 1.5

## 2016-10-22 ENCOUNTER — Telehealth: Payer: Self-pay | Admitting: Family Medicine

## 2016-10-22 NOTE — Telephone Encounter (Signed)
Patients daughter calling to say her mother is itching really bad, was in for shingles, and has appt on Monday, for f/u but would like a call back regarding her itching  [phone]

## 2016-10-25 ENCOUNTER — Ambulatory Visit: Payer: Medicare Other | Admitting: Family Medicine

## 2016-10-25 NOTE — Telephone Encounter (Signed)
Patient has appointment today.   Will F/U then.

## 2016-10-26 ENCOUNTER — Ambulatory Visit (INDEPENDENT_AMBULATORY_CARE_PROVIDER_SITE_OTHER): Payer: Medicare Other | Admitting: Family Medicine

## 2016-10-26 ENCOUNTER — Encounter: Payer: Self-pay | Admitting: Family Medicine

## 2016-10-26 VITALS — BP 130/78 | HR 58 | Temp 97.8°F | Resp 18

## 2016-10-26 DIAGNOSIS — I749 Embolism and thrombosis of unspecified artery: Secondary | ICD-10-CM

## 2016-10-26 DIAGNOSIS — H9193 Unspecified hearing loss, bilateral: Secondary | ICD-10-CM | POA: Diagnosis not present

## 2016-10-26 DIAGNOSIS — B0229 Other postherpetic nervous system involvement: Secondary | ICD-10-CM | POA: Diagnosis not present

## 2016-10-26 MED ORDER — PRAVASTATIN SODIUM 10 MG PO TABS: 10.0000 mg | ORAL_TABLET | Freq: Every day | ORAL | 3 refills | Status: DC

## 2016-10-26 MED ORDER — QUETIAPINE FUMARATE 50 MG PO TABS: 50.0000 mg | ORAL_TABLET | Freq: Every day | ORAL | 3 refills | Status: DC

## 2016-10-26 MED ORDER — CLOTRIMAZOLE-BETAMETHASONE 1-0.05 % EX CREA: 1.0000 "application " | TOPICAL_CREAM | Freq: Two times a day (BID) | CUTANEOUS | 0 refills | Status: DC

## 2016-10-26 NOTE — Progress Notes (Signed)
   Subjective:    Patient ID: [name], female    DOB: 1929-03-27, 83 y.o.   MRN: [id number]  Patient presents for Follow-up (Singles) and Hearing Loss (R ear- pt daughter states that she can't hear and she has to get right next to ear to talk)  Patient here to all of shingles. Her daughter states that she is complaining of some itching on her back had a couple of new red spot she is not sure if it was just irritation from her depends which is asked to lower down from her previous shingles. She denies any pain in the area.  Her hearing is also worsening like to proceed with getting her hair and evaluation for possible hearing aids.   Review Of Systems: UNABLE TO OBTAIN FROM PT  GEN- denies fatigue, fever, weight loss,weakness, recent illness HEENT- denies eye drainage, change in vision, nasal discharge, CVS- denies chest pain, palpitations RESP- denies SOB, cough, wheeze ABD- denies N/V, change in stools, abd pain GU- denies dysuria, hematuria, dribbling, incontinence MSK- denies joint pain, muscle aches, injury Neuro- denies headache, dizziness, syncope, seizure activity       Objective:    BP 130/78   Pulse (!) 58   Temp 97.8 F (36.6 C) (Oral)   Resp 18   SpO2 98%  GEN- NAD, alert and oriented x 2 HEENT- PERRL, EOMI, non injected sclera, pink conjunctiva, MMM, oropharynx clear, mild wax deep  in canals ,decreased hearing  CVS- irregular rhythem, no murmur RESP-CTAB EXT- No edema Pulses- Radial  2+ Skin- previous shingles rash dark scarring, no scabs, no new lesions, area beneath pannus no erythema indentation near depends      Assessment & Plan:      Problem List Items Addressed This Visit    None    Visit Diagnoses    Decreased hearing of both ears    -  Primary   per family request send for formal hearing evaluation, not sure if she will wear the aides   Post herpetic neuralgia       Use topical steroid more itching instead of true nerve pain, keep skin  hydrated   Relevant Medications   QUEtiapine (SEROQUEL) 50 MG tablet      Note: This dictation was prepared with Dragon dictation along with smaller phrase technology. Any transcriptional errors that result from this process are unintentional.

## 2016-10-26 NOTE — Patient Instructions (Addendum)
F/U as previous  Trena PlattBrenda Perkins- [facility] Friday 6/29 at 9am Dr. Ardelle AntonWagoner [phone]

## 2016-10-27 ENCOUNTER — Encounter: Payer: Self-pay | Admitting: Family Medicine

## 2016-11-01 ENCOUNTER — Encounter: Payer: Self-pay | Admitting: *Deleted

## 2016-11-08 ENCOUNTER — Ambulatory Visit (INDEPENDENT_AMBULATORY_CARE_PROVIDER_SITE_OTHER): Payer: Medicare Other | Admitting: *Deleted

## 2016-11-08 DIAGNOSIS — Z5181 Encounter for therapeutic drug level monitoring: Secondary | ICD-10-CM | POA: Diagnosis not present

## 2016-11-08 DIAGNOSIS — I749 Embolism and thrombosis of unspecified artery: Secondary | ICD-10-CM | POA: Diagnosis not present

## 2016-11-08 LAB — POCT INR: INR: 2.9

## 2016-12-01 ENCOUNTER — Ambulatory Visit (INDEPENDENT_AMBULATORY_CARE_PROVIDER_SITE_OTHER): Payer: Medicare Other | Admitting: *Deleted

## 2016-12-01 DIAGNOSIS — Z5181 Encounter for therapeutic drug level monitoring: Secondary | ICD-10-CM

## 2016-12-01 DIAGNOSIS — I749 Embolism and thrombosis of unspecified artery: Secondary | ICD-10-CM

## 2016-12-01 LAB — POCT INR: INR: 2.2

## 2017-01-19 ENCOUNTER — Encounter: Payer: Self-pay | Admitting: Family Medicine

## 2017-01-19 ENCOUNTER — Ambulatory Visit (INDEPENDENT_AMBULATORY_CARE_PROVIDER_SITE_OTHER): Payer: Medicare Other | Admitting: Family Medicine

## 2017-01-19 ENCOUNTER — Emergency Department (HOSPITAL_COMMUNITY)
Admission: EM | Admit: 2017-01-19 | Discharge: 2017-01-20 | Disposition: A | Discharge status: Home / Self Care | Payer: Medicare Other | Attending: Emergency Medicine | Admitting: Emergency Medicine

## 2017-01-19 ENCOUNTER — Encounter (HOSPITAL_COMMUNITY): Payer: Self-pay | Admitting: *Deleted

## 2017-01-19 ENCOUNTER — Ambulatory Visit (INDEPENDENT_AMBULATORY_CARE_PROVIDER_SITE_OTHER): Payer: Medicare Other | Admitting: *Deleted

## 2017-01-19 VITALS — BP 138/78 | HR 67 | Temp 98.1°F | Resp 16

## 2017-01-19 DIAGNOSIS — Z7901 Long term (current) use of anticoagulants: Secondary | ICD-10-CM | POA: Insufficient documentation

## 2017-01-19 DIAGNOSIS — F02818 Dementia in other diseases classified elsewhere, unspecified severity, with other behavioral disturbance: Secondary | ICD-10-CM

## 2017-01-19 DIAGNOSIS — N390 Urinary tract infection, site not specified: Secondary | ICD-10-CM | POA: Diagnosis not present

## 2017-01-19 DIAGNOSIS — I129 Hypertensive chronic kidney disease with stage 1 through stage 4 chronic kidney disease, or unspecified chronic kidney disease: Secondary | ICD-10-CM | POA: Diagnosis not present

## 2017-01-19 DIAGNOSIS — K921 Melena: Secondary | ICD-10-CM

## 2017-01-19 DIAGNOSIS — I749 Embolism and thrombosis of unspecified artery: Secondary | ICD-10-CM | POA: Diagnosis not present

## 2017-01-19 DIAGNOSIS — Z79899 Other long term (current) drug therapy: Secondary | ICD-10-CM | POA: Diagnosis not present

## 2017-01-19 DIAGNOSIS — D649 Anemia, unspecified: Secondary | ICD-10-CM | POA: Diagnosis not present

## 2017-01-19 DIAGNOSIS — N183 Chronic kidney disease, stage 3 (moderate): Secondary | ICD-10-CM | POA: Diagnosis not present

## 2017-01-19 DIAGNOSIS — Z7982 Long term (current) use of aspirin: Secondary | ICD-10-CM | POA: Insufficient documentation

## 2017-01-19 DIAGNOSIS — R791 Abnormal coagulation profile: Secondary | ICD-10-CM | POA: Diagnosis not present

## 2017-01-19 DIAGNOSIS — R319 Hematuria, unspecified: Secondary | ICD-10-CM

## 2017-01-19 DIAGNOSIS — Z5181 Encounter for therapeutic drug level monitoring: Secondary | ICD-10-CM

## 2017-01-19 DIAGNOSIS — G301 Alzheimer's disease with late onset: Secondary | ICD-10-CM

## 2017-01-19 DIAGNOSIS — I4891 Unspecified atrial fibrillation: Secondary | ICD-10-CM | POA: Diagnosis not present

## 2017-01-19 DIAGNOSIS — E876 Hypokalemia: Secondary | ICD-10-CM | POA: Diagnosis not present

## 2017-01-19 DIAGNOSIS — R5383 Other fatigue: Secondary | ICD-10-CM | POA: Insufficient documentation

## 2017-01-19 DIAGNOSIS — R197 Diarrhea, unspecified: Secondary | ICD-10-CM | POA: Diagnosis not present

## 2017-01-19 DIAGNOSIS — F0281 Dementia in other diseases classified elsewhere with behavioral disturbance: Secondary | ICD-10-CM

## 2017-01-19 DIAGNOSIS — E875 Hyperkalemia: Secondary | ICD-10-CM | POA: Diagnosis not present

## 2017-01-19 DIAGNOSIS — R195 Other fecal abnormalities: Secondary | ICD-10-CM | POA: Diagnosis not present

## 2017-01-19 LAB — COMPREHENSIVE METABOLIC PANEL
ALT: 7 U/L — ABNORMAL LOW (ref 14–54)
ANION GAP: 11 (ref 5–15)
AST: 15 U/L (ref 15–41)
Albumin: 3.2 g/dL — ABNORMAL LOW (ref 3.5–5.0)
Alkaline Phosphatase: 71 U/L (ref 38–126)
BUN: 17 mg/dL (ref 6–20)
CHLORIDE: 107 mmol/L (ref 101–111)
CO2: 18 mmol/L — AB (ref 22–32)
Calcium: 8.9 mg/dL (ref 8.9–10.3)
Creatinine, Ser: 1.5 mg/dL — ABNORMAL HIGH (ref 0.44–1.00)
GFR, EST AFRICAN AMERICAN: 35 mL/min — AB (ref >60)
GFR, EST NON AFRICAN AMERICAN: 30 mL/min — AB (ref >60)
Glucose, Bld: 96 mg/dL (ref 65–99)
POTASSIUM: 2.9 mmol/L — AB (ref 3.5–5.1)
SODIUM: 136 mmol/L (ref 135–145)
Total Bilirubin: 0.4 mg/dL (ref 0.3–1.2)
Total Protein: 7.6 g/dL (ref 6.5–8.1)

## 2017-01-19 LAB — CBC
HCT: 31.5 % — ABNORMAL LOW (ref 35.0–45.0)
HCT: 30.9 % — ABNORMAL LOW (ref 36.0–46.0)
HEMOGLOBIN: 10.8 g/dL — AB (ref 11.7–15.5)
HEMOGLOBIN: 10.2 g/dL — AB (ref 12.0–15.0)
MCH: 32.3 pg (ref 27.0–33.0)
MCH: 30.4 pg (ref 26.0–34.0)
MCHC: 34.3 g/dL (ref 32.0–36.0)
MCHC: 33 g/dL (ref 30.0–36.0)
MCV: 94.3 fL (ref 80.0–100.0)
MCV: 92.2 fL (ref 78.0–100.0)
Platelets: 402 10*3/uL — ABNORMAL HIGH (ref 140–400)
Platelets: 413 10*3/uL — ABNORMAL HIGH (ref 150–400)
RBC: 3.34 10*6/uL — ABNORMAL LOW (ref 3.80–5.10)
RBC: 3.35 MIL/uL — AB (ref 3.87–5.11)
RDW: 13.6 % (ref 11.0–15.0)
RDW: 13.4 % (ref 11.5–15.5)
WBC: 10.4 10*3/uL (ref 3.8–10.8)
WBC: 10.3 10*3/uL (ref 4.0–10.5)

## 2017-01-19 LAB — URINALYSIS, ROUTINE W REFLEX MICROSCOPIC
Bilirubin Urine: NEGATIVE
Glucose, UA: NEGATIVE
KETONES UR: NEGATIVE
NITRITE: POSITIVE — AB
SPECIFIC GRAVITY, URINE: 1.01 (ref 1.001–1.03)
pH: 5.5 (ref 5.0–8.0)

## 2017-01-19 LAB — TYPE AND SCREEN
ABO/RH(D): B POS
Antibody Screen: NEGATIVE

## 2017-01-19 LAB — POCT INR: INR: 4.6

## 2017-01-19 LAB — PROTIME-INR
INR: 3.06
PROTHROMBIN TIME: 31.4 s — AB (ref 11.4–15.2)

## 2017-01-19 LAB — MICROSCOPIC MESSAGE

## 2017-01-19 MED ORDER — CEPHALEXIN 250 MG PO CAPS: 250.0000 mg | ORAL_CAPSULE | Freq: Four times a day (QID) | ORAL | 0 refills | Status: DC

## 2017-01-19 MED ORDER — LORAZEPAM 1 MG PO TABS: ORAL_TABLET | ORAL | 1 refills | Status: DC

## 2017-01-19 MED ORDER — METOPROLOL TARTRATE 25 MG PO TABS: 25.0000 mg | ORAL_TABLET | Freq: Two times a day (BID) | ORAL | 3 refills | Status: DC

## 2017-01-19 NOTE — Progress Notes (Signed)
Subjective:    Patient ID: [name], female    DOB: Dec 29, 1928, 83 y.o.   MRN: [id number]  Patient presents for Loose Stools (x4 days- pt states her stomach hurts) Patient with her daughter. She cannot give any accurate information because of her dementia. Dad states that she is complained of fatigue she has been laying around and has not been sleeping any more than usual. Her appetite is still been okay. She's had some diarrhea for the past 4-5 days but typically only 1 or 2 loose stools in the evening. The stool however is almost black. She is on Coumadin secondary to atrial fibrillation history. Her Coumadin was checked this morning and was supratherapeutic at 4.6. She is also noted an odor to her urine the past few weeks. She has not had any vomiting but stated today that she felt a lot of sick on her stomach.    Review Of Systems: UNABLE TO OBTAIN FROM PT due to dementia   GEN- denies fatigue, fever, weight loss,weakness, recent illness HEENT- denies eye drainage, change in vision, nasal discharge, CVS- denies chest pain, palpitations RESP- denies SOB, cough, wheeze ABD- denies N/V, change in stools, abd pain GU- denies dysuria, hematuria, dribbling, incontinence MSK- denies joint pain, muscle aches, injury Neuro- denies headache, dizziness, syncope, seizure activity       Objective:    BP 138/78   Pulse 67   Temp 98.1 F (36.7 C) (Oral)   Resp 16   SpO2 96%  GEN- NAD, alert and oriented x person sitting in wheelchair  HEENT- PERRL, EOMI, non injected sclera, pink conjunctiva, MMM, oropharynx clear Neck- Supple, no LAD CVS- RRR, no murmur RESP-CTAB ABD-NABS,soft,NT,ND, no CVA tenderness GU- Cath urine obtained only small amount  Rectum normal tone, soft stool in vault FOBT POSITIVE, Black stool on gloved finger  EXT- No edema Pulses- Radial 2+        Assessment & Plan:      Problem List Items Addressed This Visit      Unprioritized   Anemia   Dementia    Relevant Medications   LORazepam (ATIVAN) 1 MG tablet   UTI (urinary tract infection)    Her urinalysis was done based on the cath specimen which shows significant concern for infection she then was able to urinate again in the hat later in the visit and culture was sent. Labs were also obtained CBC performed showed a drop in her hemoglobin her baseline is about 11.8 down to 10.8 my concern is with her being on Coumadin and being supratherapeutic she's had some diarrhea stools and blood in the stools that she has GI bleed. Unfortunately cannot gauge her because of her dementia. There are no signs of any hypotension at this time. After discussion with her daughter think is best that we send her to the hospital where she can be further evaluated.      Relevant Medications   cephALEXin (KEFLEX) 250 MG capsule   Other Relevant Orders   CBC (Completed)   Comprehensive metabolic panel   Urine Culture    Other Visit Diagnoses    Gastrointestinal hemorrhage with melena    -  Primary   Relevant Orders   Urinalysis, Routine w reflex microscopic (Completed)   Diarrhea, unspecified type       Diarrhea may be a result of GI bleed or other infection   Supratherapeutic INR       Value based on this AM INR  Note: This dictation was prepared with Dragon dictation along with smaller phrase technology. Any transcriptional errors that result from this process are unintentional.

## 2017-01-19 NOTE — Patient Instructions (Signed)
F/U Go to ER

## 2017-01-19 NOTE — Assessment & Plan Note (Signed)
Her urinalysis was done based on the cath specimen which shows significant concern for infection she then was able to urinate again in the hat later in the visit and culture was sent. Labs were also obtained CBC performed showed a drop in her hemoglobin her baseline is about 11.8 down to 10.8 my concern is with her being on Coumadin and being supratherapeutic she's had some diarrhea stools and blood in the stools that she has GI bleed. Unfortunately cannot gauge her because of her dementia. There are no signs of any hypotension at this time. After discussion with her daughter think is best that we send her to the hospital where she can be further evaluated.

## 2017-01-19 NOTE — ED Triage Notes (Signed)
Pt has dementia. Per family, pt having loose stools x 4-5 days that appear dark. Pt went to pcp and hemoccult +. Pt had PT/INR levels checked and was told not to take her coumadin today or tomorrow. Pt has no complaints.

## 2017-01-20 DIAGNOSIS — E876 Hypokalemia: Secondary | ICD-10-CM | POA: Diagnosis not present

## 2017-01-20 LAB — COMPREHENSIVE METABOLIC PANEL
AG Ratio: 1 (calc) (ref 1.0–2.5)
ALBUMIN MSPROF: 3.2 g/dL — AB (ref 3.6–5.1)
ALKALINE PHOSPHATASE (APISO): 71 U/L (ref 33–130)
ALT: 4 U/L — ABNORMAL LOW (ref 6–29)
AST: 13 U/L (ref 10–35)
BILIRUBIN TOTAL: 0.4 mg/dL (ref 0.2–1.2)
BUN/Creatinine Ratio: 13 (calc) (ref 6–22)
BUN: 19 mg/dL (ref 7–25)
CHLORIDE: 105 mmol/L (ref 98–110)
CO2: 19 mmol/L — ABNORMAL LOW (ref 20–32)
CREATININE: 1.48 mg/dL — AB (ref 0.60–0.88)
Calcium: 8.5 mg/dL — ABNORMAL LOW (ref 8.6–10.4)
GLOBULIN: 3.2 g/dL (ref 1.9–3.7)
Glucose, Bld: 96 mg/dL (ref 65–99)
POTASSIUM: 3.6 mmol/L (ref 3.5–5.3)
Sodium: 137 mmol/L (ref 135–146)
Total Protein: 6.4 g/dL (ref 6.1–8.1)

## 2017-01-20 LAB — POC OCCULT BLOOD, ED: FECAL OCCULT BLD: NEGATIVE

## 2017-01-20 MED ORDER — POTASSIUM CHLORIDE ER 10 MEQ PO TBCR: 10.0000 meq | EXTENDED_RELEASE_TABLET | Freq: Two times a day (BID) | ORAL | 0 refills | Status: DC

## 2017-01-20 MED ORDER — POTASSIUM CHLORIDE CRYS ER 20 MEQ PO TBCR
40.0000 meq | EXTENDED_RELEASE_TABLET | Freq: Once | ORAL | Status: AC
  Administered 2017-01-20: 40 meq via ORAL
  Filled 2017-01-20: qty 2

## 2017-01-20 NOTE — ED Provider Notes (Signed)
TIME SEEN: 12:58 AM  CHIEF COMPLAINT: Diarrhea, possible blood in the stool  HPI: Patient is an 83 year old female with history of atrial fibrillation on Coumadin, chronic kidney disease, hypertension, hyperlipidemia who went and saw her primary care doctor today Dr. Jeanice Lim for concerns for diarrhea for the past 2 days. She was noted to have black stool at her primary care physician's office and they sent her to the emergency department. No bright red blood per rectum. No vomiting. No fever. Patient is smiling and at her neurologic baseline per family. She has no complaints of pain. Denies history of endoscopy. States her last colonoscopy was many years ago. Daughter does report that previous to this stool becoming black, family gave the patient Pepto-Bismol. She states previous to receiving Pepto-Bismol the stool was dark brown.  ROS: Level V cavity on for dementia  PAST MEDICAL HISTORY/PAST SURGICAL HISTORY:  Past Medical History:  Diagnosis Date  . Anemia    Hemoglobin of 11.6 in 01/2010; 12.1 in 08/2010  . Atrial fibrillation (HCC)    chronic requiring anticoagulation  . Chronic anticoagulation   . Chronic kidney disease    Stage III; creatinine of 1.7 in 04/2007 and 1.5 2009; 1.7 in 04/2010  . Hyperlipidemia    Total cholesterol of 226, triglycerides of 90, HDL of 49 and LDL of 159 in 01/2010  . Hypertension     MEDICATIONS:  Prior to Admission medications   Medication Sig Start Date End Date Taking? Authorizing Provider  amLODipine (NORVASC) 10 MG tablet Take 1 tablet (10 mg total) by mouth daily. 03/19/16   [name], MD  aspirin 81 MG tablet Take 81 mg by mouth daily.    [provider]  cephALEXin (KEFLEX) 250 MG capsule Take 1 capsule (250 mg total) by mouth 4 (four) times daily. 01/19/17   Ankeny, Velna Hatchet, MD  clotrimazole-betamethasone (LOTRISONE) cream Apply 1 application topically 2 (two) times daily. 10/26/16   [name], MD  donepezil (ARICEPT) 10  MG tablet Take 1 tablet (10 mg total) by mouth daily with breakfast. 09/29/16   [name], MD  furosemide (LASIX) 40 MG tablet Take 1 tablet (40 mg total) by mouth daily. 09/05/15   Rathdrum, Velna Hatchet, MD  LORazepam (ATIVAN) 1 MG tablet TAKE ONE TABLET BY MOUTH ONCE DAILY AT BEDTIME AS NEEDED FOR ANXIETY OR  AGITATION 01/19/17   [name], MD  metoprolol tartrate (LOPRESSOR) 25 MG tablet Take 1 tablet (25 mg total) by mouth 2 (two) times daily. 01/19/17 01/19/18  [name], MD  pravastatin (PRAVACHOL) 10 MG tablet Take 1 tablet (10 mg total) by mouth daily. 10/26/16   [name], MD  QUEtiapine (SEROQUEL) 50 MG tablet Take 1 tablet (50 mg total) by mouth daily with breakfast. 10/26/16   [name], MD  ranitidine (ZANTAC) 150 MG tablet TAKE ONE TABLET BY MOUTH TWICE DAILY 10/07/16   [name], MD  valACYclovir (VALTREX) 1000 MG tablet Take 1 tablet (1,000 mg total) by mouth 3 (three) times daily. 09/17/16   [name], MD  warfarin (COUMADIN) 2.5 MG tablet Take 1 tablet on Mondays and Thursdays only along with warfarin  daily 09/13/16   Laqueta Linden, MD  warfarin (COUMADIN) 4 MG tablet Take 1 tablet (4 mg total) by mouth daily. 09/13/16   Laqueta Linden, MD    ALLERGIES:  No Known Allergies  SOCIAL HISTORY:  Social History  Substance Use Topics  . Smoking  status: Never Smoker  . Smokeless tobacco: Never Used  . Alcohol use No    FAMILY HISTORY: Family History  Problem Relation Age of Onset  . Diabetes Mother   . Heart disease Father   . Diabetes Sister   . Diabetes Brother     EXAM: BP (!) 175/79   Pulse 64   Temp 98.3 F (36.8 C) (Oral)   Resp 20   SpO2 100%  CONSTITUTIONAL: Alert and oriented To person. She is demented and does not answer questions appropriately. She is elderly and thin but in no distress, smiling and laughing HEAD: Normocephalic EYES: Conjunctivae clear, pupils appear equal, EOMI ENT: normal nose;  moist mucous membranes NECK: Supple, no meningismus, no nuchal rigidity, no LAD  CARD: RRR; S1 and S2 appreciated; no murmurs, no clicks, no rubs, no gallops RESP: Normal chest excursion without splinting or tachypnea; breath sounds clear and equal bilaterally; no wheezes, no rhonchi, no rales, no hypoxia or respiratory distress, speaking full sentences ABD/GI: Normal bowel sounds; non-distended; soft, non-tender, no rebound, no guarding, no peritoneal signs, no hepatosplenomegaly RECTAL:  Normal rectal tone, no gross blood or melena, guaiac negative, stool is black, no hemorrhoids appreciated, nontender rectal exam, no fecal impaction BACK:  The back appears normal and is non-tender to palpation, there is no CVA tenderness EXT: Normal ROM in all joints; non-tender to palpation; no edema; normal capillary refill; no cyanosis, no calf tenderness or swelling    SKIN: Normal color for age and race; warm; no rash NEURO: Moves all extremities equally PSYCH: The patient's mood and manner are appropriate. Grooming and personal hygiene are appropriate.  MEDICAL DECISION MAKING: Patient here with diarrhea. Stool is now formed on rectal exam. Abdominal exam completely benign. No vomiting. No fever. At her neurologic baseline per family, smiling and laughing. Patient has been noted to have black stools at her primary care doctor's office and that is why she was sent to the emergency department. She has no bright red blood per rectum and her Hemoccult is completely negative. I think her black stools are from getting Pepto-Bismol recently. The only abnormality that we see on her labs is that she has a mild hypokalemia with potassium of 2.9 without EKG changes. Given oral replacement here and will discharge with potassium and have her follow-up with her primary care physician next week to have this rechecked. Her hemoglobin is 10.2 which are her baseline. Her INRs therapeutic. She does have mildly elevated creatinine  but this is also normal for her. Family is extremely happy with this plan and agree with plan for close follow-up with her primary doctor. We did discussed return precautions.   At this time, I do not feel there is any life-threatening condition present. I have reviewed and discussed all results (EKG, imaging, lab, urine as appropriate) and exam findings with patient/family. I have reviewed nursing notes and appropriate previous records.  I feel the patient is safe to be discharged home without further emergent workup and can continue workup as an outpatient as needed. Discussed usual and customary return precautions. Patient/family verbalize understanding and are comfortable with this plan.  Outpatient follow-up has been provided if needed. All questions have been answered.      EKG Interpretation  Date/Time:  Thursday January 20 2017 01:06:16 EDT Ventricular Rate:  57 PR Interval:    QRS Duration: 88 QT Interval:  515 QTC Calculation: 502 R Axis:   30 Text Interpretation:  Atrial fibrillation Anteroseptal infarct, age indeterminate Prolonged  QT interval No significant change since last tracing Confirmed by Tyree Fluharty, Baxter Hire [phone]) on 01/20/2017 1:13:48 AM         Trayon Krantz, Layla Maw, DO 01/20/17 [id number]

## 2017-01-20 NOTE — ED Notes (Signed)
Hard to obtain history form patient due to dementia.  Patient has no complaints of pain.  Patient states she only wants something to eat.  Family upset due to wait times, said they would like to go as soon as they can.  Patient placed on bedpan due to request to urinate.

## 2017-01-21 ENCOUNTER — Telehealth: Payer: Self-pay | Admitting: Family Medicine

## 2017-01-21 ENCOUNTER — Other Ambulatory Visit: Payer: Self-pay | Admitting: *Deleted

## 2017-01-21 DIAGNOSIS — D649 Anemia, unspecified: Secondary | ICD-10-CM

## 2017-01-21 NOTE — Telephone Encounter (Signed)
-----   Message from Durwin Nora Six, LPN sent at 1/61/0960 12:56 PM EDT ----- Regarding: RE: cALL PT DAUGHTER Call placed to [name].   Advised of MD recommendations. Will bring patient back on Tuesday for repeat labs.  ----- Message ----- From: [name], MD Sent: 01/20/2017   5:52 PM To: Durwin Nora Six, LPN Subject: cALL PT DAUGHTER                                 I saw ER note, she had blood in stool, in our office, they may not have had an actual piece of stool on the card.  Her Hb was also 1 pooint lower than her baseline She needs a lab check for Hemoglobin next week- Monday or Tuesday to make sure her hemoglobin does  Not keep going down,  If she gets weaker, or starts having increased stools take her back to ER

## 2017-01-23 LAB — URINE CULTURE
MICRO NUMBER:: 81036323
SPECIMEN QUALITY: ADEQUATE

## 2017-01-25 ENCOUNTER — Other Ambulatory Visit: Payer: Medicare Other

## 2017-01-25 DIAGNOSIS — D649 Anemia, unspecified: Secondary | ICD-10-CM | POA: Diagnosis not present

## 2017-01-25 LAB — CBC WITH DIFFERENTIAL/PLATELET
BASOS PCT: 1 %
Basophils Absolute: 94 cells/uL (ref 0–200)
EOS PCT: 10 %
Eosinophils Absolute: 940 cells/uL — ABNORMAL HIGH (ref 15–500)
HEMATOCRIT: 32.2 % — AB (ref 35.0–45.0)
Hemoglobin: 10.7 g/dL — ABNORMAL LOW (ref 11.7–15.5)
LYMPHS ABS: 2641 {cells}/uL (ref 850–3900)
MCH: 30.8 pg (ref 27.0–33.0)
MCHC: 33.2 g/dL (ref 32.0–36.0)
MCV: 92.8 fL (ref 80.0–100.0)
MONOS PCT: 9.9 %
MPV: 10.3 fL (ref 7.5–12.5)
NEUTROS ABS: 4794 {cells}/uL (ref 1500–7800)
Neutrophils Relative %: 51 %
Platelets: 427 10*3/uL — ABNORMAL HIGH (ref 140–400)
RBC: 3.47 10*6/uL — AB (ref 3.80–5.10)
RDW: 12.8 % (ref 11.0–15.0)
Total Lymphocyte: 28.1 %
WBC mixed population: 931 cells/uL (ref 200–950)
WBC: 9.4 10*3/uL (ref 3.8–10.8)

## 2017-01-31 ENCOUNTER — Encounter: Payer: Self-pay | Admitting: *Deleted

## 2017-03-09 ENCOUNTER — Telehealth: Payer: Self-pay | Admitting: *Deleted

## 2017-03-09 NOTE — Telephone Encounter (Signed)
Please give pt's daughter a call concerning her apts

## 2017-03-09 NOTE — Telephone Encounter (Signed)
LM for daughter Steward DroneBrenda to call me or Raechel Acheerry Goins back to make INR appt.

## 2017-03-10 NOTE — Telephone Encounter (Signed)
Done.  Appt made for 03/11/17

## 2017-03-11 ENCOUNTER — Ambulatory Visit (INDEPENDENT_AMBULATORY_CARE_PROVIDER_SITE_OTHER): Payer: Medicare Other | Admitting: *Deleted

## 2017-03-11 DIAGNOSIS — Z5181 Encounter for therapeutic drug level monitoring: Secondary | ICD-10-CM

## 2017-03-11 DIAGNOSIS — I749 Embolism and thrombosis of unspecified artery: Secondary | ICD-10-CM

## 2017-03-11 DIAGNOSIS — I4891 Unspecified atrial fibrillation: Secondary | ICD-10-CM

## 2017-03-11 LAB — POCT INR: INR: 3.8

## 2017-03-11 NOTE — Progress Notes (Signed)
Hold coumadin tonight then decrease dose to 4mg  daily except 2.5mg  on Sundays and Wednesdays Recheck in 3 weeks

## 2017-03-28 ENCOUNTER — Encounter: Payer: Medicare Other | Admitting: Family Medicine

## 2017-03-29 ENCOUNTER — Encounter: Payer: Self-pay | Admitting: Family Medicine

## 2017-03-30 ENCOUNTER — Encounter: Payer: Self-pay | Admitting: *Deleted

## 2017-04-12 ENCOUNTER — Ambulatory Visit: Payer: Medicare Other | Admitting: Family Medicine

## 2017-04-19 ENCOUNTER — Other Ambulatory Visit: Payer: Self-pay | Admitting: Family Medicine

## 2017-04-20 ENCOUNTER — Ambulatory Visit (INDEPENDENT_AMBULATORY_CARE_PROVIDER_SITE_OTHER): Payer: Medicare Other | Admitting: *Deleted

## 2017-04-20 ENCOUNTER — Ambulatory Visit (INDEPENDENT_AMBULATORY_CARE_PROVIDER_SITE_OTHER): Payer: Medicare Other | Admitting: Family Medicine

## 2017-04-20 ENCOUNTER — Encounter: Payer: Self-pay | Admitting: Family Medicine

## 2017-04-20 VITALS — BP 130/72 | HR 82 | Temp 97.8°F | Resp 16

## 2017-04-20 DIAGNOSIS — I10 Essential (primary) hypertension: Secondary | ICD-10-CM

## 2017-04-20 DIAGNOSIS — F02818 Dementia in other diseases classified elsewhere, unspecified severity, with other behavioral disturbance: Secondary | ICD-10-CM

## 2017-04-20 DIAGNOSIS — Z23 Encounter for immunization: Secondary | ICD-10-CM | POA: Diagnosis not present

## 2017-04-20 DIAGNOSIS — I749 Embolism and thrombosis of unspecified artery: Secondary | ICD-10-CM

## 2017-04-20 DIAGNOSIS — N183 Chronic kidney disease, stage 3 unspecified: Secondary | ICD-10-CM

## 2017-04-20 DIAGNOSIS — D649 Anemia, unspecified: Secondary | ICD-10-CM | POA: Diagnosis not present

## 2017-04-20 DIAGNOSIS — F0281 Dementia in other diseases classified elsewhere with behavioral disturbance: Secondary | ICD-10-CM

## 2017-04-20 DIAGNOSIS — G301 Alzheimer's disease with late onset: Secondary | ICD-10-CM

## 2017-04-20 DIAGNOSIS — Z5181 Encounter for therapeutic drug level monitoring: Secondary | ICD-10-CM | POA: Diagnosis not present

## 2017-04-20 DIAGNOSIS — I4891 Unspecified atrial fibrillation: Secondary | ICD-10-CM

## 2017-04-20 LAB — CBC WITH DIFFERENTIAL/PLATELET
BASOS PCT: 0.8 %
Basophils Absolute: 59 cells/uL (ref 0–200)
EOS ABS: 1584 {cells}/uL — AB (ref 15–500)
Eosinophils Relative: 21.4 %
HEMATOCRIT: 33.6 % — AB (ref 35.0–45.0)
HEMOGLOBIN: 11.2 g/dL — AB (ref 11.7–15.5)
LYMPHS ABS: 2324 {cells}/uL (ref 850–3900)
MCH: 31 pg (ref 27.0–33.0)
MCHC: 33.3 g/dL (ref 32.0–36.0)
MCV: 93.1 fL (ref 80.0–100.0)
MONOS PCT: 7.6 %
MPV: 11.2 fL (ref 7.5–12.5)
NEUTROS ABS: 2871 {cells}/uL (ref 1500–7800)
Neutrophils Relative %: 38.8 %
Platelets: 223 10*3/uL (ref 140–400)
RBC: 3.61 10*6/uL — ABNORMAL LOW (ref 3.80–5.10)
RDW: 13.3 % (ref 11.0–15.0)
Total Lymphocyte: 31.4 %
WBC: 7.4 10*3/uL (ref 3.8–10.8)
WBCMIX: 562 {cells}/uL (ref 200–950)

## 2017-04-20 LAB — BASIC METABOLIC PANEL
BUN / CREAT RATIO: 17 (calc) (ref 6–22)
BUN: 26 mg/dL — ABNORMAL HIGH (ref 7–25)
CO2: 20 mmol/L (ref 20–32)
Calcium: 9 mg/dL (ref 8.6–10.4)
Chloride: 108 mmol/L (ref 98–110)
Creat: 1.51 mg/dL — ABNORMAL HIGH (ref 0.60–0.88)
GLUCOSE: 79 mg/dL (ref 65–99)
Potassium: 3.8 mmol/L (ref 3.5–5.3)
SODIUM: 139 mmol/L (ref 135–146)

## 2017-04-20 LAB — POCT INR: INR: 2.9

## 2017-04-20 MED ORDER — LORAZEPAM 1 MG PO TABS: ORAL_TABLET | ORAL | 1 refills | Status: DC

## 2017-04-20 MED ORDER — QUETIAPINE FUMARATE 50 MG PO TABS: 50.0000 mg | ORAL_TABLET | Freq: Every day | ORAL | 3 refills | Status: DC

## 2017-04-20 NOTE — Assessment & Plan Note (Signed)
Well controlled F/U coumadin clinic this evening

## 2017-04-20 NOTE — Patient Instructions (Addendum)
F/U 4 months PHYSICAL  Restart the seroquel

## 2017-04-20 NOTE — Assessment & Plan Note (Signed)
Continue aricept, restart seroquel if not taking, continue ativan

## 2017-04-20 NOTE — Addendum Note (Signed)
Addended by: Phillips OdorSIX, CHRISTINA H on: 04/20/2017 12:16 PM   Modules accepted: Orders

## 2017-04-20 NOTE — Progress Notes (Signed)
   Subjective:    Patient ID: [name], female    DOB: 1928/09/02, 83 y.o.   MRN: [id number]  Patient presents for Follow-up (is not fasting)  Here to follow-up chronic medical problems.  Medications reviewed.  She still followed by cardiology on Coumadin therapy for A. fib history.  Coumadin was supratherapeutic in November again.  She has had some anemia she had 1 point drop in her hemoglobin when she presented with some diarrhea had supratherapeutic Coumadin level back in September.  She was evaluated in the emergency room nothing acute was found.  Her hemoglobin recheck was stable around 10.7 /10.8.  Dementia she is maintained on Aricept  Reviewed meds with daughter does not have her seroquel with her  Due for flu shot No concerns today per daughter, she is doing well  Review Of Systems: unable to obtain  GEN- denies fatigue, fever, weight loss,weakness, recent illness HEENT- denies eye drainage, change in vision, nasal discharge, CVS- denies chest pain, palpitations RESP- denies SOB, cough, wheeze ABD- denies N/V, change in stools, abd pain GU- denies dysuria, hematuria, dribbling, incontinence MSK- denies joint pain, muscle aches, injury Neuro- denies headache, dizziness, syncope, seizure activity       Objective:    BP 130/72   Pulse 82   Temp 97.8 F (36.6 C) (Oral)   Resp 16   SpO2 99%  GEN- NAD, alert and oriented x person, sitting in wheelchair  HEENT- PERRL, EOMI, non injected sclera, pink conjunctiva, MMM, oropharynx clear Neck- Supple, no thyromegaly CVS- RRR, no murmur RESP-CTAB ABD-NABS,soft,NT,ND EXT- No edema Pulses- Radial, DP- 2+        Assessment & Plan:      Problem List Items Addressed This Visit      Unprioritized   Chronic kidney disease   Relevant Orders   Basic metabolic panel   Hypertension    Well controlled F/U coumadin clinic this evening      Dementia - Primary    Continue aricept, restart seroquel if not taking,  continue ativan       Relevant Medications   QUEtiapine (SEROQUEL) 50 MG tablet   LORazepam (ATIVAN) 1 MG tablet   Anemia    Recheck Hb ensure stability No active bleeding      Relevant Orders   CBC with Differential/Platelet      Note: This dictation was prepared with Dragon dictation along with smaller phrase technology. Any transcriptional errors that result from this process are unintentional.

## 2017-04-20 NOTE — Assessment & Plan Note (Signed)
Recheck Hb ensure stability No active bleeding

## 2017-04-25 ENCOUNTER — Encounter: Payer: Self-pay | Admitting: *Deleted

## 2017-05-11 ENCOUNTER — Ambulatory Visit (INDEPENDENT_AMBULATORY_CARE_PROVIDER_SITE_OTHER): Payer: Medicare Other | Admitting: Family Medicine

## 2017-05-11 ENCOUNTER — Other Ambulatory Visit: Payer: Self-pay

## 2017-05-11 ENCOUNTER — Encounter: Payer: Self-pay | Admitting: Family Medicine

## 2017-05-11 ENCOUNTER — Ambulatory Visit (HOSPITAL_COMMUNITY)
Admission: RE | Admit: 2017-05-11 | Discharge: 2017-05-11 | Disposition: A | Discharge status: Home / Self Care | Payer: Medicare Other | Source: Ambulatory Visit | Attending: Family Medicine | Admitting: Family Medicine

## 2017-05-11 VITALS — BP 138/72 | HR 56 | Temp 97.9°F | Resp 16

## 2017-05-11 DIAGNOSIS — W06XXXA Fall from bed, initial encounter: Secondary | ICD-10-CM

## 2017-05-11 DIAGNOSIS — M5136 Other intervertebral disc degeneration, lumbar region: Secondary | ICD-10-CM | POA: Insufficient documentation

## 2017-05-11 DIAGNOSIS — M545 Low back pain, unspecified: Secondary | ICD-10-CM

## 2017-05-11 DIAGNOSIS — M47816 Spondylosis without myelopathy or radiculopathy, lumbar region: Secondary | ICD-10-CM | POA: Insufficient documentation

## 2017-05-11 DIAGNOSIS — S22080A Wedge compression fracture of T11-T12 vertebra, initial encounter for closed fracture: Secondary | ICD-10-CM | POA: Diagnosis not present

## 2017-05-11 DIAGNOSIS — F0390 Unspecified dementia without behavioral disturbance: Secondary | ICD-10-CM | POA: Insufficient documentation

## 2017-05-11 DIAGNOSIS — F02818 Dementia in other diseases classified elsewhere, unspecified severity, with other behavioral disturbance: Secondary | ICD-10-CM

## 2017-05-11 DIAGNOSIS — G301 Alzheimer's disease with late onset: Secondary | ICD-10-CM

## 2017-05-11 DIAGNOSIS — M533 Sacrococcygeal disorders, not elsewhere classified: Secondary | ICD-10-CM | POA: Diagnosis not present

## 2017-05-11 DIAGNOSIS — M4186 Other forms of scoliosis, lumbar region: Secondary | ICD-10-CM | POA: Diagnosis not present

## 2017-05-11 DIAGNOSIS — R2689 Other abnormalities of gait and mobility: Secondary | ICD-10-CM

## 2017-05-11 DIAGNOSIS — F0281 Dementia in other diseases classified elsewhere with behavioral disturbance: Secondary | ICD-10-CM

## 2017-05-11 NOTE — Progress Notes (Addendum)
   Subjective:    Patient ID: [name], female    DOB: 08-Aug-1928, 84 y.o.   MRN: [id number]  Patient presents for S/P Fall (brother states that she tried to get out of bed by herself and slid to floor- is now voicing c/o pain lower back)   Pt here with daughter. Friday morning son went to check on her, she had fallen out of bed and was sitting in the floor. The bedpan was turned over. This is actually wedged between and chair and the bed. They think she was trying to get up and slid down out of the bed. Son and daughter picked her up. But later that evening she began to complain of low back pain.Given Ibuprofen on Friday. They typically uses pillows to help keep her positioned in bed Using aspercreme  She continues to state her back hurts and her butt hurts when she sits down,   She is poor historian due to dementia   Daughter request hospital bed. She is getting more difficult to transfer out of her regular bed and per above, has fallen out a few times. She also requires a light weight wheelchair, that family can use to transport her out of the home to appointments    Review Of Systems:  GEN- denies fatigue, fever, weight loss,weakness, recent illness HEENT- denies eye drainage, change in vision, nasal discharge, CVS- denies chest pain, palpitations RESP- denies SOB, cough, wheeze ABD- denies N/V, change in stools, abd pain GU- +joint pain, muscle aches, injury Neuro- denies headache, dizziness, syncope, seizure activity       Objective:    BP 138/72   Pulse (!) 56   Temp 97.9 F (36.6 C) (Oral)   Resp 16   SpO2 100%  GEN- NAD, alert ,sitting wheelchair HEENT- PERRL, EOMI, non injected sclera, pink conjunctiva, MMM, oropharynx clear CVS- irregular rhythem, normal rate , no murmur RESP-CTAB ABD-NABS,soft,NT,ND Mild TTP lumbar spine, no bruising, decreased ROM, NT over scacrum, stands assisted, decreased ROM Spine, hips/legs EXT- No edema Pulses- Radial  2+         Assessment & Plan:      Problem List Items Addressed This Visit    None    Visit Diagnoses    Fall from bed, initial encounter    -  Primary   s/p unwitnessed fall, complaining of pain but difficult to tell with her dementia, with age high risk for fracture, will obtain xray lumbar and pelvis r/u fracture in her state Continue tylenol and topical for now Advised to avoid ibuprofen due to her comadin   Relevant Orders   DG Lumbar Spine Complete   DG Pelvis 1-2 Views   Acute midline low back pain without sciatica       Relevant Orders   DG Lumbar Spine Complete   DG Pelvis 1-2 Views      Note: This dictation was prepared with Dragon dictation along with smaller phrase technology. Any transcriptional errors that result from this process are unintentional.

## 2017-05-11 NOTE — Patient Instructions (Signed)
Get xrays done F/U as previous

## 2017-05-12 ENCOUNTER — Other Ambulatory Visit: Payer: Self-pay | Admitting: *Deleted

## 2017-05-12 DIAGNOSIS — M8008XA Age-related osteoporosis with current pathological fracture, vertebra(e), initial encounter for fracture: Secondary | ICD-10-CM

## 2017-05-13 ENCOUNTER — Ambulatory Visit (HOSPITAL_COMMUNITY)
Admission: RE | Admit: 2017-05-13 | Discharge: 2017-05-13 | Disposition: A | Discharge status: Home / Self Care | Payer: Medicare Other | Source: Ambulatory Visit | Attending: Family Medicine | Admitting: Family Medicine

## 2017-05-13 DIAGNOSIS — M4316 Spondylolisthesis, lumbar region: Secondary | ICD-10-CM | POA: Insufficient documentation

## 2017-05-13 DIAGNOSIS — M5136 Other intervertebral disc degeneration, lumbar region: Secondary | ICD-10-CM | POA: Insufficient documentation

## 2017-05-13 DIAGNOSIS — M5126 Other intervertebral disc displacement, lumbar region: Secondary | ICD-10-CM | POA: Insufficient documentation

## 2017-05-13 DIAGNOSIS — M4854XA Collapsed vertebra, not elsewhere classified, thoracic region, initial encounter for fracture: Secondary | ICD-10-CM | POA: Diagnosis not present

## 2017-05-13 DIAGNOSIS — M4186 Other forms of scoliosis, lumbar region: Secondary | ICD-10-CM | POA: Diagnosis not present

## 2017-05-13 DIAGNOSIS — M8008XA Age-related osteoporosis with current pathological fracture, vertebra(e), initial encounter for fracture: Secondary | ICD-10-CM

## 2017-05-13 DIAGNOSIS — M8088XA Other osteoporosis with current pathological fracture, vertebra(e), initial encounter for fracture: Secondary | ICD-10-CM | POA: Diagnosis present

## 2017-05-13 DIAGNOSIS — M48061 Spinal stenosis, lumbar region without neurogenic claudication: Secondary | ICD-10-CM | POA: Diagnosis not present

## 2017-05-24 DIAGNOSIS — R2689 Other abnormalities of gait and mobility: Secondary | ICD-10-CM | POA: Insufficient documentation

## 2017-05-24 NOTE — Assessment & Plan Note (Signed)
She would benefit from manual wheelchair to help family assist with ADLS,, caregiver must wheel/transport patient She has good trunk support for manual wheelchair

## 2017-05-24 NOTE — Assessment & Plan Note (Signed)
Progressive dementia, high risk for falls, aspiration, skin breakdown Would benefit from hpsital bed, to decease risk of fall, aspiration. Positioning will help prevent skin breakdown as well.  Pillows and wedges have been tried in regular bed but have not been successful

## 2017-05-25 ENCOUNTER — Telehealth: Payer: Self-pay | Admitting: *Deleted

## 2017-05-25 NOTE — Telephone Encounter (Signed)
Please give Steward DroneBrenda a call @ [phone], she's having a problem getting Ms. Bisaillon to the car

## 2017-05-26 NOTE — Telephone Encounter (Signed)
Appointment made

## 2017-06-24 ENCOUNTER — Other Ambulatory Visit: Payer: Self-pay | Admitting: *Deleted

## 2017-06-24 MED ORDER — WARFARIN SODIUM 4 MG PO TABS: 4.0000 mg | ORAL_TABLET | Freq: Every day | ORAL | 1 refills | Status: DC

## 2017-06-24 MED ORDER — WARFARIN SODIUM 2.5 MG PO TABS: ORAL_TABLET | ORAL | 1 refills | Status: DC

## 2017-06-24 NOTE — Telephone Encounter (Signed)
Patient needs refill on Coumadin sent to [facility] / tg

## 2017-06-24 NOTE — Telephone Encounter (Signed)
Refill complete to [facility]

## 2017-06-29 ENCOUNTER — Ambulatory Visit (INDEPENDENT_AMBULATORY_CARE_PROVIDER_SITE_OTHER): Payer: Medicare Other | Admitting: *Deleted

## 2017-06-29 DIAGNOSIS — Z5181 Encounter for therapeutic drug level monitoring: Secondary | ICD-10-CM | POA: Diagnosis not present

## 2017-06-29 DIAGNOSIS — I4891 Unspecified atrial fibrillation: Secondary | ICD-10-CM | POA: Diagnosis not present

## 2017-06-29 DIAGNOSIS — I749 Embolism and thrombosis of unspecified artery: Secondary | ICD-10-CM

## 2017-06-29 LAB — POCT INR: INR: 1.6

## 2017-06-29 NOTE — Patient Instructions (Signed)
Take 5mg  tonight then resume 4mg  daily except 2.5mg  on Sundays and Wednesdays Recheck in 4 weeks

## 2017-07-05 ENCOUNTER — Other Ambulatory Visit: Payer: Self-pay

## 2017-07-05 ENCOUNTER — Ambulatory Visit (INDEPENDENT_AMBULATORY_CARE_PROVIDER_SITE_OTHER): Payer: Medicare Other | Admitting: Family Medicine

## 2017-07-05 ENCOUNTER — Encounter: Payer: Self-pay | Admitting: Family Medicine

## 2017-07-05 VITALS — BP 130/62 | HR 62 | Temp 98.2°F | Resp 20

## 2017-07-05 DIAGNOSIS — J069 Acute upper respiratory infection, unspecified: Secondary | ICD-10-CM

## 2017-07-05 DIAGNOSIS — I749 Embolism and thrombosis of unspecified artery: Secondary | ICD-10-CM | POA: Diagnosis not present

## 2017-07-05 DIAGNOSIS — R6889 Other general symptoms and signs: Secondary | ICD-10-CM

## 2017-07-05 LAB — INFLUENZA A AND B AG, IMMUNOASSAY
INFLUENZA A ANTIGEN: NOT DETECTED
INFLUENZA B ANTIGEN: NOT DETECTED

## 2017-07-05 MED ORDER — AMOXICILLIN 400 MG/5ML PO SUSR
ORAL | 0 refills | Status: DC
Start: 2017-07-05 — End: 2017-09-23

## 2017-07-05 NOTE — Patient Instructions (Signed)
Take antibiotics if she gets worse Robitussin DM  Keep hydrated  F/U as needed

## 2017-07-05 NOTE — Progress Notes (Signed)
   Subjective:    Patient ID: [name], female    DOB: 1928/08/25, 84 y.o.   MRN: [id number]  Patient presents for Illness (loos stools, cough, congestion, nasal drainage)   Pt here with cough congestion, nasal drainage started last Friday. No fever. Had loose stool this morning after eating [place] stew which tends to giver her diarrhea. No change in mentation. Drinking fluids  She has been taking Robitussin DM    Daughter has also been sick with viral URI.     Review Of Systems:  GEN- denies fatigue, fever, weight loss,weakness, recent illness HEENT- denies eye drainage, change in vision, nasal discharge, CVS- denies chest pain, palpitations RESP- denies SOB, cough, wheeze ABD- denies N/V, change in stools, abd pain GU- denies dysuria, hematuria, dribbling, incontinence MSK- denies joint pain, muscle aches, injury Neuro- denies headache, dizziness, syncope, seizure activity       Objective:    BP 130/62   Pulse 62   Temp 98.2 F (36.8 C) (Oral)   Resp 20   SpO2 96%  GEN- NAD, alert , dementia  HEENT- PERRL, EOMI, non injected sclera, pink conjunctiva, MMM, oropharynx clear Neck- Supple, no  LAD CVS- RRR, no murmur RESP-mild upper airway congestion, no rales, no wheezing ABD-NABS,soft,NT,ND EXT- No edema Pulses- Radial  2+        Assessment & Plan:      Problem List Items Addressed This Visit    None    Visit Diagnoses    Acute URI    -  Primary   Treat URi though high risk for PNA with her age, dementia, flu neg, will give robitussin and amoxicillin liquid due to her difficulty swallowing a lot of pill. Oxygen sat looks good. Overall non toxic appearing    Flu-like symptoms       Relevant Orders   Influenza A and B Ag, Immunoassay (Completed)      Note: This dictation was prepared with Dragon dictation along with smaller phrase technology. Any transcriptional errors that result from this process are unintentional.

## 2017-07-06 ENCOUNTER — Encounter: Payer: Self-pay | Admitting: Family Medicine

## 2017-08-17 ENCOUNTER — Ambulatory Visit: Payer: Medicare Other | Admitting: Cardiovascular Disease

## 2017-09-05 ENCOUNTER — Ambulatory Visit (INDEPENDENT_AMBULATORY_CARE_PROVIDER_SITE_OTHER): Payer: Medicare Other | Admitting: *Deleted

## 2017-09-05 DIAGNOSIS — I749 Embolism and thrombosis of unspecified artery: Secondary | ICD-10-CM | POA: Diagnosis not present

## 2017-09-05 DIAGNOSIS — I4891 Unspecified atrial fibrillation: Secondary | ICD-10-CM | POA: Diagnosis not present

## 2017-09-05 DIAGNOSIS — Z5181 Encounter for therapeutic drug level monitoring: Secondary | ICD-10-CM

## 2017-09-05 LAB — POCT INR: INR: 2.3

## 2017-09-05 NOTE — Patient Instructions (Signed)
Continue coumadin  daily except 2.5mg  on Sundays and Wednesdays Recheck in 4 weeks

## 2017-09-23 ENCOUNTER — Encounter: Payer: Self-pay | Admitting: Family Medicine

## 2017-09-23 ENCOUNTER — Ambulatory Visit (INDEPENDENT_AMBULATORY_CARE_PROVIDER_SITE_OTHER): Payer: Medicare Other | Admitting: Family Medicine

## 2017-09-23 ENCOUNTER — Other Ambulatory Visit: Payer: Self-pay

## 2017-09-23 VITALS — BP 128/62 | HR 78 | Temp 98.3°F | Resp 16

## 2017-09-23 DIAGNOSIS — F0281 Dementia in other diseases classified elsewhere with behavioral disturbance: Secondary | ICD-10-CM

## 2017-09-23 DIAGNOSIS — J0100 Acute maxillary sinusitis, unspecified: Secondary | ICD-10-CM | POA: Diagnosis not present

## 2017-09-23 DIAGNOSIS — H9193 Unspecified hearing loss, bilateral: Secondary | ICD-10-CM

## 2017-09-23 DIAGNOSIS — G301 Alzheimer's disease with late onset: Secondary | ICD-10-CM

## 2017-09-23 DIAGNOSIS — I749 Embolism and thrombosis of unspecified artery: Secondary | ICD-10-CM | POA: Diagnosis not present

## 2017-09-23 MED ORDER — AMOXICILLIN 400 MG/5ML PO SUSR: ORAL | 0 refills | Status: DC

## 2017-09-23 NOTE — Patient Instructions (Addendum)
Referral to ENT  Give claritin for 1 week  Take antibiotics as prescribed F/U October [facility]

## 2017-09-23 NOTE — Progress Notes (Signed)
   Subjective:    Patient ID: [name], female    DOB: 03-03-1929, 84 y.o.   MRN: [id number]  Patient presents for Illness (x1 week- cough, nasal drainage, head congestion) Congestion,nasal drainage, thcik drainage from sinuses. No fever  given robitussin , no GI upset, no diarrhea Eating well    Would like to be referred to ENT for her hearing, saw [facility] last year hering is worse, would like hearing aide if possible, they told her hearing loss due to   Review Of Systems: Per Daughter above- pt can not give  GEN- denies fatigue, fever, weight loss,weakness, recent illness HEENT- denies eye drainage, change in vision, nasal discharge, CVS- denies chest pain, palpitations RESP- denies SOB, cough, wheeze ABD- denies N/V, change in stools, abd pain GU- denies dysuria, hematuria, dribbling, incontinence MSK- denies joint pain, muscle aches, injury Neuro- denies headache, dizziness, syncope, seizure activity       Objective:    BP 128/62   Pulse 78   Temp 98.3 F (36.8 C) (Oral)   Resp 16   SpO2 98%  GEN- NAD, alert and oriented x3 ,sitting in wheelchair ,very hard of hearing, can only hear in left ear for most part HEENT- PERRL, EOMI, non injected sclera, pink conjunctiva, MMM, oropharynx mild injection, TM clear bilat no effusion,  No maxillary sinus tenderness, inflammed turbinates,  Nasal drainage  Neck- Supple, shotty  LAD CVS- RRR, no murmur RESP-CTAB EXT- No edema Pulses- Radial 2+          Assessment & Plan:      Problem List Items Addressed This Visit      Unprioritized   Dementia    Other Visit Diagnoses    Acute maxillary sinusitis, recurrence not specified    -  Primary   Treat with liquid amoxicillin, allergy meds   Relevant Medications   amoxicillin (AMOXIL) 400 MG/5ML suspension   Decreased hearing of both ears       Referral to ENT at family request will be a mix of dementia and hearing loss    Relevant Orders   Ambulatory referral to  ENT      Note: This dictation was prepared with Dragon dictation along with smaller phrase technology. Any transcriptional errors that result from this process are unintentional.

## 2017-09-26 ENCOUNTER — Encounter: Payer: Self-pay | Admitting: Family Medicine

## 2017-10-12 ENCOUNTER — Ambulatory Visit (INDEPENDENT_AMBULATORY_CARE_PROVIDER_SITE_OTHER): Payer: Medicare Other | Admitting: *Deleted

## 2017-10-12 DIAGNOSIS — I4891 Unspecified atrial fibrillation: Secondary | ICD-10-CM

## 2017-10-12 DIAGNOSIS — Z5181 Encounter for therapeutic drug level monitoring: Secondary | ICD-10-CM

## 2017-10-12 DIAGNOSIS — I749 Embolism and thrombosis of unspecified artery: Secondary | ICD-10-CM | POA: Diagnosis not present

## 2017-10-12 LAB — POCT INR: INR: 3.6 — AB (ref 2.0–3.0)

## 2017-10-12 MED ORDER — WARFARIN SODIUM 4 MG PO TABS: ORAL_TABLET | ORAL | 3 refills | Status: DC

## 2017-10-12 NOTE — Patient Instructions (Signed)
Hold coumadin tonight then resume 4mg  daily except 2.5mg  on Sundays and Wednesdays Recheck in 4 weeks

## 2017-11-09 ENCOUNTER — Ambulatory Visit (INDEPENDENT_AMBULATORY_CARE_PROVIDER_SITE_OTHER): Payer: Medicare Other | Admitting: *Deleted

## 2017-11-09 DIAGNOSIS — I749 Embolism and thrombosis of unspecified artery: Secondary | ICD-10-CM | POA: Diagnosis not present

## 2017-11-09 DIAGNOSIS — Z5181 Encounter for therapeutic drug level monitoring: Secondary | ICD-10-CM

## 2017-11-09 DIAGNOSIS — I4891 Unspecified atrial fibrillation: Secondary | ICD-10-CM | POA: Diagnosis not present

## 2017-11-09 LAB — POCT INR: INR: 2.6 (ref 2.0–3.0)

## 2017-11-09 NOTE — Patient Instructions (Signed)
Continue coumadin 4mg  daily except 2.5mg  on Sundays and Wednesdays Recheck in 4 weeks

## 2017-11-10 ENCOUNTER — Ambulatory Visit (INDEPENDENT_AMBULATORY_CARE_PROVIDER_SITE_OTHER): Payer: Medicare Other | Admitting: Otolaryngology

## 2017-12-06 ENCOUNTER — Other Ambulatory Visit: Payer: Self-pay | Admitting: Family Medicine

## 2017-12-06 DIAGNOSIS — G301 Alzheimer's disease with late onset: Principal | ICD-10-CM

## 2017-12-06 DIAGNOSIS — F02818 Dementia in other diseases classified elsewhere, unspecified severity, with other behavioral disturbance: Secondary | ICD-10-CM

## 2017-12-06 DIAGNOSIS — F0281 Dementia in other diseases classified elsewhere with behavioral disturbance: Secondary | ICD-10-CM

## 2017-12-06 NOTE — Telephone Encounter (Signed)
Ok to refill??  Last office visit 09/23/2017.  Last refill 04/20/2017, #1 refills.

## 2017-12-07 ENCOUNTER — Telehealth: Payer: Self-pay | Admitting: Family Medicine

## 2017-12-07 ENCOUNTER — Ambulatory Visit (INDEPENDENT_AMBULATORY_CARE_PROVIDER_SITE_OTHER): Payer: Medicare Other | Admitting: *Deleted

## 2017-12-07 DIAGNOSIS — I4891 Unspecified atrial fibrillation: Secondary | ICD-10-CM | POA: Diagnosis not present

## 2017-12-07 DIAGNOSIS — I749 Embolism and thrombosis of unspecified artery: Secondary | ICD-10-CM | POA: Diagnosis not present

## 2017-12-07 DIAGNOSIS — Z5181 Encounter for therapeutic drug level monitoring: Secondary | ICD-10-CM | POA: Diagnosis not present

## 2017-12-07 LAB — POCT INR: INR: 2 (ref 2.0–3.0)

## 2017-12-07 MED ORDER — WARFARIN SODIUM 4 MG PO TABS: ORAL_TABLET | ORAL | 3 refills | Status: DC

## 2017-12-07 NOTE — Telephone Encounter (Signed)
Patient may use Miralax as needed.   Call placed to patient daughter Steward DroneBrenda to inquire. LMTRC.

## 2017-12-07 NOTE — Telephone Encounter (Signed)
[name] called in stating that [name] is constipated wants us to give suggestions for otc remedies preferably liquid of what she can use.

## 2017-12-07 NOTE — Patient Instructions (Signed)
Take coumadin 4mg  tonight then resume 4mg  daily except 2.5mg  on Sundays and Wednesdays Recheck in 5 weeks

## 2017-12-08 NOTE — Telephone Encounter (Signed)
Call placed to patient. LMTRC.  

## 2017-12-09 NOTE — Telephone Encounter (Signed)
Multiple calls placed to patient with no answer and no return call.   Message to be closed.  

## 2017-12-20 DIAGNOSIS — H838X3 Other specified diseases of inner ear, bilateral: Secondary | ICD-10-CM | POA: Diagnosis not present

## 2017-12-20 DIAGNOSIS — H903 Sensorineural hearing loss, bilateral: Secondary | ICD-10-CM | POA: Diagnosis not present

## 2017-12-20 DIAGNOSIS — H6123 Impacted cerumen, bilateral: Secondary | ICD-10-CM | POA: Diagnosis not present

## 2018-02-08 ENCOUNTER — Ambulatory Visit (INDEPENDENT_AMBULATORY_CARE_PROVIDER_SITE_OTHER): Payer: Medicare Other | Admitting: *Deleted

## 2018-02-08 DIAGNOSIS — I749 Embolism and thrombosis of unspecified artery: Secondary | ICD-10-CM | POA: Diagnosis not present

## 2018-02-08 DIAGNOSIS — Z5181 Encounter for therapeutic drug level monitoring: Secondary | ICD-10-CM | POA: Diagnosis not present

## 2018-02-08 DIAGNOSIS — I4891 Unspecified atrial fibrillation: Secondary | ICD-10-CM | POA: Diagnosis not present

## 2018-02-08 LAB — POCT INR: INR: 1.7 — AB (ref 2.0–3.0)

## 2018-02-08 NOTE — Patient Instructions (Signed)
Take coumadin 5mg  tonight then increase dose to 4mg  daily except 2.5mg  on Wednesdays Recheck in 4 weeks

## 2018-02-22 ENCOUNTER — Other Ambulatory Visit: Payer: Self-pay

## 2018-02-22 ENCOUNTER — Encounter: Payer: Self-pay | Admitting: Family Medicine

## 2018-02-22 ENCOUNTER — Ambulatory Visit (INDEPENDENT_AMBULATORY_CARE_PROVIDER_SITE_OTHER): Payer: Medicare Other | Admitting: Family Medicine

## 2018-02-22 VITALS — BP 128/64 | HR 82 | Temp 98.1°F | Resp 16

## 2018-02-22 DIAGNOSIS — L299 Pruritus, unspecified: Secondary | ICD-10-CM

## 2018-02-22 DIAGNOSIS — Z760 Encounter for issue of repeat prescription: Secondary | ICD-10-CM | POA: Diagnosis not present

## 2018-02-22 DIAGNOSIS — K219 Gastro-esophageal reflux disease without esophagitis: Secondary | ICD-10-CM

## 2018-02-22 DIAGNOSIS — G301 Alzheimer's disease with late onset: Secondary | ICD-10-CM | POA: Diagnosis not present

## 2018-02-22 DIAGNOSIS — F02818 Dementia in other diseases classified elsewhere, unspecified severity, with other behavioral disturbance: Secondary | ICD-10-CM

## 2018-02-22 DIAGNOSIS — I749 Embolism and thrombosis of unspecified artery: Secondary | ICD-10-CM

## 2018-02-22 DIAGNOSIS — F0281 Dementia in other diseases classified elsewhere with behavioral disturbance: Secondary | ICD-10-CM

## 2018-02-22 MED ORDER — OMEPRAZOLE 40 MG PO CPDR: 40.0000 mg | DELAYED_RELEASE_CAPSULE | Freq: Every day | ORAL | 0 refills | Status: DC

## 2018-02-22 MED ORDER — CETIRIZINE HCL 10 MG PO TBDP: 10.0000 mg | ORAL_TABLET | Freq: Every day | ORAL | 0 refills | Status: DC

## 2018-02-22 MED ORDER — QUETIAPINE FUMARATE 50 MG PO TABS: 50.0000 mg | ORAL_TABLET | Freq: Every day | ORAL | 0 refills | Status: DC

## 2018-02-22 MED ORDER — HYDROXYZINE HCL 10 MG PO TABS: 10.0000 mg | ORAL_TABLET | Freq: Two times a day (BID) | ORAL | 0 refills | Status: DC | PRN

## 2018-02-22 MED ORDER — METOPROLOL TARTRATE 25 MG PO TABS: 25.0000 mg | ORAL_TABLET | Freq: Two times a day (BID) | ORAL | 0 refills | Status: DC

## 2018-02-22 MED ORDER — DONEPEZIL HCL 10 MG PO TABS: 10.0000 mg | ORAL_TABLET | Freq: Every day | ORAL | 0 refills | Status: DC

## 2018-02-22 NOTE — Patient Instructions (Addendum)
Focus on hydration of skin with vaseline or other ointment based moisturizers (cerave, aquaphore, eucerine) Switch to sensitive skin body wash or soap  Take a daily antihistamine to try and prevent itching if hydration doesn't work.  Follow up if its not improving.    Need to come in for routine visit to check on all conditions related to these medication refills.  Can try omeprazole or pepcid over the counter in place of zantac.  Or can switch to generic brand of zantac.  Pruritus Pruritus is an itching feeling. There are many different conditions and factors that can make your skin itchy. Dry skin is one of the most common causes of itching. Most cases of itching do not require medical attention. Itchy skin can turn into a rash. Follow these instructions at home: Watch your pruritus for any changes. Take these steps to help with your condition: Skin Care  Moisturize your skin as needed. A moisturizer that contains petroleum jelly is best for keeping moisture in your skin.  Take or apply medicines only as directed by your health care provider. This may include: ? Corticosteroid cream. ? Anti-itch lotions. ? Oral anti-histamines.  Apply cool compresses to the affected areas.  Try taking a bath with: ? Epsom salts. Follow the instructions on the packaging. You can get these at your local pharmacy or grocery store. ? Baking soda. Pour a small amount into the bath as directed by your health care provider. ? Colloidal oatmeal. Follow the instructions on the packaging. You can get this at your local pharmacy or grocery store.  Try applying baking soda paste to your skin. Stir water into baking soda until it reaches a paste-like consistency.  Do not scratch your skin.  Avoid hot showers or baths, which can make itching worse. A cold shower may help with itching as long as you use a moisturizer after.  Avoid scented soaps, detergents, and perfumes. Use gentle soaps, detergents,  perfumes, and other cosmetic products. General instructions  Avoid wearing tight clothes.  Keep a journal to help track what causes your itch. Write down: ? What you eat. ? What cosmetic products you use. ? What you drink. ? What you wear. This includes jewelry.  Use a humidifier. This keeps the air moist, which helps to prevent dry skin. Contact a health care provider if:  The itching does not go away after several days.  You sweat at night.  You have weight loss.  You are unusually thirsty.  You urinate more than normal.  You are more tired than normal.  You have abdominal pain.  Your skin tingles.  You feel weak.  Your skin or the whites of your eyes look yellow (jaundice).  Your skin feels numb. This information is not intended to replace advice given to you by your health care provider. Make sure you discuss any questions you have with your health care provider. Document Released: 12/30/2010 Document Revised: 09/25/2015 Document Reviewed: 04/15/2014 Elsevier Interactive Patient Education  [employer].

## 2018-02-22 NOTE — Progress Notes (Signed)
Patient ID: [name], female    DOB: [birth year], 84 y.o.   MRN: [id number]  PCP: [name], MD  Chief Complaint  Patient presents with  . Itching    x3 weeks- intermittent generalized itching- no new exposure- scratching all over  . Medication Management    is currently on Zantac, but recalled- would like to switch     Subjective:   [name] is a 84 y.o. female, presents to clinic with CC of generalized itching and medication management, was on Zantac which has been recalled and would like to switch during the medication.  Presents with her daughter who provides most the medical history for her.  Patient has had chronic itching for several months but is worse in the past couple weeks with weather changes.  There is no particular rash or distribution but she seems to scratch all over and it has become slightly worse at night.  They have been applying lotion which helps a little bit.  Medication changes, no soaps or detergents that have changed, no one else who lives with her has any similar itching and no one else has a rash.  Tried no oral medications.  Patient is also on Zantac but since it has been recalled would like to have medication switched to treat her indigestion and acid reflux.   Patient Active Problem List   Diagnosis Date Noted  . Impaired gait and mobility 05/24/2017  . Weakness 04/28/2016  . Loss of weight 10/22/2013  . Encounter for therapeutic drug monitoring 06/04/2013  . Dementia (HCC) 05/18/2013  . UTI (urinary tract infection) 10/05/2012  . Chronic insomnia 06/30/2012  . Headache(784.0) 03/03/2012  . Eosinophilia 11/27/2011  . Peripheral vascular disease (HCC) 09/29/2011  . Chronic kidney disease   . Tobacco abuse, in remission   . Chronic anticoagulation   . 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 Hyperlipidemia 01/01/2010  . Anemia 04/09/2009  . Hypertension 04/09/2009  . ATRIAL FIBRILLATION, CHRONIC 04/09/2009       Prior to Admission medications   Medication Sig Start Date End Date Taking? Authorizing Provider  amLODipine (NORVASC) 10 MG tablet TAKE ONE TABLET BY MOUTH ONCE DAILY 04/19/17  Yes [place], Velna Hatchet, MD  aspirin 81 MG tablet Take 81 mg by mouth daily.   Yes [provider]  clotrimazole-betamethasone (LOTRISONE) cream Apply 1 application topically 2 (two) times daily. 10/26/16  Yes [place], Velna Hatchet, MD  donepezil (ARICEPT) 10 MG tablet Take 1 tablet (10 mg total) by mouth daily with breakfast. 09/29/16  Yes [place], Velna Hatchet, MD  LORazepam (ATIVAN) 1 MG tablet TAKE 1 TABLET BY MOUTH AT BEDTIME AS NEEDED FOR ANXIETY AND AGITATION 12/06/17  Yes [place], Velna Hatchet, MD  pravastatin (PRAVACHOL) 10 MG tablet TAKE 1 TABLET BY MOUTH ONCE DAILY 12/06/17  Yes [place], Velna Hatchet, MD  QUEtiapine (SEROQUEL) 50 MG tablet Take 1 tablet (50 mg total) by mouth daily with breakfast. 04/20/17  Yes [place], Velna Hatchet, MD  ranitidine (ZANTAC) 150 MG tablet TAKE ONE TABLET BY MOUTH TWICE DAILY 10/07/16  Yes Milinda Antis F, MD  warfarin (COUMADIN) 2.5 MG tablet Take 1 tablet on Mondays and Thursdays only along with warfarin 4mg  daily 06/24/17  Yes Laqueta Linden, MD  warfarin (COUMADIN) 4 MG tablet Take 4mg  daily except 2.5mg  on Wednesdays and Sundays 12/07/17  Yes Laqueta Linden, MD  metoprolol tartrate (LOPRESSOR) 25 MG tablet Take 1 tablet (25 mg total) by mouth 2 (  two) times daily. 01/19/17 01/19/18  [name], MD     No Known Allergies   Family History  Problem Relation Age of Onset  . Diabetes Mother   . Heart disease Father   . Diabetes Sister   . Diabetes Brother      Social History   Socioeconomic History  . Marital status: Single    Spouse name: Not on file  . Number of children: Not on file  . Years of education: Not on file  . Highest education level: Not on file  Occupational History  . Occupation: Retired    Comment: CNA at a local SNF  Social Needs  .  Financial resource strain: Not on file  . Food insecurity:    Worry: Not on file    Inability: Not on file  . Transportation needs:    Medical: Not on file    Non-medical: Not on file  Tobacco Use  . Smoking status: Never Smoker  . Smokeless tobacco: Never Used  Substance and Sexual Activity  . Alcohol use: No    Alcohol/week: 0.0 standard drinks  . Drug use: No  . Sexual activity: Never  Lifestyle  . Physical activity:    Days per week: Not on file    Minutes per session: Not on file  . Stress: Not on file  Relationships  . Social connections:    Talks on phone: Not on file    Gets together: Not on file    Attends religious service: Not on file    Active member of club or organization: Not on file    Attends meetings of clubs or organizations: Not on file    Relationship status: Not on file  . Intimate partner violence:    Fear of current or ex partner: Not on file    Emotionally abused: Not on file    Physically abused: Not on file    Forced sexual activity: Not on file  Other Topics Concern  . Not on file  Social History Narrative  . Not on file     Review of Systems  Constitutional: Negative.   HENT: Negative.   Eyes: Negative.   Respiratory: Negative.   Cardiovascular: Negative.   Gastrointestinal: Negative.   Endocrine: Negative.   Genitourinary: Negative.   Musculoskeletal: Negative.   Skin: Negative.   Allergic/Immunologic: Negative.   Neurological: Negative.   Hematological: Negative.   Psychiatric/Behavioral: Negative.   All other systems reviewed and are negative.      Objective:    Vitals:   02/22/18 1135  BP: 128/64  Pulse: 82  Resp: 16  Temp: 98.1 F (36.7 C)  TempSrc: Oral  SpO2: 96%      Physical Exam  Constitutional: She appears well-developed and well-nourished. No distress.  Elderly female, alert, no acute distress  HENT:  Head: Normocephalic and atraumatic.  Nose: Nose normal.  Mouth/Throat: Oropharynx is clear and  moist.  Eyes: Pupils are equal, round, and reactive to light. Conjunctivae are normal. Right eye exhibits no discharge. Left eye exhibits no discharge.  Neck: No tracheal deviation present.  Cardiovascular: Normal rate, regular rhythm, normal heart sounds and intact distal pulses. Exam reveals no gallop and no friction rub.  No murmur heard. Pulmonary/Chest: Effort normal and breath sounds normal. No stridor. No respiratory distress.  Abdominal: Soft. Bowel sounds are normal. She exhibits no distension. There is no tenderness.  Musculoskeletal: She exhibits no deformity.  Neurological: She is alert. She exhibits normal muscle tone.  Skin: Skin is warm and dry. Capillary refill takes less than 2 seconds. No rash noted. She is not diaphoretic.  Skin 10 to all extremities and trunk, no rashes, slight dry and ashy skin with scattered excoriations, no abrasions, no erythema or edema  Psychiatric: She has a normal mood and affect. Her behavior is normal.  Nursing note and vitals reviewed.         Assessment & Plan:      ICD-10-CM   1. Pruritic dermatitis L29.9 Cetirizine HCl (ZYRTEC ALLERGY) 10 MG TBDP  2. Medication refill Z76.0 donepezil (ARICEPT) 10 MG tablet    metoprolol tartrate (LOPRESSOR) 25 MG tablet  3. Late onset Alzheimer's disease with behavioral disturbance (HCC) G30.1 QUEtiapine (SEROQUEL) 50 MG tablet   F02.81   4. Gastroesophageal reflux disease, esophagitis presence not specified K21.9 omeprazole (PRILOSEC) 40 MG capsule    Patient with diffuse itchy skin, suspect it may be related to dry skin with weather changes or may have some mild atopic dermatitis, advised hydration with ointments or Vaseline, other examples given were Aquaphor, Eucerin, Cetaphil, CeraVe.  Patient and her daughter encouraged to try Zyrtec once at night to see if it helps with the pruritic symptoms, there is any severe itching which results in breaking of skin or uncontrolled itching at night they are  instructed to hold Zyrtec and try the hydroxyzine 10 mg very sparingly because of its potential sedating effects, but low-dose was given and precautions were reviewed with the patient and with her daughter.  Patient was encouraged to hold recall medication, suggested a trial of Pepcid to see if this is a effective replacement and if not to start omeprazole for indigestion acid reflux symptoms.  Other meds were refilled by LPN for chronic conditions which were not reviewed today - pt will have routine f/up with PCP   Danelle Berry, PA-C 02/22/18 11:53 AM

## 2018-03-01 ENCOUNTER — Ambulatory Visit: Payer: Medicare Other | Admitting: Physician Assistant

## 2018-03-08 ENCOUNTER — Ambulatory Visit (INDEPENDENT_AMBULATORY_CARE_PROVIDER_SITE_OTHER): Payer: Medicare Other | Admitting: *Deleted

## 2018-03-08 DIAGNOSIS — I749 Embolism and thrombosis of unspecified artery: Secondary | ICD-10-CM

## 2018-03-08 DIAGNOSIS — I4891 Unspecified atrial fibrillation: Secondary | ICD-10-CM | POA: Diagnosis not present

## 2018-03-08 DIAGNOSIS — Z5181 Encounter for therapeutic drug level monitoring: Secondary | ICD-10-CM

## 2018-03-08 LAB — POCT INR: INR: 2.8 (ref 2.0–3.0)

## 2018-03-08 MED ORDER — WARFARIN SODIUM 2.5 MG PO TABS: ORAL_TABLET | ORAL | 1 refills | Status: DC

## 2018-03-08 NOTE — Patient Instructions (Signed)
Continue coumadin 4mg  daily except 2.5mg  on Wednesdays Recheck in 4 weeks

## 2018-03-24 ENCOUNTER — Ambulatory Visit: Payer: Medicare Other | Admitting: Family Medicine

## 2018-03-29 ENCOUNTER — Ambulatory Visit: Payer: Medicare Other | Admitting: Family Medicine

## 2018-04-05 ENCOUNTER — Encounter: Payer: Self-pay | Admitting: Family Medicine

## 2018-04-05 ENCOUNTER — Other Ambulatory Visit: Payer: Self-pay

## 2018-04-05 ENCOUNTER — Ambulatory Visit (INDEPENDENT_AMBULATORY_CARE_PROVIDER_SITE_OTHER): Payer: Medicare Other | Admitting: Family Medicine

## 2018-04-05 ENCOUNTER — Ambulatory Visit (INDEPENDENT_AMBULATORY_CARE_PROVIDER_SITE_OTHER): Payer: Medicare Other | Admitting: *Deleted

## 2018-04-05 VITALS — BP 130/64 | HR 80 | Temp 98.1°F | Resp 18

## 2018-04-05 DIAGNOSIS — L282 Other prurigo: Secondary | ICD-10-CM | POA: Diagnosis not present

## 2018-04-05 DIAGNOSIS — F0281 Dementia in other diseases classified elsewhere with behavioral disturbance: Secondary | ICD-10-CM | POA: Diagnosis not present

## 2018-04-05 DIAGNOSIS — I749 Embolism and thrombosis of unspecified artery: Secondary | ICD-10-CM

## 2018-04-05 DIAGNOSIS — I4891 Unspecified atrial fibrillation: Secondary | ICD-10-CM | POA: Diagnosis not present

## 2018-04-05 DIAGNOSIS — F02818 Dementia in other diseases classified elsewhere, unspecified severity, with other behavioral disturbance: Secondary | ICD-10-CM

## 2018-04-05 DIAGNOSIS — G301 Alzheimer's disease with late onset: Secondary | ICD-10-CM

## 2018-04-05 DIAGNOSIS — Z5181 Encounter for therapeutic drug level monitoring: Secondary | ICD-10-CM | POA: Diagnosis not present

## 2018-04-05 LAB — POCT INR: INR: 3.3 — AB (ref 2.0–3.0)

## 2018-04-05 MED ORDER — METHYLPREDNISOLONE ACETATE 40 MG/ML IJ SUSP
40.0000 mg | Freq: Once | INTRAMUSCULAR | Status: AC
  Administered 2018-04-05: 40 mg via INTRAMUSCULAR

## 2018-04-05 MED ORDER — PREDNISONE 10 MG PO TABS: ORAL_TABLET | ORAL | 0 refills | Status: DC

## 2018-04-05 MED ORDER — LORAZEPAM 1 MG PO TABS: ORAL_TABLET | ORAL | 5 refills | Status: DC

## 2018-04-05 MED ORDER — FAMOTIDINE 20 MG PO TABS: 20.0000 mg | ORAL_TABLET | Freq: Two times a day (BID) | ORAL | 6 refills | Status: DC

## 2018-04-05 NOTE — Patient Instructions (Signed)
Hold coumadin tonight then resume 4mg  daily except 2.5mg  on Wednesdays Recheck in 4 weeks

## 2018-04-05 NOTE — Progress Notes (Signed)
   Subjective:    Patient ID: [name], female    DOB: [birth year], 84 y.o.   MRN: [id number]  Patient presents for Rash (skin irritation- no improvement with cortisone cream, atarax )  Pt here with daughter, she has dementia. Seen on 10/23 with itching all over, no change insoap, detergent, medication,no infection seen. Given atarax after lotions, cortisone did not help. No improvement with atarax either. She has scratched all over now has rash on face, neck, arms, legs, anywhere she scratches. No one else in family with rash. No fever, no URI symptoms.   Review Of Systems: UNABLE TO OBTAIN  GEN- denies fatigue, fever, weight loss,weakness, recent illness HEENT- denies eye drainage, change in vision, nasal discharge, CVS- denies chest pain, palpitations RESP- denies SOB, cough, wheeze ABD- denies N/V, change in stools, abd pain GU- denies dysuria, hematuria, dribbling, incontinence MSK- denies joint pain, muscle aches, injury Neuro- denies headache, dizziness, syncope, seizure activity       Objective:    BP 130/64   Pulse 80   Temp 98.1 F (36.7 C) (Oral)   Resp 18   SpO2 97%  GEN- NAD, alert and oriented x person, sitting in wheelchair, chronically ill appearing  HEENT- PERRL, EOMI, non injected sclera, pink conjunctiva, MMM, oropharynx clear Neck- Supple, CVS- RRR, no murmur RESP-CTAB ABD-NABS,soft,NT,ND EXT- No edema Skin- erythematous raised plaque like lesions on foreahd, neck, scattered on arm, multiple excoriations and scabs in these areas as well, dry skin all over, no pustular or vesicular lesions Pulses- Radial 2+        Assessment & Plan:      Problem List Items Addressed This Visit      Unprioritized   Dementia (HCC)   Relevant Medications   LORazepam (ATIVAN) 1 MG tablet    Other Visit Diagnoses    Pruritic rash    -  Primary   This may be rash from scratch itch cycle, she seems to claw at skin, no change in meds or other known allergic  reaction, does not appear to be a mite. Check labs for renal and hepatic toxicity and infection Given Depo Medrol in office, prednisone taper, pepcid BID, unable to swallow prilosec capsiles D/C atarax as not helpful If still no improvement will do punch biopsy   Relevant Medications   methylPREDNISolone acetate (DEPO-MEDROL) injection 40 mg (Completed)   Other Relevant Orders   CBC with Differential/Platelet   Comprehensive metabolic panel      Note: This dictation was prepared with Dragon dictation along with smaller phrase technology. Any transcriptional errors that result from this process are unintentional.

## 2018-04-05 NOTE — Patient Instructions (Addendum)
Take the steroids- as directed Steroid shot given Take the pepcid 20mg  twice a day for stomach and itching  Stop the atarax F/U 2 months - Morning appointment

## 2018-04-06 LAB — COMPREHENSIVE METABOLIC PANEL
AG RATIO: 1.1 (calc) (ref 1.0–2.5)
ALKALINE PHOSPHATASE (APISO): 67 U/L (ref 33–130)
ALT: 5 U/L — ABNORMAL LOW (ref 6–29)
AST: 13 U/L (ref 10–35)
Albumin: 3.5 g/dL — ABNORMAL LOW (ref 3.6–5.1)
BILIRUBIN TOTAL: 0.4 mg/dL (ref 0.2–1.2)
BUN/Creatinine Ratio: 22 (calc) (ref 6–22)
BUN: 36 mg/dL — AB (ref 7–25)
CALCIUM: 9.2 mg/dL (ref 8.6–10.4)
CHLORIDE: 109 mmol/L (ref 98–110)
CO2: 21 mmol/L (ref 20–32)
Creat: 1.64 mg/dL — ABNORMAL HIGH (ref 0.60–0.88)
Globulin: 3.1 g/dL (calc) (ref 1.9–3.7)
Glucose, Bld: 88 mg/dL (ref 65–99)
Potassium: 4.1 mmol/L (ref 3.5–5.3)
Sodium: 142 mmol/L (ref 135–146)
Total Protein: 6.6 g/dL (ref 6.1–8.1)

## 2018-04-06 LAB — CBC WITH DIFFERENTIAL/PLATELET
BASOS ABS: 36 {cells}/uL (ref 0–200)
BASOS PCT: 0.4 %
EOS PCT: 27.3 %
Eosinophils Absolute: 2484 cells/uL — ABNORMAL HIGH (ref 15–500)
HEMATOCRIT: 31.3 % — AB (ref 35.0–45.0)
HEMOGLOBIN: 10.4 g/dL — AB (ref 11.7–15.5)
Lymphs Abs: 2503 cells/uL (ref 850–3900)
MCH: 32 pg (ref 27.0–33.0)
MCHC: 33.2 g/dL (ref 32.0–36.0)
MCV: 96.3 fL (ref 80.0–100.0)
MONOS PCT: 6.3 %
MPV: 11.1 fL (ref 7.5–12.5)
NEUTROS ABS: 3504 {cells}/uL (ref 1500–7800)
Neutrophils Relative %: 38.5 %
Platelets: 228 10*3/uL (ref 140–400)
RBC: 3.25 10*6/uL — AB (ref 3.80–5.10)
RDW: 12.8 % (ref 11.0–15.0)
Total Lymphocyte: 27.5 %
WBC mixed population: 573 cells/uL (ref 200–950)
WBC: 9.1 10*3/uL (ref 3.8–10.8)

## 2018-04-10 ENCOUNTER — Ambulatory Visit: Payer: Medicare Other | Admitting: Family Medicine

## 2018-04-25 ENCOUNTER — Encounter (HOSPITAL_COMMUNITY): Payer: Self-pay | Admitting: Emergency Medicine

## 2018-04-25 ENCOUNTER — Emergency Department (HOSPITAL_COMMUNITY)
Admission: EM | Admit: 2018-04-25 | Discharge: 2018-04-25 | Disposition: A | Discharge status: Home / Self Care | Payer: Medicare Other | Attending: Emergency Medicine | Admitting: Emergency Medicine

## 2018-04-25 ENCOUNTER — Emergency Department (HOSPITAL_COMMUNITY): Payer: Medicare Other

## 2018-04-25 DIAGNOSIS — L03116 Cellulitis of left lower limb: Secondary | ICD-10-CM | POA: Insufficient documentation

## 2018-04-25 DIAGNOSIS — S91012A Laceration without foreign body, left ankle, initial encounter: Secondary | ICD-10-CM | POA: Insufficient documentation

## 2018-04-25 DIAGNOSIS — M25572 Pain in left ankle and joints of left foot: Secondary | ICD-10-CM | POA: Diagnosis not present

## 2018-04-25 DIAGNOSIS — M7989 Other specified soft tissue disorders: Secondary | ICD-10-CM | POA: Diagnosis not present

## 2018-04-25 DIAGNOSIS — M79662 Pain in left lower leg: Secondary | ICD-10-CM | POA: Diagnosis not present

## 2018-04-25 DIAGNOSIS — S99912A Unspecified injury of left ankle, initial encounter: Secondary | ICD-10-CM | POA: Diagnosis present

## 2018-04-25 DIAGNOSIS — M79672 Pain in left foot: Secondary | ICD-10-CM | POA: Diagnosis not present

## 2018-04-25 DIAGNOSIS — I129 Hypertensive chronic kidney disease with stage 1 through stage 4 chronic kidney disease, or unspecified chronic kidney disease: Secondary | ICD-10-CM | POA: Insufficient documentation

## 2018-04-25 DIAGNOSIS — X58XXXA Exposure to other specified factors, initial encounter: Secondary | ICD-10-CM | POA: Insufficient documentation

## 2018-04-25 DIAGNOSIS — Y939 Activity, unspecified: Secondary | ICD-10-CM | POA: Insufficient documentation

## 2018-04-25 DIAGNOSIS — N183 Chronic kidney disease, stage 3 (moderate): Secondary | ICD-10-CM | POA: Diagnosis not present

## 2018-04-25 DIAGNOSIS — Y999 Unspecified external cause status: Secondary | ICD-10-CM | POA: Diagnosis not present

## 2018-04-25 DIAGNOSIS — Y929 Unspecified place or not applicable: Secondary | ICD-10-CM | POA: Diagnosis not present

## 2018-04-25 DIAGNOSIS — Z7901 Long term (current) use of anticoagulants: Secondary | ICD-10-CM | POA: Diagnosis not present

## 2018-04-25 LAB — CBC
HEMATOCRIT: 29.3 % — AB (ref 36.0–46.0)
Hemoglobin: 9 g/dL — ABNORMAL LOW (ref 12.0–15.0)
MCH: 32.4 pg (ref 26.0–34.0)
MCHC: 30.7 g/dL (ref 30.0–36.0)
MCV: 105.4 fL — AB (ref 80.0–100.0)
Platelets: 257 10*3/uL (ref 150–400)
RBC: 2.78 MIL/uL — ABNORMAL LOW (ref 3.87–5.11)
RDW: 14.2 % (ref 11.5–15.5)
WBC: 16.8 10*3/uL — ABNORMAL HIGH (ref 4.0–10.5)
nRBC: 0 % (ref 0.0–0.2)

## 2018-04-25 LAB — COMPREHENSIVE METABOLIC PANEL
ALT: 10 U/L (ref 0–44)
ANION GAP: 15 (ref 5–15)
AST: 28 U/L (ref 15–41)
Albumin: 2.9 g/dL — ABNORMAL LOW (ref 3.5–5.0)
Alkaline Phosphatase: 64 U/L (ref 38–126)
BILIRUBIN TOTAL: 1 mg/dL (ref 0.3–1.2)
BUN: 36 mg/dL — ABNORMAL HIGH (ref 8–23)
CO2: 15 mmol/L — ABNORMAL LOW (ref 22–32)
Calcium: 8.6 mg/dL — ABNORMAL LOW (ref 8.9–10.3)
Chloride: 111 mmol/L (ref 98–111)
Creatinine, Ser: 1.6 mg/dL — ABNORMAL HIGH (ref 0.44–1.00)
GFR, EST AFRICAN AMERICAN: 33 mL/min — AB (ref >60)
GFR, EST NON AFRICAN AMERICAN: 29 mL/min — AB (ref >60)
Glucose, Bld: 113 mg/dL — ABNORMAL HIGH (ref 70–99)
POTASSIUM: 5.2 mmol/L — AB (ref 3.5–5.1)
Sodium: 141 mmol/L (ref 135–145)
TOTAL PROTEIN: 6.7 g/dL (ref 6.5–8.1)

## 2018-04-25 LAB — PROTIME-INR
INR: 4.3 — AB
Prothrombin Time: 40.6 seconds — ABNORMAL HIGH (ref 11.4–15.2)

## 2018-04-25 LAB — C-REACTIVE PROTEIN: CRP: 14.9 mg/dL — AB (ref <1.0)

## 2018-04-25 LAB — SEDIMENTATION RATE: SED RATE: 86 mm/h — AB (ref 0–22)

## 2018-04-25 MED ORDER — SULFAMETHOXAZOLE-TRIMETHOPRIM 800-160 MG PO TABS: 1.0000 | ORAL_TABLET | Freq: Two times a day (BID) | ORAL | 0 refills | Status: AC

## 2018-04-25 MED ORDER — SULFAMETHOXAZOLE-TRIMETHOPRIM 800-160 MG PO TABS
1.0000 | ORAL_TABLET | Freq: Once | ORAL | Status: AC
  Administered 2018-04-25: 1 via ORAL
  Filled 2018-04-25: qty 1

## 2018-04-25 MED ORDER — CEPHALEXIN 250 MG PO CAPS
250.0000 mg | ORAL_CAPSULE | Freq: Once | ORAL | Status: AC
  Administered 2018-04-25: 250 mg via ORAL
  Filled 2018-04-25: qty 1

## 2018-04-25 MED ORDER — CEPHALEXIN 250 MG PO CAPS: 250.0000 mg | ORAL_CAPSULE | Freq: Three times a day (TID) | ORAL | 0 refills | Status: AC

## 2018-04-25 NOTE — ED Provider Notes (Addendum)
[facility] Emergency Department Provider Note MRN:  [id number]  Arrival date & time: 04/25/18     Chief Complaint   Leg Swelling   History of Present Illness   [name] is a 84 y.o. year-old female with a history of CKD, hypertension, A. fib presenting to the ED with chief complaint of ankle swelling.  2 days of swelling to the left ankle, today noticed increased redness, tenderness, also noticed a bleeding cut on the ankle.  No witnessed trauma.  Denies fever, no chest pain or shortness of breath, no abdominal pain.  Symptoms are constant, no exacerbating or relieving factors.  Review of Systems  A complete 10 system review of systems was obtained and all systems are negative except as noted in the HPI and PMH.   Patient's Health History    Past Medical History:  Diagnosis Date  . Anemia    Hemoglobin of 11.6 in 01/2010; 12.1 in 08/2010  . Atrial fibrillation (HCC)    chronic requiring anticoagulation  . Chronic anticoagulation   . Chronic kidney disease    Stage III; creatinine of 1.7 in 04/2007 and 1.5 2009; 1.7 in 04/2010  . Hyperlipidemia    Total cholesterol of 226, triglycerides of 90, HDL of 49 and LDL of 159 in 01/2010  . Hypertension     Past Surgical History:  Procedure Laterality Date  . APPENDECTOMY    . BUNIONECTOMY  1995  . PARTIAL HYSTERECTOMY      Family History  Problem Relation Age of Onset  . Diabetes Mother   . Heart disease Father   . Diabetes Sister   . Diabetes Brother     Social History   Socioeconomic History  . Marital status: Single    Spouse name: Not on file  . Number of children: Not on file  . Years of education: Not on file  . Highest education level: Not on file  Occupational History  . Occupation: Retired    Comment: CNA at a local SNF  Social Needs  . Financial resource strain: Not on file  . Food insecurity:    Worry: Not on file    Inability: Not on file  . Transportation needs:    Medical: Not  on file    Non-medical: Not on file  Tobacco Use  . Smoking status: Never Smoker  . Smokeless tobacco: Never Used  Substance and Sexual Activity  . Alcohol use: No    Alcohol/week: 0.0 standard drinks  . Drug use: No  . Sexual activity: Never  Lifestyle  . Physical activity:    Days per week: Not on file    Minutes per session: Not on file  . Stress: Not on file  Relationships  . Social connections:    Talks on phone: Not on file    Gets together: Not on file    Attends religious service: Not on file    Active member of club or organization: Not on file    Attends meetings of clubs or organizations: Not on file    Relationship status: Not on file  . Intimate partner violence:    Fear of current or ex partner: Not on file    Emotionally abused: Not on file    Physically abused: Not on file    Forced sexual activity: Not on file  Other Topics Concern  . Not on file  Social History Narrative  . Not on file     Physical Exam  Vital  Signs and Nursing Notes reviewed Vitals:   04/25/18 2230 04/25/18 2245  BP: 127/71   Pulse:  66  Resp:    Temp:    SpO2:  100%    CONSTITUTIONAL: Well-appearing, NAD NEURO:  Alert and oriented x 3, no focal deficits EYES:  eyes equal and reactive ENT/NECK:  no LAD, no JVD CARDIO: Regular rate, well-perfused, normal S1 and S2 PULM:  CTAB no wheezing or rhonchi GI/GU:  normal bowel sounds, non-distended, non-tender MSK/SPINE:  No gross deformities, 2+ pitting edema to the left ankle, most notable at the lateral malleolus with moderate tenderness to palpation; preserved range of motion of the ankle. SKIN:  no rash, small bleeding laceration to the medial aspect of the left ankle with surrounding bruising PSYCH:  Appropriate speech and behavior  Diagnostic and Interventional Summary    Labs Reviewed  CBC - Abnormal; Notable for the following components:      Result Value   WBC 16.8 (*)    RBC 2.78 (*)    Hemoglobin 9.0 (*)    HCT  29.3 (*)    MCV 105.4 (*)    All other components within normal limits  COMPREHENSIVE METABOLIC PANEL - Abnormal; Notable for the following components:   Potassium 5.2 (*)    CO2 15 (*)    Glucose, Bld 113 (*)    BUN 36 (*)    Creatinine, Ser 1.60 (*)    Calcium 8.6 (*)    Albumin 2.9 (*)    GFR calc non Af Amer 29 (*)    GFR calc Af Amer 33 (*)    All other components within normal limits  SEDIMENTATION RATE - Abnormal; Notable for the following components:   Sed Rate 86 (*)    All other components within normal limits  C-REACTIVE PROTEIN - Abnormal; Notable for the following components:   CRP 14.9 (*)    All other components within normal limits  PROTIME-INR - Abnormal; Notable for the following components:   Prothrombin Time 40.6 (*)    INR 4.30 (*)    All other components within normal limits    DG Ankle Complete Left  Final Result    DG Foot Complete Left  Final Result    DG Tibia/Fibula Left  Final Result      Medications  cephALEXin (KEFLEX) capsule 250 mg (250 mg Oral Given 04/25/18 2238)  sulfamethoxazole-trimethoprim (BACTRIM DS,SEPTRA DS) 800-160 MG per tablet 1 tablet (1 tablet Oral Given 04/25/18 2238)     Procedures  EMERGENCY DEPARTMENT US SOFT TISSUE INTERPRETATION "Study: Limited Soft Tissue Ultrasound"  INDICATIONS: Pain and Soft tissue infection Multiple views of the body part were obtained in real-time with a multi-frequency linear probe  PERFORMED BY: Myself IMAGES ARCHIVED?: Yes SIDE:Left BODY PART:Lower extremity INTERPRETATION:  Cellulitis present and No evidence of left ankle joint effusion   Critical Care  ED Course and Medical Decision Making  I have reviewed the triage vital signs and the nursing notes.  Pertinent labs & imaging results that were available during my care of the patient were reviewed by me and considered in my medical decision making (see below for details).  Question of on witnessed trauma or trauma that the  patient does not remember.  Normal range of motion of the ankle, little to no concern for septic joint.  Possible cellulitis, inconsistent with DVT given that the swelling and tenderness is localized only to the ankle.  X-rays pending.  X-rays unremarkable, labs reveal elevated inflammatory markers,  leukocytosis.  Bedside ultrasound reveals no joint effusion, patient has good passive range of motion of the ankle without significant pain.  Little to no concern for septic joint, favoring cellulitis.  No systemic symptoms, appropriate for outpatient management.  Given first dose of antibiotics here in the ED. Discussed laboratory values with the family, most notable for signs of acidosis on the CMP.  Per family patient is acting her normal self, feels well, patient and family wish to go home rather than be admitted.  Stressed the importance of taking the antibiotics, plenty of fluids at home, strict return precautions.  Also advised to skip her next dose of Coumadin given the PT level.  After the discussed management above, the patient was determined to be safe for discharge.  The patient was in agreement with this plan and all questions regarding their care were answered.  ED return precautions were discussed and the patient will return to the ED with any significant worsening of condition.  Elmer SowMichael M. Pilar PlateBero, MD [facility] [email or web address]  Final Clinical Impressions(s) / ED Diagnoses     ICD-10-CM   1. Cellulitis of left lower extremity L03.116     ED Discharge Orders    None         Sabas SousBero, Denny Lave M, MD 04/25/18 2255    Sabas SousBero, Ninfa Giannelli M, MD 04/25/18 [phone]

## 2018-04-25 NOTE — ED Triage Notes (Signed)
Daughter reports that patient has left ankle/foot and lower leg swelling for 2 days. Today noticed redness on medial side of ankle. Denies falls.

## 2018-04-25 NOTE — Discharge Instructions (Addendum)
You were evaluated in the Emergency Department and after careful evaluation, we did not find any emergent condition requiring admission or further testing in the hospital.  Your symptoms today seem to be due to infection of the skin.  Please take the antibiotics as directed and follow-up closely with your primary care provider.  It is important that you return to the emergency department with any worsening of condition such as fever, increased redness, confusion.  Your INR level was mildly elevated.  Skip your next dose of Coumadin and then continue your normal dosing and frequency.  Please return to the Emergency Department if you experience any worsening of your condition.  We encourage you to follow up with a primary care provider.  Thank you for allowing us to be a part of your care.

## 2018-04-28 ENCOUNTER — Ambulatory Visit (INDEPENDENT_AMBULATORY_CARE_PROVIDER_SITE_OTHER): Payer: Medicare Other | Admitting: Family Medicine

## 2018-04-28 ENCOUNTER — Encounter: Payer: Self-pay | Admitting: Family Medicine

## 2018-04-28 ENCOUNTER — Ambulatory Visit (HOSPITAL_COMMUNITY): Payer: Medicare Other | Attending: Family Medicine | Admitting: Physical Therapy

## 2018-04-28 ENCOUNTER — Encounter (HOSPITAL_COMMUNITY): Payer: Self-pay | Admitting: Physical Therapy

## 2018-04-28 ENCOUNTER — Other Ambulatory Visit: Payer: Self-pay

## 2018-04-28 VITALS — BP 122/82 | HR 82 | Temp 98.7°F | Resp 14

## 2018-04-28 DIAGNOSIS — I739 Peripheral vascular disease, unspecified: Secondary | ICD-10-CM

## 2018-04-28 DIAGNOSIS — L97329 Non-pressure chronic ulcer of left ankle with unspecified severity: Secondary | ICD-10-CM

## 2018-04-28 DIAGNOSIS — L03116 Cellulitis of left lower limb: Secondary | ICD-10-CM

## 2018-04-28 DIAGNOSIS — Z7901 Long term (current) use of anticoagulants: Secondary | ICD-10-CM

## 2018-04-28 DIAGNOSIS — S91002S Unspecified open wound, left ankle, sequela: Secondary | ICD-10-CM | POA: Diagnosis not present

## 2018-04-28 DIAGNOSIS — R601 Generalized edema: Secondary | ICD-10-CM | POA: Insufficient documentation

## 2018-04-28 DIAGNOSIS — I749 Embolism and thrombosis of unspecified artery: Secondary | ICD-10-CM | POA: Diagnosis not present

## 2018-04-28 LAB — PT WITH INR/FINGERSTICK
INR, fingerstick: 3.6 ratio — ABNORMAL HIGH
PT, fingerstick: 43.1 s — ABNORMAL HIGH (ref 10.5–13.1)

## 2018-04-28 NOTE — Therapy (Signed)
[facility] [address] [place], [place], [address] Phone: [phone]   Fax:  [phone]  Physical Therapy Evaluation  Patient Details  Name: [name] MRN: [id number] Date of Birth: 1928-05-05 Referring Provider (PT): RigginsKawnta [name]   Encounter Date: 04/28/2018  PT End of Session - 04/28/18 1557    Visit Number  1    Number of Visits  12    Date for PT Re-Evaluation  06/09/18    Authorization Type  medicare    PT Start Time  1440    PT Stop Time  1525    PT Time Calculation (min)  45 min    Activity Tolerance  Patient tolerated treatment well    Behavior During Therapy  [facility] for tasks assessed/performed       Past Medical History:  Diagnosis Date  . Anemia    Hemoglobin of 11.6 in 01/2010; 12.1 in 08/2010  . Atrial fibrillation (HCC)    chronic requiring anticoagulation  . Chronic anticoagulation   . Chronic kidney disease    Stage III; creatinine of 1.7 in 04/2007 and 1.5 2009; 1.7 in 04/2010  . Hyperlipidemia    Total cholesterol of 226, triglycerides of 90, HDL of 49 and LDL of 159 in 01/2010  . Hypertension     Past Surgical History:  Procedure Laterality Date  . APPENDECTOMY    . BUNIONECTOMY  1995  . PARTIAL HYSTERECTOMY      There were no vitals filed for this visit.       [facility] PT Assessment - 04/28/18 0001      Assessment   Medical Diagnosis  nonhealing medial lt ankle wound     Referring Provider (PT)  Isabel PlentyKawnta Searchlight    Onset Date/Surgical Date  04/22/18    Prior Therapy  ER/ MD       Precautions   Precautions  --   cellulitis     Restrictions   Weight Bearing Restrictions  No      Balance Screen   Has the patient fallen in the past 6 months  No    Has the patient had a decrease in activity level because of a fear of falling?   Yes    Is the patient reluctant to leave their home because of a fear of falling?   No      Home Environment   Living Environment  Private residence     Additional Comments  Pt lives with daugher.  Needs assist for transfers and ambulation.  Community uses wheelchair.                 Objective measurements completed on examination: See above findings.     Wound Therapy - 04/28/18 1541    Subjective  Pt daughter states that last week she noticed that her mothers foot and ankle was swollen.  On Christmas Eve there was a small blister that popped up on the inside of her left ankle.  She took her to the ER and was diagnosed with cellulitis.  The blister popped on Christmas and what was very a small area grew significantly in just two days therefore she took her mother to her MD who has now referred her to therapy.     Patient and Family Stated Goals  for wound to heal.     Date of Onset  04/22/18    Prior Treatments  self care, antibiotic at ER    Pain Scale  0-10  Pain Score  0-No pain   Only verbalize pain when therapist was cleansing wound.   Evaluation and Treatment Procedures Explained to Patient/Family  Yes    Evaluation and Treatment Procedures  agreed to    Wound Properties Date First Assessed: 04/28/18 Time First Assessed: 1440 Wound Type: Other (Comment) Location: Ankle Location Orientation: Left;Medial Wound Description (Comments): dark red not granulation, hypertrophic  Present on Admission: Yes   Dressing Type  Gauze (Comment)    Dressing Changed  Changed    Dressing Status  Old drainage    Dressing Change Frequency  PRN    Site / Wound Assessment  Dusky    % Wound base Other/Granulation Tissue (Comment)  --   total wound is more dark, dusky red not healthy granulating   Peri-wound Assessment  Edema    Wound Length (cm)  4 cm    Wound Width (cm)  6 cm    Wound Depth (cm)  --   wound bed is hypergranulated rising approximately .5 cm    Wound Surface Area (cm^2)  24 cm^2    Margins  Attached edges (approximated)    Drainage Amount  Moderate    Drainage Description  Serosanguineous    Treatment   Cleansed;Debridement (Selective)    Selective Debridement - Location  dead skin around wound     Selective Debridement - Tools Used  Forceps    Selective Debridement - Tissue Removed  --   dead skin   Wound Therapy - Clinical Statement  [name] is an 84 yo with cellulitis and a progressive wound on the inside of her Lt ankle.  At this time the wound has over doubled in size in two days.  She is being referred to skilled physical therapy to create an environment in which the wound can heal.      Wound Therapy - Functional Problem List  wound bleeds very easily so it is difficult to don socks.     Factors Delaying/Impairing Wound Healing  Altered sensation;Infection - systemic/local;Immobility;Multiple medical problems;Polypharmacy    Hydrotherapy Plan  Debridement;Dressing change;Patient/family education    Wound Therapy - Frequency  2X / week   for 6 weeks    Wound Therapy - Current Recommendations  PT    Wound Plan  Cleanse, debride and moisturize LE and wound followed by a compression dressing     Dressing   silver hydrofiber to wound bed followed by Profore.               PT Education - 04/28/18 1556    Education Details  keep dressing dry, take dressing off if it becomes painful    Person(s) Educated  Child(ren)    Methods  Explanation    Comprehension  Verbalized understanding       PT Short Term Goals - 04/28/18 1602      PT SHORT TERM GOAL #1   Title  PT daughter to be able to verbalize the sign and symptoms of cellulitis and the need to get medical attention immediatelyl.     Time  1    Period  Weeks    Status  New    Target Date  05/05/18      PT SHORT TERM GOAL #2   Title  Wound to decrease in size by 1.5 cm LEngth and width and no longer by hypergranulated     Time  3    Period  Weeks    Status  New    Target  Date  05/19/18        PT Long Term Goals - 04/28/18 1605      PT LONG TERM GOAL #1   Title  PT wound to be 100% granulated with healthy red  tissue     Time  6    Period  Weeks    Status  New    Target Date  06/09/18      PT LONG TERM GOAL #2   Title  PT wound size to be decreased to no greater than 2x2 cm to allow daughter to feel confident at self care of wound      Time  6    Period  Weeks    Status  New      PT LONG TERM GOAL #3   Title  PT to be able to don a sock without causing wound to bleed     Time  6    Period  Weeks    Status  New             Plan - 04/28/18 1557    Clinical Impression Statement  see above     Clinical Presentation  Evolving    Clinical Decision Making  Low    Rehab Potential  Good    PT Frequency  2x / week    PT Duration  6 weeks    PT Treatment/Interventions  ADLs/Self Care Home Management;Manual techniques   debridement and compression dressing    PT Next Visit Plan  PT to be seen 2x a week for 6 weeks or until wound is healed     Consulted and Agree with Plan of Care  Patient;Family member/caregiver       Patient will benefit from skilled therapeutic intervention in order to improve the following deficits and impairments:  Decreased skin integrity, Increased edema, Pain  Visit Diagnosis: Ankle wound, left, sequela  Generalized edema     Problem List Patient Active Problem List   Diagnosis Date Noted  . Impaired gait and mobility 05/24/2017  . Weakness 04/28/2016  . Loss of weight 10/22/2013  . Encounter for therapeutic drug monitoring 06/04/2013  . Dementia (HCC) 05/18/2013  . UTI (urinary tract infection) 10/05/2012  . Chronic insomnia 06/30/2012  . Headache(784.0) 03/03/2012  . Eosinophilia 11/27/2011  . Peripheral vascular disease (HCC) 09/29/2011  . Chronic kidney disease   . Tobacco abuse, in remission   . Chronic anticoagulation   . 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 Hyperlipidemia 01/01/2010  . Anemia 04/09/2009  . Hypertension 04/09/2009  . ATRIAL FIBRILLATION, CHRONIC 04/09/2009    Virgina Organynthia Russell, PT  CLT [phone] 04/28/2018, 4:10 PM   [facility] [address] [place], [place], [address] Phone: [phone]   Fax:  [phone]  Name: [name] MRN: [id number] Date of Birth: 1928/09/28

## 2018-04-28 NOTE — Progress Notes (Signed)
   Subjective:    Patient ID: [name], female    DOB: February 13, 1929, 84 y.o.   MRN: [id number]  Patient presents for ER F/U (cellulitis to ankle)     Left ankle started swelling a few weeks ago. Seen in ED on 12/24, had xyra done which was benign, she had redness, also had bleeding cut on left ankle per notes  Had bedside US that was negative Labs Done- CRP was elevated at 14.9 / ESR 86, WBC 16.8, Hb was  9  INR was high at  4.3  PT 40.6 - Held coumadin for 1 day   Prescribed Bactrim and Keflex, she had 1 dose at ER on 12/24, no medication on christmas day, she started antibiotics on 12/26 when the pharmacy opened , also using neosporin Yesterday daughter noticed a blister on inner left ankle, this morning it burst, states look like the tissue is coming out and its bleeding  No fever, no chills, no change in mentation     INR today is 3.6  Note she had been seen for generalized pruritis a few weeks before    Review Of Systems: Unable to obtain from patient   GEN- denies fatigue, fever, weight loss,weakness, recent illness HEENT- denies eye drainage, change in vision, nasal discharge, CVS- denies chest pain, palpitations RESP- denies SOB, cough, wheeze ABD- denies N/V, change in stools, abd pain GU- denies dysuria, hematuria, dribbling, incontinence MSK- denies joint pain, muscle aches, injury Neuro- denies headache, dizziness, syncope, seizure activity       Objective:    BP 122/82   Pulse 82   Temp 98.7 F (37.1 C) (Oral)   Resp 14   SpO2 96%  GEN- NAD, alert and oriented x person HEENT- PERRL, EOMI, non injected sclera, pink conjunctiva, MMM, oropharynx clear Neck- Supple, no LAD CVS- RRR, no murmur RESP-CTAB Ext- Left foot golf ball size ulceration, mild bleeding, erythema around ankle bilat to mid foot, swelling to mid foot and 2-3 incches above ankle, TTP  Pulse- decreased         Assessment & Plan:      Problem List Items Addressed This Visit      Unprioritized   Chronic anticoagulation - Primary    Hold cuomadin for 2 days, then resume No abnormal bleeding except from wound she is oozing blood      Relevant Orders   Protime-INR   Peripheral vascular disease ([place])   Relevant Orders   Ambulatory referral to Wound Clinic    Other Visit Diagnoses    Cellulitis of left foot       Cellulitis with ulceration, bleeding from supratherapuetic coumadin, has exposed tissue, will send to wound clinic urgently today for evaluation, continue antibiotics    Relevant Orders   CBC with Differential/Platelet   Protime-INR   Sedimentation Rate   Ambulatory referral to Wound Clinic   Lower limb ulcer, ankle, left, with unspecified severity [facility])       Relevant Orders   Ambulatory referral to Wound Clinic      Note: This dictation was prepared with Dragon dictation along with smaller phrase technology. Any transcriptional errors that result from this process are unintentional.

## 2018-04-28 NOTE — Assessment & Plan Note (Addendum)
Hold cuomadin for 2 days, then resume No abnormal bleeding except from wound she is oozing blood

## 2018-04-28 NOTE — Patient Instructions (Addendum)
Do not take coumadin Today or Saturday Restart Sunday  [address] [employer] [address] [employer]

## 2018-04-29 LAB — CBC WITH DIFFERENTIAL/PLATELET
Absolute Monocytes: 680 cells/uL (ref 200–950)
Basophils Absolute: 34 cells/uL (ref 0–200)
Basophils Relative: 0.4 %
Eosinophils Absolute: 420 cells/uL (ref 15–500)
Eosinophils Relative: 5 %
HCT: 25.2 % — ABNORMAL LOW (ref 35.0–45.0)
Hemoglobin: 8.6 g/dL — ABNORMAL LOW (ref 11.7–15.5)
Lymphs Abs: 1285 cells/uL (ref 850–3900)
MCH: 33.1 pg — ABNORMAL HIGH (ref 27.0–33.0)
MCHC: 34.1 g/dL (ref 32.0–36.0)
MCV: 96.9 fL (ref 80.0–100.0)
MONOS PCT: 8.1 %
MPV: 10.4 fL (ref 7.5–12.5)
Neutro Abs: 5981 cells/uL (ref 1500–7800)
Neutrophils Relative %: 71.2 %
Platelets: 289 10*3/uL (ref 140–400)
RBC: 2.6 10*6/uL — ABNORMAL LOW (ref 3.80–5.10)
RDW: 13.6 % (ref 11.0–15.0)
Total Lymphocyte: 15.3 %
WBC: 8.4 10*3/uL (ref 3.8–10.8)

## 2018-04-29 LAB — SEDIMENTATION RATE

## 2018-05-01 ENCOUNTER — Ambulatory Visit (INDEPENDENT_AMBULATORY_CARE_PROVIDER_SITE_OTHER): Payer: Medicare Other | Admitting: *Deleted

## 2018-05-01 DIAGNOSIS — I749 Embolism and thrombosis of unspecified artery: Secondary | ICD-10-CM

## 2018-05-01 DIAGNOSIS — Z5181 Encounter for therapeutic drug level monitoring: Secondary | ICD-10-CM | POA: Diagnosis not present

## 2018-05-01 DIAGNOSIS — I4891 Unspecified atrial fibrillation: Secondary | ICD-10-CM

## 2018-05-01 LAB — POCT INR: INR: 3.2 — AB (ref 2.0–3.0)

## 2018-05-01 NOTE — Patient Instructions (Signed)
Hold coumadin tonight then decrease dose to 4mg  daily except 2.5mg  on Wednesdays and Saturdays Recheck in 3 weeks

## 2018-05-02 ENCOUNTER — Ambulatory Visit (HOSPITAL_COMMUNITY): Payer: Medicare Other

## 2018-05-02 ENCOUNTER — Other Ambulatory Visit: Payer: Self-pay | Admitting: *Deleted

## 2018-05-02 ENCOUNTER — Other Ambulatory Visit: Payer: Self-pay | Admitting: Family Medicine

## 2018-05-02 DIAGNOSIS — S91002S Unspecified open wound, left ankle, sequela: Secondary | ICD-10-CM | POA: Diagnosis not present

## 2018-05-02 DIAGNOSIS — R601 Generalized edema: Secondary | ICD-10-CM | POA: Diagnosis not present

## 2018-05-02 DIAGNOSIS — Z7901 Long term (current) use of anticoagulants: Secondary | ICD-10-CM

## 2018-05-02 DIAGNOSIS — Z79899 Other long term (current) drug therapy: Secondary | ICD-10-CM

## 2018-05-02 NOTE — Therapy (Signed)
[facility] [address] [place], [place], [address] Phone: [phone]   Fax:  [phone]  Wound Care Therapy  Patient Details  Name: [name] MRN: [id number] Date of Birth: [birth year] Referring Provider (PT): [place]   Encounter Date: 05/02/2018  PT End of Session - 05/02/18 1359    Visit Number  2    Number of Visits  12    Date for PT Re-Evaluation  06/09/18    Authorization Type  medicare    Authorization Time Period  Cert: 13/24-->4/0/102712/27-->06/09/2018    Authorization - Visit Number  2    Authorization - Number of Visits  10    PT Start Time  1305    PT Stop Time  1346    PT Time Calculation (min)  41 min    Activity Tolerance  Patient tolerated treatment well    Behavior During Therapy  [facility] for tasks assessed/performed       Past Medical History:  Diagnosis Date  . Anemia    Hemoglobin of 11.6 in 01/2010; 12.1 in 08/2010  . Atrial fibrillation (HCC)    chronic requiring anticoagulation  . Chronic anticoagulation   . Chronic kidney disease    Stage III; creatinine of 1.7 in 04/2007 and 1.5 2009; 1.7 in 04/2010  . Hyperlipidemia    Total cholesterol of 226, triglycerides of 90, HDL of 49 and LDL of 159 in 01/2010  . Hypertension     Past Surgical History:  Procedure Laterality Date  . APPENDECTOMY    . BUNIONECTOMY  1995  . PARTIAL HYSTERECTOMY      There were no vitals filed for this visit.   Subjective Assessment - 05/02/18 1446    Subjective  Pt arrived wiht daughter who reports very difficult to transfer mom out of home for wound care due to inability to ambulate and several steps on porch.  Pt arrived with dressings intact.      Currently in Pain?  No/denies                Wound Therapy - 05/02/18 1453    Subjective  Pt arrived wiht daughter who reports very difficult to transfer mom out of home for wound care due to inability to ambulate and several steps on porch.  Pt arrived with dressings  intact.      Patient and Family Stated Goals  for wound to heal.     Date of Onset  04/22/18    Prior Treatments  self care, antibiotic at ER    Pain Scale  0-10    Pain Score  0-No pain    Evaluation and Treatment Procedures Explained to Patient/Family  Yes    Evaluation and Treatment Procedures  agreed to    Wound Properties Date First Assessed: 04/28/18 Time First Assessed: 1440 Wound Type: Other (Comment) Location: Ankle Location Orientation: Left;Medial Wound Description (Comments): dark red not granulation, hypertrophic  Present on Admission: Yes   Dressing Type  Silver hydrofiber;Compression wrap    Dressing Changed  Changed    Dressing Status  Old drainage    Dressing Change Frequency  PRN    Site / Wound Assessment  Dusky;Friable    % Wound base Other/Granulation Tissue (Comment)  --   total wound is more dark, dusky red not healthy granulation   Wound Length (cm)  4 cm   was 4   Wound Width (cm)  3 cm   was 6 cm  Wound Depth (cm)  --   wound bed is hypergranulated rising approximately .5cm   Wound Surface Area (cm^2)  12 cm^2    Margins  Attached edges (approximated)    Drainage Amount  Copious    Drainage Description  Serosanguineous    Treatment  Cleansed;Debridement (Selective)    Selective Debridement - Location  dead skin around wound     Selective Debridement - Tools Used  Forceps    Selective Debridement - Tissue Removed  dead skin    Wound Therapy - Clinical Statement  Pt arrived with dressing intact.  Daughter reports increased difficulty transfering pt to Outpt rehab, requiring 2 people max A to transfer up and down stairs, plans to request home health via MD.  Upon removal of dressings copious drainage with noted friable skin intergity perimeter.  Noted 5 small openings on medial and lateral aspect with blood coming out upon palpation.  Wound cleansed and redressed with profore wrap and silverhydrofiber to address draininage.  MD office called and left message to  nurse pertaining wish of home health vs outpt due to increased difficulty to get here.      Wound Therapy - Functional Problem List  wound bleeds very easily so it is difficult to don socks.     Factors Delaying/Impairing Wound Healing  Altered sensation;Infection - systemic/local;Immobility;Multiple medical problems;Polypharmacy    Hydrotherapy Plan  Debridement;Dressing change;Patient/family education    Wound Therapy - Frequency  2X / week    Wound Therapy - Current Recommendations  PT    Wound Plan  Cleanse, debride and moisturize LE and wound followed by a compression dressing.  F/U wiht MD on transition to home health vs OP.      Dressing   silver hydrofiber to wound bed followed by Profore.                 PT Short Term Goals - 04/28/18 1602      PT SHORT TERM GOAL #1   Title  PT daughter to be able to verbalize the sign and symptoms of cellulitis and the need to get medical attention immediatelyl.     Time  1    Period  Weeks    Status  New    Target Date  05/05/18      PT SHORT TERM GOAL #2   Title  Wound to decrease in size by 1.5 cm LEngth and width and no longer by hypergranulated     Time  3    Period  Weeks    Status  New    Target Date  05/19/18        PT Long Term Goals - 04/28/18 1605      PT LONG TERM GOAL #1   Title  PT wound to be 100% granulated with healthy red tissue     Time  6    Period  Weeks    Status  New    Target Date  06/09/18      PT LONG TERM GOAL #2   Title  PT wound size to be decreased to no greater than 2x2 cm to allow daughter to feel confident at self care of wound      Time  6    Period  Weeks    Status  New      PT LONG TERM GOAL #3   Title  PT to be able to don a sock without causing wound to bleed     Time  6    Period  Weeks    Status  New              Patient will benefit from skilled therapeutic intervention in order to improve the following deficits and impairments:     Visit Diagnosis: Ankle wound,  left, sequela  Generalized edema     Problem List Patient Active Problem List   Diagnosis Date Noted  . Impaired gait and mobility 05/24/2017  . Weakness 04/28/2016  . Loss of weight 10/22/2013  . Encounter for therapeutic drug monitoring 06/04/2013  . Dementia (HCC) 05/18/2013  . UTI (urinary tract infection) 10/05/2012  . Chronic insomnia 06/30/2012  . Headache(784.0) 03/03/2012  . Eosinophilia 11/27/2011  . Peripheral vascular disease (HCC) 09/29/2011  . Chronic kidney disease   . Tobacco abuse, in remission   . Chronic anticoagulation   . 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 Hyperlipidemia 01/01/2010  . Anemia 04/09/2009  . Hypertension 04/09/2009  . ATRIAL FIBRILLATION, CHRONIC 04/09/2009   Becky Sax, LPTA; CBIS [phone]  Juel Burrow 05/02/2018, 3:05 PM  [facility] [address] [place], [place], [address] Phone: [phone]   Fax:  [phone]  Name: [name] MRN: [id number] Date of Birth: [birth year]

## 2018-05-04 ENCOUNTER — Telehealth (HOSPITAL_COMMUNITY): Payer: Self-pay

## 2018-05-04 ENCOUNTER — Telehealth: Payer: Self-pay | Admitting: *Deleted

## 2018-05-04 ENCOUNTER — Ambulatory Visit (HOSPITAL_COMMUNITY): Payer: Medicare Other | Attending: Family Medicine

## 2018-05-04 ENCOUNTER — Encounter (HOSPITAL_COMMUNITY): Payer: Self-pay

## 2018-05-04 DIAGNOSIS — L97329 Non-pressure chronic ulcer of left ankle with unspecified severity: Secondary | ICD-10-CM

## 2018-05-04 DIAGNOSIS — L03116 Cellulitis of left lower limb: Secondary | ICD-10-CM

## 2018-05-04 DIAGNOSIS — R601 Generalized edema: Secondary | ICD-10-CM | POA: Diagnosis not present

## 2018-05-04 DIAGNOSIS — S91002S Unspecified open wound, left ankle, sequela: Secondary | ICD-10-CM | POA: Diagnosis not present

## 2018-05-04 NOTE — Telephone Encounter (Signed)
Received return call from [place]. Reports that wound center cannot order [facility], but they can give recommendations for wound care once [facility] services are in place.   MD please advise.

## 2018-05-04 NOTE — Telephone Encounter (Signed)
Received call from [place] with [facility] @ [address] [place], [place], [place], [address]~  Phone: [phone]   Fax:  [phone]  Reports that patient daughter is having difficulty with transportation of patient to wound venter multiple times per week and is requesting HH SN to be ordered for wound care.  MD made aware and inquired as to if they can order [facility] SN as wound care treatment orders would need to come from them. Call placed to wound center to inquire. LMTRC.

## 2018-05-04 NOTE — Telephone Encounter (Signed)
Left message on Dr. Deirdre Peer line concerning recommendation to home health vs OP wound care due to difficulty with mobility and transportation.  Contant number given for call of any questions.    [address], LPTA; CBIS [phone]

## 2018-05-04 NOTE — Therapy (Signed)
[facility] [address] [place], [place], [address] Phone: [phone]   Fax:  [phone]  Wound Care Therapy  Patient Details  Name: [name] MRN: [id number] Date of Birth: 03/18/1929 Referring Provider (PT): [place]   Encounter Date: 05/04/2018  PT End of Session - 05/04/18 1445    Visit Number  3    Number of Visits  12    Date for PT Re-Evaluation  06/09/18    Authorization Type  medicare    Authorization Time Period  Cert: 10/31-->09/09/4583    Authorization - Visit Number  3    Authorization - Number of Visits  10    PT Start Time  1350    PT Stop Time  1432    PT Time Calculation (min)  42 min    Activity Tolerance  Patient tolerated treatment well    Behavior During Therapy  [facility] for tasks assessed/performed       Past Medical History:  Diagnosis Date  . Anemia    Hemoglobin of 11.6 in 01/2010; 12.1 in 08/2010  . Atrial fibrillation (HCC)    chronic requiring anticoagulation  . Chronic anticoagulation   . Chronic kidney disease    Stage III; creatinine of 1.7 in 04/2007 and 1.5 2009; 1.7 in 04/2010  . Hyperlipidemia    Total cholesterol of 226, triglycerides of 90, HDL of 49 and LDL of 159 in 01/2010  . Hypertension     Past Surgical History:  Procedure Laterality Date  . APPENDECTOMY    . BUNIONECTOMY  1995  . PARTIAL HYSTERECTOMY      There were no vitals filed for this visit.   Subjective Assessment - 05/04/18 1501    Subjective  Pt arrived with daughter with dressings intact.  Daughter stated she continues to have difficulty transfering mom to wound clinic, does plan to stop by MD's office later today.      Currently in Pain?  No/denies                Wound Therapy - 05/04/18 1542    Subjective  Pt arrived with daughter with dressings intact.  Daughter stated she continues to have difficulty transfering mom to wound clinic, does plan to stop by MD's office later today.      Patient and  Family Stated Goals  for wound to heal.     Date of Onset  04/22/18    Prior Treatments  self care, antibiotic at ER    Pain Scale  0-10    Pain Score  0-No pain    Evaluation and Treatment Procedures Explained to Patient/Family  Yes    Evaluation and Treatment Procedures  agreed to    Wound Properties Date First Assessed: 04/28/18 Time First Assessed: 1440 Wound Type: Other (Comment) Location: Ankle Location Orientation: Left;Medial Wound Description (Comments): dark red not granulation, hypertrophic  Present on Admission: Yes   Dressing Type  Silver hydrofiber;Compression wrap    Dressing Status  Old drainage    Dressing Change Frequency  PRN    Site / Wound Assessment  Dusky;Friable    % Wound base Other/Granulation Tissue (Comment)  --   total wound is dark, dusky red wiht no granulation tissues   Peri-wound Assessment  Edema    Wound Length (cm)  3 cm   was 4   Wound Width (cm)  2.8 cm   was 6   Wound Depth (cm)  --   unknown depth.  Wound bed is hypergranulated rising ~.5 cm   Wound Surface Area (cm^2)  8.4 cm^2    Margins  Attached edges (approximated)    Drainage Amount  Moderate    Drainage Description  Serosanguineous    Treatment  Cleansed;Debridement (Selective)    Selective Debridement - Location  dead skin around wound     Selective Debridement - Tools Used  Forceps    Selective Debridement - Tissue Removed  dead skin    Wound Therapy - Clinical Statement  Wound is decreasing overall size.  Wound bed continues to hypergranulate approximately .5cm.  Decreased overall drainage as well since last session.  Continues to have friable skin integrity perimeter of wound.  Daughter continues to stated mobility and transfering out of home is difficulty, requires 2 people to get down steps.  MD has been [facility].      Wound Therapy - Functional Problem List  wound bleeds very easily so it is difficult to don socks.     Factors  Delaying/Impairing Wound Healing  Altered sensation;Infection - systemic/local;Immobility;Multiple medical problems;Polypharmacy    Hydrotherapy Plan  Debridement;Dressing change;Patient/family education    Wound Therapy - Frequency  2X / week    Wound Therapy - Current Recommendations  PT    Wound Plan  Cleanse, debride and moisturize LE and wound followed by a compression dressing.  F/U wiht MD on transition to home health vs OP.      Dressing   silver hydrofiber to wound bed followed by Profore.                 PT Short Term Goals - 04/28/18 1602      PT SHORT TERM GOAL #1   Title  PT daughter to be able to verbalize the sign and symptoms of cellulitis and the need to get medical attention immediatelyl.     Time  1    Period  Weeks    Status  New    Target Date  05/05/18      PT SHORT TERM GOAL #2   Title  Wound to decrease in size by 1.5 cm LEngth and width and no longer by hypergranulated     Time  3    Period  Weeks    Status  New    Target Date  05/19/18        PT Long Term Goals - 04/28/18 1605      PT LONG TERM GOAL #1   Title  PT wound to be 100% granulated with healthy red tissue     Time  6    Period  Weeks    Status  New    Target Date  06/09/18      PT LONG TERM GOAL #2   Title  PT wound size to be decreased to no greater than 2x2 cm to allow daughter to feel confident at self care of wound      Time  6    Period  Weeks    Status  New      PT LONG TERM GOAL #3   Title  PT to be able to don a sock without causing wound to bleed     Time  6    Period  Weeks    Status  New              Patient will benefit from skilled therapeutic intervention in order to improve  the following deficits and impairments:     Visit Diagnosis: Ankle wound, left, sequela  Generalized edema     Problem List Patient Active Problem List   Diagnosis Date Noted  . Impaired gait and mobility 05/24/2017  . Weakness 04/28/2016  . Loss of weight  10/22/2013  . Encounter for therapeutic drug monitoring 06/04/2013  . Dementia (HCC) 05/18/2013  . UTI (urinary tract infection) 10/05/2012  . Chronic insomnia 06/30/2012  . Headache(784.0) 03/03/2012  . Eosinophilia 11/27/2011  . Peripheral vascular disease (HCC) 09/29/2011  . Chronic kidney disease   . Tobacco abuse, in remission   . Chronic anticoagulation   . 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 Hyperlipidemia 01/01/2010  . Anemia 04/09/2009  . Hypertension 04/09/2009  . ATRIAL FIBRILLATION, CHRONIC 04/09/2009   Becky Sax, LPTA; CBIS [phone]  Juel Burrow 05/04/2018, 3:48 PM  [facility] [address] [place], [place], [address] Phone: [phone]   Fax:  [phone]  Name: [name] MRN: [id number] Date of Birth: 1928-10-22

## 2018-05-05 NOTE — Telephone Encounter (Signed)
[name] with wound center contacted for wound care recommendations.

## 2018-05-05 NOTE — Telephone Encounter (Signed)
[facility], they need to fax me the recommendation for her wound

## 2018-05-05 NOTE — Telephone Encounter (Signed)
Thank you for your recommendations.   Dr. Jeanice Lim has agreed to order [facility] SN for wound care. We will be placing this order. However, she is requesting treatment recommendations. Do you want to continue silver hydrofiber to wound bed followed by Profore in the home setting?

## 2018-05-08 ENCOUNTER — Telehealth (HOSPITAL_COMMUNITY): Payer: Self-pay | Admitting: Physical Therapy

## 2018-05-08 ENCOUNTER — Telehealth: Payer: Self-pay | Admitting: *Deleted

## 2018-05-08 NOTE — Telephone Encounter (Signed)
Recommendations for wound care are as follows: continue silver hydrofiber to wound bed followed by Profore  HH Orders placed.

## 2018-05-08 NOTE — Telephone Encounter (Signed)
I spoke with patient's daughter concerning the swelling in her leg and told her that Arline Asp said to take the wraps off until and just put some regular dressings on it for now or try and take her to the MD  office. WT

## 2018-05-08 NOTE — Telephone Encounter (Signed)
Message to be closed.

## 2018-05-08 NOTE — Telephone Encounter (Signed)
Received call from [name].   Reports that patient foot with ulceration at ankle noted to have increased edema. States that she had Profore wrapping on foot, but toes outside of wrapping and area above wrapping were incredibly . States that she was informed to remove wrap from foot, but now the whole foot is swollen. States that foot "is as big as a tick, and looks like it's gonna pop."  Reports that patient cannot bear weight on foot. Advised that foot needs to be evaluated ASAP. Advised to go to ER.   MD made aware.

## 2018-05-08 NOTE — Telephone Encounter (Signed)
noted 

## 2018-05-09 ENCOUNTER — Encounter (HOSPITAL_COMMUNITY): Payer: Self-pay

## 2018-05-09 ENCOUNTER — Ambulatory Visit (HOSPITAL_COMMUNITY): Payer: Medicare Other

## 2018-05-09 DIAGNOSIS — R601 Generalized edema: Secondary | ICD-10-CM | POA: Diagnosis not present

## 2018-05-09 DIAGNOSIS — S91002S Unspecified open wound, left ankle, sequela: Secondary | ICD-10-CM | POA: Diagnosis not present

## 2018-05-09 NOTE — Therapy (Signed)
[facility] [address] [place], [place], [address] Phone: [phone]   Fax:  [phone]  Wound Care Therapy  Patient Details  Name: Isabel ShortsJessie [name] Flanary MRN: [id number] Date of Birth: [birth year] Referring Provider (PT): [place]   Encounter Date: 05/09/2018  PT End of Session - 05/09/18 1258    Visit Number  4    Number of Visits  12    Date for PT Re-Evaluation  06/09/18    Authorization Type  medicare    Authorization Time Period  Cert: 32/44-->0/1/027212/27-->06/09/2018    Authorization - Visit Number  4    Authorization - Number of Visits  10    PT Start Time  1020    PT Stop Time  1108    PT Time Calculation (min)  48 min    Activity Tolerance  Patient tolerated treatment well    Behavior During Therapy  [facility] for tasks assessed/performed       Past Medical History:  Diagnosis Date  . Anemia    Hemoglobin of 11.6 in 01/2010; 12.1 in 08/2010  . Atrial fibrillation (HCC)    chronic requiring anticoagulation  . Chronic anticoagulation   . Chronic kidney disease    Stage III; creatinine of 1.7 in 04/2007 and 1.5 2009; 1.7 in 04/2010  . Hyperlipidemia    Total cholesterol of 226, triglycerides of 90, HDL of 49 and LDL of 159 in 01/2010  . Hypertension     Past Surgical History:  Procedure Laterality Date  . APPENDECTOMY    . BUNIONECTOMY  1995  . PARTIAL HYSTERECTOMY      There were no vitals filed for this visit.   Subjective Assessment - 05/09/18 1116    Subjective  Daugter reports increased pain and swelling around toes this weekend, has called wound center and MD office who advised her to remove dressings and ER.  Pt arrived with dressings intact.    Patient is accompained by:  Family member   Daughter   Currently in Pain?  Yes    Pain Score  8     Pain Location  Ankle    Pain Orientation  Left    Pain Type  Acute pain                Wound Therapy - 05/09/18 1258    Subjective  Daugter reports increased pain and  swelling around toes this weekend, has called wound center and MD office who advised her to remove dressings and ER.  Pt arrived with dressings intact.    Patient and Family Stated Goals  for wound to heal.     Date of Onset  04/22/18    Prior Treatments  self care, antibiotic at ER    Pain Scale  0-10    Pain Score  8     Pain Type  Acute pain    Pain Location  Ankle    Pain Orientation  Left    Evaluation and Treatment Procedures Explained to Patient/Family  Yes    Evaluation and Treatment Procedures  agreed to    Wound Properties Date First Assessed: 04/28/18 Time First Assessed: 1440 Wound Type: Other (Comment) Location: Ankle Location Orientation: Left;Medial Wound Description (Comments): dark red not granulation, hypertrophic  Present on Admission: Yes   Dressing Type  Silver hydrofiber;Compression wrap   extra cotton and toe wrap   Dressing Changed  Changed    Dressing Status  Old drainage    Dressing Change  Frequency  PRN    Site / Wound Assessment  Dusky;Friable    % Wound base Red or Granulating  0%    % Wound base Yellow/Fibrinous Exudate  95%    % Wound base Black/Eschar  5%    % Wound base Other/Granulation Tissue (Comment)  --   total wound is dark, dusky red with no granulation tissues   Peri-wound Assessment  Edema    Wound Length (cm)  3 cm    Wound Width (cm)  2.3 cm   was 2.8   Wound Depth (cm)  --   unknown depth   Wound Surface Area (cm^2)  6.9 cm^2    Margins  Attached edges (approximated)    Drainage Amount  Moderate    Drainage Description  Serosanguineous    Treatment  Cleansed;Debridement (Selective)    Selective Debridement - Location  dead skin around wound     Selective Debridement - Tools Used  Forceps    Selective Debridement - Tissue Removed  dead skin    Wound Therapy - Clinical Statement  Daughter with pt and worried due to increased edema at toes, pt called and was advised to remove dressings and/or ER.  Though was advised to remove dressings pt  arrived wiht the dressings still intact.  Upon arrival noted increased edema present toes and bruising on anterior ankle.  Wound improved with decrease in overall size and no longer hypergranulation tissue over wound.  Wound was cleansed and debridement for dead skin perimeter of wound to assist with healing.  Added extra cotton and toe wrap to address edema.  EOS no reports of pain.  Pt very drowsy through session.      Wound Therapy - Functional Problem List  wound bleeds very easily so it is difficult to don socks.     Factors Delaying/Impairing Wound Healing  Altered sensation;Infection - systemic/local;Immobility;Multiple medical problems;Polypharmacy    Hydrotherapy Plan  Debridement;Dressing change;Patient/family education    Wound Therapy - Frequency  2X / week    Wound Therapy - Current Recommendations  PT    Wound Plan  Cleanse, debride and moisturize LE and wound followed by a compression dressing.  F/U wiht MD on transition to home health vs OP.      Dressing   silver hydrofiber to wound bed followed by Profore and extra cotton                PT Short Term Goals - 04/28/18 1602      PT SHORT TERM GOAL #1   Title  PT daughter to be able to verbalize the sign and symptoms of cellulitis and the need to get medical attention immediatelyl.     Time  1    Period  Weeks    Status  New    Target Date  05/05/18      PT SHORT TERM GOAL #2   Title  Wound to decrease in size by 1.5 cm LEngth and width and no longer by hypergranulated     Time  3    Period  Weeks    Status  New    Target Date  05/19/18        PT Long Term Goals - 04/28/18 1605      PT LONG TERM GOAL #1   Title  PT wound to be 100% granulated with healthy red tissue     Time  6    Period  Weeks    Status  New  Target Date  06/09/18      PT LONG TERM GOAL #2   Title  PT wound size to be decreased to no greater than 2x2 cm to allow daughter to feel confident at self care of wound      Time  6     Period  Weeks    Status  New      PT LONG TERM GOAL #3   Title  PT to be able to don a sock without causing wound to bleed     Time  6    Period  Weeks    Status  New              Patient will benefit from skilled therapeutic intervention in order to improve the following deficits and impairments:     Visit Diagnosis: Ankle wound, left, sequela  Generalized edema     Problem List Patient Active Problem List   Diagnosis Date Noted  . Impaired gait and mobility 05/24/2017  . Weakness 04/28/2016  . Loss of weight 10/22/2013  . Encounter for therapeutic drug monitoring 06/04/2013  . Dementia (HCC) 05/18/2013  . UTI (urinary tract infection) 10/05/2012  . Chronic insomnia 06/30/2012  . Headache(784.0) 03/03/2012  . Eosinophilia 11/27/2011  . Peripheral vascular disease (HCC) 09/29/2011  . Chronic kidney disease   . Tobacco abuse, in remission   . Chronic anticoagulation   . 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 Hyperlipidemia 01/01/2010  . Anemia 04/09/2009  . Hypertension 04/09/2009  . ATRIAL FIBRILLATION, CHRONIC 04/09/2009   Becky Saxasey Cockerham, LPTA; CBIS [phone]  Juel BurrowCockerham, Casey Jo 05/09/2018, 1:06 PM  [facility] [address] [place], [place], [address] Phone: [phone]   Fax:  [phone]  Name: Isabel ShortsJessie [name] Kates MRN: [id number] Date of Birth: [birth year]

## 2018-05-11 ENCOUNTER — Telehealth (HOSPITAL_COMMUNITY): Payer: Self-pay

## 2018-05-11 ENCOUNTER — Ambulatory Visit (HOSPITAL_COMMUNITY): Payer: Medicare Other | Admitting: Physical Therapy

## 2018-05-11 DIAGNOSIS — R601 Generalized edema: Secondary | ICD-10-CM | POA: Diagnosis not present

## 2018-05-11 DIAGNOSIS — S91002S Unspecified open wound, left ankle, sequela: Secondary | ICD-10-CM

## 2018-05-11 NOTE — Telephone Encounter (Signed)
Call and spoke to Dr Deirdre Peer nurse, [place] Nora Six, LPN concerning recommended dressings for [facility] wound care.  She reports order has been placed for transition of care.    [address], LPTA; CBIS [phone]

## 2018-05-11 NOTE — Therapy (Signed)
[employer] [facility] [place], [place], [address] Phone: [phone]   Fax:  [phone]  Wound Care Therapy  Patient Details  Name: [name] MRN: [id number] Date of Birth: [birth year] Referring Provider (PT): [place]   Encounter Date: 05/11/2018  PT End of Session - 05/11/18 1203    Visit Number  5    Number of Visits  12    Date for PT Re-Evaluation  06/09/18    Authorization Type  medicare    Authorization Time Period  Cert: 37/90-->06/07/971    Authorization - Visit Number  5    Authorization - Number of Visits  10    PT Start Time  1110    PT Stop Time  1150    PT Time Calculation (min)  40 min    Activity Tolerance  Patient tolerated treatment well    Behavior During Therapy  [facility] for tasks assessed/performed       Past Medical History:  Diagnosis Date  . Anemia    Hemoglobin of 11.6 in 01/2010; 12.1 in 08/2010  . Atrial fibrillation ([place])    chronic requiring anticoagulation  . Chronic anticoagulation   . Chronic kidney disease    Stage III; creatinine of 1.7 in 04/2007 and 1.5 2009; 1.7 in 04/2010  . Hyperlipidemia    Total cholesterol of 226, triglycerides of 90, HDL of 49 and LDL of 159 in 01/2010  . Hypertension     Past Surgical History:  Procedure Laterality Date  . APPENDECTOMY    . BUNIONECTOMY  1995  . PARTIAL HYSTERECTOMY      There were no vitals filed for this visit.              Wound Therapy - 05/11/18 1154    Subjective  PT  states she can't really give a number for pain but but it is about halfway.      Patient and Family Stated Goals  for wound to heal.     Date of Onset  04/22/18    Prior Treatments  self care, antibiotic at ER    Pain Scale  0-10    Pain Score  5     Pain Type  Acute pain    Pain Location  Ankle    Pain Orientation  Left    Evaluation and Treatment Procedures Explained to Patient/Family  Yes    Evaluation and Treatment Procedures  agreed to    Wound  Properties Date First Assessed: 04/28/18 Time First Assessed: 1440 Wound Type: Other (Comment) Location: Ankle Location Orientation: Left;Medial Wound Description (Comments): dark red not granulation, hypertrophic  Present on Admission: Yes   Dressing Type  Silver hydrofiber;Compression wrap   extra cotton and toe wrap   Dressing Changed  Changed    Dressing Status  Old drainage    Dressing Change Frequency  PRN    Site / Wound Assessment  Dusky;Friable    % Wound base Red or Granulating  25%    % Wound base Yellow/Fibrinous Exudate  75%    % Wound base Black/Eschar  5%    % Wound base Other/Granulation Tissue (Comment)  --    Peri-wound Assessment  Edema    Wound Depth (cm)  --   at least .3 in areas woundbed is cavernous.   Margins  Attached edges (approximated)    Drainage Amount  Moderate    Drainage Description  Serosanguineous    Treatment  Cleansed;Debridement (Selective);Other (Comment)  manual to decrease edema.    Selective Debridement - Location  dead skin around wound     Selective Debridement - Tools Used  Forceps    Selective Debridement - Tissue Removed  devitalized tissue.  Wound bed if friable therefore debridement is limited.     Wound Therapy - Clinical Statement  Noted decreased edema in toes and forefoot although forefoot still has some edema present.  Therapist changed dressing to profore lite to see if this would improve overall comfort of dressing.  There is no longer any hypergranulation which was present on evaluation, however, wound is not showing depth where tissue has become devitalized.     Wound Therapy - Functional Problem List  wound bleeds very easily so it is difficult to don socks.     Factors Delaying/Impairing Wound Healing  Altered sensation;Infection - systemic/local;Immobility;Multiple medical problems;Polypharmacy    Hydrotherapy Plan  Debridement;Dressing change;Patient/family education    Wound Therapy - Frequency  2X / week    Wound Therapy -  Current Recommendations  PT    Wound Plan  Cleanse, debride and moisturize LE and wound followed by a compression dressing.  F/U wiht MD on transition to home health vs OP.      Dressing   silver hydrofiber f/b 2x2 to wound bed followed by Profore lite, toe bandaging and  extra cotton                PT Short Term Goals - 05/11/18 1203      PT SHORT TERM GOAL #1   Title  PT daughter to be able to verbalize the sign and symptoms of cellulitis and the need to get medical attention immediatelyl.     Time  1    Period  Weeks    Status  Achieved      PT SHORT TERM GOAL #2   Title  Wound to decrease in size by 1.5 cm LEngth and width and no longer by hypergranulated     Time  3    Period  Weeks    Status  Partially Met        PT Long Term Goals - 05/11/18 1204      PT LONG TERM GOAL #1   Title  PT wound to be 100% granulated with healthy red tissue     Time  6    Period  Weeks    Status  On-going      PT LONG TERM GOAL #2   Title  PT wound size to be decreased to no greater than 2x2 cm to allow daughter to feel confident at self care of wound      Time  6    Period  Weeks    Status  On-going      PT LONG TERM GOAL #3   Title  PT to be able to don a sock without causing wound to bleed     Time  6    Period  Weeks    Status  On-going            Plan - 05/11/18 1203    Clinical Impression Statement  see above     Clinical Presentation  Stable    Clinical Decision Making  Low    Rehab Potential  Good    PT Frequency  2x / week    PT Duration  6 weeks    PT Treatment/Interventions  ADLs/Self Care Home Management;Manual techniques   debridement and compression dressing  PT Next Visit Plan  PT to be seen 2x a week for 6 weeks or until wound is healed     Consulted and Agree with Plan of Care  Patient;Family member/caregiver       Patient will benefit from skilled therapeutic intervention in order to improve the following deficits and impairments:   Decreased skin integrity, Increased edema, Pain  Visit Diagnosis: Ankle wound, left, sequela  Generalized edema     Problem List Patient Active Problem List   Diagnosis Date Noted  . Impaired gait and mobility 05/24/2017  . Weakness 04/28/2016  . Loss of weight 10/22/2013  . Encounter for therapeutic drug monitoring 06/04/2013  . Dementia ([place]) 05/18/2013  . UTI (urinary tract infection) 10/05/2012  . Chronic insomnia 06/30/2012  . Headache(784.0) 03/03/2012  . Eosinophilia 11/27/2011  . Peripheral vascular disease ([place]) 09/29/2011  . Chronic kidney disease   . Tobacco abuse, in remission   . Chronic anticoagulation   . 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 Hyperlipidemia 01/01/2010  . Anemia 04/09/2009  . Hypertension 04/09/2009  . ATRIAL FIBRILLATION, CHRONIC 04/09/2009    Rayetta Humphrey, PT CLT [phone] 05/11/2018, 12:04 PM  [facility] [address] [place], [place], [address] Phone: [phone]   Fax:  [phone]  Name: JAKYRAH HOLLADAY MRN: [id number] Date of Birth: 09-10-1928

## 2018-05-16 ENCOUNTER — Ambulatory Visit (HOSPITAL_COMMUNITY): Payer: Medicare Other | Admitting: Physical Therapy

## 2018-05-16 DIAGNOSIS — S91002S Unspecified open wound, left ankle, sequela: Secondary | ICD-10-CM

## 2018-05-16 DIAGNOSIS — R601 Generalized edema: Secondary | ICD-10-CM | POA: Diagnosis not present

## 2018-05-16 NOTE — Therapy (Signed)
[place], [place], [address] Phone: [phone]   Fax:  [phone]  Wound Care Therapy  Patient Details  Name: [name] MRN: [id number] Date of Birth: 1928-10-17 Referring Provider (PT): [place]   Encounter Date: 05/16/2018  PT End of Session - 05/16/18 1332    Visit Number  6    Number of Visits  12    Date for PT Re-Evaluation  06/09/18    Authorization Type  medicare    Authorization Time Period  Cert: 35/36-->05/06/4313    Authorization - Visit Number  6    Authorization - Number of Visits  10    PT Start Time  1120    PT Stop Time  1158    PT Time Calculation (min)  38 min    Activity Tolerance  Patient tolerated treatment well    Behavior During Therapy  [facility] for tasks assessed/performed       Past Medical History:  Diagnosis Date  . Anemia    Hemoglobin of 11.6 in 01/2010; 12.1 in 08/2010  . Atrial fibrillation ([place])    chronic requiring anticoagulation  . Chronic anticoagulation   . Chronic kidney disease    Stage III; creatinine of 1.7 in 04/2007 and 1.5 2009; 1.7 in 04/2010  . Hyperlipidemia    Total cholesterol of 226, triglycerides of 90, HDL of 49 and LDL of 159 in 01/2010  . Hypertension     Past Surgical History:  Procedure Laterality Date  . APPENDECTOMY    . BUNIONECTOMY  1995  . PARTIAL HYSTERECTOMY      There were no vitals filed for this visit.              Wound Therapy - 05/16/18 1327    Subjective  PT states that she has not been having any pain in her leg     Patient and Family Stated Goals  for wound to heal.     Date of Onset  04/22/18    Prior Treatments  self care, antibiotic at ER    Pain Scale  0-10    Pain Score  0-No pain    Evaluation and Treatment Procedures Explained to Patient/Family  Yes    Evaluation and Treatment Procedures  agreed to    Wound Properties Date First Assessed: 04/28/18 Time First Assessed: 1440 Wound Type: Other  (Comment) Location: Ankle Location Orientation: Left;Medial Wound Description (Comments): dark red not granulation, hypertrophic  Present on Admission: Yes   Dressing Type  Silver hydrofiber;Compression wrap   extra cotton and toe wrap   Dressing Changed  Changed    Dressing Status  Old drainage    Dressing Change Frequency  PRN    Site / Wound Assessment  Dusky;Friable    % Wound base Red or Granulating  50%    % Wound base Yellow/Fibrinous Exudate  50%    % Wound base Black/Eschar  --    Peri-wound Assessment  Edema    Margins  Attached edges (approximated)    Drainage Amount  Moderate    Drainage Description  Serosanguineous    Treatment  Cleansed;Debridement (Selective)    Selective Debridement - Location  slough in woundbed     Selective Debridement - Tools Used  Forceps    Selective Debridement - Tissue Removed  devitalized tissue.  Wound bed if friable therefore debridement is limited.     Wound Therapy - Clinical Statement  PT edema continutes to  decrease.  PT appears to be doing well with profore lite.  PT will continue with OP wound care to promote a healing enviornment for her wound.     Wound Therapy - Functional Problem List  wound bleeds very easily so it is difficult to don socks.     Factors Delaying/Impairing Wound Healing  Altered sensation;Infection - systemic/local;Immobility;Multiple medical problems;Polypharmacy    Hydrotherapy Plan  Debridement;Dressing change;Patient/family education    Wound Therapy - Frequency  2X / week    Wound Therapy - Current Recommendations  PT    Wound Plan  Cleanse, debride and moisturize LE and wound followed by a compression dressing.      Dressing   silver hydrofiber, with calcium alginate on top  f/b 2x2 to wound bed; xeroform to anterior ankle area where skin has sloughed off followed by Profore lite, toe bandaging and  extra cotton                PT Short Term Goals - 05/11/18 1203      PT SHORT TERM GOAL #1   Title  PT  daughter to be able to verbalize the sign and symptoms of cellulitis and the need to get medical attention immediatelyl.     Time  1    Period  Weeks    Status  Achieved      PT SHORT TERM GOAL #2   Title  Wound to decrease in size by 1.5 cm LEngth and width and no longer by hypergranulated     Time  3    Period  Weeks    Status  Partially Met        PT Long Term Goals - 05/11/18 1204      PT LONG TERM GOAL #1   Title  PT wound to be 100% granulated with healthy red tissue     Time  6    Period  Weeks    Status  On-going      PT LONG TERM GOAL #2   Title  PT wound size to be decreased to no greater than 2x2 cm to allow daughter to feel confident at self care of wound      Time  6    Period  Weeks    Status  On-going      PT LONG TERM GOAL #3   Title  PT to be able to don a sock without causing wound to bleed     Time  6    Period  Weeks    Status  On-going            Plan - 05/16/18 1333    Rehab Potential  Good    PT Frequency  2x / week    PT Duration  6 weeks    PT Treatment/Interventions  ADLs/Self Care Home Management;Manual techniques   debridement and compression dressing    PT Next Visit Plan  PT to be seen 2x a week for 6 weeks or until wound is healed.  Measure wound next visit    Consulted and Agree with Plan of Care  Patient;Family member/caregiver       Patient will benefit from skilled therapeutic intervention in order to improve the following deficits and impairments:  Decreased skin integrity, Increased edema, Pain  Visit Diagnosis: Ankle wound, left, sequela  Generalized edema     Problem List Patient Active Problem List   Diagnosis Date Noted  . Impaired gait and mobility 05/24/2017  .  Weakness 04/28/2016  . Loss of weight 10/22/2013  . Encounter for therapeutic drug monitoring 06/04/2013  . Dementia ([place]) 05/18/2013  . UTI (urinary tract infection) 10/05/2012  . Chronic insomnia 06/30/2012  . Headache(784.0) 03/03/2012  .  Eosinophilia 11/27/2011  . Peripheral vascular disease ([place]) 09/29/2011  . Chronic kidney disease   . Tobacco abuse, in remission   . Chronic anticoagulation   . 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 Hyperlipidemia 01/01/2010  . Anemia 04/09/2009  . Hypertension 04/09/2009  . ATRIAL FIBRILLATION, CHRONIC 04/09/2009    Rayetta Humphrey, PT CLT [phone] 05/16/2018, 1:34 PM  [facility] [address] [place], [place], [address] Phone: [phone]   Fax:  [phone]  Name: AVARY EICHENBERGER MRN: [id number] Date of Birth: November 27, 1928

## 2018-05-18 ENCOUNTER — Ambulatory Visit (HOSPITAL_COMMUNITY): Payer: Medicare Other | Admitting: Physical Therapy

## 2018-05-18 DIAGNOSIS — R601 Generalized edema: Secondary | ICD-10-CM

## 2018-05-18 DIAGNOSIS — S91002S Unspecified open wound, left ankle, sequela: Secondary | ICD-10-CM

## 2018-05-18 NOTE — Therapy (Signed)
[place], [place], [address] Phone: [phone]   Fax:  [phone]  Wound Care Therapy  Patient Details  Name: [name] MRN: [id number] Date of Birth: 1928/12/31 Referring Provider (PT): [place]   Encounter Date: 05/18/2018  PT End of Session - 05/18/18 1208    Visit Number  7    Number of Visits  12    Date for PT Re-Evaluation  06/09/18    Authorization Type  medicare    Authorization Time Period  Cert: 33/82-->5/0/5397    Authorization - Visit Number  7    Authorization - Number of Visits  10    PT Start Time  1115    PT Stop Time  1155    PT Time Calculation (min)  40 min    Activity Tolerance  Patient tolerated treatment well    Behavior During Therapy  [facility] for tasks assessed/performed       Past Medical History:  Diagnosis Date  . Anemia    Hemoglobin of 11.6 in 01/2010; 12.1 in 08/2010  . Atrial fibrillation ([place])    chronic requiring anticoagulation  . Chronic anticoagulation   . Chronic kidney disease    Stage III; creatinine of 1.7 in 04/2007 and 1.5 2009; 1.7 in 04/2010  . Hyperlipidemia    Total cholesterol of 226, triglycerides of 90, HDL of 49 and LDL of 159 in 01/2010  . Hypertension     Past Surgical History:  Procedure Laterality Date  . APPENDECTOMY    . BUNIONECTOMY  1995  . PARTIAL HYSTERECTOMY      There were no vitals filed for this visit.              Wound Therapy - 05/18/18 1116    Subjective  PT states that she continues to have limited pain.     Patient and Family Stated Goals  for wound to heal.     Date of Onset  04/22/18    Prior Treatments  self care, antibiotic at ER    Pain Scale  0-10    Pain Score  0-No pain    Evaluation and Treatment Procedures Explained to Patient/Family  Yes    Evaluation and Treatment Procedures  agreed to    Wound Properties Date First Assessed: 04/28/18 Time First Assessed: 1440 Wound Type: Other (Comment)  Location: Ankle Location Orientation: Left;Medial Wound Description (Comments): dark red not granulation, hypertrophic  Present on Admission: Yes   Dressing Type  Compression wrap;Silver hydrofiber   extra cotton and toe wrap   Dressing Changed  Changed    Dressing Status  Old drainage    Dressing Change Frequency  PRN    Site / Wound Assessment  Dusky;Friable    % Wound base Red or Granulating  60%    % Wound base Yellow/Fibrinous Exudate  10%    % Wound base Other/Granulation Tissue (Comment)  30%    Peri-wound Assessment  Intact    Wound Length (cm)  2.7 cm   was 3   Wound Width (cm)  1.9 cm   was 2.3   Wound Surface Area (cm^2)  5.13 cm^2    Margins  Attached edges (approximated)    Drainage Amount  Moderate    Drainage Description  Sanguineous    Treatment  Cleansed;Debridement (Selective)    Selective Debridement - Location  slough in woundbed     Selective Debridement - Tools Used  Forceps  Selective Debridement - Tissue Removed  devitalized tissue.  Wound bed if friable therefore debridement is limited.     Wound Therapy - Clinical Statement  PT no longer has edema.  Toe bandages continued this session but stop next visit to see how pt does without toe bandages.  Will decrease compression on LE as well with gause and coban no profore next treatment.  Wound continues to approximate but remains friable.     Wound Therapy - Functional Problem List  wound bleeds very easily so it is difficult to don socks.     Factors Delaying/Impairing Wound Healing  Altered sensation;Infection - systemic/local;Immobility;Multiple medical problems;Polypharmacy    Hydrotherapy Plan  Debridement;Dressing change;Patient/family education    Wound Therapy - Frequency  2X / week    Wound Therapy - Current Recommendations  PT    Wound Plan  Stop profore and toe bandages next visitl.     Dressing   silver hydrofiber, with  f/b 2x2 to wound bed; xeroform to anterior ankle area where skin has sloughed off  followed by Profore lite, toe bandaging and  extra cotton                PT Short Term Goals - 05/11/18 1203      PT SHORT TERM GOAL #1   Title  PT daughter to be able to verbalize the sign and symptoms of cellulitis and the need to get medical attention immediatelyl.     Time  1    Period  Weeks    Status  Achieved      PT SHORT TERM GOAL #2   Title  Wound to decrease in size by 1.5 cm LEngth and width and no longer by hypergranulated     Time  3    Period  Weeks    Status  Partially Met        PT Long Term Goals - 05/11/18 1204      PT LONG TERM GOAL #1   Title  PT wound to be 100% granulated with healthy red tissue     Time  6    Period  Weeks    Status  On-going      PT LONG TERM GOAL #2   Title  PT wound size to be decreased to no greater than 2x2 cm to allow daughter to feel confident at self care of wound      Time  6    Period  Weeks    Status  On-going      PT LONG TERM GOAL #3   Title  PT to be able to don a sock without causing wound to bleed     Time  6    Period  Weeks    Status  On-going              Patient will benefit from skilled therapeutic intervention in order to improve the following deficits and impairments:     Visit Diagnosis: Ankle wound, left, sequela  Generalized edema     Problem List Patient Active Problem List   Diagnosis Date Noted  . Impaired gait and mobility 05/24/2017  . Weakness 04/28/2016  . Loss of weight 10/22/2013  . Encounter for therapeutic drug monitoring 06/04/2013  . Dementia ([place]) 05/18/2013  . UTI (urinary tract infection) 10/05/2012  . Chronic insomnia 06/30/2012  . Headache(784.0) 03/03/2012  . Eosinophilia 11/27/2011  . Peripheral vascular disease ([place]) 09/29/2011  . Chronic kidney disease   . Tobacco  abuse, in remission   . Chronic anticoagulation   . 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 Hyperlipidemia 01/01/2010  . Anemia 04/09/2009  . Hypertension  04/09/2009  . ATRIAL FIBRILLATION, CHRONIC 04/09/2009    Rayetta Humphrey, PT CLT [phone] 05/18/2018, 12:08 PM  [facility] [address] [place], [place], [address] Phone: [phone]   Fax:  [phone]  Name: [name] MRN: [id number] Date of Birth: Aug 15, 1928

## 2018-05-23 ENCOUNTER — Encounter (HOSPITAL_COMMUNITY): Payer: Self-pay | Admitting: Physical Therapy

## 2018-05-23 ENCOUNTER — Ambulatory Visit (HOSPITAL_COMMUNITY): Payer: Medicare Other | Admitting: Physical Therapy

## 2018-05-23 DIAGNOSIS — R601 Generalized edema: Secondary | ICD-10-CM | POA: Diagnosis not present

## 2018-05-23 DIAGNOSIS — S91002S Unspecified open wound, left ankle, sequela: Secondary | ICD-10-CM | POA: Diagnosis not present

## 2018-05-23 NOTE — Therapy (Signed)
[place], [place], [address] Phone: [phone]   Fax:  [phone]  Wound Care Therapy  Patient Details  Name: [name] MRN: [id number] Date of Birth: Jun 03, 1928 Referring Provider (PT): [place]   Encounter Date: 05/23/2018  PT End of Session - 05/23/18 1207    Visit Number  8    Number of Visits  12    Date for PT Re-Evaluation  06/09/18    Authorization Type  medicare    Authorization Time Period  Cert: 76/19-->5/0/9326    Authorization - Visit Number  8    Authorization - Number of Visits  10    PT Start Time  1120    PT Stop Time  1155    PT Time Calculation (min)  35 min    Activity Tolerance  Patient tolerated treatment well    Behavior During Therapy  [facility] for tasks assessed/performed       Past Medical History:  Diagnosis Date  . Anemia    Hemoglobin of 11.6 in 01/2010; 12.1 in 08/2010  . Atrial fibrillation ([place])    chronic requiring anticoagulation  . Chronic anticoagulation   . Chronic kidney disease    Stage III; creatinine of 1.7 in 04/2007 and 1.5 2009; 1.7 in 04/2010  . Hyperlipidemia    Total cholesterol of 226, triglycerides of 90, HDL of 49 and LDL of 159 in 01/2010  . Hypertension     Past Surgical History:  Procedure Laterality Date  . APPENDECTOMY    . BUNIONECTOMY  1995  . PARTIAL HYSTERECTOMY      There were no vitals filed for this visit.              Wound Therapy - 05/23/18 1156    Subjective  PT states that she is not having any pain at this time.    Patient and Family Stated Goals  for wound to heal.     Date of Onset  04/22/18    Prior Treatments  self care, antibiotic at ER    Pain Scale  0-10    Pain Score  0-No pain    Evaluation and Treatment Procedures Explained to Patient/Family  Yes    Evaluation and Treatment Procedures  agreed to    Wound Properties Date First Assessed: 04/28/18 Time First Assessed: 1440 Wound Type: Other (Comment)  Location: Ankle Location Orientation: Left;Medial Wound Description (Comments): dark red not granulation, hypertrophic  Present on Admission: Yes   Dressing Type  Compression wrap;Silver hydrofiber   extra cotton and toe wrap   Dressing Changed  Changed    Dressing Status  Old drainage    Dressing Change Frequency  PRN    Site / Wound Assessment  Dusky;Friable    % Wound base Red or Granulating  80%    % Wound base Yellow/Fibrinous Exudate  5%    % Wound base Other/Granulation Tissue (Comment)  15%    Peri-wound Assessment  Intact    Wound Length (cm)  1.8 cm    Wound Width (cm)  2.7 cm    Wound Surface Area (cm^2)  4.86 cm^2    Margins  Attached edges (approximated)    Drainage Amount  Moderate    Drainage Description  Sanguineous    Treatment  Cleansed;Debridement (Selective)    Wound Properties Date First Assessed: 05/23/18 Time First Assessed: 1120 Wound Type: Other (Comment) Location: Ankle Location Orientation: Left;Anterior Wound Description (Comments): anterior aspect of  LT ankle  Present on Admission: No   Dressing Type  Impregnated gauze (bismuth)    Dressing Changed  Changed    Dressing Change Frequency  PRN    % Wound base Red or Granulating  30%    % Wound base Yellow/Fibrinous Exudate  70%    Wound Length (cm)  0.9 cm    Wound Width (cm)  0.3 cm    Wound Depth (cm)  0.2 cm    Wound Volume (cm^3)  0.05 cm^3    Wound Surface Area (cm^2)  0.27 cm^2    Drainage Amount  Scant    Treatment  Cleansed;Debridement (Selective)    Selective Debridement - Location  slough in woundbed     Selective Debridement - Tools Used  Forceps    Selective Debridement - Tissue Removed  devitalized tissue.  Wound bed if friable therefore debridement is limited.     Wound Therapy - Clinical Statement  PT had multiple areas of what appeared to be bruising on the anterior aspect of her Lt LE.  All areas have healed except for one as mentioned above.  PT medial ankle wound has increased granulation  this session therefore changed silverhydrofiber to xeroform.  Also changed dressing to 4x4, 3"kerlix and netting.      Wound Therapy - Functional Problem List  wound bleeds very easily so it is difficult to don socks.     Factors Delaying/Impairing Wound Healing  Altered sensation;Infection - systemic/local;Immobility;Multiple medical problems;Polypharmacy    Hydrotherapy Plan  Debridement;Dressing change;Patient/family education    Wound Therapy - Frequency  2X / week    Wound Therapy - Current Recommendations  PT    Wound Plan  assess how new dressing is affecting wound.     Dressing   xeroform followed by 4x4, kling and netting for both wounds.                 PT Short Term Goals - 05/11/18 1203      PT SHORT TERM GOAL #1   Title  PT daughter to be able to verbalize the sign and symptoms of cellulitis and the need to get medical attention immediatelyl.     Time  1    Period  Weeks    Status  Achieved      PT SHORT TERM GOAL #2   Title  Wound to decrease in size by 1.5 cm LEngth and width and no longer by hypergranulated     Time  3    Period  Weeks    Status  Partially Met        PT Long Term Goals - 05/11/18 1204      PT LONG TERM GOAL #1   Title  PT wound to be 100% granulated with healthy red tissue     Time  6    Period  Weeks    Status  On-going      PT LONG TERM GOAL #2   Title  PT wound size to be decreased to no greater than 2x2 cm to allow daughter to feel confident at self care of wound      Time  6    Period  Weeks    Status  On-going      PT LONG TERM GOAL #3   Title  PT to be able to don a sock without causing wound to bleed     Time  6    Period  Weeks    Status  On-going              Patient will benefit from skilled therapeutic intervention in order to improve the following deficits and impairments:     Visit Diagnosis: Ankle wound, left, sequela     Problem List Patient Active Problem List   Diagnosis Date Noted  .  Impaired gait and mobility 05/24/2017  . Weakness 04/28/2016  . Loss of weight 10/22/2013  . Encounter for therapeutic drug monitoring 06/04/2013  . Dementia ([place]) 05/18/2013  . UTI (urinary tract infection) 10/05/2012  . Chronic insomnia 06/30/2012  . Headache(784.0) 03/03/2012  . Eosinophilia 11/27/2011  . Peripheral vascular disease ([place]) 09/29/2011  . Chronic kidney disease   . Tobacco abuse, in remission   . Chronic anticoagulation   . 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 Hyperlipidemia 01/01/2010  . Anemia 04/09/2009  . Hypertension 04/09/2009  . ATRIAL FIBRILLATION, CHRONIC 04/09/2009  Rayetta Humphrey, PT CLT [phone] 05/23/2018, 12:07 PM  [facility] [address] [place], [place], [address] Phone: [phone]   Fax:  [phone]  Name: COREAN YOSHIMURA MRN: [id number] Date of Birth: 10/17/1928

## 2018-05-24 ENCOUNTER — Ambulatory Visit (INDEPENDENT_AMBULATORY_CARE_PROVIDER_SITE_OTHER): Payer: Medicare Other | Admitting: Pharmacist

## 2018-05-24 DIAGNOSIS — I4891 Unspecified atrial fibrillation: Secondary | ICD-10-CM

## 2018-05-24 DIAGNOSIS — I749 Embolism and thrombosis of unspecified artery: Secondary | ICD-10-CM

## 2018-05-24 DIAGNOSIS — Z5181 Encounter for therapeutic drug level monitoring: Secondary | ICD-10-CM

## 2018-05-24 LAB — POCT INR: INR: 1.5 — AB (ref 2.0–3.0)

## 2018-05-24 NOTE — Patient Instructions (Signed)
Description   Take  6.5mg  (1 tablet of 4mg  and 1 tablet of 2.5mg ) today then increase dose to 4mg  daily except 2.5mg  on Wednesdays Recheck in 3 weeks

## 2018-05-25 ENCOUNTER — Ambulatory Visit (HOSPITAL_COMMUNITY): Payer: Medicare Other

## 2018-05-25 ENCOUNTER — Telehealth (HOSPITAL_COMMUNITY): Payer: Self-pay

## 2018-05-25 ENCOUNTER — Encounter (HOSPITAL_COMMUNITY): Payer: Self-pay

## 2018-05-25 DIAGNOSIS — S91002S Unspecified open wound, left ankle, sequela: Secondary | ICD-10-CM

## 2018-05-25 DIAGNOSIS — R601 Generalized edema: Secondary | ICD-10-CM

## 2018-05-25 NOTE — Therapy (Signed)
[place], [place], [address] Phone: [phone]   Fax:  [phone]  Wound Care Therapy  Patient Details  Name: [name] MRN: [id number] Date of Birth: [birth year] Referring Provider (PT): Maish Vaya   Encounter Date: 05/25/2018  PT End of Session - 05/25/18 1400    Visit Number  9    Number of Visits  12    Date for PT Re-Evaluation  06/09/18    Authorization Type  medicare    Authorization Time Period  Cert: 54/65-->10/08/1273    Authorization - Visit Number  9    Authorization - Number of Visits  10    PT Start Time  1700    PT Stop Time  1207    PT Time Calculation (min)  49 min    Activity Tolerance  Patient tolerated treatment well    Behavior During Therapy  [facility] for tasks assessed/performed       Past Medical History:  Diagnosis Date  . Anemia    Hemoglobin of 11.6 in 01/2010; 12.1 in 08/2010  . Atrial fibrillation ([place])    chronic requiring anticoagulation  . Chronic anticoagulation   . Chronic kidney disease    Stage III; creatinine of 1.7 in 04/2007 and 1.5 2009; 1.7 in 04/2010  . Hyperlipidemia    Total cholesterol of 226, triglycerides of 90, HDL of 49 and LDL of 159 in 01/2010  . Hypertension     Past Surgical History:  Procedure Laterality Date  . APPENDECTOMY    . BUNIONECTOMY  1995  . PARTIAL HYSTERECTOMY      There were no vitals filed for this visit.   Subjective Assessment - 05/25/18 1256    Subjective  Daughter reports bleeding through dressings and socks.  Still have not heard from the home health services.  Report return to blood thinner meds following MD apt and blood too thick    Currently in Pain?  Yes    Pain Score  2    faces   Pain Location  Ankle    Pain Orientation  Left                Wound Therapy - 05/25/18 1256    Subjective  Daughter reports bleeding through dressings and socks.  Still have not heard from the home health services.    Patient and Family Stated Goals  for wound to heal.     Date of Onset  04/22/18    Prior Treatments  self care, antibiotic at ER    Pain Scale  0-10    Evaluation and Treatment Procedures Explained to Patient/Family  Yes    Evaluation and Treatment Procedures  agreed to    Wound Properties Date First Assessed: 05/23/18 Time First Assessed: 1120 Wound Type: Other (Comment) Location: Ankle Location Orientation: Left;Anterior Wound Description (Comments): anterior aspect of LT ankle  Present on Admission: No   Dressing Type  Impregnated gauze (bismuth);Compression wrap   xeroform, alginate   Dressing Changed  Changed    Dressing Status  Clean;Dry;Intact    Dressing Change Frequency  PRN    Site / Wound Assessment  Clean;Dry    % Wound base Red or Granulating  30%    % Wound base Yellow/Fibrinous Exudate  70%    % Wound base Black/Eschar  0%    Wound Length (cm)  0.8 cm   was .9   Wound Width (cm)  0.3 cm   was .  3   Wound Depth (cm)  0 cm   was .2   Wound Volume (cm^3)  0 cm^3    Drainage Amount  Scant    Treatment  Cleansed;Debridement (Selective)    Wound Properties Date First Assessed: 04/28/18 Time First Assessed: 1440 Wound Type: Other (Comment) Location: Ankle Location Orientation: Left;Medial Wound Description (Comments): dark red not granulation, hypertrophic  Present on Admission: Yes   Dressing Type  Impregnated gauze (bismuth);Alginate;Compression wrap   xeroform, alginate and profore lite with toe wrap   Dressing Changed  Changed    Dressing Status  Old drainage    Dressing Change Frequency  PRN    Site / Wound Assessment  Granulation tissue    % Wound base Red or Granulating  95%    % Wound base Yellow/Fibrinous Exudate  5%    % Wound base Black/Eschar  0%    % Wound base Other/Granulation Tissue (Comment)  0%    Peri-wound Assessment  Intact;Edema    Wound Length (cm)  1.8 cm    Wound Width (cm)  2.2 cm   was 2.7   Wound Depth (cm)  0 cm    Wound Volume (cm^3)  0  cm^3    Wound Surface Area (cm^2)  3.96 cm^2    Margins  Attached edges (approximated)    Drainage Amount  Moderate    Drainage Description  Sanguineous    Treatment  Cleansed;Debridement (Selective)    Selective Debridement - Location  slough in woundbed     Selective Debridement - Tools Used  Forceps    Selective Debridement - Tissue Removed  devitalized tissue.  Wound bed if friable therefore debridement is limited.     Wound Therapy - Clinical Statement  Increased edema present without compression garment.  Improved granulation tissue wiht no depth or hypergranulation on medial wound.  Added alginate wiht xeroform on medial wound to address drainage and resume toe wrap and profore lite dressings for edema control.    Wound Therapy - Functional Problem List  wound bleeds very easily so it is difficult to don socks.     Factors Delaying/Impairing Wound Healing  Altered sensation;Infection - systemic/local;Immobility;Multiple medical problems;Polypharmacy    Hydrotherapy Plan  Debridement;Dressing change;Patient/family education    Wound Therapy - Frequency  2X / week    Wound Therapy - Current Recommendations  PT    Wound Plan  Continue cleanse, debride and moisture LE with appropriate compression dressings.      Dressing   xerofrom wiht alginate on medial wound with toe wrap and profore lite    Dressing  xeroform with anterior wound.                PT Short Term Goals - 05/11/18 1203      PT SHORT TERM GOAL #1   Title  PT daughter to be able to verbalize the sign and symptoms of cellulitis and the need to get medical attention immediatelyl.     Time  1    Period  Weeks    Status  Achieved      PT SHORT TERM GOAL #2   Title  Wound to decrease in size by 1.5 cm LEngth and width and no longer by hypergranulated     Time  3    Period  Weeks    Status  Partially Met        PT Long Term Goals - 05/11/18 1204      PT LONG TERM GOAL #  1   Title  PT wound to be 100%  granulated with healthy red tissue     Time  6    Period  Weeks    Status  On-going      PT LONG TERM GOAL #2   Title  PT wound size to be decreased to no greater than 2x2 cm to allow daughter to feel confident at self care of wound      Time  6    Period  Weeks    Status  On-going      PT LONG TERM GOAL #3   Title  PT to be able to don a sock without causing wound to bleed     Time  6    Period  Weeks    Status  On-going              Patient will benefit from skilled therapeutic intervention in order to improve the following deficits and impairments:     Visit Diagnosis: Ankle wound, left, sequela  Generalized edema     Problem List Patient Active Problem List   Diagnosis Date Noted  . Impaired gait and mobility 05/24/2017  . Weakness 04/28/2016  . Loss of weight 10/22/2013  . Encounter for therapeutic drug monitoring 06/04/2013  . Dementia ([place]) 05/18/2013  . UTI (urinary tract infection) 10/05/2012  . Chronic insomnia 06/30/2012  . Headache(784.0) 03/03/2012  . Eosinophilia 11/27/2011  . Peripheral vascular disease ([place]) 09/29/2011  . Chronic kidney disease   . Tobacco abuse, in remission   . Chronic anticoagulation   . 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 Hyperlipidemia 01/01/2010  . Anemia 04/09/2009  . Hypertension 04/09/2009  . ATRIAL FIBRILLATION, CHRONIC 04/09/2009   Ihor Austin, [place]; [place]  Aldona Lento 05/25/2018, 2:01 PM  [place], [place], [address] Phone: [phone]   Fax:  [phone]  Name: [name] MRN: [id number] Date of Birth: 03/30/1929

## 2018-05-25 NOTE — Telephone Encounter (Signed)
Initially a no show, called and spoke to daughter who stated she is getting ready to leave.   Daughter thought apt was at different time today.  Scheduled arranged to see pt at later time today.  [address], LPTA; CBIS [phone]

## 2018-05-30 ENCOUNTER — Ambulatory Visit (HOSPITAL_COMMUNITY): Payer: Medicare Other

## 2018-05-30 ENCOUNTER — Encounter (HOSPITAL_COMMUNITY): Payer: Self-pay

## 2018-05-30 DIAGNOSIS — S91002S Unspecified open wound, left ankle, sequela: Secondary | ICD-10-CM | POA: Diagnosis not present

## 2018-05-30 DIAGNOSIS — R601 Generalized edema: Secondary | ICD-10-CM | POA: Diagnosis not present

## 2018-05-30 NOTE — Therapy (Addendum)
[place], [place], [address] Phone: [phone]   Fax:  [phone]  Physical Therapy Treatment  Patient Details  Name: [name] MRN: [id number] Date of Birth: 07/06/1928 Referring Provider (PT): [facility]  Progress Note Reporting Period 12/27 to 05/30/2018  See note below for Objective Data and Assessment of Progress/Goals.      Encounter Date: 05/30/2018  PT End of Session - 05/30/18 1253    Visit Number  10    Number of Visits  12    Date for PT Re-Evaluation  06/09/18    Authorization Type  medicare    Authorization Time Period  Cert: 72/09-->08/07/960    Authorization - Visit Number  10    Authorization - Number of Visits  10    PT Start Time  1120    PT Stop Time  1158    PT Time Calculation (min)  38 min    Activity Tolerance  Patient tolerated treatment well    Behavior During Therapy  [facility] for tasks assessed/performed       Past Medical History:  Diagnosis Date  . Anemia    Hemoglobin of 11.6 in 01/2010; 12.1 in 08/2010  . Atrial fibrillation ([place])    chronic requiring anticoagulation  . Chronic anticoagulation   . Chronic kidney disease    Stage III; creatinine of 1.7 in 04/2007 and 1.5 2009; 1.7 in 04/2010  . Hyperlipidemia    Total cholesterol of 226, triglycerides of 90, HDL of 49 and LDL of 159 in 01/2010  . Hypertension     Past Surgical History:  Procedure Laterality Date  . APPENDECTOMY    . BUNIONECTOMY  1995  . PARTIAL HYSTERECTOMY      There were no vitals filed for this visit.  Subjective Assessment - 05/30/18 1206    Subjective  Dressings intact, reports some pain last night though none currently    Currently in Pain?  No/denies                     Wound Therapy - 05/30/18 1207    Subjective  Dressings intact, reports some pain last night though none currently    Patient and Family Stated Goals  for wound to heal.     Date of Onset  04/22/18     Prior Treatments  self care, antibiotic at ER    Pain Scale  0-10    Evaluation and Treatment Procedures Explained to Patient/Family  Yes    Evaluation and Treatment Procedures  agreed to    Wound Properties Date First Assessed: 05/23/18 Time First Assessed: 1120 Wound Type: Other (Comment) Location: Ankle Location Orientation: Left;Anterior Wound Description (Comments): anterior aspect of LT ankle  Present on Admission: No   Dressing Type  Impregnated gauze (bismuth);Compression wrap    Dressing Changed  Changed    Dressing Status  Clean;Dry;Intact    Dressing Change Frequency  PRN    Site / Wound Assessment  Clean;Dry    % Wound base Red or Granulating  40%    % Wound base Yellow/Fibrinous Exudate  60%    % Wound base Black/Eschar  0%    Wound Length (cm)  1 cm   Was not present   Wound Width (cm)  1 cm   was initially not present   Wound Surface Area (cm^2)  1 cm^2    Drainage Amount  Scant    Treatment  Cleansed;Debridement (Selective)  Wound Properties Date First Assessed: 04/28/18 Time First Assessed: 1440 Wound Type: Other (Comment) Location: Ankle Location Orientation: Left;Medial Wound Description (Comments): dark red not granulation, hypertrophic  Present on Admission: Yes   Dressing Type  Impregnated gauze (bismuth);Alginate;Compression wrap   xeroform profore lite   Dressing Changed  Changed    Dressing Status  Old drainage    Dressing Change Frequency  PRN    Site / Wound Assessment  Granulation tissue    % Wound base Red or Granulating  100%    % Wound base Yellow/Fibrinous Exudate  0%    Peri-wound Assessment  Intact;Edema    Wound Length (cm)  --    1.8 was initally 4 cm    Wound Width (cm)  --    2.2 was initially 6 cm    Wound Depth (cm)  --   lateral aspect hypergranulated approximatly .2cm was all hypergranulated   Margins  Attached edges (approximated)    Drainage Amount  Moderate    Drainage Description  Sanguineous    Treatment  Cleansed;Debridement  (Selective)    Selective Debridement - Location  slough in woundbed     Selective Debridement - Tools Used  Forceps;Scalpel    Selective Debridement - Tissue Removed  devitalized tissues and biofilm over anterior wound    Wound Therapy - Clinical Statement  Full granulation tissues on wound of medial aspect, noted beginning of hypergranulation so applied slight increased pressure with alginate dressings.  Anterior aspect wound increased in size wiht biofilm removed from wound bed.  Continued with profore lite to address edema, noted decreased edema with toes today.  No reoprts of pain through session.  Did call and leave message with MD's office pertaining towards beginning of home health following reports that daughter has heard nothing, awaiting call back    Wound Therapy - Functional Problem List  wound bleeds very easily so it is difficult to don socks.     Factors Delaying/Impairing Wound Healing  Altered sensation;Infection - systemic/local;Immobility;Multiple medical problems;Polypharmacy    Hydrotherapy Plan  Debridement;Dressing change;Patient/family education    Wound Therapy - Frequency  2X / week    Wound Therapy - Current Recommendations  PT    Wound Plan  Continue cleanse, debride and moisture LE with appropriate compression dressings.      Dressing   xerofrom wiht alginate on medial wound with toe wrap and profore lite    Dressing  xeroform with anterior wound.            PT Short Term Goals - 05/11/18 1203      PT SHORT TERM GOAL #1   Title  PT daughter to be able to verbalize the sign and symptoms of cellulitis and the need to get medical attention immediatelyl.     Time  1    Period  Weeks    Status  Achieved      PT SHORT TERM GOAL #2   Title  Wound to decrease in size by 1.5 cm LEngth and width and no longer by hypergranulated     Time  3    Period  Weeks    Status  Partially Met        PT Long Term Goals - 05/11/18 1204      PT LONG TERM GOAL #1   Title   PT wound to be 100% granulated with healthy red tissue     Time  6    Period  Weeks    Status  On-going      PT LONG TERM GOAL #2   Title  PT wound size to be decreased to no greater than 2x2 cm to allow daughter to feel confident at self care of wound      Time  6    Period  Weeks    Status  On-going      PT LONG TERM GOAL #3   Title  PT to be able to don a sock without causing wound to bleed     Time  6    Period  Weeks    Status  On-going              Patient will benefit from skilled therapeutic intervention in order to improve the following deficits and impairments:     Visit Diagnosis: Ankle wound, left, sequela  Generalized edema     Problem List Patient Active Problem List   Diagnosis Date Noted  . Impaired gait and mobility 05/24/2017  . Weakness 04/28/2016  . Loss of weight 10/22/2013  . Encounter for therapeutic drug monitoring 06/04/2013  . Dementia ([place]) 05/18/2013  . UTI (urinary tract infection) 10/05/2012  . Chronic insomnia 06/30/2012  . Headache(784.0) 03/03/2012  . Eosinophilia 11/27/2011  . Peripheral vascular disease ([place]) 09/29/2011  . Chronic kidney disease   . Tobacco abuse, in remission   . Chronic anticoagulation   . 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 Hyperlipidemia 01/01/2010  . Anemia 04/09/2009  . Hypertension 04/09/2009  . ATRIAL FIBRILLATION, CHRONIC 04/09/2009   Ihor Austin, [place]; [place]  Rayetta Humphrey, PT CLT [phone] 05/30/2018, 12:54 PM  [facility] [address] [place], [place], [address] Phone: [phone]   Fax:  [phone]  Name: VERNIDA MCNICHOLAS MRN: [id number] Date of Birth: Sep 26, 1928

## 2018-06-01 ENCOUNTER — Ambulatory Visit (HOSPITAL_COMMUNITY): Payer: Medicare Other | Admitting: Physical Therapy

## 2018-06-01 DIAGNOSIS — R601 Generalized edema: Secondary | ICD-10-CM | POA: Diagnosis not present

## 2018-06-01 DIAGNOSIS — S91002S Unspecified open wound, left ankle, sequela: Secondary | ICD-10-CM | POA: Diagnosis not present

## 2018-06-01 NOTE — Therapy (Signed)
[place], [place], [address] Phone: [phone]   Fax:  [phone]  Wound Care Therapy  Patient Details  Name: [name] MRN: [id number] Date of Birth: Jun 11, 1928 Referring Provider (PT): Luckey   Encounter Date: 06/01/2018  PT End of Session - 06/01/18 1259    Visit Number  11    Number of Visits  12    Date for PT Re-Evaluation  06/09/18    Authorization Type  medicare    Authorization Time Period  Cert: 53/29-->01/03/4267    Authorization - Visit Number  11    Authorization - Number of Visits  20    PT Start Time  1046    PT Stop Time  1125    PT Time Calculation (min)  39 min    Activity Tolerance  Patient tolerated treatment well    Behavior During Therapy  [facility] for tasks assessed/performed       Past Medical History:  Diagnosis Date  . Anemia    Hemoglobin of 11.6 in 01/2010; 12.1 in 08/2010  . Atrial fibrillation ([place])    chronic requiring anticoagulation  . Chronic anticoagulation   . Chronic kidney disease    Stage III; creatinine of 1.7 in 04/2007 and 1.5 2009; 1.7 in 04/2010  . Hyperlipidemia    Total cholesterol of 226, triglycerides of 90, HDL of 49 and LDL of 159 in 01/2010  . Hypertension     Past Surgical History:  Procedure Laterality Date  . APPENDECTOMY    . BUNIONECTOMY  1995  . PARTIAL HYSTERECTOMY      There were no vitals filed for this visit.              Wound Therapy - 06/01/18 1247    Subjective  Pt without complaints, dressing intact    Patient and Family Stated Goals  for wound to heal.     Date of Onset  04/22/18    Prior Treatments  self care, antibiotic at ER    Pain Scale  0-10    Evaluation and Treatment Procedures Explained to Patient/Family  Yes    Evaluation and Treatment Procedures  agreed to    Wound Properties Date First Assessed: 05/23/18 Time First Assessed: 1120 Wound Type: Other (Comment) Location: Ankle Location Orientation:  Left;Anterior Wound Description (Comments): anterior aspect of LT ankle  Present on Admission: No   Dressing Type  Impregnated gauze (bismuth);Compression wrap    Dressing Changed  Changed    Dressing Status  Clean;Dry;Intact    Dressing Change Frequency  PRN    Site / Wound Assessment  Clean;Dry    % Wound base Red or Granulating  70%    % Wound base Yellow/Fibrinous Exudate  30%    % Wound base Black/Eschar  0%    Drainage Amount  Scant    Treatment  Cleansed;Debridement (Selective)    Wound Properties Date First Assessed: 04/28/18 Time First Assessed: 1440 Wound Type: Other (Comment) Location: Ankle Location Orientation: Left;Medial Wound Description (Comments): dark red not granulation, hypertrophic  Present on Admission: Yes   Dressing Type  Impregnated gauze (bismuth);Alginate;Compression wrap   xeroform profore lite   Dressing Changed  Changed    Dressing Status  Old drainage    Dressing Change Frequency  PRN    Site / Wound Assessment  Granulation tissue    % Wound base Red or Granulating  100%    % Wound base Yellow/Fibrinous Exudate  0%    Peri-wound Assessment  Intact;Edema    Margins  Attached edges (approximated)    Drainage Amount  Minimal    Drainage Description  Sanguineous    Treatment  Cleansed;Debridement (Selective)    Selective Debridement - Location  slough in woundbed     Selective Debridement - Tools Used  Forceps    Selective Debridement - Tissue Removed  devitalized tissues and biofilm over anterior wound    Wound Therapy - Clinical Statement  Good granulation present in both wounds with ability to remove more slough from anterior wound.      Wound Therapy - Functional Problem List  wound bleeds very easily so it is difficult to don socks.     Factors Delaying/Impairing Wound Healing  Altered sensation;Infection - systemic/local;Immobility;Multiple medical problems;Polypharmacy    Hydrotherapy Plan  Debridement;Dressing change;Patient/family education     Wound Therapy - Frequency  2X / week    Wound Therapy - Current Recommendations  PT    Wound Plan  Continue cleanse, debride and moisture LE with appropriate compression dressings.      Dressing   xerofrom both wounds with toe wrap and profore lite    Dressing  --                PT Short Term Goals - 05/11/18 1203      PT SHORT TERM GOAL #1   Title  PT daughter to be able to verbalize the sign and symptoms of cellulitis and the need to get medical attention immediatelyl.     Time  1    Period  Weeks    Status  Achieved      PT SHORT TERM GOAL #2   Title  Wound to decrease in size by 1.5 cm LEngth and width and no longer by hypergranulated     Time  3    Period  Weeks    Status  Partially Met        PT Long Term Goals - 05/11/18 1204      PT LONG TERM GOAL #1   Title  PT wound to be 100% granulated with healthy red tissue     Time  6    Period  Weeks    Status  On-going      PT LONG TERM GOAL #2   Title  PT wound size to be decreased to no greater than 2x2 cm to allow daughter to feel confident at self care of wound      Time  6    Period  Weeks    Status  On-going      PT LONG TERM GOAL #3   Title  PT to be able to don a sock without causing wound to bleed     Time  6    Period  Weeks    Status  On-going              Patient will benefit from skilled therapeutic intervention in order to improve the following deficits and impairments:     Visit Diagnosis: Ankle wound, left, sequela  Generalized edema     Problem List Patient Active Problem List   Diagnosis Date Noted  . Impaired gait and mobility 05/24/2017  . Weakness 04/28/2016  . Loss of weight 10/22/2013  . Encounter for therapeutic drug monitoring 06/04/2013  . Dementia ([place]) 05/18/2013  . UTI (urinary tract infection) 10/05/2012  . Chronic insomnia 06/30/2012  . Headache(784.0) 03/03/2012  . Eosinophilia 11/27/2011  .  Peripheral vascular disease ([place]) 09/29/2011  . Chronic  kidney disease   . Tobacco abuse, in remission   . Chronic anticoagulation   . 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 Hyperlipidemia 01/01/2010  . Anemia 04/09/2009  . Hypertension 04/09/2009  . ATRIAL FIBRILLATION, CHRONIC 04/09/2009   Teena Irani, PTA/CLT [phone]  Teena Irani 06/01/2018, 1:01 PM  [facility] [address] [place], [place], [address] Phone: [phone]   Fax:  [phone]  Name: [name] MRN: [id number] Date of Birth: 12/06/1928

## 2018-06-06 ENCOUNTER — Encounter (HOSPITAL_COMMUNITY): Payer: Self-pay

## 2018-06-06 ENCOUNTER — Ambulatory Visit (HOSPITAL_COMMUNITY): Payer: Medicare Other | Attending: Family Medicine

## 2018-06-06 DIAGNOSIS — R601 Generalized edema: Secondary | ICD-10-CM | POA: Diagnosis not present

## 2018-06-06 DIAGNOSIS — S91002S Unspecified open wound, left ankle, sequela: Secondary | ICD-10-CM | POA: Insufficient documentation

## 2018-06-06 NOTE — Addendum Note (Signed)
Addended by: Bella Kennedy on: 06/06/2018 04:12 PM   Modules accepted: Orders

## 2018-06-06 NOTE — Therapy (Addendum)
[place], [place], [address] Phone: [phone]   Fax:  [phone]  Wound Care Therapy  Patient Details  Name: [name] MRN: [id number] Date of Birth: [birth year] Referring Provider (PT): [place]   Encounter Date: 06/06/2018  PT End of Session - 06/06/18 1227    Visit Number  12    Number of Visits  20   Date for PT Re-Evaluation  06/09/18    Authorization Type  medicare    Authorization Time Period  Cert: 2/4 thru 3/4     Authorization - Visit Number  12    Authorization - Number of Visits  20    PT Start Time  9166    PT Stop Time  1205    PT Time Calculation (min)  42 min    Activity Tolerance  Patient tolerated treatment well    Behavior During Therapy  [facility] for tasks assessed/performed       Past Medical History:  Diagnosis Date  . Anemia    Hemoglobin of 11.6 in 01/2010; 12.1 in 08/2010  . Atrial fibrillation ([place])    chronic requiring anticoagulation  . Chronic anticoagulation   . Chronic kidney disease    Stage III; creatinine of 1.7 in 04/2007 and 1.5 2009; 1.7 in 04/2010  . Hyperlipidemia    Total cholesterol of 226, triglycerides of 90, HDL of 49 and LDL of 159 in 01/2010  . Hypertension     Past Surgical History:  Procedure Laterality Date  . APPENDECTOMY    . BUNIONECTOMY  1995  . PARTIAL HYSTERECTOMY      There were no vitals filed for this visit.   Subjective Assessment - 06/06/18 1215    Subjective  Dressings intact, no reports of pain.  Daughter currently in hospital due to acid reflex problems    Patient is accompained by:  Family member   son   Currently in Pain?  No/denies                Wound Therapy - 06/06/18 1216    Subjective  Dressings intact, no reports of pain.  Daughter currently in hospital due to acid reflex problems    Patient and Family Stated Goals  for wound to heal.     Date of Onset  04/22/18    Prior Treatments  self care, antibiotic  at ER    Pain Scale  0-10    Pain Score  0-No pain    Evaluation and Treatment Procedures Explained to Patient/Family  Yes    Evaluation and Treatment Procedures  agreed to    Wound Properties Date First Assessed: 05/23/18 Time First Assessed: 1120 Wound Type: Other (Comment) Location: Ankle Location Orientation: Left;Anterior Wound Description (Comments): anterior aspect of LT ankle  Present on Admission: No   Dressing Type  Impregnated gauze (bismuth);Alginate;Compression wrap    Dressing Changed  New    Dressing Status  Clean;Dry;Intact    Dressing Change Frequency  PRN    Site / Wound Assessment  Clean;Dry    % Wound base Red or Granulating  90%    % Wound base Yellow/Fibrinous Exudate  10%    % Wound base Black/Eschar  0%    Wound Length (cm)  0.8 cm   was 1   Wound Width (cm)  0.8 cm   was 1   Wound Depth (cm)  0 cm    Wound Volume (cm^3)  0 cm^3  Wound Surface Area (cm^2)  0.64 cm^2    Drainage Amount  Scant    Treatment  Cleansed;Debridement (Selective)    Wound Properties Date First Assessed: 04/28/18 Time First Assessed: 1440 Wound Type: Other (Comment) Location: Ankle Location Orientation: Left;Medial Wound Description (Comments): dark red not granulation, hypertrophic  Present on Admission: Yes   Dressing Type  Impregnated gauze (bismuth);Alginate;Compression wrap    Dressing Changed  Changed    Dressing Status  Old drainage    Dressing Change Frequency  PRN    Site / Wound Assessment  Granulation tissue    % Wound base Red or Granulating  100%    % Wound base Yellow/Fibrinous Exudate  0%    % Wound base Black/Eschar  0%    Peri-wound Assessment  Intact;Edema    Wound Length (cm)  1.6 cm   was 1.8   Wound Width (cm)  1.2 cm   was 2.2   Wound Depth (cm)  0 cm   hypergranulated   Wound Volume (cm^3)  0 cm^3    Wound Surface Area (cm^2)  1.92 cm^2    Margins  Attached edges (approximated)    Drainage Amount  Minimal    Drainage Description  Sanguineous     Treatment  Cleansed;Debridement (Selective)    Wound Properties Date First Assessed: 06/06/18 Time First Assessed: 1134 Wound Type: Other (Comment) Location: Ankle Location Orientation: Left;Medial;Posterior   Dressing Type  Impregnated gauze (bismuth);Alginate;Compression wrap   xeroform, alginate, profore lite   Dressing Changed  Changed    Dressing Status  Clean;Dry;Intact    Dressing Change Frequency  PRN    Site / Wound Assessment  Clean;Granulation tissue;Red    % Wound base Red or Granulating  90%    % Wound base Yellow/Fibrinous Exudate  10%    % Wound base Black/Eschar  0%    Wound Length (cm)  0.3 cm    Wound Width (cm)  0.2 cm    Wound Depth (cm)  --   unknown   Wound Surface Area (cm^2)  0.06 cm^2    Drainage Amount  Scant    Drainage Description  Serosanguineous    Treatment  Cleansed;Debridement (Selective)    Selective Debridement - Location  slough in woundbed     Selective Debridement - Tools Used  Forceps    Selective Debridement - Tissue Removed  devitalized tissues and biofilm over anterior wound    Wound Therapy - Clinical Statement  Pt presents with decreased overall edema and  improved granulation 100% lateral wound and 90% anterior wound.  Noted new wound on posterior aspect of medial wound, no debridement just cleansed and covered as it is next to initial care wound.  No reports of pain through session.  COntinued with xeroform, alginate and profore lite with toe wrap and netting.  PT will need continued skilled care due to complications of immobility and multiple medical problems.    Wound Therapy - Functional Problem List  wound bleeds very easily so it is difficult to don socks.     Factors Delaying/Impairing Wound Healing  Altered sensation;Infection - systemic/local;Immobility;Multiple medical problems;Polypharmacy    Hydrotherapy Plan  Debridement;Dressing change;Patient/family education    Wound Therapy - Frequency  2X / week  For 10 week period    Wound  Therapy - Current Recommendations  PT    Wound Plan  Ask family if pt has a multi podus boot.  Continue cleanse, debride and moisture LE with appropriate compression dressings.  Dressing   xeroform with alginate, toe wrap and profore lite                PT Short Term Goals - 05/11/18 1203      PT SHORT TERM GOAL #1   Title  PT daughter to be able to verbalize the sign and symptoms of cellulitis and the need to get medical attention immediatelyl.     Time  1    Period  Weeks    Status  Achieved      PT SHORT TERM GOAL #2   Title  Wound to decrease in size by 1.5 cm LEngth and width and no longer by hypergranulated     Time  3    Period  Weeks    Status  Partially Met        PT Long Term Goals - 05/11/18 1204      PT LONG TERM GOAL #1   Title  PT wound to be 100% granulated with healthy red tissue     Time  6    Period  Weeks    Status  On-going      PT LONG TERM GOAL #2   Title  PT wound size to be decreased to no greater than 2x2 cm to allow daughter to feel confident at self care of wound      Time  6    Period  Weeks    Status  On-going      PT LONG TERM GOAL #3   Title  PT to be able to don a sock without causing wound to bleed     Time  6    Period  Weeks    Status  On-going              Patient will benefit from skilled therapeutic intervention in order to improve the following deficits and impairments:     Visit Diagnosis: Ankle wound, left, sequela  Generalized edema     Problem List Patient Active Problem List   Diagnosis Date Noted  . Impaired gait and mobility 05/24/2017  . Weakness 04/28/2016  . Loss of weight 10/22/2013  . Encounter for therapeutic drug monitoring 06/04/2013  . Dementia ([place]) 05/18/2013  . UTI (urinary tract infection) 10/05/2012  . Chronic insomnia 06/30/2012  . Headache(784.0) 03/03/2012  . Eosinophilia 11/27/2011  . Peripheral vascular disease ([place]) 09/29/2011  . Chronic kidney disease   . Tobacco  abuse, in remission   . Chronic anticoagulation   . 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 Hyperlipidemia 01/01/2010  . Anemia 04/09/2009  . Hypertension 04/09/2009  . ATRIAL FIBRILLATION, CHRONIC 04/09/2009   Ihor Austin, [place]; [place] Rayetta Humphrey, PT CLT [phone] 06/06/2018, 12:29 PM  [facility] [address] [place], [place], [address] Phone: [phone]   Fax:  [phone]  Name: [name] MRN: [id number] Date of Birth: 12-18-1928

## 2018-06-08 ENCOUNTER — Ambulatory Visit (HOSPITAL_COMMUNITY): Payer: Medicare Other | Admitting: Physical Therapy

## 2018-06-08 DIAGNOSIS — S91002S Unspecified open wound, left ankle, sequela: Secondary | ICD-10-CM

## 2018-06-08 DIAGNOSIS — R601 Generalized edema: Secondary | ICD-10-CM | POA: Diagnosis not present

## 2018-06-08 NOTE — Therapy (Signed)
[place], [place], [address] Phone: [phone]   Fax:  [phone]  Wound Care Therapy  Patient Details  Name: [name] MRN: [id number] Date of Birth: 1928-06-26 Referring Provider (PT): [place]   Encounter Date: 06/08/2018  PT End of Session - 06/08/18 1645    Visit Number  13    Number of Visits  20    Date for PT Re-Evaluation  07/06/18    Authorization Type  medicare    Authorization Time Period  2/4/ thru 3/4    Authorization - Visit Number  13    Authorization - Number of Visits  20    PT Start Time  1132    PT Stop Time  1205    PT Time Calculation (min)  33 min    Activity Tolerance  Patient tolerated treatment well    Behavior During Therapy  [facility] for tasks assessed/performed       Past Medical History:  Diagnosis Date  . Anemia    Hemoglobin of 11.6 in 01/2010; 12.1 in 08/2010  . Atrial fibrillation ([place])    chronic requiring anticoagulation  . Chronic anticoagulation   . Chronic kidney disease    Stage III; creatinine of 1.7 in 04/2007 and 1.5 2009; 1.7 in 04/2010  . Hyperlipidemia    Total cholesterol of 226, triglycerides of 90, HDL of 49 and LDL of 159 in 01/2010  . Hypertension     Past Surgical History:  Procedure Laterality Date  . APPENDECTOMY    . BUNIONECTOMY  1995  . PARTIAL HYSTERECTOMY      There were no vitals filed for this visit.              Wound Therapy - 06/08/18 1636    Subjective  pt doing well today without complaints.      Patient and Family Stated Goals  for wound to heal.     Date of Onset  04/22/18    Prior Treatments  self care, antibiotic at ER    Pain Scale  0-10    Pain Score  0-No pain    Evaluation and Treatment Procedures Explained to Patient/Family  Yes    Evaluation and Treatment Procedures  agreed to    Wound Properties Date First Assessed: 05/23/18 Time First Assessed: 1120 Wound Type: Other (Comment) Location: Ankle  Location Orientation: Left;Anterior Wound Description (Comments): anterior aspect of LT ankle  Present on Admission: No   Dressing Type  Impregnated gauze (bismuth);Alginate;Compression wrap    Dressing Changed  Changed    Dressing Status  Clean;Dry;Intact    Dressing Change Frequency  PRN    Site / Wound Assessment  Clean;Dry    % Wound base Red or Granulating  100%    % Wound base Yellow/Fibrinous Exudate  10%    % Wound base Black/Eschar  0%    Wound Length (cm)  0.7 cm    Wound Width (cm)  0.7 cm    Wound Depth (cm)  0 cm    Wound Volume (cm^3)  0 cm^3    Wound Surface Area (cm^2)  0.49 cm^2    Drainage Amount  Scant    Treatment  Cleansed;Debridement (Selective)    Wound Properties Date First Assessed: 04/28/18 Time First Assessed: 1440 Wound Type: Other (Comment) Location: Ankle Location Orientation: Left;Medial Wound Description (Comments): dark red not granulation, hypertrophic  Present on Admission: Yes   Dressing Type  Impregnated gauze (bismuth);Alginate;Compression  wrap    Dressing Changed  Changed    Dressing Status  Old drainage    Dressing Change Frequency  PRN    Site / Wound Assessment  Granulation tissue    % Wound base Red or Granulating  100%   hypergranluation present   % Wound base Yellow/Fibrinous Exudate  0%    % Wound base Black/Eschar  0%    Peri-wound Assessment  Intact;Edema    Wound Length (cm)  1.6 cm    Wound Width (cm)  1.2 cm    Wound Depth (cm)  0 cm    Wound Volume (cm^3)  0 cm^3    Wound Surface Area (cm^2)  1.92 cm^2    Margins  Attached edges (approximated)    Drainage Amount  Minimal    Drainage Description  Sanguineous    Treatment  Cleansed;Debridement (Selective)    Wound Properties Date First Assessed: 06/06/18 Time First Assessed: 1134 Wound Type: Other (Comment) Location: Ankle Location Orientation: Left;Medial;Posterior   Dressing Type  Impregnated gauze (bismuth);Alginate;Compression wrap   xeroform, alginate, profore lite    Dressing Changed  Changed    Dressing Status  Clean;Dry;Intact    Dressing Change Frequency  PRN    Site / Wound Assessment  Clean;Granulation tissue;Red    % Wound base Red or Granulating  90%    % Wound base Yellow/Fibrinous Exudate  10%    % Wound base Black/Eschar  0%    Wound Length (cm)  0.2 cm    Wound Width (cm)  0.2 cm    Wound Depth (cm)  0.2 cm    Wound Volume (cm^3)  0.01 cm^3    Wound Surface Area (cm^2)  0.04 cm^2    Drainage Amount  Scant    Drainage Description  Serosanguineous    Treatment  Cleansed    Selective Debridement - Location  slough in woundbed devitalized tissue perimeter of wounds    Selective Debridement - Tools Used  Forceps    Selective Debridement - Tissue Removed  devitalized tissues and biofilm over anterior wound    Wound Therapy - Clinical Statement  Wounds remeasured this sessio with overall reduction in anterior ankle and lateral ankle; no change in larger wound.  LArger wound also with hyper granluation; added foam to help compress and reduce hypergranluation to promote approximation.  Discussed Home health with CG and states she would like for her to continue in outpatinet.      Wound Therapy - Functional Problem List  wound bleeds very easily so it is difficult to don socks.     Factors Delaying/Impairing Wound Healing  Altered sensation;Infection - systemic/local;Immobility;Multiple medical problems;Polypharmacy    Hydrotherapy Plan  Debridement;Dressing change;Patient/family education    Wound Therapy - Frequency  2X / week    Wound Therapy - Current Recommendations  PT    Wound Plan  Continue cleanse, debride and moisture LE with appropriate compression dressings.      Dressing   xeroform with alginate, toe wrap and profore lite                PT Short Term Goals - 05/11/18 1203      PT SHORT TERM GOAL #1   Title  PT daughter to be able to verbalize the sign and symptoms of cellulitis and the need to get medical attention  immediatelyl.     Time  1    Period  Weeks    Status  Achieved  PT SHORT TERM GOAL #2   Title  Wound to decrease in size by 1.5 cm LEngth and width and no longer by hypergranulated     Time  3    Period  Weeks    Status  Partially Met        PT Long Term Goals - 05/11/18 1204      PT LONG TERM GOAL #1   Title  PT wound to be 100% granulated with healthy red tissue     Time  6    Period  Weeks    Status  On-going      PT LONG TERM GOAL #2   Title  PT wound size to be decreased to no greater than 2x2 cm to allow daughter to feel confident at self care of wound      Time  6    Period  Weeks    Status  On-going      PT LONG TERM GOAL #3   Title  PT to be able to don a sock without causing wound to bleed     Time  6    Period  Weeks    Status  On-going              Patient will benefit from skilled therapeutic intervention in order to improve the following deficits and impairments:     Visit Diagnosis: Ankle wound, left, sequela  Generalized edema     Problem List Patient Active Problem List   Diagnosis Date Noted  . Impaired gait and mobility 05/24/2017  . Weakness 04/28/2016  . Loss of weight 10/22/2013  . Encounter for therapeutic drug monitoring 06/04/2013  . Dementia ([place]) 05/18/2013  . UTI (urinary tract infection) 10/05/2012  . Chronic insomnia 06/30/2012  . Headache(784.0) 03/03/2012  . Eosinophilia 11/27/2011  . Peripheral vascular disease ([place]) 09/29/2011  . Chronic kidney disease   . Tobacco abuse, in remission   . Chronic anticoagulation   . 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 Hyperlipidemia 01/01/2010  . Anemia 04/09/2009  . Hypertension 04/09/2009  . ATRIAL FIBRILLATION, CHRONIC 04/09/2009   Teena Irani, PTA/CLT [phone]  Teena Irani 06/08/2018, 4:46 PM  [facility] [address] [place], [place], [address] Phone: [phone]   Fax:   [phone]  Name: [name] MRN: [id number] Date of Birth: Sep 08, 1928

## 2018-06-13 ENCOUNTER — Ambulatory Visit (HOSPITAL_COMMUNITY): Payer: Medicare Other | Admitting: Physical Therapy

## 2018-06-13 DIAGNOSIS — R601 Generalized edema: Secondary | ICD-10-CM

## 2018-06-13 DIAGNOSIS — S91002S Unspecified open wound, left ankle, sequela: Secondary | ICD-10-CM | POA: Diagnosis not present

## 2018-06-13 NOTE — Therapy (Signed)
[place], [place], [address] Phone: [phone]   Fax:  [phone]  Wound Care Therapy  Patient Details  Name: [name] MRN: [id number] Date of Birth: 04/12/1929 Referring Provider (PT): [place]   Encounter Date: 06/13/2018  PT End of Session - 06/13/18 1845    Visit Number  14    Number of Visits  20    Date for PT Re-Evaluation  07/06/18    Authorization Type  medicare    Authorization Time Period  2/4/ thru 3/4    Authorization - Visit Number  14    Authorization - Number of Visits  20    PT Start Time  1127    PT Stop Time  1155    PT Time Calculation (min)  28 min    Activity Tolerance  Patient tolerated treatment well    Behavior During Therapy  [facility] for tasks assessed/performed       Past Medical History:  Diagnosis Date  . Anemia    Hemoglobin of 11.6 in 01/2010; 12.1 in 08/2010  . Atrial fibrillation ([place])    chronic requiring anticoagulation  . Chronic anticoagulation   . Chronic kidney disease    Stage III; creatinine of 1.7 in 04/2007 and 1.5 2009; 1.7 in 04/2010  . Hyperlipidemia    Total cholesterol of 226, triglycerides of 90, HDL of 49 and LDL of 159 in 01/2010  . Hypertension     Past Surgical History:  Procedure Laterality Date  . APPENDECTOMY    . BUNIONECTOMY  1995  . PARTIAL HYSTERECTOMY      There were no vitals filed for this visit.              Wound Therapy - 06/13/18 1846    Subjective  dressings dry and intact, no issues.    Patient and Family Stated Goals  for wound to heal.     Date of Onset  04/22/18    Prior Treatments  self care, antibiotic at ER    Evaluation and Treatment Procedures Explained to Patient/Family  Yes    Evaluation and Treatment Procedures  agreed to    Wound Properties Date First Assessed: 05/23/18 Time First Assessed: 1120 Wound Type: Other (Comment) Location: Ankle Location Orientation: Left;Anterior Wound Description  (Comments): anterior aspect of LT ankle  Present on Admission: No   Dressing Type  Impregnated gauze (bismuth);Alginate;Compression wrap    Dressing Changed  Changed    Dressing Status  Clean;Dry;Intact    Dressing Change Frequency  PRN    Site / Wound Assessment  Clean;Dry    % Wound base Red or Granulating  100%    % Wound base Yellow/Fibrinous Exudate  10%    % Wound base Black/Eschar  0%    Wound Length (cm)  0.5 cm    Wound Width (cm)  0.5 cm    Wound Depth (cm)  0 cm    Wound Volume (cm^3)  0 cm^3    Wound Surface Area (cm^2)  0.25 cm^2    Drainage Amount  Scant    Treatment  Cleansed;Debridement (Selective)    Wound Properties Date First Assessed: 04/28/18 Time First Assessed: 1440 Wound Type: Other (Comment) Location: Ankle Location Orientation: Left;Medial Wound Description (Comments): dark red not granulation, hypertrophic  Present on Admission: Yes   Dressing Type  Impregnated gauze (bismuth);Alginate;Compression wrap    Dressing Changed  New    Dressing Status  Old drainage  Dressing Change Frequency  PRN    Site / Wound Assessment  Granulation tissue    % Wound base Red or Granulating  100%   hypergranluation present   % Wound base Yellow/Fibrinous Exudate  0%    % Wound base Black/Eschar  0%    Peri-wound Assessment  Intact;Edema    Wound Length (cm)  1 cm    Wound Width (cm)  2 cm    Wound Depth (cm)  0 cm    Wound Volume (cm^3)  0 cm^3    Wound Surface Area (cm^2)  2 cm^2    Margins  Attached edges (approximated)    Drainage Amount  Minimal    Drainage Description  Sanguineous    Treatment  Cleansed    Wound Properties Date First Assessed: 06/06/18 Time First Assessed: 1134 Wound Type: Other (Comment) Location: Ankle Location Orientation: Left;Medial;Posterior   Dressing Type  Impregnated gauze (bismuth);Alginate;Compression wrap   xeroform, alginate, profore lite   Dressing Changed  Changed    Dressing Status  Clean;Dry;Intact    Dressing Change Frequency   PRN    Site / Wound Assessment  Clean;Granulation tissue;Red    % Wound base Red or Granulating  100%    % Wound base Yellow/Fibrinous Exudate  0%    % Wound base Black/Eschar  0%    Wound Length (cm)  0.1 cm    Wound Width (cm)  0.1 cm    Wound Depth (cm)  0.1 cm    Wound Volume (cm^3)  0 cm^3    Wound Surface Area (cm^2)  0.01 cm^2    Drainage Amount  Scant    Drainage Description  Serosanguineous    Treatment  Cleansed;Debridement (Selective)    Selective Debridement - Location  devitalized tissue perimeter of wound and borders    Selective Debridement - Tools Used  Forceps    Selective Debridement - Tissue Removed  devitalized tissues and biofilm over anterior wound    Wound Therapy - Clinical Statement  wounds with noted improvement this session with increased granulation, reduced size and reduced hypergranulation of medial, more larger wound.  this wound also with islands of healing within.  cleansed and moisturized well, removing dry skin perinmeter of wounds.  Profore lite and continued use of foam to cushion anterior ankle and provide compression to reduce hypergranluation. Toe wraps not utilized this session as patient reports it made her toes sore.    Wound Therapy - Functional Problem List  wound bleeds very easily so it is difficult to don socks.     Factors Delaying/Impairing Wound Healing  Altered sensation;Infection - systemic/local;Immobility;Multiple medical problems;Polypharmacy    Hydrotherapy Plan  Debridement;Dressing change;Patient/family education    Wound Therapy - Frequency  2X / week    Wound Therapy - Current Recommendations  PT    Wound Plan  Continue cleanse, debride and moisture LE with appropriate compression dressings.      Dressing   xeroform with alginate, and profore lite                PT Short Term Goals - 05/11/18 1203      PT SHORT TERM GOAL #1   Title  PT daughter to be able to verbalize the sign and symptoms of cellulitis and the need  to get medical attention immediatelyl.     Time  1    Period  Weeks    Status  Achieved      PT SHORT TERM GOAL #2  Title  Wound to decrease in size by 1.5 cm LEngth and width and no longer by hypergranulated     Time  3    Period  Weeks    Status  Partially Met        PT Long Term Goals - 05/11/18 1204      PT LONG TERM GOAL #1   Title  PT wound to be 100% granulated with healthy red tissue     Time  6    Period  Weeks    Status  On-going      PT LONG TERM GOAL #2   Title  PT wound size to be decreased to no greater than 2x2 cm to allow daughter to feel confident at self care of wound      Time  6    Period  Weeks    Status  On-going      PT LONG TERM GOAL #3   Title  PT to be able to don a sock without causing wound to bleed     Time  6    Period  Weeks    Status  On-going              Patient will benefit from skilled therapeutic intervention in order to improve the following deficits and impairments:     Visit Diagnosis: Ankle wound, left, sequela  Generalized edema     Problem List Patient Active Problem List   Diagnosis Date Noted  . Impaired gait and mobility 05/24/2017  . Weakness 04/28/2016  . Loss of weight 10/22/2013  . Encounter for therapeutic drug monitoring 06/04/2013  . Dementia ([place]) 05/18/2013  . UTI (urinary tract infection) 10/05/2012  . Chronic insomnia 06/30/2012  . Headache(784.0) 03/03/2012  . Eosinophilia 11/27/2011  . Peripheral vascular disease ([place]) 09/29/2011  . Chronic kidney disease   . Tobacco abuse, in remission   . Chronic anticoagulation   . 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 Hyperlipidemia 01/01/2010  . Anemia 04/09/2009  . Hypertension 04/09/2009  . ATRIAL FIBRILLATION, CHRONIC 04/09/2009    Teena Irani 06/13/2018, 6:53 PM  [facility] [address] [place], [place], [address] Phone: [phone]   Fax:  [phone]  Name:  DEMONI PARMAR MRN: [id number] Date of Birth: April 13, 1929

## 2018-06-15 ENCOUNTER — Ambulatory Visit (HOSPITAL_COMMUNITY): Payer: Medicare Other | Admitting: Physical Therapy

## 2018-06-15 DIAGNOSIS — R601 Generalized edema: Secondary | ICD-10-CM

## 2018-06-15 DIAGNOSIS — S91002S Unspecified open wound, left ankle, sequela: Secondary | ICD-10-CM | POA: Diagnosis not present

## 2018-06-15 NOTE — Therapy (Signed)
[name], [place], [address] Phone: [phone]   Fax:  [phone]  Wound Care Therapy  Patient Details  Name: [name] MRN: [id number] Date of Birth: [birth year] Referring Provider (PT): [place]   Encounter Date: 06/15/2018  PT End of Session - 06/15/18 1258    Visit Number  15    Number of Visits  20    Date for PT Re-Evaluation  07/06/18    Authorization Type  medicare    Authorization Time Period  2/4/ thru 3/4    Authorization - Visit Number  15    Authorization - Number of Visits  20    PT Start Time  1128    PT Stop Time  1200    PT Time Calculation (min)  32 min    Activity Tolerance  Patient tolerated treatment well    Behavior During Therapy  [facility] for tasks assessed/performed       Past Medical History:  Diagnosis Date  . Anemia    Hemoglobin of 11.6 in 01/2010; 12.1 in 08/2010  . Atrial fibrillation ([place])    chronic requiring anticoagulation  . Chronic anticoagulation   . Chronic kidney disease    Stage III; creatinine of 1.7 in 04/2007 and 1.5 2009; 1.7 in 04/2010  . Hyperlipidemia    Total cholesterol of 226, triglycerides of 90, HDL of 49 and LDL of 159 in 01/2010  . Hypertension     Past Surgical History:  Procedure Laterality Date  . APPENDECTOMY    . BUNIONECTOMY  1995  . PARTIAL HYSTERECTOMY      There were no vitals filed for this visit.              Wound Therapy - 06/15/18 1255    Subjective  no issues/pain.    Patient and Family Stated Goals  for wound to heal.     Date of Onset  04/22/18    Prior Treatments  self care, antibiotic at ER    Evaluation and Treatment Procedures Explained to Patient/Family  Yes    Evaluation and Treatment Procedures  agreed to    Wound Properties Date First Assessed: 05/23/18 Time First Assessed: 1120 Wound Type: Other (Comment) Location: Ankle Location Orientation: Left;Anterior Wound Description (Comments): anterior  aspect of LT ankle  Present on Admission: No   Dressing Type  Impregnated gauze (bismuth);Alginate;Compression wrap    Dressing Changed  Changed    Dressing Status  Clean;Dry;Intact    Dressing Change Frequency  PRN    Site / Wound Assessment  Clean;Dry    % Wound base Red or Granulating  100%    % Wound base Yellow/Fibrinous Exudate  10%    % Wound base Black/Eschar  0%    Drainage Amount  Scant    Treatment  Cleansed;Debridement (Selective)    Wound Properties Date First Assessed: 04/28/18 Time First Assessed: 1440 Wound Type: Other (Comment) Location: Ankle Location Orientation: Left;Medial Wound Description (Comments): dark red not granulation, hypertrophic  Present on Admission: Yes   Dressing Type  Impregnated gauze (bismuth);Alginate;Compression wrap    Dressing Changed  Changed    Dressing Status  Old drainage    Dressing Change Frequency  PRN    Site / Wound Assessment  Granulation tissue    % Wound base Red or Granulating  100%   hypergranluation present   % Wound base Yellow/Fibrinous Exudate  0%    % Wound base Black/Eschar  0%  Peri-wound Assessment  Intact;Edema    Margins  Attached edges (approximated)    Drainage Amount  Minimal    Drainage Description  Sanguineous    Treatment  Cleansed;Debridement (Selective)    Wound Properties Date First Assessed: 06/06/18 Time First Assessed: 1134 Wound Type: Other (Comment) Location: Ankle Location Orientation: Left;Medial;Posterior   Dressing Type  Impregnated gauze (bismuth);Alginate;Compression wrap   xeroform, alginate, profore lite   Dressing Changed  Changed    Dressing Status  Dry;Intact    Dressing Change Frequency  PRN    Site / Wound Assessment  Clean;Granulation tissue;Red    % Wound base Red or Granulating  100%    % Wound base Yellow/Fibrinous Exudate  0%    % Wound base Black/Eschar  0%    Drainage Amount  Scant    Drainage Description  Serosanguineous    Treatment  Cleansed;Debridement (Selective)     Selective Debridement - Location  devitalized tissue perimeter of wound and borders    Selective Debridement - Tools Used  Forceps    Selective Debridement - Tissue Removed  devitalized tissues and biofilm over anterior wound    Wound Therapy - Clinical Statement  wounds conitnue to approximate and granulate needing less debridement and care.  Foam has really helped to approximate medial wound and keep creases from anterior one.  conitnued with xeroform and profore lite.      Wound Therapy - Functional Problem List  wound bleeds very easily so it is difficult to don socks.     Factors Delaying/Impairing Wound Healing  Altered sensation;Infection - systemic/local;Immobility;Multiple medical problems;Polypharmacy    Hydrotherapy Plan  Debridement;Dressing change;Patient/family education    Wound Therapy - Frequency  2X / week    Wound Therapy - Current Recommendations  PT    Wound Plan  Continue cleanse, debride and moisture LE with appropriate compression dressings.      Dressing   xeroform, profore lite                PT Short Term Goals - 05/11/18 1203      PT SHORT TERM GOAL #1   Title  PT daughter to be able to verbalize the sign and symptoms of cellulitis and the need to get medical attention immediatelyl.     Time  1    Period  Weeks    Status  Achieved      PT SHORT TERM GOAL #2   Title  Wound to decrease in size by 1.5 cm LEngth and width and no longer by hypergranulated     Time  3    Period  Weeks    Status  Partially Met        PT Long Term Goals - 05/11/18 1204      PT LONG TERM GOAL #1   Title  PT wound to be 100% granulated with healthy red tissue     Time  6    Period  Weeks    Status  On-going      PT LONG TERM GOAL #2   Title  PT wound size to be decreased to no greater than 2x2 cm to allow daughter to feel confident at self care of wound      Time  6    Period  Weeks    Status  On-going      PT LONG TERM GOAL #3   Title  PT to be able to don a  sock without causing wound to bleed  Time  6    Period  Weeks    Status  On-going              Patient will benefit from skilled therapeutic intervention in order to improve the following deficits and impairments:     Visit Diagnosis: Ankle wound, left, sequela  Generalized edema     Problem List Patient Active Problem List   Diagnosis Date Noted  . Impaired gait and mobility 05/24/2017  . Weakness 04/28/2016  . Loss of weight 10/22/2013  . Encounter for therapeutic drug monitoring 06/04/2013  . Dementia ([place]) 05/18/2013  . UTI (urinary tract infection) 10/05/2012  . Chronic insomnia 06/30/2012  . Headache(784.0) 03/03/2012  . Eosinophilia 11/27/2011  . Peripheral vascular disease ([place]) 09/29/2011  . Chronic kidney disease   . Tobacco abuse, in remission   . Chronic anticoagulation   . 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 Hyperlipidemia 01/01/2010  . Anemia 04/09/2009  . Hypertension 04/09/2009  . ATRIAL FIBRILLATION, CHRONIC 04/09/2009   Teena Irani, PTA/CLT [phone]  Teena Irani 06/15/2018, 1:00 PM  [facility], [place], [address] Phone: [phone]   Fax:  [phone]  Name: [name] MRN: [id number] Date of Birth: 07/09/1928

## 2018-06-20 ENCOUNTER — Encounter (HOSPITAL_COMMUNITY): Payer: Self-pay | Admitting: Physical Therapy

## 2018-06-20 ENCOUNTER — Ambulatory Visit (HOSPITAL_COMMUNITY): Payer: Medicare Other | Admitting: Physical Therapy

## 2018-06-20 DIAGNOSIS — S91002S Unspecified open wound, left ankle, sequela: Secondary | ICD-10-CM | POA: Diagnosis not present

## 2018-06-20 DIAGNOSIS — R601 Generalized edema: Secondary | ICD-10-CM

## 2018-06-20 NOTE — Therapy (Signed)
[place], [place], [address] Phone: [phone]   Fax:  [phone]  Wound Care Therapy  Patient Details  Name: [name] MRN: [id number] Date of Birth: 11/02/1928 Referring Provider (PT): [place]   Encounter Date: 06/20/2018  PT End of Session - 06/20/18 1103    Visit Number  16    Number of Visits  20    Date for PT Re-Evaluation  07/06/18    Authorization Type  medicare    Authorization Time Period  2/4/ thru 3/4    Authorization - Visit Number  16    Authorization - Number of Visits  20    PT Start Time  1020    PT Stop Time  1050    PT Time Calculation (min)  30 min    Activity Tolerance  Patient tolerated treatment well    Behavior During Therapy  [facility] for tasks assessed/performed       Past Medical History:  Diagnosis Date  . Anemia    Hemoglobin of 11.6 in 01/2010; 12.1 in 08/2010  . Atrial fibrillation ([place])    chronic requiring anticoagulation  . Chronic anticoagulation   . Chronic kidney disease    Stage III; creatinine of 1.7 in 04/2007 and 1.5 2009; 1.7 in 04/2010  . Hyperlipidemia    Total cholesterol of 226, triglycerides of 90, HDL of 49 and LDL of 159 in 01/2010  . Hypertension     Past Surgical History:  Procedure Laterality Date  . APPENDECTOMY    . BUNIONECTOMY  1995  . PARTIAL HYSTERECTOMY      There were no vitals filed for this visit.       Wound Therapy - 06/20/18 1051    Subjective  Pt states that she is not having any pain.      Patient and Family Stated Goals  for wound to heal.     Date of Onset  04/22/18    Prior Treatments  self care, antibiotic at ER    Evaluation and Treatment Procedures Explained to Patient/Family  Yes    Evaluation and Treatment Procedures  agreed to    Wound Properties Date First Assessed: 04/28/18 Time First Assessed: 1440 Wound Type: Other (Comment) Location: Ankle Location Orientation: Left;Lateral Wound Description  (Comments): dark red not granulation, hypertrophic  Present on Admission: Yes   Dressing Type  Impregnated gauze (bismuth);Alginate;Compression wrap    Dressing Changed  Changed    Dressing Status  Old drainage    Dressing Change Frequency  PRN    Site / Wound Assessment  Granulation tissue    % Wound base Red or Granulating  100%   hypergranluation present   % Wound base Yellow/Fibrinous Exudate  0%    % Wound base Black/Eschar  0%    Peri-wound Assessment  Intact;Edema    Wound Length (cm)  1.2 cm   was 1, wound irregular, change most likely due to different  therapist measuring    Wound Width (cm)  1.5 cm   was 2    Wound Surface Area (cm^2)  1.8 cm^2    Margins  Attached edges (approximated)    Drainage Amount  Minimal    Drainage Description  Sanguineous    Treatment  Cleansed    Wound Properties Date First Assessed: 05/23/18 Time First Assessed: 1120 Wound Type: Other (Comment) Location: Ankle Location Orientation: Left;Anterior Wound Description (Comments): anterior aspect of LT ankle  Present on Admission: No  Dressing Type  Impregnated gauze (bismuth);Alginate;Compression wrap    Dressing Changed  Changed    Dressing Status  Clean;Dry;Intact    Dressing Change Frequency  PRN    Site / Wound Assessment  Clean;Dry    % Wound base Red or Granulating  100%    % Wound base Yellow/Fibrinous Exudate  10%    % Wound base Black/Eschar  0%    Wound Length (cm)  0.2 cm   was .5   Wound Width (cm)  0.3 cm   was .5    Wound Surface Area (cm^2)  0.06 cm^2    Drainage Amount  Scant    Treatment  Cleansed    Wound Properties Date First Assessed: 06/06/18 Time First Assessed: 7412 Wound Type: Other (Comment) Location: Ankle Location Orientation: Left;Medial;Posterior Final Assessment Date: 06/20/18 Final Assessment Time: 1056   Selective Debridement - Location  --    Selective Debridement - Tools Used  --    Selective Debridement - Tissue Removed  --    Wound Therapy - Clinical  Statement  Wound is clean needing no debridement today.  Wound size continues to approximate.  Pt toes and forefoot with increased edema therefore manual was performed to decrease the edema.  New foam cut for ontop of wounds as this appears to be stopping the hypergranulation well.  Wounds covered with xeroform to keep wounds moist followed by profore wrapping with extra cotton to create a conial shape.  wound care can be decreased to once a week at this time.     Wound Therapy - Functional Problem List  wound bleeds very easily so it is difficult to don socks.     Factors Delaying/Impairing Wound Healing  Altered sensation;Infection - systemic/local;Immobility;Multiple medical problems;Polypharmacy    Hydrotherapy Plan  Debridement;Dressing change;Patient/family education    Wound Therapy - Frequency  Other (comment)   decrease to once a week    Wound Therapy - Current Recommendations  PT    Wound Plan  Continue cleanse, debride,( if needed), and moisture LE with appropriate compression dressings.      Dressing   xeroform, profore lite                PT Short Term Goals - 06/20/18 1105      PT SHORT TERM GOAL #1   Title  PT daughter to be able to verbalize the sign and symptoms of cellulitis and the need to get medical attention immediatelyl.     Time  1    Period  Weeks    Status  Achieved      PT SHORT TERM GOAL #2   Title  Wound to decrease in size by 1.5 cm LEngth and width and no longer by hypergranulated     Time  3    Period  Weeks    Status  Achieved        PT Long Term Goals - 06/20/18 1105      PT LONG TERM GOAL #1   Title  PT wound to be 100% granulated with healthy red tissue     Time  6    Period  Weeks    Status  Achieved      PT LONG TERM GOAL #2   Title  PT wound size to be decreased to no greater than 2x2 cm to allow daughter to feel confident at self care of wound      Baseline  wound size is smaller than 2x2 but daughter  is fearful of self care due  to the length of time it has taken to get the wound healed.     Time  6    Period  Weeks    Status  Partially Met      PT LONG TERM GOAL #3   Title  PT to be able to don a sock without causing wound to bleed     Time  6    Period  Weeks    Status  On-going            Plan - 06/20/18 1105    Clinical Impression Statement  see above     Rehab Potential  Good    PT Frequency  1x / week    PT Duration  6 weeks    PT Treatment/Interventions  ADLs/Self Care Home Management;Manual techniques   debridement and compression dressing    PT Next Visit Plan  decrease to one time a week at this time.     Consulted and Agree with Plan of Care  Patient;Family member/caregiver       Patient will benefit from skilled therapeutic intervention in order to improve the following deficits and impairments:  Decreased skin integrity, Increased edema, Pain  Visit Diagnosis: Generalized edema  Ankle wound, left, sequela     Problem List Patient Active Problem List   Diagnosis Date Noted  . Impaired gait and mobility 05/24/2017  . Weakness 04/28/2016  . Loss of weight 10/22/2013  . Encounter for therapeutic drug monitoring 06/04/2013  . Dementia ([place]) 05/18/2013  . UTI (urinary tract infection) 10/05/2012  . Chronic insomnia 06/30/2012  . Headache(784.0) 03/03/2012  . Eosinophilia 11/27/2011  . Peripheral vascular disease ([place]) 09/29/2011  . Chronic kidney disease   . Tobacco abuse, in remission   . Chronic anticoagulation   . 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 Hyperlipidemia 01/01/2010  . Anemia 04/09/2009  . Hypertension 04/09/2009  . ATRIAL FIBRILLATION, CHRONIC 04/09/2009  Rayetta Humphrey, PT CLT [phone] 06/20/2018, 11:07 AM  [facility] [address] [place], [place], [address] Phone: [phone]   Fax:  [phone]  Name: [name] MRN: [id number] Date of Birth: [birth year]

## 2018-06-21 ENCOUNTER — Ambulatory Visit (INDEPENDENT_AMBULATORY_CARE_PROVIDER_SITE_OTHER): Payer: Medicare Other | Admitting: *Deleted

## 2018-06-21 DIAGNOSIS — I4891 Unspecified atrial fibrillation: Secondary | ICD-10-CM

## 2018-06-21 DIAGNOSIS — Z5181 Encounter for therapeutic drug level monitoring: Secondary | ICD-10-CM

## 2018-06-21 DIAGNOSIS — I749 Embolism and thrombosis of unspecified artery: Secondary | ICD-10-CM | POA: Diagnosis not present

## 2018-06-21 LAB — POCT INR: INR: 1.9 — AB (ref 2.0–3.0)

## 2018-06-21 NOTE — Patient Instructions (Signed)
Take 4mg  today then continue 4mg  daily except 2.5mg  on Wednesdays Recheck in 4 weeks

## 2018-06-22 ENCOUNTER — Ambulatory Visit (HOSPITAL_COMMUNITY): Payer: Medicare Other

## 2018-06-27 ENCOUNTER — Ambulatory Visit (HOSPITAL_COMMUNITY): Payer: Medicare Other | Admitting: Physical Therapy

## 2018-06-29 ENCOUNTER — Ambulatory Visit (HOSPITAL_COMMUNITY): Payer: Medicare Other | Admitting: Physical Therapy

## 2018-06-29 DIAGNOSIS — S91002S Unspecified open wound, left ankle, sequela: Secondary | ICD-10-CM

## 2018-06-29 DIAGNOSIS — R601 Generalized edema: Secondary | ICD-10-CM | POA: Diagnosis not present

## 2018-06-29 NOTE — Therapy (Signed)
[place], [place], [address] Phone: [phone]   Fax:  [phone]  Physical Therapy Treatment  Patient Details  Name: [name] MRN: [id number] Date of Birth: 01/30/1929 Referring Provider (PT): [place]   Encounter Date: 06/29/2018  PT End of Session - 06/29/18 1826    Visit Number  17    Number of Visits  20    Date for PT Re-Evaluation  07/06/18    Authorization Type  medicare    Authorization Time Period  2/4/ thru 3/4    Authorization - Visit Number  17    Authorization - Number of Visits  20    PT Start Time  1122    PT Stop Time  1200    PT Time Calculation (min)  38 min    Activity Tolerance  Patient tolerated treatment well    Behavior During Therapy  [facility] for tasks assessed/performed       Past Medical History:  Diagnosis Date  . Anemia    Hemoglobin of 11.6 in 01/2010; 12.1 in 08/2010  . Atrial fibrillation ([place])    chronic requiring anticoagulation  . Chronic anticoagulation   . Chronic kidney disease    Stage III; creatinine of 1.7 in 04/2007 and 1.5 2009; 1.7 in 04/2010  . Hyperlipidemia    Total cholesterol of 226, triglycerides of 90, HDL of 49 and LDL of 159 in 01/2010  . Hypertension     Past Surgical History:  Procedure Laterality Date  . APPENDECTOMY    . BUNIONECTOMY  1995  . PARTIAL HYSTERECTOMY      There were no vitals filed for this visit.                  Wound Therapy - 06/29/18 1821    Subjective  pt states she is doing well without issues.  Dressings remain intact.     Patient and Family Stated Goals  for wound to heal.     Date of Onset  04/22/18    Prior Treatments  self care, antibiotic at ER    Evaluation and Treatment Procedures Explained to Patient/Family  Yes    Evaluation and Treatment Procedures  agreed to    Wound Properties Date First Assessed: 05/23/18 Time First Assessed: 1120 Wound Type: Other (Comment) Location: Ankle  Location Orientation: Left;Anterior Wound Description (Comments): anterior aspect of LT ankle  Present on Admission: No Final Assessment Date: 06/29/18   Wound Properties Date First Assessed: 04/28/18 Time First Assessed: 1440 Wound Type: Other (Comment) Location: Ankle Location Orientation: Left;Lateral Wound Description (Comments): dark red not granulation, hypertrophic  Present on Admission: Yes   Dressing Type  Impregnated gauze (bismuth);Alginate;Compression wrap    Dressing Changed  Changed    Dressing Status  Old drainage    Dressing Change Frequency  PRN    Site / Wound Assessment  Granulation tissue    % Wound base Red or Granulating  100%   hypergranluation present   % Wound base Yellow/Fibrinous Exudate  0%    % Wound base Black/Eschar  0%    Peri-wound Assessment  Intact;Edema    Wound Length (cm)  2.1 cm    Wound Width (cm)  1 cm    Wound Depth (cm)  0 cm    Wound Volume (cm^3)  0 cm^3    Wound Surface Area (cm^2)  2.1 cm^2    Margins  Attached edges (approximated)    Drainage Amount  Minimal    Drainage Description  Sanguineous    Treatment  Cleansed    Wound Therapy - Clinical Statement  Small anterior wound is now healed.  Larger wound at medial ankle measured with increased size and also with increased drainage this session . REmoved dry skin from periemeter with some hypergranulation present along inferior border of wound.  Changed dressing to silver hydrofiber and ommited the sponge this session to see if wound improves.  Contineud with profore lite for compression     Wound Therapy - Functional Problem List  wound bleeds very easily so it is difficult to don socks.     Factors Delaying/Impairing Wound Healing  Altered sensation;Infection - systemic/local;Immobility;Multiple medical problems;Polypharmacy    Hydrotherapy Plan  Debridement;Dressing change;Patient/family education    Wound Therapy - Frequency  Other (comment)   decrease to once a week    Wound Therapy -  Current Recommendations  PT    Wound Plan  Continue cleanse, debride,( if needed), and moisture LE with appropriate compression dressings.      Dressing   silver hydro, profore lite                 PT Short Term Goals - 06/20/18 1105      PT SHORT TERM GOAL #1   Title  PT daughter to be able to verbalize the sign and symptoms of cellulitis and the need to get medical attention immediatelyl.     Time  1    Period  Weeks    Status  Achieved      PT SHORT TERM GOAL #2   Title  Wound to decrease in size by 1.5 cm LEngth and width and no longer by hypergranulated     Time  3    Period  Weeks    Status  Achieved        PT Long Term Goals - 06/20/18 1105      PT LONG TERM GOAL #1   Title  PT wound to be 100% granulated with healthy red tissue     Time  6    Period  Weeks    Status  Achieved      PT LONG TERM GOAL #2   Title  PT wound size to be decreased to no greater than 2x2 cm to allow daughter to feel confident at self care of wound      Baseline  wound size is smaller than 2x2 but daughter is fearful of self care due to the length of time it has taken to get the wound healed.     Time  6    Period  Weeks    Status  Partially Met      PT LONG TERM GOAL #3   Title  PT to be able to don a sock without causing wound to bleed     Time  6    Period  Weeks    Status  On-going              Patient will benefit from skilled therapeutic intervention in order to improve the following deficits and impairments:     Visit Diagnosis: Generalized edema  Ankle wound, left, sequela     Problem List Patient Active Problem List   Diagnosis Date Noted  . Impaired gait and mobility 05/24/2017  . Weakness 04/28/2016  . Loss of weight 10/22/2013  . Encounter for therapeutic drug monitoring 06/04/2013  . Dementia ([place]) 05/18/2013  . UTI (urinary  tract infection) 10/05/2012  . Chronic insomnia 06/30/2012  . Headache(784.0) 03/03/2012  . Eosinophilia 11/27/2011   . Peripheral vascular disease ([place]) 09/29/2011  . Chronic kidney disease   . Tobacco abuse, in remission   . Chronic anticoagulation   . 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 Hyperlipidemia 01/01/2010  . Anemia 04/09/2009  . Hypertension 04/09/2009  . ATRIAL FIBRILLATION, CHRONIC 04/09/2009   Teena Irani, PTA/CLT [phone]  Teena Irani 06/29/2018, 6:28 PM  [place], [place], [address] Phone: [phone]   Fax:  [phone]  Name: [name] MRN: [id number] Date of Birth: 01/29/1929

## 2018-07-04 ENCOUNTER — Ambulatory Visit (HOSPITAL_COMMUNITY): Payer: Medicare Other | Admitting: Physical Therapy

## 2018-07-06 ENCOUNTER — Ambulatory Visit (HOSPITAL_COMMUNITY): Payer: Medicare Other | Attending: Family Medicine | Admitting: Physical Therapy

## 2018-07-06 DIAGNOSIS — S91002S Unspecified open wound, left ankle, sequela: Secondary | ICD-10-CM | POA: Diagnosis not present

## 2018-07-06 DIAGNOSIS — R601 Generalized edema: Secondary | ICD-10-CM | POA: Insufficient documentation

## 2018-07-06 NOTE — Therapy (Signed)
[place], [place], [address] Phone: [phone]   Fax:  [phone]  Wound Care Therapy  Patient Details  Name: [name] MRN: [id number] Date of Birth: [birth year] Referring Provider (PT): [place]   Encounter Date: 07/06/2018  PT End of Session - 07/06/18 1546    Visit Number  18   progress note completed visit #10   Number of Visits  26   Date for PT Re-Evaluation  07/06/18    Authorization Type  medicare    Authorization Time Period  2/4/ thru 3/4    Authorization - Visit Number  18    Authorization - Number of Visits  26    PT Start Time  1132    PT Stop Time  1154    PT Time Calculation (min)  22 min    Activity Tolerance  Patient tolerated treatment well    Behavior During Therapy  [facility] for tasks assessed/performed       Past Medical History:  Diagnosis Date  . Anemia    Hemoglobin of 11.6 in 01/2010; 12.1 in 08/2010  . Atrial fibrillation ([place])    chronic requiring anticoagulation  . Chronic anticoagulation   . Chronic kidney disease    Stage III; creatinine of 1.7 in 04/2007 and 1.5 2009; 1.7 in 04/2010  . Hyperlipidemia    Total cholesterol of 226, triglycerides of 90, HDL of 49 and LDL of 159 in 01/2010  . Hypertension     Past Surgical History:  Procedure Laterality Date  . APPENDECTOMY    . BUNIONECTOMY  1995  . PARTIAL HYSTERECTOMY      There were no vitals filed for this visit.              Wound Therapy - 07/06/18 1550    Subjective  Pt without any issues.  Dressing intact but slid down some.    Patient and Family Stated Goals  for wound to heal.     Date of Onset  04/22/18    Prior Treatments  self care, antibiotic at ER    Evaluation and Treatment Procedures Explained to Patient/Family  Yes    Evaluation and Treatment Procedures  agreed to    Wound Properties Date First Assessed: 04/28/18 Time First Assessed: 1440 Wound Type: Other (Comment) Location: Ankle  Location Orientation: Left;Lateral Wound Description (Comments): dark red not granulation, hypertrophic  Present on Admission: Yes   Dressing Type  Impregnated gauze (bismuth);Alginate;Compression wrap    Dressing Changed  Changed    Dressing Status  Old drainage    Dressing Change Frequency  PRN    Site / Wound Assessment  Granulation tissue    % Wound base Red or Granulating  100%   hypergranluation present   % Wound base Yellow/Fibrinous Exudate  0%    % Wound base Black/Eschar  0%    Peri-wound Assessment  Intact;Edema    Wound Length (cm)  1.5 cm was 2.1 on 2/27   Wound Width (cm)  1 cm was 1cm on 2/27   Wound Depth (cm)  0 cm    Wound Volume (cm^3)  0 cm^3    Wound Surface Area (cm^2)  1.5 cm^2    Margins  Attached edges (approximated)    Drainage Amount  Minimal    Drainage Description  Sanguineous    Treatment  Cleansed;Debridement (Selective)    Wound Therapy - Clinical Statement  Much improvement noted today in wound with overall  reduction in size and approximating well to borders.  REmoved some dry skin from periemeter, cleansed and moisturized well.  Pt will need continued woundcare until wound is healed.      Wound Therapy - Functional Problem List  wound bleeds very easily so it is difficult to don socks.     Factors Delaying/Impairing Wound Healing  Altered sensation;Infection - systemic/local;Immobility;Multiple medical problems;Polypharmacy    Hydrotherapy Plan  Debridement;Dressing change;Patient/family education    Wound Therapy - Frequency  Other (comment)   decrease to once a week    Wound Therapy - Current Recommendations  PT    Wound Plan  Continue cleanse, debride,( if needed), and moisture LE with appropriate compression dressings.      Dressing   silver hydro, profore lite                PT Short Term Goals - 06/20/18 1105      PT SHORT TERM GOAL #1   Title  PT daughter to be able to verbalize the sign and symptoms of cellulitis and the need to  get medical attention immediatelyl.     Time  1    Period  Weeks    Status  Achieved      PT SHORT TERM GOAL #2   Title  Wound to decrease in size by 1.5 cm LEngth and width and no longer by hypergranulated     Time  3    Period  Weeks    Status  Achieved        PT Long Term Goals - 06/20/18 1105      PT LONG TERM GOAL #1   Title  PT wound to be 100% granulated with healthy red tissue     Time  6    Period  Weeks    Status  Achieved      PT LONG TERM GOAL #2   Title  PT wound size to be decreased to no greater than 2x2 cm to allow daughter to feel confident at self care of wound      Baseline  wound size is smaller than 2x2 but daughter is fearful of self care due to the length of time it has taken to get the wound healed.     Time  6    Period  Weeks    Status  Partially Met      PT LONG TERM GOAL #3   Title  PT to be able to don a sock without causing wound to bleed     Time  6    Period  Weeks    Status  On-going              Patient will benefit from skilled therapeutic intervention in order to improve the following deficits and impairments:     Visit Diagnosis: Generalized edema  Ankle wound, left, sequela     Problem List Patient Active Problem List   Diagnosis Date Noted  . Impaired gait and mobility 05/24/2017  . Weakness 04/28/2016  . Loss of weight 10/22/2013  . Encounter for therapeutic drug monitoring 06/04/2013  . Dementia ([place]) 05/18/2013  . UTI (urinary tract infection) 10/05/2012  . Chronic insomnia 06/30/2012  . Headache(784.0) 03/03/2012  . Eosinophilia 11/27/2011  . Peripheral vascular disease ([place]) 09/29/2011  . Chronic kidney disease   . Tobacco abuse, in remission   . Chronic anticoagulation   . 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 Hyperlipidemia 01/01/2010  .  Anemia 04/09/2009  . Hypertension 04/09/2009  . ATRIAL FIBRILLATION, CHRONIC 04/09/2009   Teena Irani, PTA/CLT [place], PT CLT [phone] 07/06/2018, 3:57 PM  [place], [place], [address] Phone: [phone]   Fax:  [phone]  Name: [name] MRN: [id number] Date of Birth: [birth year]

## 2018-07-11 ENCOUNTER — Ambulatory Visit (HOSPITAL_COMMUNITY): Payer: Medicare Other | Admitting: Physical Therapy

## 2018-07-11 ENCOUNTER — Other Ambulatory Visit: Payer: Self-pay

## 2018-07-12 ENCOUNTER — Encounter: Payer: Self-pay | Admitting: Family Medicine

## 2018-07-12 ENCOUNTER — Ambulatory Visit (INDEPENDENT_AMBULATORY_CARE_PROVIDER_SITE_OTHER): Payer: Medicare Other | Admitting: Family Medicine

## 2018-07-12 VITALS — BP 128/62 | HR 82 | Temp 98.1°F | Resp 14

## 2018-07-12 DIAGNOSIS — R21 Rash and other nonspecific skin eruption: Secondary | ICD-10-CM

## 2018-07-12 DIAGNOSIS — I749 Embolism and thrombosis of unspecified artery: Secondary | ICD-10-CM | POA: Diagnosis not present

## 2018-07-12 DIAGNOSIS — L299 Pruritus, unspecified: Secondary | ICD-10-CM | POA: Diagnosis not present

## 2018-07-12 MED ORDER — FAMOTIDINE 20 MG PO TABS: 20.0000 mg | ORAL_TABLET | Freq: Two times a day (BID) | ORAL | 6 refills | Status: DC

## 2018-07-12 MED ORDER — PREDNISONE 10 MG PO TABS: ORAL_TABLET | ORAL | 0 refills | Status: DC

## 2018-07-12 MED ORDER — DIPHENHYDRAMINE HCL 50 MG/ML IJ SOLN
12.5000 mg | Freq: Once | INTRAMUSCULAR | Status: AC
  Administered 2018-07-12: 12.5 mg via INTRAMUSCULAR

## 2018-07-12 MED ORDER — METHYLPREDNISOLONE ACETATE 40 MG/ML IJ SUSP
40.0000 mg | Freq: Once | INTRAMUSCULAR | Status: AC
  Administered 2018-07-12: 40 mg via INTRAMUSCULAR

## 2018-07-12 NOTE — Patient Instructions (Addendum)
Take pepcid twice a day  Take the steroids starting tomorrow  IF she gets difficulty breathing, swelling of tongue, eyes swell shut take her to the emergency room F/U  Dr. Tanya Nones on Friday

## 2018-07-12 NOTE — Progress Notes (Signed)
Subjective:    Patient ID: [name], female    DOB: 1929-03-02, 85 y.o.   MRN: [id number]  Patient presents for Rash (x1 day- facial irritation and swelling- forehead, L cheek, L ear, L arm)  Patient here with her daughter as she has dementia unable to give any accurate information.  Daughter states that she woke up yesterday with erythema of her face and her arms she has been itching.  This is different from her previous pruritus.  She does not recall giving her any new foods medications she did change detergent but that was a few weeks ago.  She has swelling which looks like a water bag that is developmentally both eyes.  She has not had any difficulty breathing or coughing.  This morning the rash looked worse so she came in for a visit.  She is still eating at her baseline.  I reviewed her medications in detail she was on Pepcid in the past along with other antihistamines she has been given her Pepcid only as needed for itching which she has had a couple times over the past week.  Not had any contact with any other rash or illness.  She is still being followed at the wound center for her leg which is currently wrapped.  Review Of Systems: UNABLE TO OBTAIN  GEN- denies fatigue, fever, weight loss,weakness, recent illness HEENT- denies eye drainage, change in vision, nasal discharge, CVS- denies chest pain, palpitations RESP- denies SOB, cough, wheeze ABD- denies N/V, change in stools, abd pain GU- denies dysuria, hematuria, dribbling, incontinence MSK- denies joint pain, muscle aches, injury Neuro- denies headache, dizziness, syncope, seizure activity       Objective:    BP 128/62   Pulse 82   Temp 98.1 F (36.7 C) (Oral)   Resp 14   SpO2 100%  GEN- NAD, alert and oriented x person HEENT- PERRL, EOMI, non injected sclera, pink conjunctiva, MMM, oropharynx clear Neck- Supple, no LAD CVS- RRR, no murmur RESP-CTAB ABD-NABS,soft, NT,ND Skin - erythema with swelling beneath  eyes (bags L >R), erythema over bilat forearms, no blistering lesions, no lesions on palms or legs, mild excoriations on arms/ erythema left ear  Ext- Left left wrapped        Assessment & Plan:      Problem List Items Addressed This Visit    None    Visit Diagnoses    Rash and nonspecific skin eruption    -  Primary   Appears to have allergic reaction, but no new contacts, besides detergent but has used for wekee, no new meds or recent illness. unclear  cause. discussed red flags.  If he gets swelling of her tongue if her eyes swell shut if she has difficulty breathing coughing wheezing take her urgently to the emergency room.  I have given her Depo-Medrol injection as well as Benadryl here in the office.  She will continue Pepcid twice a day along with a steroid taper.  She is going follow-up for recheck in 48 hours.  They are not to introduce anything else new food anything onto the skin.  She does not appear to be in any pain she does not have any fever.  This does not look like a typical cellulitis.   Relevant Medications   methylPREDNISolone acetate (DEPO-MEDROL) injection 40 mg (Completed)   diphenhydrAMINE (BENADRYL) injection 12.5 mg (Completed)   Pruritic dermatitis       Relevant Medications   methylPREDNISolone acetate (DEPO-MEDROL) injection 40  mg (Completed)   diphenhydrAMINE (BENADRYL) injection 12.5 mg (Completed)      Note: This dictation was prepared with Dragon dictation along with smaller phrase technology. Any transcriptional errors that result from this process are unintentional.

## 2018-07-13 ENCOUNTER — Other Ambulatory Visit: Payer: Self-pay

## 2018-07-13 ENCOUNTER — Ambulatory Visit: Payer: Medicare Other | Admitting: Family Medicine

## 2018-07-13 ENCOUNTER — Ambulatory Visit (HOSPITAL_COMMUNITY): Payer: Medicare Other | Admitting: Physical Therapy

## 2018-07-13 DIAGNOSIS — I130 Hypertensive heart and chronic kidney disease with heart failure and stage 1 through stage 4 chronic kidney disease, or unspecified chronic kidney disease: Secondary | ICD-10-CM | POA: Diagnosis present

## 2018-07-13 DIAGNOSIS — Z90711 Acquired absence of uterus with remaining cervical stump: Secondary | ICD-10-CM

## 2018-07-13 DIAGNOSIS — Z8249 Family history of ischemic heart disease and other diseases of the circulatory system: Secondary | ICD-10-CM

## 2018-07-13 DIAGNOSIS — H05011 Cellulitis of right orbit: Secondary | ICD-10-CM | POA: Diagnosis not present

## 2018-07-13 DIAGNOSIS — Z833 Family history of diabetes mellitus: Secondary | ICD-10-CM

## 2018-07-13 DIAGNOSIS — J189 Pneumonia, unspecified organism: Secondary | ICD-10-CM | POA: Diagnosis not present

## 2018-07-13 DIAGNOSIS — I5032 Chronic diastolic (congestive) heart failure: Secondary | ICD-10-CM | POA: Diagnosis not present

## 2018-07-13 DIAGNOSIS — J9601 Acute respiratory failure with hypoxia: Secondary | ICD-10-CM | POA: Diagnosis not present

## 2018-07-13 DIAGNOSIS — H5789 Other specified disorders of eye and adnexa: Secondary | ICD-10-CM | POA: Diagnosis not present

## 2018-07-13 DIAGNOSIS — I517 Cardiomegaly: Secondary | ICD-10-CM | POA: Diagnosis not present

## 2018-07-13 DIAGNOSIS — J181 Lobar pneumonia, unspecified organism: Secondary | ICD-10-CM | POA: Diagnosis not present

## 2018-07-13 DIAGNOSIS — L03213 Periorbital cellulitis: Principal | ICD-10-CM | POA: Diagnosis present

## 2018-07-13 DIAGNOSIS — F0390 Unspecified dementia without behavioral disturbance: Secondary | ICD-10-CM | POA: Diagnosis present

## 2018-07-13 DIAGNOSIS — R1110 Vomiting, unspecified: Secondary | ICD-10-CM | POA: Diagnosis present

## 2018-07-13 DIAGNOSIS — S91002S Unspecified open wound, left ankle, sequela: Secondary | ICD-10-CM

## 2018-07-13 DIAGNOSIS — I4891 Unspecified atrial fibrillation: Secondary | ICD-10-CM | POA: Diagnosis not present

## 2018-07-13 DIAGNOSIS — I482 Chronic atrial fibrillation, unspecified: Secondary | ICD-10-CM | POA: Diagnosis not present

## 2018-07-13 DIAGNOSIS — N183 Chronic kidney disease, stage 3 (moderate): Secondary | ICD-10-CM | POA: Diagnosis present

## 2018-07-13 DIAGNOSIS — R601 Generalized edema: Secondary | ICD-10-CM

## 2018-07-13 DIAGNOSIS — E785 Hyperlipidemia, unspecified: Secondary | ICD-10-CM | POA: Diagnosis present

## 2018-07-13 DIAGNOSIS — Z7901 Long term (current) use of anticoagulants: Secondary | ICD-10-CM

## 2018-07-13 NOTE — Therapy (Addendum)
[place], [place], [address] Phone: [phone]   Fax:  [phone]  Wound Care Therapy  Patient Details  Name: [name] MRN: [id number] Date of Birth: 1928-09-12 Referring Provider (PT): [place]   Encounter Date: 07/13/2018   PT End of Session - 07/13/18 1546    Visit Number  19  progress note completed visit #10   Number of Visits  26   Date for PT Re-Evaluation  07/06/18    Authorization Type  medicare    Authorization Time Period  2/4/ thru 3/4    Authorization - Visit Number  18    Authorization - Number of Visits  26    PT Start Time  1132    PT Stop Time  1154    PT Time Calculation (min)  22 min    Activity Tolerance  Patient tolerated treatment well    Behavior During Therapy  [facility] for tasks assessed/performed         Past Medical History:  Diagnosis Date  . Anemia    Hemoglobin of 11.6 in 01/2010; 12.1 in 08/2010  . Atrial fibrillation ([place])    chronic requiring anticoagulation  . Chronic anticoagulation   . Chronic kidney disease    Stage III; creatinine of 1.7 in 04/2007 and 1.5 2009; 1.7 in 04/2010  . Hyperlipidemia    Total cholesterol of 226, triglycerides of 90, HDL of 49 and LDL of 159 in 01/2010  . Hypertension     Past Surgical History:  Procedure Laterality Date  . APPENDECTOMY    . BUNIONECTOMY  1995  . PARTIAL HYSTERECTOMY      There were no vitals filed for this visit.              Wound Therapy - 07/13/18 1354    Subjective  Pt without any issues.  Dressing intact but slid down some.    Patient and Family Stated Goals  for wound to heal.     Date of Onset  04/22/18    Prior Treatments  self care, antibiotic at ER    Evaluation and Treatment Procedures Explained to Patient/Family  Yes    Evaluation and Treatment Procedures  agreed to    Wound Properties Date First Assessed: 04/28/18 Time First Assessed: 1440 Wound Type: Other  (Comment) Location: Ankle Location Orientation: Left;Lateral Wound Description (Comments): dark red not granulation, hypertrophic  Present on Admission: Yes   Dressing Type  Impregnated gauze (bismuth);Alginate;Compression wrap    Dressing Changed  Changed    Dressing Status  Old drainage    Dressing Change Frequency  PRN    Site / Wound Assessment  Granulation tissue    % Wound base Red or Granulating  100%   hypergranluation present   % Wound base Yellow/Fibrinous Exudate  0%    % Wound base Black/Eschar  0%    Peri-wound Assessment  Intact;Edema    Wound Length (cm)  1.2 cm    Wound Width (cm)  0.8 cm    Wound Depth (cm)  0 cm    Wound Volume (cm^3)  0 cm^3    Wound Surface Area (cm^2)  0.96 cm^2    Margins  Attached edges (approximated)    Drainage Amount  Minimal    Drainage Description  Sanguineous    Treatment  Cleansed;Debridement (Selective)    Wound Therapy - Clinical Statement  remeasured this session with conitnued reduction noted.  Profore lite continues to  work well with keep ing fluid down.  Cleansed and moisturized LE well prior to redressing.  Only debridement need this session was to perimeter of wound to increase approximation.     Wound Therapy - Functional Problem List  wound bleeds very easily so it is difficult to don socks.     Factors Delaying/Impairing Wound Healing  Altered sensation;Infection - systemic/local;Immobility;Multiple medical problems;Polypharmacy    Hydrotherapy Plan  Debridement;Dressing change;Patient/family education    Wound Therapy - Frequency  Other (comment)   decrease to once a week    Wound Therapy - Current Recommendations  PT    Wound Plan  Continue cleanse, debride,( if needed), and moisture LE with appropriate compression dressings.      Dressing   xero, profore lite                PT Short Term Goals - 06/20/18 1105      PT SHORT TERM GOAL #1   Title  PT daughter to be able to verbalize the sign and symptoms of  cellulitis and the need to get medical attention immediatelyl.     Time  1    Period  Weeks    Status  Achieved      PT SHORT TERM GOAL #2   Title  Wound to decrease in size by 1.5 cm LEngth and width and no longer by hypergranulated     Time  3    Period  Weeks    Status  Achieved        PT Long Term Goals - 06/20/18 1105      PT LONG TERM GOAL #1   Title  PT wound to be 100% granulated with healthy red tissue     Time  6    Period  Weeks    Status  Achieved      PT LONG TERM GOAL #2   Title  PT wound size to be decreased to no greater than 2x2 cm to allow daughter to feel confident at self care of wound      Baseline  wound size is smaller than 2x2 but daughter is fearful of self care due to the length of time it has taken to get the wound healed.     Time  6    Period  Weeks    Status  Partially Met      PT LONG TERM GOAL #3   Title  PT to be able to don a sock without causing wound to bleed     Time  6    Period  Weeks    Status  On-going              Patient will benefit from skilled therapeutic intervention in order to improve the following deficits and impairments:     Visit Diagnosis: Generalized edema  Ankle wound, left, sequela     Problem List Patient Active Problem List   Diagnosis Date Noted  . Impaired gait and mobility 05/24/2017  . Weakness 04/28/2016  . Loss of weight 10/22/2013  . Encounter for therapeutic drug monitoring 06/04/2013  . Dementia ([place]) 05/18/2013  . UTI (urinary tract infection) 10/05/2012  . Chronic insomnia 06/30/2012  . Headache(784.0) 03/03/2012  . Eosinophilia 11/27/2011  . Peripheral vascular disease ([place]) 09/29/2011  . Chronic kidney disease   . Tobacco abuse, in remission   . Chronic anticoagulation   . 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 Hyperlipidemia 01/01/2010  . Anemia  04/09/2009  . Hypertension 04/09/2009  . ATRIAL FIBRILLATION, CHRONIC 04/09/2009   Teena Irani,  PTA/CLT [phone]  Teena Irani 07/13/2018, 1:57 PM  [place] [address] [place], [place], [address] Phone: [phone]   Fax:  [phone]  Name: [name] MRN: [id number] Date of Birth: [birth year]

## 2018-07-14 ENCOUNTER — Encounter: Payer: Self-pay | Admitting: Family Medicine

## 2018-07-14 ENCOUNTER — Ambulatory Visit (INDEPENDENT_AMBULATORY_CARE_PROVIDER_SITE_OTHER): Payer: Medicare Other | Admitting: Family Medicine

## 2018-07-14 VITALS — BP 130/68 | HR 82 | Temp 98.0°F | Resp 12

## 2018-07-14 DIAGNOSIS — I749 Embolism and thrombosis of unspecified artery: Secondary | ICD-10-CM | POA: Diagnosis not present

## 2018-07-14 DIAGNOSIS — L299 Pruritus, unspecified: Secondary | ICD-10-CM | POA: Diagnosis not present

## 2018-07-14 MED ORDER — DESONIDE 0.05 % EX CREA: TOPICAL_CREAM | Freq: Two times a day (BID) | CUTANEOUS | 0 refills | Status: DC

## 2018-07-14 NOTE — Progress Notes (Signed)
Subjective:    Patient ID: [name], female    DOB: May 18, 1928, 85 y.o.   MRN: [id number]  HPI Patient presents today for follow-up.  She recently saw my partner due to swelling and a contact dermatitis around both eyes.  There was erythema on her forehead around her left eye onto her left cheek and onto her chin.  There was an itchy examined form rash onto the left side of her neck.  The swelling under her left eye has completely subsided.  There is still some residual swelling under her right eye.  The erythema on her forehead and on her cheek and on her neck has faded.  She continues to itch and scratch her forehead and her neck.  Patient's daughter states that she itches all over her body.  She has been scratching constantly everywhere for months.  She has been given hydroxyzine for this in the past 10 mg 3 times a day but apparently that does not help.  She is on famotidine.  She is on prednisone now. Past Medical History:  Diagnosis Date  . Anemia    Hemoglobin of 11.6 in 01/2010; 12.1 in 08/2010  . Atrial fibrillation (HCC)    chronic requiring anticoagulation  . Chronic anticoagulation   . Chronic kidney disease    Stage III; creatinine of 1.7 in 04/2007 and 1.5 2009; 1.7 in 04/2010  . Hyperlipidemia    Total cholesterol of 226, triglycerides of 90, HDL of 49 and LDL of 159 in 01/2010  . Hypertension    Past Surgical History:  Procedure Laterality Date  . APPENDECTOMY    . BUNIONECTOMY  1995  . PARTIAL HYSTERECTOMY     Current Outpatient Medications on File Prior to Visit  Medication Sig Dispense Refill  . amLODipine (NORVASC) 10 MG tablet TAKE ONE TABLET BY MOUTH ONCE DAILY 90 tablet 3  . aspirin 81 MG tablet Take 81 mg by mouth daily.    . clotrimazole-betamethasone (LOTRISONE) cream Apply 1 application topically 2 (two) times daily. 45 g 0  . donepezil (ARICEPT) 10 MG tablet Take 1 tablet (10 mg total) by mouth daily with breakfast. 90 tablet 0  . famotidine (PEPCID) 20  MG tablet Take 1 tablet (20 mg total) by mouth 2 (two) times daily. For itching and reflux 60 tablet 6  . hydrOXYzine (ATARAX/VISTARIL) 10 MG tablet Take 10 mg by mouth 3 (three) times daily as needed for itching.    Marland Kitchen LORazepam (ATIVAN) 1 MG tablet TAKE 1 TABLET BY MOUTH AT BEDTIME AS NEEDED FOR ANXIETY AND AGITATION (Patient taking differently: Take 1 mg by mouth at bedtime as needed for anxiety (agitation). ) 30 tablet 5  . metoprolol tartrate (LOPRESSOR) 25 MG tablet Take 1 tablet (25 mg total) by mouth 2 (two) times daily. 180 tablet 0  . omeprazole (PRILOSEC) 40 MG capsule Take 40 mg by mouth daily.    . pravastatin (PRAVACHOL) 10 MG tablet TAKE 1 TABLET BY MOUTH ONCE DAILY (Patient taking differently: Take 10 mg by mouth daily. ) 90 tablet 3  . predniSONE (DELTASONE) 10 MG tablet Take 40mg  x 3 days,20mg  x 3 days, 10mg  x 3 days 21 tablet 0  . QUEtiapine (SEROQUEL) 50 MG tablet Take 1 tablet (50 mg total) by mouth daily with breakfast. 90 tablet 0  . warfarin (COUMADIN) 2.5 MG tablet Take 1 tablet (2.5mg ) on Wednesdays only.  Takes 4mg  all other days 30 tablet 1  . warfarin (COUMADIN) 4 MG tablet Take  4mg  daily except 2.5mg  on Wednesdays and Sundays (Patient taking differently: Take 4 mg by mouth See admin instructions. Take 4mg  daily except 2.5mg  on Wednesday) 60 tablet 3   No current facility-administered medications on file prior to visit.    No Known Allergies Social History   Socioeconomic History  . Marital status: Single    Spouse name: Not on file  . Number of children: Not on file  . Years of education: Not on file  . Highest education level: Not on file  Occupational History  . Occupation: Retired    Comment: CNA at a local SNF  Social Needs  . Financial resource strain: Not on file  . Food insecurity:    Worry: Not on file    Inability: Not on file  . Transportation needs:    Medical: Not on file    Non-medical: Not on file  Tobacco Use  . Smoking status: Never  Smoker  . Smokeless tobacco: Never Used  Substance and Sexual Activity  . Alcohol use: No    Alcohol/week: 0.0 standard drinks  . Drug use: No  . Sexual activity: Never  Lifestyle  . Physical activity:    Days per week: Not on file    Minutes per session: Not on file  . Stress: Not on file  Relationships  . Social connections:    Talks on phone: Not on file    Gets together: Not on file    Attends religious service: Not on file    Active member of club or organization: Not on file    Attends meetings of clubs or organizations: Not on file    Relationship status: Not on file  . Intimate partner violence:    Fear of current or ex partner: Not on file    Emotionally abused: Not on file    Physically abused: Not on file    Forced sexual activity: Not on file  Other Topics Concern  . Not on file  Social History Narrative  . Not on file      Review of Systems  All other systems reviewed and are negative.      Objective:   Physical Exam Vitals signs reviewed.  Constitutional:      Appearance: Normal appearance. She is normal weight.  HENT:     Head:   Cardiovascular:     Rate and Rhythm: Normal rate.     Heart sounds: Normal heart sounds.  Pulmonary:     Effort: Pulmonary effort is normal.     Breath sounds: Normal breath sounds.  Skin:    Findings: Rash present.  Neurological:     Mental Status: She is alert.           Assessment & Plan:  Pruritus - Plan: CBC with Differential/Platelet, COMPLETE METABOLIC PANEL WITH GFR  Patient is allergic reaction has improved dramatically on prednisone and Pepcid.  Therefore I recommended that they finish the prednisone as prescribed by the PCP.  However she continues to itch and scratch all over her body and therefore I will check a CBC to evaluate for any evidence of polycythemia.  I will check a CMP to evaluate for any kidney failure or evidence of liver dysfunction that can cause generalized pruritus.  Perhaps this  could also be related to neuropathic itching secondary to her underlying dementia.  If hydroxyzine is not working, perhaps the patient would benefit from gabapentin for diffuse itching.  She can also use DesOwen cream applied sparingly twice  daily on her forehead, cheek and neck to help with itching as the rash subsides.

## 2018-07-15 ENCOUNTER — Other Ambulatory Visit: Payer: Self-pay

## 2018-07-15 ENCOUNTER — Emergency Department (HOSPITAL_COMMUNITY): Payer: Medicare Other

## 2018-07-15 ENCOUNTER — Inpatient Hospital Stay (HOSPITAL_COMMUNITY)
Admission: EM | Admit: 2018-07-15 | Discharge: 2018-07-17 | DRG: 602 | Disposition: A | Discharge status: Home / Self Care | Payer: Medicare Other | Attending: Internal Medicine | Admitting: Internal Medicine

## 2018-07-15 DIAGNOSIS — I130 Hypertensive heart and chronic kidney disease with heart failure and stage 1 through stage 4 chronic kidney disease, or unspecified chronic kidney disease: Secondary | ICD-10-CM | POA: Diagnosis present

## 2018-07-15 DIAGNOSIS — H05011 Cellulitis of right orbit: Secondary | ICD-10-CM | POA: Diagnosis not present

## 2018-07-15 DIAGNOSIS — Z7901 Long term (current) use of anticoagulants: Secondary | ICD-10-CM | POA: Diagnosis not present

## 2018-07-15 DIAGNOSIS — H5789 Other specified disorders of eye and adnexa: Secondary | ICD-10-CM | POA: Diagnosis not present

## 2018-07-15 DIAGNOSIS — Z90711 Acquired absence of uterus with remaining cervical stump: Secondary | ICD-10-CM | POA: Diagnosis not present

## 2018-07-15 DIAGNOSIS — G309 Alzheimer's disease, unspecified: Secondary | ICD-10-CM | POA: Diagnosis not present

## 2018-07-15 DIAGNOSIS — R1110 Vomiting, unspecified: Secondary | ICD-10-CM | POA: Diagnosis present

## 2018-07-15 DIAGNOSIS — I4891 Unspecified atrial fibrillation: Secondary | ICD-10-CM | POA: Diagnosis not present

## 2018-07-15 DIAGNOSIS — R000 Tachycardia, unspecified: Secondary | ICD-10-CM | POA: Diagnosis not present

## 2018-07-15 DIAGNOSIS — F0390 Unspecified dementia without behavioral disturbance: Secondary | ICD-10-CM | POA: Diagnosis present

## 2018-07-15 DIAGNOSIS — I5032 Chronic diastolic (congestive) heart failure: Secondary | ICD-10-CM | POA: Diagnosis present

## 2018-07-15 DIAGNOSIS — J181 Lobar pneumonia, unspecified organism: Secondary | ICD-10-CM | POA: Diagnosis not present

## 2018-07-15 DIAGNOSIS — J9601 Acute respiratory failure with hypoxia: Secondary | ICD-10-CM | POA: Diagnosis present

## 2018-07-15 DIAGNOSIS — Z833 Family history of diabetes mellitus: Secondary | ICD-10-CM | POA: Diagnosis not present

## 2018-07-15 DIAGNOSIS — Z8249 Family history of ischemic heart disease and other diseases of the circulatory system: Secondary | ICD-10-CM | POA: Diagnosis not present

## 2018-07-15 DIAGNOSIS — R0689 Other abnormalities of breathing: Secondary | ICD-10-CM | POA: Diagnosis not present

## 2018-07-15 DIAGNOSIS — I517 Cardiomegaly: Secondary | ICD-10-CM | POA: Diagnosis not present

## 2018-07-15 DIAGNOSIS — I10 Essential (primary) hypertension: Secondary | ICD-10-CM | POA: Diagnosis not present

## 2018-07-15 DIAGNOSIS — L03213 Periorbital cellulitis: Secondary | ICD-10-CM | POA: Diagnosis present

## 2018-07-15 DIAGNOSIS — E785 Hyperlipidemia, unspecified: Secondary | ICD-10-CM | POA: Diagnosis present

## 2018-07-15 DIAGNOSIS — I482 Chronic atrial fibrillation, unspecified: Secondary | ICD-10-CM | POA: Diagnosis present

## 2018-07-15 DIAGNOSIS — R0902 Hypoxemia: Secondary | ICD-10-CM | POA: Diagnosis not present

## 2018-07-15 DIAGNOSIS — J189 Pneumonia, unspecified organism: Secondary | ICD-10-CM | POA: Diagnosis present

## 2018-07-15 DIAGNOSIS — N183 Chronic kidney disease, stage 3 (moderate): Secondary | ICD-10-CM | POA: Diagnosis present

## 2018-07-15 DIAGNOSIS — R404 Transient alteration of awareness: Secondary | ICD-10-CM | POA: Diagnosis not present

## 2018-07-15 DIAGNOSIS — F0281 Dementia in other diseases classified elsewhere with behavioral disturbance: Secondary | ICD-10-CM | POA: Diagnosis not present

## 2018-07-15 LAB — COMPREHENSIVE METABOLIC PANEL
ALT: 13 U/L (ref 0–44)
AST: 28 U/L (ref 15–41)
Albumin: 3.2 g/dL — ABNORMAL LOW (ref 3.5–5.0)
Alkaline Phosphatase: 63 U/L (ref 38–126)
Anion gap: 10 (ref 5–15)
BUN: 32 mg/dL — AB (ref 8–23)
CO2: 20 mmol/L — ABNORMAL LOW (ref 22–32)
Calcium: 8.4 mg/dL — ABNORMAL LOW (ref 8.9–10.3)
Chloride: 108 mmol/L (ref 98–111)
Creatinine, Ser: 1.53 mg/dL — ABNORMAL HIGH (ref 0.44–1.00)
GFR calc Af Amer: 35 mL/min — ABNORMAL LOW (ref >60)
GFR, EST NON AFRICAN AMERICAN: 30 mL/min — AB (ref >60)
Glucose, Bld: 127 mg/dL — ABNORMAL HIGH (ref 70–99)
Potassium: 3.7 mmol/L (ref 3.5–5.1)
Sodium: 138 mmol/L (ref 135–145)
Total Bilirubin: 0.5 mg/dL (ref 0.3–1.2)
Total Protein: 6.9 g/dL (ref 6.5–8.1)

## 2018-07-15 LAB — CBC WITH DIFFERENTIAL/PLATELET
ABSOLUTE MONOCYTES: 1327 {cells}/uL — AB (ref 200–950)
Basophils Absolute: 47 cells/uL (ref 0–200)
Basophils Relative: 0.3 %
Eosinophils Absolute: 142 cells/uL (ref 15–500)
Eosinophils Relative: 0.9 %
HCT: 30.4 % — ABNORMAL LOW (ref 35.0–45.0)
HEMOGLOBIN: 9.7 g/dL — AB (ref 11.7–15.5)
Lymphs Abs: 1074 cells/uL (ref 850–3900)
MCH: 31.4 pg (ref 27.0–33.0)
MCHC: 31.9 g/dL — AB (ref 32.0–36.0)
MCV: 98.4 fL (ref 80.0–100.0)
MPV: 11.3 fL (ref 7.5–12.5)
Monocytes Relative: 8.4 %
Neutro Abs: 13209 cells/uL — ABNORMAL HIGH (ref 1500–7800)
Neutrophils Relative %: 83.6 %
Platelets: 257 10*3/uL (ref 140–400)
RBC: 3.09 10*6/uL — ABNORMAL LOW (ref 3.80–5.10)
RDW: 13.1 % (ref 11.0–15.0)
Total Lymphocyte: 6.8 %
WBC: 15.8 10*3/uL — ABNORMAL HIGH (ref 3.8–10.8)

## 2018-07-15 LAB — CBC
HCT: 29.8 % — ABNORMAL LOW (ref 36.0–46.0)
Hemoglobin: 9.4 g/dL — ABNORMAL LOW (ref 12.0–15.0)
MCH: 31.2 pg (ref 26.0–34.0)
MCHC: 31.5 g/dL (ref 30.0–36.0)
MCV: 99 fL (ref 80.0–100.0)
Platelets: 233 10*3/uL (ref 150–400)
RBC: 3.01 MIL/uL — ABNORMAL LOW (ref 3.87–5.11)
RDW: 13.7 % (ref 11.5–15.5)
WBC: 14.5 10*3/uL — ABNORMAL HIGH (ref 4.0–10.5)
nRBC: 0 % (ref 0.0–0.2)

## 2018-07-15 LAB — URINALYSIS, ROUTINE W REFLEX MICROSCOPIC
Bilirubin Urine: NEGATIVE
Glucose, UA: NEGATIVE mg/dL
Ketones, ur: NEGATIVE mg/dL
LEUKOCYTE UA: NEGATIVE
Nitrite: NEGATIVE
Protein, ur: 30 mg/dL — AB
Specific Gravity, Urine: 1.014 (ref 1.005–1.030)
pH: 5 (ref 5.0–8.0)

## 2018-07-15 LAB — COMPLETE METABOLIC PANEL WITH GFR
AG Ratio: 1.2 (calc) (ref 1.0–2.5)
ALT: 4 U/L — AB (ref 6–29)
AST: 12 U/L (ref 10–35)
Albumin: 3.5 g/dL — ABNORMAL LOW (ref 3.6–5.1)
Alkaline phosphatase (APISO): 62 U/L (ref 37–153)
BUN/Creatinine Ratio: 23 (calc) — ABNORMAL HIGH (ref 6–22)
BUN: 33 mg/dL — ABNORMAL HIGH (ref 7–25)
CO2: 19 mmol/L — ABNORMAL LOW (ref 20–32)
Calcium: 9.2 mg/dL (ref 8.6–10.4)
Chloride: 108 mmol/L (ref 98–110)
Creat: 1.43 mg/dL — ABNORMAL HIGH (ref 0.60–0.88)
GFR, Est African American: 38 mL/min/{1.73_m2} — ABNORMAL LOW (ref >=60)
GFR, Est Non African American: 33 mL/min/{1.73_m2} — ABNORMAL LOW (ref >=60)
GLUCOSE: 84 mg/dL (ref 65–99)
Globulin: 2.9 g/dL (calc) (ref 1.9–3.7)
Potassium: 4.1 mmol/L (ref 3.5–5.3)
Sodium: 141 mmol/L (ref 135–146)
Total Bilirubin: 0.3 mg/dL (ref 0.2–1.2)
Total Protein: 6.4 g/dL (ref 6.1–8.1)

## 2018-07-15 LAB — PROTIME-INR
INR: 2.1 — ABNORMAL HIGH (ref 0.8–1.2)
Prothrombin Time: 23.1 seconds — ABNORMAL HIGH (ref 11.4–15.2)

## 2018-07-15 MED ORDER — SODIUM CHLORIDE 0.9 % IV SOLN
2.0000 g | INTRAVENOUS | Status: DC
  Administered 2018-07-15 – 2018-07-16 (×2): 2 g via INTRAVENOUS
  Filled 2018-07-15 (×2): qty 20

## 2018-07-15 MED ORDER — ASPIRIN 81 MG PO CHEW
81.0000 mg | CHEWABLE_TABLET | Freq: Every day | ORAL | Status: DC
  Administered 2018-07-16 – 2018-07-17 (×2): 81 mg via ORAL
  Filled 2018-07-15 (×2): qty 1

## 2018-07-15 MED ORDER — WARFARIN - PHYSICIAN DOSING INPATIENT
Freq: Every day | Status: DC
  Administered 2018-07-16: 17:00:00

## 2018-07-15 MED ORDER — FAMOTIDINE 20 MG PO TABS
20.0000 mg | ORAL_TABLET | Freq: Two times a day (BID) | ORAL | Status: DC
  Administered 2018-07-15 – 2018-07-16 (×2): 20 mg via ORAL
  Filled 2018-07-15 (×2): qty 1

## 2018-07-15 MED ORDER — SODIUM CHLORIDE 0.9 % IV SOLN
500.0000 mg | INTRAVENOUS | Status: DC
  Administered 2018-07-15 – 2018-07-16 (×2): 500 mg via INTRAVENOUS
  Filled 2018-07-15 (×2): qty 500

## 2018-07-15 MED ORDER — SODIUM CHLORIDE 0.9 % IV SOLN: 1.0000 g | Freq: Once | INTRAVENOUS | Status: DC

## 2018-07-15 MED ORDER — AMLODIPINE BESYLATE 5 MG PO TABS
10.0000 mg | ORAL_TABLET | Freq: Every day | ORAL | Status: DC
  Administered 2018-07-15 – 2018-07-17 (×3): 10 mg via ORAL
  Filled 2018-07-15 (×3): qty 2

## 2018-07-15 MED ORDER — VANCOMYCIN HCL IN DEXTROSE 1-5 GM/200ML-% IV SOLN
1000.0000 mg | Freq: Once | INTRAVENOUS | Status: AC
  Administered 2018-07-15: 1000 mg via INTRAVENOUS
  Filled 2018-07-15: qty 200

## 2018-07-15 MED ORDER — VANCOMYCIN HCL IN DEXTROSE 750-5 MG/150ML-% IV SOLN
750.0000 mg | INTRAVENOUS | Status: DC
  Administered 2018-07-16 – 2018-07-17 (×2): 750 mg via INTRAVENOUS
  Filled 2018-07-15: qty 150

## 2018-07-15 MED ORDER — PRAVASTATIN SODIUM 10 MG PO TABS
10.0000 mg | ORAL_TABLET | Freq: Every day | ORAL | Status: DC
  Administered 2018-07-15 – 2018-07-16 (×2): 10 mg via ORAL
  Filled 2018-07-15 (×2): qty 1

## 2018-07-15 MED ORDER — SODIUM CHLORIDE 0.9 % IV SOLN
INTRAVENOUS | Status: DC
  Administered 2018-07-15 – 2018-07-16 (×2): via INTRAVENOUS

## 2018-07-15 MED ORDER — METOPROLOL TARTRATE 25 MG PO TABS
25.0000 mg | ORAL_TABLET | Freq: Two times a day (BID) | ORAL | Status: DC
  Administered 2018-07-15 – 2018-07-17 (×4): 25 mg via ORAL
  Filled 2018-07-15 (×4): qty 1

## 2018-07-15 MED ORDER — SODIUM CHLORIDE 0.9 % IV BOLUS
1000.0000 mL | Freq: Once | INTRAVENOUS | Status: AC
  Administered 2018-07-15: 1000 mL via INTRAVENOUS

## 2018-07-15 MED ORDER — DONEPEZIL HCL 5 MG PO TABS
10.0000 mg | ORAL_TABLET | Freq: Every day | ORAL | Status: DC
  Administered 2018-07-16 – 2018-07-17 (×2): 10 mg via ORAL
  Filled 2018-07-15 (×2): qty 2

## 2018-07-15 MED ORDER — LORAZEPAM 1 MG PO TABS: 1.0000 mg | ORAL_TABLET | Freq: Every evening | ORAL | Status: DC | PRN

## 2018-07-15 MED ORDER — QUETIAPINE FUMARATE 25 MG PO TABS
50.0000 mg | ORAL_TABLET | Freq: Every day | ORAL | Status: DC
  Administered 2018-07-16 – 2018-07-17 (×2): 50 mg via ORAL
  Filled 2018-07-15 (×2): qty 2

## 2018-07-15 MED ORDER — WARFARIN SODIUM 2.5 MG PO TABS: 2.5000 mg | ORAL_TABLET | ORAL | Status: DC

## 2018-07-15 MED ORDER — PANTOPRAZOLE SODIUM 40 MG PO TBEC
40.0000 mg | DELAYED_RELEASE_TABLET | Freq: Every day | ORAL | Status: DC
  Administered 2018-07-15 – 2018-07-17 (×3): 40 mg via ORAL
  Filled 2018-07-15 (×3): qty 1

## 2018-07-15 MED ORDER — HYDROXYZINE HCL 10 MG PO TABS: 10.0000 mg | ORAL_TABLET | Freq: Three times a day (TID) | ORAL | Status: DC | PRN

## 2018-07-15 MED ORDER — WARFARIN SODIUM 2 MG PO TABS: 4.0000 mg | ORAL_TABLET | ORAL | Status: DC

## 2018-07-15 MED ORDER — WARFARIN SODIUM 2 MG PO TABS
4.0000 mg | ORAL_TABLET | ORAL | Status: DC
  Administered 2018-07-15 – 2018-07-16 (×2): 4 mg via ORAL
  Filled 2018-07-15 (×2): qty 2

## 2018-07-15 NOTE — H&P (Signed)
History and Physical  [name] ZOX:[id number] DOB: Jun 20, 1928 DOA: 07/15/2018  Referring physician: Dr Rubin Payor, ED physician PCP: [name], MD  Outpatient Specialists:   Patient Coming From: home  Chief Complaint: Swelling and redness around left eye and ear.  HPI: [name] is a 85 y.o. female with a history of dementia, atrial fibrillation on anticoagulation, stage III chronic kidney disease, hyperlipidemia, hypertension.  Patient has dementia so history was obtained by patient's daughter.  Patient started having irritation, swelling and redness on her left forehead, cheek, left ear and around her eye.  Patient was having some itching and irritation to that area.  She was seen on the office of her PCP on 3/11 patient was given a shot of Solu-Medrol and Benadryl as her PCP thought that this represented a pruritic dermatitis instead of a cellulitis.  She was given a steroid taper to go home with.  Patient was seen yesterday, it appeared that this had improved, however she continued to itch and scratch.  Topical Benadryl was given.  When the patient woke this morning, her symptoms were much worse the patient was brought here for evaluation.  Patient had not received any antibiotics during this time.  Possible cough, elevated temperatures.  Emergency Department Course: Maxillary CT is no acute orbital abnormality.  CT of the chest was also done which showed a bilateral pneumonia.  Review of Systems:  Unable to obtain secondary to dementia   Past Medical History:  Diagnosis Date   Anemia    Hemoglobin of 11.6 in 01/2010; 12.1 in 08/2010   Atrial fibrillation (HCC)    chronic requiring anticoagulation   Chronic anticoagulation    Chronic kidney disease    Stage III; creatinine of 1.7 in 04/2007 and 1.5 2009; 1.7 in 04/2010   Hyperlipidemia    Total cholesterol of 226, triglycerides of 90, HDL of 49 and LDL of 159 in 01/2010   Hypertension    Past Surgical History:    Procedure Laterality Date   APPENDECTOMY     BUNIONECTOMY  1995   PARTIAL HYSTERECTOMY     Social History:  reports that she has never smoked. She has never used smokeless tobacco. She reports that she does not drink alcohol or use drugs. Patient lives at home with daughter  No Known Allergies  Family History  Problem Relation Age of Onset   Diabetes Mother    Heart disease Father    Diabetes Sister    Diabetes Brother       Prior to Admission medications   Medication Sig Start Date End Date Taking? Authorizing Provider  famotidine (PEPCID) 20 MG tablet Take 1 tablet (20 mg total) by mouth 2 (two) times daily. For itching and reflux 07/12/18  Yes [place], Velna Hatchet, MD  hydrOXYzine (ATARAX/VISTARIL) 10 MG tablet Take 10 mg by mouth 3 (three) times daily as needed for itching.   Yes [provider]  LORazepam (ATIVAN) 1 MG tablet TAKE 1 TABLET BY MOUTH AT BEDTIME AS NEEDED FOR ANXIETY AND AGITATION Patient taking differently: Take 1 mg by mouth at bedtime as needed for anxiety (agitation).  04/05/18  Yes [place], Velna Hatchet, MD  pravastatin (PRAVACHOL) 10 MG tablet TAKE 1 TABLET BY MOUTH ONCE DAILY Patient taking differently: Take 10 mg by mouth daily.  12/06/17  Yes [place], Velna Hatchet, MD  predniSONE (DELTASONE) 10 MG tablet Take  x 3 days,20mg  x 3 days,  x 3 days 07/12/18  Yes [place], Velna Hatchet, MD  warfarin (COUMADIN) 2.5 MG tablet Take 1 tablet (2.5mg ) on Wednesdays only.  Takes  all other days 03/08/18  Yes Laqueta Linden, MD  warfarin (COUMADIN) 4 MG tablet Take  daily except 2.5mg  on Wednesdays and Sundays Patient taking differently: Take 4 mg by mouth See admin instructions. Take  daily except 2.5mg  on Wednesday 12/07/17  Yes Laqueta Linden, MD  amLODipine (NORVASC) 10 MG tablet TAKE ONE TABLET BY MOUTH ONCE DAILY 04/19/17   [name], MD  aspirin 81 MG tablet Take 81 mg by mouth daily.    [provider]   clotrimazole-betamethasone (LOTRISONE) cream Apply 1 application topically 2 (two) times daily. 10/26/16   [name], MD  desonide (DESOWEN) 0.05 % cream Apply topically 2 (two) times daily. 07/14/18   Donita Brooks, MD  donepezil (ARICEPT) 10 MG tablet Take 1 tablet (10 mg total) by mouth daily with breakfast. 02/22/18   Danelle Berry, PA-C  metoprolol tartrate (LOPRESSOR) 25 MG tablet Take 1 tablet (25 mg total) by mouth 2 (two) times daily. 02/22/18 02/22/19  Danelle Berry, PA-C  omeprazole (PRILOSEC) 40 MG capsule Take 40 mg by mouth daily.    [provider]  QUEtiapine (SEROQUEL) 50 MG tablet Take 1 tablet (50 mg total) by mouth daily with breakfast. 02/22/18   Danelle Berry, PA-C    Physical Exam: BP (!) 162/80    Pulse 82    Temp 99.7 F (37.6 C) (Oral)    Resp 20    Ht  (1.6 m)    Wt 68 kg    SpO2 98%    BMI 26.57 kg/m    General: Elderly female. Awake and alert and oriented x3. No acute cardiopulmonary distress.   HEENT: Normocephalic atraumatic.  Right and left ears normal in appearance.  Pupils equal, round, reactive to light. Extraocular muscles are intact. Sclerae anicteric and noninjected.  Moist mucosal membranes. No mucosal lesions.   Neck: Neck supple without lymphadenopathy. No carotid bruits. No masses palpated.   Cardiovascular: Irregular rate. No murmurs, rubs, gallops auscultated. No JVD.   Respiratory: Good respiratory effort.  Rales bilaterally no accessory muscle use.  Abdomen: Soft, nontender, nondistended. Active bowel sounds. No masses or hepatosplenomegaly   Skin: Redness and tenderness to the left forehead above her eye around to the zygoma and down cheek to the sides of her face and around the left ear. No rashes, lesions, or ulcerations.  Dry, warm to touch. 2+ dorsalis pedis and radial pulses.  Musculoskeletal: No calf or leg pain. All major joints not erythematous nontender.  No upper or lower joint deformation.  Good ROM.  No  contractures   Psychiatric: Intact judgment and insight. Pleasant and cooperative.  Neurologic: No focal neurological deficits. Strength is 5/5 and symmetric in upper and lower extremities.  Cranial nerves II through XII are grossly intact.           Labs on Admission: I have personally reviewed following labs and imaging studies  CBC: Recent Labs  Lab 07/14/18 1148 07/15/18 1541  WBC 15.8* 14.5*  NEUTROABS 13,209*  --   HGB 9.7* 9.4*  HCT 30.4* 29.8*  MCV 98.4 99.0  PLT 257 233   Basic Metabolic Panel: Recent Labs  Lab 07/14/18 1148 07/15/18 1541  NA 141 138  K 4.1 3.7  CL 108 108  CO2 19* 20*  GLUCOSE 84 127*  BUN 33* 32*  CREATININE 1.43* 1.53*  CALCIUM 9.2 8.4*   GFR: Estimated Creatinine  Clearance: 24 mL/min (A) (by C-G formula based on SCr of 1.53 mg/dL (H)). Liver Function Tests: Recent Labs  Lab 07/14/18 1148 07/15/18 1541  AST 12 28  ALT 4* 13  ALKPHOS  --  63  BILITOT 0.3 0.5  PROT 6.4 6.9  ALBUMIN  --  3.2*   No results for input(s): LIPASE, AMYLASE in the last 168 hours. No results for input(s): AMMONIA in the last 168 hours. Coagulation Profile: Recent Labs  Lab 07/15/18 1541  INR 2.1*   Cardiac Enzymes: No results for input(s): CKTOTAL, CKMB, CKMBINDEX, TROPONINI in the last 168 hours. BNP (last 3 results) No results for input(s): PROBNP in the last 8760 hours. HbA1C: No results for input(s): HGBA1C in the last 72 hours. CBG: No results for input(s): GLUCAP in the last 168 hours. Lipid Profile: No results for input(s): CHOL, HDL, LDLCALC, TRIG, CHOLHDL, LDLDIRECT in the last 72 hours. Thyroid Function Tests: No results for input(s): TSH, T4TOTAL, FREET4, T3FREE, THYROIDAB in the last 72 hours. Anemia Panel: No results for input(s): VITAMINB12, FOLATE, FERRITIN, TIBC, IRON, RETICCTPCT in the last 72 hours. Urine analysis:    Component Value Date/Time   COLORURINE YELLOW 07/15/2018 1344   APPEARANCEUR CLEAR 07/15/2018 1344    LABSPEC 1.014 07/15/2018 1344   PHURINE 5.0 07/15/2018 1344   GLUCOSEU NEGATIVE 07/15/2018 1344   HGBUR SMALL (A) 07/15/2018 1344   BILIRUBINUR NEGATIVE 07/15/2018 1344   BILIRUBINUR neg 04/13/2012 1612   KETONESUR NEGATIVE 07/15/2018 1344   PROTEINUR 30 (A) 07/15/2018 1344   UROBILINOGEN 0.2 10/04/2012 1514   NITRITE NEGATIVE 07/15/2018 1344   LEUKOCYTESUR NEGATIVE 07/15/2018 1344   Sepsis Labs: @LABRCNTIP (procalcitonin:4,lacticidven:4) ) Recent Results (from the past 240 hour(s))  Culture, blood (single)     Status: None (Preliminary result)   Collection Time: 07/15/18  3:41 PM  Result Value Ref Range Status   Specimen Description   Final    RIGHT ANTECUBITAL BOTTLES DRAWN AEROBIC AND ANAEROBIC   Special Requests   Final    Blood Culture adequate volume Performed at [facility], [address], [place], [place] [address]    Culture PENDING  Incomplete   Report Status PENDING  Incomplete     Radiological Exams on Admission: Ct Chest Wo Contrast  Result Date: 07/15/2018 CLINICAL DATA:  Right eye redness and drainage for 1 week. Clinical concern for sepsis and clinical concern for a thoracic neoplasm. EXAM: CT CHEST WITHOUT CONTRAST TECHNIQUE: Multidetector CT imaging of the chest was performed following the standard protocol without IV contrast. COMPARISON:  Left rib radiographs dated 09/28/2016 and chest radiographs dated 05/28/2010. FINDINGS: Cardiovascular: Atheromatous calcifications, including the coronary arteries and aorta. Diffuse low density of the arterial blood relative to the walls. Enlarged heart. Mediastinum/Nodes: Mildly enlarged mediastinal nodes including a superior mediastinal prevascular node with a short axis diameter of 11 mm on image number 58 series 2 and right pretracheal node with a short axis diameter of 12 mm on image number 48 series 2. The hilar nodes are difficult to assess due to lack of intravenous contrast. Normal appearing thyroid gland. Lungs/Pleura:  Small right pleural effusion. Extensive dense patchy airspace opacity in the right upper lobe. Less dense patchy airspace opacity in the right lower lobe. Clear left lung, remaining hyperexpanded with mild diffuse peribronchial thickening. Upper Abdomen: Atheromatous arterial calcifications without aneurysm. Musculoskeletal: Thoracic and cervical spine degenerative changes. Approximately 30% T12 superior endplate compression deformity with mild to moderate bony retropulsion. No acute fracture lines. An anterior fragment has  fused with the remainder the vertebral body. IMPRESSION: 1. Extensive right upper lobe pneumonia. 2. Less dense pneumonia in the right lower lobe. 3. Small right pleural effusion. 4. Mild mediastinal adenopathy, most likely reactive. 5. Changes of COPD and chronic bronchitis. 6. Cardiomegaly. 7.  Calcific coronary artery and aortic atherosclerosis. 8. Diffuse low density of the arterial blood relative to the walls. This can be seen with anemia. Aortic Atherosclerosis (ICD10-I70.0) and Emphysema (ICD10-J43.9). Electronically Signed   By: Beckie Salts M.D.   On: 07/15/2018 16:00   Ct Maxillofacial Wo Contrast  Result Date: 07/15/2018 CLINICAL DATA:  Right eye drainage and redness for over 1 week. Some relief with antibiotics and Benadryl. Increased right eye and face redness with vomiting 2 times today. EXAM: CT MAXILLOFACIAL WITHOUT CONTRAST TECHNIQUE: Multidetector CT imaging of the maxillofacial structures was performed. Multiplanar CT image reconstructions were also generated. COMPARISON:  None. FINDINGS: Osseous: Unremarkable facial bones. Cervical spine degenerative changes. Orbits: Negative. No traumatic or inflammatory finding. Sinuses: Right maxillary sinus retention cyst. Otherwise, clear. Soft tissues: Unremarkable. Limited intracranial: Moderately enlarged ventricles and subarachnoid spaces. Moderate patchy white matter low density in both cerebral hemispheres. IMPRESSION: 1. No  acute abnormality.  No orbital abnormality seen. 2. Moderate diffuse cerebral and cerebellar atrophy and moderate chronic small vessel white matter ischemic changes in both cerebral hemispheres. 3. Cervical spine degenerative changes. Electronically Signed   By: Beckie Salts M.D.   On: 07/15/2018 15:50    EKG: Independently reviewed..  Abnormal repolarization.  No acute ST changes.  Assessment/Plan: Principal Problem:   Periorbital cellulitis of left eye Active Problems:   Hyperlipidemia   Hypertension   ATRIAL FIBRILLATION, CHRONIC   Chronic anticoagulation   Dementia (HCC)   CAP (community acquired pneumonia)    This patient was discussed with the ED physician, including pertinent vitals, physical exam findings, labs, and imaging.  We also discussed care given by the ED provider.  1. Periorbital cellulitis a. Blood cultures pending b. On Vanco and Rocephin 2. Community-acquired pneumonia Antibiotics: Rocephin and azithromycin Robitussin Blood cultures drawn in the emergency department Sputum cultures CBC tomorrow Strep antigen by urine 3. Chronic A. Fib a. Continue home medications b. Rate control c. Continue Coumadin 4. Dementia a. On Aricept 5. Hypertension a. Continue antihypertensives 6. Hyperlipidemia  DVT prophylaxis: On Coumadin Consultants: None Code Status: Full code Family Communication: Daughter present during interview and exam Disposition Plan: Patient will need 2 to 3 days hospitalization for improvement.  Patient to return home allowing admission   Levie Heritage, DO

## 2018-07-15 NOTE — Progress Notes (Signed)
Pharmacy Antibiotic Note  [name] is a 85 y.o. female admitted on 07/15/2018 with periorbital cellulitis.  Pharmacy has been consulted for Vancomycin  dosing. Maxillary CT is no acute orbital abnormality.  CT of the chest was also done which showed a bilateral pneumonia.  Plan: Vancomycin 1000mg  given in ED, then 750mg  IV every 24 hours.  Goal trough 15-20 mcg/mL.  Also on ceftriaxone 2gm IV q24h and Azithromycin 500mg  IV q24h F/U cxs and clinical progress Monitor V/s, labs, and levels as indicated  Height: 5\' 3"  (160 cm) Weight: 150 lb (68 kg) IBW/kg (Calculated) : 52.4  Temp (24hrs), Avg:100.4 F (38 C), Min:99.7 F (37.6 C), Max:100.9 F (38.3 C)  Recent Labs  Lab 07/14/18 1148 07/15/18 1541  WBC 15.8* 14.5*  CREATININE 1.43* 1.53*    Estimated Creatinine Clearance: 24 mL/min (A) (by C-G formula based on SCr of 1.53 mg/dL (H)).    No Known Allergies  Antimicrobials this admission: Vancomycin 3/14 >>  Ceftriaxone 3/14 >>  azithromycin 3/14>>  Dose adjustments this admission: n/a  Microbiology results: 3/14 BCx: pending 3/14 UCx: pending  MRSA PCR:   Thank you for allowing pharmacy to be a part of this patient's care.  Elder Cyphers, BS Loura Back, [place] Clinical Pharmacist Pager [phone] 07/15/2018 6:56 PM

## 2018-07-15 NOTE — ED Notes (Signed)
Patient to CT.

## 2018-07-15 NOTE — ED Provider Notes (Signed)
[facility] Emergency Department Provider Note MRN:  [id number]  Arrival date & time: 07/15/18     Chief Complaint   Eye Drainage (right)   History of Present Illness   [name] is a 85 y.o. year-old female with a history of A. fib, CKD presenting to the ED with chief complaint of eye drainage.  Patient has been experiencing swelling and redness around the right eye for about 1 week.  Almost completed a course of antibiotics, without help.  Having 2 episodes of nonbloody nonbilious emesis today.  Foul-smelling urine also reported.  I was unable to obtain an accurate HPI, PMH, or ROS due to the patient's dementia.  Review of Systems  Positive for facial swelling, foul-smelling urine.  Patient's Health History    Past Medical History:  Diagnosis Date  . Anemia    Hemoglobin of 11.6 in 01/2010; 12.1 in 08/2010  . Atrial fibrillation (HCC)    chronic requiring anticoagulation  . Chronic anticoagulation   . Chronic kidney disease    Stage III; creatinine of 1.7 in 04/2007 and 1.5 2009; 1.7 in 04/2010  . Hyperlipidemia    Total cholesterol of 226, triglycerides of 90, HDL of 49 and LDL of 159 in 01/2010  . Hypertension     Past Surgical History:  Procedure Laterality Date  . APPENDECTOMY    . BUNIONECTOMY  1995  . PARTIAL HYSTERECTOMY      Family History  Problem Relation Age of Onset  . Diabetes Mother   . Heart disease Father   . Diabetes Sister   . Diabetes Brother     Social History   Socioeconomic History  . Marital status: Single    Spouse name: Not on file  . Number of children: Not on file  . Years of education: Not on file  . Highest education level: Not on file  Occupational History  . Occupation: Retired    Comment: CNA at a local SNF  Social Needs  . Financial resource strain: Not on file  . Food insecurity:    Worry: Not on file    Inability: Not on file  . Transportation needs:    Medical: Not on file    Non-medical: Not on  file  Tobacco Use  . Smoking status: Never Smoker  . Smokeless tobacco: Never Used  Substance and Sexual Activity  . Alcohol use: No    Alcohol/week: 0.0 standard drinks  . Drug use: No  . Sexual activity: Never  Lifestyle  . Physical activity:    Days per week: Not on file    Minutes per session: Not on file  . Stress: Not on file  Relationships  . Social connections:    Talks on phone: Not on file    Gets together: Not on file    Attends religious service: Not on file    Active member of club or organization: Not on file    Attends meetings of clubs or organizations: Not on file    Relationship status: Not on file  . Intimate partner violence:    Fear of current or ex partner: Not on file    Emotionally abused: Not on file    Physically abused: Not on file    Forced sexual activity: Not on file  Other Topics Concern  . Not on file  Social History Narrative  . Not on file     Physical Exam  Vital Signs and Nursing Notes reviewed Vitals:   07/15/18  1400 07/15/18 1421  BP: (!) 168/80 (!) 176/90  Pulse:  82  Resp:  20  Temp:  99.7 F (37.6 C)  SpO2:  98%    CONSTITUTIONAL: Chronically ill-appearing, NAD NEURO:  Alert and oriented x 3, no focal deficits EYES:  eyes equal and reactive, normal extraocular movements ENT/NECK:  no LAD, no JVD CARDIO: Regular rate, well-perfused, normal S1 and S2 PULM:  CTAB no wheezing or rhonchi GI/GU:  normal bowel sounds, non-distended, non-tender MSK/SPINE:  No gross deformities, no edema SKIN: Erythema and edema to the periorbital skin and soft tissue on the right PSYCH:  Appropriate speech and behavior  Diagnostic and Interventional Summary    EKG Interpretation  Date/Time:  Saturday July 15 2018 13:31:25 EDT Ventricular Rate:  100 PR Interval:    QRS Duration: 89 QT Interval:  330 QTC Calculation: 426 R Axis:   40 Text Interpretation:  Atrial fibrillation Anteroseptal infarct, old Nonspecific repol abnormality,  diffuse leads Confirmed by Kennis Carina [phone]) on 07/15/2018 2:11:36 PM      Labs Reviewed  URINALYSIS, ROUTINE W REFLEX MICROSCOPIC - Abnormal; Notable for the following components:      Result Value   Hgb urine dipstick SMALL (*)    Protein, ur 30 (*)    Bacteria, UA RARE (*)    All other components within normal limits  URINE CULTURE  CULTURE, BLOOD (SINGLE)  CBC  COMPREHENSIVE METABOLIC PANEL  PROTIME-INR    CT Maxillofacial Wo Contrast    (Results Pending)    Medications  vancomycin (VANCOCIN) IVPB 1000 mg/200 mL premix (has no administration in time range)  sodium chloride 0.9 % bolus 1,000 mL (0 mLs Intravenous Stopped 07/15/18 1521)     Procedures Critical Care Critical Care Documentation Critical care time provided by me (excluding procedures): 36 minutes  Condition necessitating critical care: sepsis  Components of critical care management: reviewing of prior records, laboratory and imaging interpretation, frequent re-examination and reassessment of vital signs, administration of IV fluids, IV antibiotics, discussion with consulting services     ED Course and Medical Decision Making  I have reviewed the triage vital signs and the nursing notes.  Pertinent labs & imaging results that were available during my care of the patient were reviewed by me and considered in my medical decision making (see below for details).  Concern for sepsis, soft tissue versus urinary origin in this 85 year old female with clinically evident periorbital cellulitis, will CT to exclude orbital involvement.  Patient also with foul-smelling urine, fever here, mild tachycardia, will admit to hospitalist service.   Elmer Sow. Pilar Plate, MD [facility] Emergency Medicine [facility] [email or web address]  Final Clinical Impressions(s) / ED Diagnoses     ICD-10-CM   1. Cellulitis of right orbital region H05.011     ED Discharge Orders    None         Sabas Sous,  MD 07/15/18 1527

## 2018-07-15 NOTE — ED Triage Notes (Addendum)
Family called EMS due to right eye drainage and redness for over one week.  Seen by PCP, prescribed antibiotics and Benadryl with some relief.   Family reports increased redness to right eye and face with vomiting two times beginning today.  Patient given 1g Rocephin IM en route.

## 2018-07-16 ENCOUNTER — Other Ambulatory Visit: Payer: Self-pay

## 2018-07-16 ENCOUNTER — Encounter (HOSPITAL_COMMUNITY): Payer: Self-pay | Admitting: *Deleted

## 2018-07-16 LAB — CBC
HCT: 24.1 % — ABNORMAL LOW (ref 36.0–46.0)
Hemoglobin: 7.9 g/dL — ABNORMAL LOW (ref 12.0–15.0)
MCH: 32.1 pg (ref 26.0–34.0)
MCHC: 32.8 g/dL (ref 30.0–36.0)
MCV: 98 fL (ref 80.0–100.0)
Platelets: 208 10*3/uL (ref 150–400)
RBC: 2.46 MIL/uL — ABNORMAL LOW (ref 3.87–5.11)
RDW: 13.7 % (ref 11.5–15.5)
WBC: 13.1 10*3/uL — ABNORMAL HIGH (ref 4.0–10.5)
nRBC: 0 % (ref 0.0–0.2)

## 2018-07-16 LAB — PROTIME-INR
INR: 2.5 — ABNORMAL HIGH (ref 0.8–1.2)
PROTHROMBIN TIME: 26.9 s — AB (ref 11.4–15.2)

## 2018-07-16 MED ORDER — FAMOTIDINE 20 MG PO TABS
20.0000 mg | ORAL_TABLET | Freq: Every day | ORAL | Status: DC
  Administered 2018-07-16: 20 mg via ORAL
  Filled 2018-07-16: qty 1

## 2018-07-16 MED ORDER — ACETAMINOPHEN 325 MG PO TABS
650.0000 mg | ORAL_TABLET | Freq: Four times a day (QID) | ORAL | Status: DC | PRN
  Administered 2018-07-16 – 2018-07-17 (×2): 650 mg via ORAL
  Filled 2018-07-16 (×2): qty 2

## 2018-07-16 NOTE — Progress Notes (Signed)
2040 patient asking for tylenol for head ache.  Message sent to E. Emokpae at 2041.  Emokpae still has not responded back it is now 2140.  Sending message to another  MD.

## 2018-07-16 NOTE — Progress Notes (Signed)
PROGRESS NOTE    Isabel SCHRANZ  HXT:[id number] DOB: 1928/07/30 DOA: 07/15/2018 PCP: [name], MD   Brief Narrative:  Per HPI: [name] is a 85 y.o. female with a history of dementia, atrial fibrillation on anticoagulation, stage III chronic kidney disease, hyperlipidemia, hypertension.  Patient has dementia so history was obtained by patient's daughter.  Patient started having irritation, swelling and redness on her left forehead, cheek, left ear and around her eye.  Patient was having some itching and irritation to that area.  She was seen on the office of her PCP on 3/11 patient was given a shot of Solu-Medrol and Benadryl as her PCP thought that this represented a pruritic dermatitis instead of a cellulitis.  She was given a steroid taper to go home with.  Patient was seen yesterday, it appeared that this had improved, however she continued to itch and scratch.  Topical Benadryl was given.  When the patient woke this morning, her symptoms were much worse the patient was brought here for evaluation.  Patient had not received any antibiotics during this time.  Possible cough, elevated temperatures.  Patient was admitted with periorbital cellulitis as well as community-acquired pneumonia and has been started on vancomycin, Rocephin, and azithromycin.  She appears to be in stable condition this morning.  Cultures are pending.  Assessment & Plan:   Principal Problem:   Periorbital cellulitis of left eye Active Problems:   Hyperlipidemia   Hypertension   ATRIAL FIBRILLATION, CHRONIC   Chronic anticoagulation   Dementia (HCC)   CAP (community acquired pneumonia)   1. Periorbital cellulitis a. Blood cultures pending b. On Vanco and Rocephin 2. Community-acquired pneumonia a. Antibiotics: Rocephin and azithromycin b. Robitussin c. Blood cultures drawn in the emergency department d. Sputum cultures e. CBC tomorrow f. Strep antigen by urine pending 3. Chronic A.  Fib-stable a. Continue home medications b. Rate control c. Continue Coumadin 4. Dementia a. On Aricept 5. Hypertension a. Continue antihypertensives 6. Hyperlipidemia 7. Anemia-repeat CBC in am and check anemia panel for now; no overt bleeding noted    DVT prophylaxis: Coumadin Code Status: Full Family Communication: Daughter at bedside Disposition Plan: Continue IV antibiotics as noted and monitor blood cultures and clinical progression.   Consultants:   None  Procedures:   None  Antimicrobials:   Rocephin, azithromycin, and vancomycin 3/14->   Subjective: Patient seen and evaluated today with no new acute complaints or concerns. No acute concerns or events noted overnight.  Objective: Vitals:   07/15/18 2000 07/15/18 2009 07/16/18 0456 07/16/18 0854  BP: (!) 145/58  (!) 160/75   Pulse: 61  61   Resp: 18     Temp: 98.1 F (36.7 C)  97.7 F (36.5 C)   TempSrc: Oral  Oral   SpO2: 99% 98% 94% 94%  Weight:      Height:        Intake/Output Summary (Last 24 hours) at 07/16/2018 1048 Last data filed at 07/16/2018 0900 Gross per 24 hour  Intake 2150.78 ml  Output --  Net 2150.78 ml   Filed Weights   07/15/18 1331  Weight: 68 kg    Examination:  General exam: Appears calm and comfortable, hard of hearing. Respiratory system: Clear to auscultation. Respiratory effort normal.  Currently on nasal cannula oxygen. Cardiovascular system: S1 & S2 heard, RRR. No JVD, murmurs, rubs, gallops or clicks. No pedal edema. Gastrointestinal system: Abdomen is nondistended, soft and nontender. No organomegaly or masses felt. Normal bowel  sounds heard. Central nervous system: Alert and awake. Extremities: Symmetric 5 x 5 power. Skin: No rashes, lesions or ulcers, cellulitis appears to be improved this morning. Psychiatry: Cannot be determined.    Data Reviewed: I have personally reviewed following labs and imaging studies  CBC: Recent Labs  Lab 07/14/18 1148  07/15/18 1541 07/16/18 0717  WBC 15.8* 14.5* 13.1*  NEUTROABS 13,209*  --   --   HGB 9.7* 9.4* 7.9*  HCT 30.4* 29.8* 24.1*  MCV 98.4 99.0 98.0  PLT 257 233 208   Basic Metabolic Panel: Recent Labs  Lab 07/14/18 1148 07/15/18 1541  NA 141 138  K 4.1 3.7  CL 108 108  CO2 19* 20*  GLUCOSE 84 127*  BUN 33* 32*  CREATININE 1.43* 1.53*  CALCIUM 9.2 8.4*   GFR: Estimated Creatinine Clearance: 24 mL/min (A) (by C-G formula based on SCr of 1.53 mg/dL (H)). Liver Function Tests: Recent Labs  Lab 07/14/18 1148 07/15/18 1541  AST 12 28  ALT 4* 13  ALKPHOS  --  63  BILITOT 0.3 0.5  PROT 6.4 6.9  ALBUMIN  --  3.2*   No results for input(s): LIPASE, AMYLASE in the last 168 hours. No results for input(s): AMMONIA in the last 168 hours. Coagulation Profile: Recent Labs  Lab 07/15/18 1541 07/16/18 0717  INR 2.1* 2.5*   Cardiac Enzymes: No results for input(s): CKTOTAL, CKMB, CKMBINDEX, TROPONINI in the last 168 hours. BNP (last 3 results) No results for input(s): PROBNP in the last 8760 hours. HbA1C: No results for input(s): HGBA1C in the last 72 hours. CBG: No results for input(s): GLUCAP in the last 168 hours. Lipid Profile: No results for input(s): CHOL, HDL, LDLCALC, TRIG, CHOLHDL, LDLDIRECT in the last 72 hours. Thyroid Function Tests: No results for input(s): TSH, T4TOTAL, FREET4, T3FREE, THYROIDAB in the last 72 hours. Anemia Panel: No results for input(s): VITAMINB12, FOLATE, FERRITIN, TIBC, IRON, RETICCTPCT in the last 72 hours. Sepsis Labs: No results for input(s): PROCALCITON, LATICACIDVEN in the last 168 hours.  Recent Results (from the past 240 hour(s))  Culture, blood (single)     Status: None (Preliminary result)   Collection Time: 07/15/18  3:41 PM  Result Value Ref Range Status   Specimen Description   Final    RIGHT ANTECUBITAL BOTTLES DRAWN AEROBIC AND ANAEROBIC   Special Requests   Final    Blood Culture adequate volume Performed at [facility], [address], [place], [place] [address]    Culture PENDING  Incomplete   Report Status PENDING  Incomplete  Culture, blood (routine x 2)     Status: None (Preliminary result)   Collection Time: 07/15/18  6:36 PM  Result Value Ref Range Status   Specimen Description BLOOD LEFT HAND  Final   Special Requests   Final    BOTTLES DRAWN AEROBIC ONLY Blood Culture results may not be optimal due to an inadequate volume of blood received in culture bottles Performed at [facility], [address] [place], [place] [address]    Culture PENDING  Incomplete   Report Status PENDING  Incomplete         Radiology Studies: Ct Chest Wo Contrast  Result Date: 07/15/2018 CLINICAL DATA:  Right eye redness and drainage for 1 week. Clinical concern for sepsis and clinical concern for a thoracic neoplasm. EXAM: CT CHEST WITHOUT CONTRAST TECHNIQUE: Multidetector CT imaging of the chest was performed following the standard protocol without IV contrast. COMPARISON:  Left rib radiographs  dated 09/28/2016 and chest radiographs dated 05/28/2010. FINDINGS: Cardiovascular: Atheromatous calcifications, including the coronary arteries and aorta. Diffuse low density of the arterial blood relative to the walls. Enlarged heart. Mediastinum/Nodes: Mildly enlarged mediastinal nodes including a superior mediastinal prevascular node with a short axis diameter of 11 mm on image number 58 series 2 and right pretracheal node with a short axis diameter of 12 mm on image number 48 series 2. The hilar nodes are difficult to assess due to lack of intravenous contrast. Normal appearing thyroid gland. Lungs/Pleura: Small right pleural effusion. Extensive dense patchy airspace opacity in the right upper lobe. Less dense patchy airspace opacity in the right lower lobe. Clear left lung, remaining hyperexpanded with mild diffuse peribronchial thickening. Upper Abdomen: Atheromatous arterial calcifications without aneurysm. Musculoskeletal:  Thoracic and cervical spine degenerative changes. Approximately 30% T12 superior endplate compression deformity with mild to moderate bony retropulsion. No acute fracture lines. An anterior fragment has fused with the remainder the vertebral body. IMPRESSION: 1. Extensive right upper lobe pneumonia. 2. Less dense pneumonia in the right lower lobe. 3. Small right pleural effusion. 4. Mild mediastinal adenopathy, most likely reactive. 5. Changes of COPD and chronic bronchitis. 6. Cardiomegaly. 7.  Calcific coronary artery and aortic atherosclerosis. 8. Diffuse low density of the arterial blood relative to the walls. This can be seen with anemia. Aortic Atherosclerosis (ICD10-I70.0) and Emphysema (ICD10-J43.9). Electronically Signed   By: Beckie Salts M.D.   On: 07/15/2018 16:00   Ct Maxillofacial Wo Contrast  Result Date: 07/15/2018 CLINICAL DATA:  Right eye drainage and redness for over 1 week. Some relief with antibiotics and Benadryl. Increased right eye and face redness with vomiting 2 times today. EXAM: CT MAXILLOFACIAL WITHOUT CONTRAST TECHNIQUE: Multidetector CT imaging of the maxillofacial structures was performed. Multiplanar CT image reconstructions were also generated. COMPARISON:  None. FINDINGS: Osseous: Unremarkable facial bones. Cervical spine degenerative changes. Orbits: Negative. No traumatic or inflammatory finding. Sinuses: Right maxillary sinus retention cyst. Otherwise, clear. Soft tissues: Unremarkable. Limited intracranial: Moderately enlarged ventricles and subarachnoid spaces. Moderate patchy white matter low density in both cerebral hemispheres. IMPRESSION: 1. No acute abnormality.  No orbital abnormality seen. 2. Moderate diffuse cerebral and cerebellar atrophy and moderate chronic small vessel white matter ischemic changes in both cerebral hemispheres. 3. Cervical spine degenerative changes. Electronically Signed   By: Beckie Salts M.D.   On: 07/15/2018 15:50        Scheduled  Meds:  amLODipine  10 mg Oral Daily   aspirin  81 mg Oral Daily   donepezil  10 mg Oral Q breakfast   famotidine  20 mg Oral BID   metoprolol tartrate  25 mg Oral BID   pantoprazole  40 mg Oral Daily   pravastatin  10 mg Oral q1800   QUEtiapine  50 mg Oral Q breakfast   warfarin  4 mg Oral Once per day on Sun Mon Tue Thu Fri Sat   And   [START ON 07/19/2018] warfarin  2.5 mg Oral Q Wed-1800   Warfarin - Physician Dosing Inpatient   Does not apply q1800   Continuous Infusions:  sodium chloride 100 mL/hr at 07/16/18 0924   azithromycin 500 mg (07/15/18 1904)   cefTRIAXone (ROCEPHIN)  IV 2 g (07/15/18 1816)   vancomycin 750 mg (07/16/18 0941)     LOS: 1 day    Time spent: 30 minutes    Fransisco Messmer Hoover Brunette, DO [facility] Pager [phone]  If 7PM-7AM, please contact night-coverage [email or web address] Password [email or web address]  07/16/2018, 10:48 AM

## 2018-07-17 ENCOUNTER — Encounter: Payer: Self-pay | Admitting: Family Medicine

## 2018-07-17 DIAGNOSIS — I4891 Unspecified atrial fibrillation: Secondary | ICD-10-CM

## 2018-07-17 DIAGNOSIS — E785 Hyperlipidemia, unspecified: Secondary | ICD-10-CM

## 2018-07-17 DIAGNOSIS — G309 Alzheimer's disease, unspecified: Secondary | ICD-10-CM

## 2018-07-17 DIAGNOSIS — I5032 Chronic diastolic (congestive) heart failure: Secondary | ICD-10-CM

## 2018-07-17 DIAGNOSIS — I10 Essential (primary) hypertension: Secondary | ICD-10-CM

## 2018-07-17 DIAGNOSIS — F0281 Dementia in other diseases classified elsewhere with behavioral disturbance: Secondary | ICD-10-CM

## 2018-07-17 DIAGNOSIS — H05011 Cellulitis of right orbit: Secondary | ICD-10-CM

## 2018-07-17 DIAGNOSIS — J181 Lobar pneumonia, unspecified organism: Secondary | ICD-10-CM

## 2018-07-17 DIAGNOSIS — Z7901 Long term (current) use of anticoagulants: Secondary | ICD-10-CM

## 2018-07-17 LAB — CBC
HCT: 31.8 % — ABNORMAL LOW (ref 36.0–46.0)
Hemoglobin: 10 g/dL — ABNORMAL LOW (ref 12.0–15.0)
MCH: 31.3 pg (ref 26.0–34.0)
MCHC: 31.4 g/dL (ref 30.0–36.0)
MCV: 99.4 fL (ref 80.0–100.0)
Platelets: 241 10*3/uL (ref 150–400)
RBC: 3.2 MIL/uL — ABNORMAL LOW (ref 3.87–5.11)
RDW: 13.7 % (ref 11.5–15.5)
WBC: 13.8 10*3/uL — ABNORMAL HIGH (ref 4.0–10.5)
nRBC: 0 % (ref 0.0–0.2)

## 2018-07-17 LAB — BASIC METABOLIC PANEL
Anion gap: 10 (ref 5–15)
BUN: 30 mg/dL — ABNORMAL HIGH (ref 8–23)
CO2: 18 mmol/L — ABNORMAL LOW (ref 22–32)
Calcium: 8.6 mg/dL — ABNORMAL LOW (ref 8.9–10.3)
Chloride: 113 mmol/L — ABNORMAL HIGH (ref 98–111)
Creatinine, Ser: 1.39 mg/dL — ABNORMAL HIGH (ref 0.44–1.00)
GFR calc Af Amer: 39 mL/min — ABNORMAL LOW (ref >60)
GFR calc non Af Amer: 34 mL/min — ABNORMAL LOW (ref >60)
GLUCOSE: 98 mg/dL (ref 70–99)
Potassium: 3.2 mmol/L — ABNORMAL LOW (ref 3.5–5.1)
Sodium: 141 mmol/L (ref 135–145)

## 2018-07-17 LAB — URINE CULTURE: Culture: NO GROWTH

## 2018-07-17 LAB — PROTIME-INR
INR: 2.8 — ABNORMAL HIGH (ref 0.8–1.2)
Prothrombin Time: 29.2 seconds — ABNORMAL HIGH (ref 11.4–15.2)

## 2018-07-17 MED ORDER — OMEPRAZOLE 40 MG PO CPDR: 40.0000 mg | DELAYED_RELEASE_CAPSULE | Freq: Every day | ORAL | Status: DC

## 2018-07-17 MED ORDER — AMLODIPINE BESYLATE 10 MG PO TABS: 10.0000 mg | ORAL_TABLET | Freq: Every day | ORAL | 2 refills | Status: DC

## 2018-07-17 MED ORDER — DOXYCYCLINE HYCLATE 100 MG PO TABS: 100.0000 mg | ORAL_TABLET | Freq: Two times a day (BID) | ORAL | 0 refills | Status: AC

## 2018-07-17 MED ORDER — ASPIRIN 81 MG PO CHEW: 81.0000 mg | CHEWABLE_TABLET | Freq: Every day | ORAL | 1 refills | Status: DC

## 2018-07-17 MED ORDER — HYDROXYZINE HCL 10 MG PO TABS: 10.0000 mg | ORAL_TABLET | Freq: Three times a day (TID) | ORAL | Status: DC | PRN

## 2018-07-17 NOTE — Progress Notes (Addendum)
Patient's o2 sat at rest on room air is 88%, with 2 liters nasal cannula at rest patient's o2 sat is 97%.

## 2018-07-17 NOTE — TOC Transition Note (Signed)
Transition of Care [facility]) - CM/SW Discharge Note   Patient Details  Name: [name] MRN: [id number] Date of Birth: [birth year]  [facility] of [facility]) CM/SW Contact:  Malcolm Metro, RN Phone Number: 07/17/2018, 5:01 PM   Clinical Narrative:   DC home today with home O2. Referral sent to [employer]. Port tank will be delivered to pt home prior to DC. Family will transport via care.    Final next level of care: Home/Self Care     Patient Goals and CMS Choice Patient states their goals for this hospitalization and ongoing recovery are:: do well at home CMS Medicare.gov Compare Post Acute Care list provided to:: Patient Represenative (must comment) Choice offered to / list presented to : Adult Children    Discharge Plan and Services   Post Acute Care Choice: Durable Medical Equipment          DME Arranged: Oxygen DME Agency: [facility]      Readmission Risk Interventions Readmission Risk Prevention Plan 07/17/2018  Transportation Screening Complete  PCP or Specialist Appt within 5-7 Days Not Complete  Not Complete comments DC late in day. CM discussed making f/u with duaghter  Home Care Screening Complete  Medication Review (RN CM) Complete  Some recent data might be hidden

## 2018-07-17 NOTE — Discharge Summary (Signed)
Physician Discharge Summary  [name] WKG:[id number] DOB: 06-28-1928 DOA: 07/15/2018  PCP: [name], MD  Admit date: 07/15/2018 Discharge date: 07/17/2018  Time spent: 35 minutes  Recommendations for Outpatient Follow-up:  Repeat CBC to follow WBCs trend Repeat chest x-ray in 4-6 weeks to assure complete resolution of infiltrates. Reassess complete resolution of patient's facial cellulitis symptoms. Reassess need for oxygen supplementation and continue weaning process as tolerated.  Patient discharged on 2 L nasal cannula supplementation.  Discharge Diagnoses:  Principal Problem:   Periorbital cellulitis of left eye Active Problems:   Hyperlipidemia   Hypertension   ATRIAL FIBRILLATION, CHRONIC   Chronic anticoagulation   Dementia (HCC)   CAP (community acquired pneumonia)   Cellulitis of right orbital region chronic diastolic HF  Discharge Condition: Stable and improved.  Patient discharged home with instructions to follow-up with PCP in 10 days.  Diet recommendation: Heart healthy diet  Filed Weights   07/15/18 1331  Weight: 68 kg    History of present illness:  As per H&P written by Dr. Adrian Blackwater on 07/15/2018 85 y.o. female with a history of dementia, chronic diastolic HF, atrial fibrillation on anticoagulation, stage III chronic kidney disease, hyperlipidemia, hypertension.  Patient has dementia so history was obtained by patient's daughter.  Patient started having irritation, swelling and redness on her left forehead, cheek, left ear and around her eye.  Patient was having some itching and irritation to that area. She was seen on the office of her PCP on 3/11 patient was given a shot of Solu-Medrol and Benadryl as her PCP thought that this represented a pruritic dermatitis instead of a cellulitis.  She was given a steroid taper to go home with.   When the patient woke up on the morning of admission, her symptoms were much worse and the patient was brought here for  evaluation.  Patient had not received any antibiotics during this time.  suspected non-productive cough and elevated temperatures by daughter (no measured). Also reported couple episodes of non-bloody emesis.  Hospital Course:  1-periorbital cellulitis -Blood cultures has demonstrated no growth Patient afebrile Significant improvement after initiation of antibiotics Steroid has been discontinued at discharge -Patient sent home with the use of doxycycline twice a day for another 10 days Instructed to keep herself well-hydrated and to use moisturizing lotion/Vaseline to maintain adequate skin hydration and assist with dry skin/itching.  2-acute respiratory failure with hypoxia due to community-acquired pneumonia -Patient has required the use of 2 L oxygen supplementation at time of discharge -Scattered a nonproductive cough appreciated. -No fever. -She will be treated with the use of doxycycline as mentioned above. -Negative for strep antigen in urine. -Blood cultures and sputum cultures remain negative at time of discharge. -Will recommend repeat x-rays in 4-6 weeks after completing treatment to assure resolution of infiltrates.  3-chronic atrial fibrillation -Stable -Continue Coumadin for secondary prevention -Continue current medication for rate control.  4-dementia -Continue supportive care -Continue Aricept. -No behavioral disturbances appreciated at discharge.  5-hypertension -Is stable and well-controlled -Continue current antihypertensive regimen -Heart healthy diet recommended.  6-hyperlipidemia -Continue statins.  7-elevated WBCs -In the setting of recent steroid usage and current infection -Trending down appropriately -Complete antibiotic therapy and repeat CBC at follow-up visit.  8-chronic diastolic heart failure -Most likely in the setting of chronic atrial fibrillation and hypertension -Compensated -Follow low-sodium diet -Continue current antihypertensive  regimen -Check weight on daily basis.  Procedures:  See below for x-ray report  Consultations:  None  Discharge Exam:  Vitals:   07/17/18 0937 07/17/18 1410  BP: (!) 185/71 (!) 147/58  Pulse: 72 68  Resp:  18  Temp:  98.8 F (37.1 C)  SpO2:  96%    General: Afebrile, no chest pain, no nausea, no vomiting.  Pleasantly confused and in no acute distress. Cardiovascular: S1 and S2, no rubs, no gallops, no JVD Respiratory: Good air movement bilaterally, positive scattered rhonchi, no wheezing; using 2 L nasal cannula supplementation with good O2 sat. Abdomen: Soft, nontender, nondistended, positive bowel sounds Extremities: No edema, no cyanosis, no clubbing. Skin: Mild erythematous changes affecting forehead and cheeks, significant resolution of her facial swelling and no open wounds or drainage appreciated on exam.  Discharge Instructions   Discharge Instructions    Diet - low sodium heart healthy   Complete by:  As directed    Discharge instructions   Complete by:  As directed    Keep yourself well-hydrated Take medications as prescribed Avoid touching/scratching face Complete antibiotics as instructed Arrange follow-up with PCP in 10 days. Use Vaseline or any other moisturizing lotion to assist with dry skin.     Allergies as of 07/17/2018   No Known Allergies     Medication List    STOP taking these medications   aspirin 81 MG tablet Replaced by:  aspirin 81 MG chewable tablet   desonide 0.05 % cream Commonly known as:  DesOwen   predniSONE 10 MG tablet Commonly known as:  DELTASONE     TAKE these medications   amLODipine 10 MG tablet Commonly known as:  NORVASC Take 1 tablet (10 mg total) by mouth daily.   aspirin 81 MG chewable tablet Chew 1 tablet (81 mg total) by mouth daily. Start taking on:  July 18, 2018 Replaces:  aspirin 81 MG tablet   donepezil 10 MG tablet Commonly known as:  ARICEPT Take 1 tablet (10 mg total) by mouth daily with  breakfast.   doxycycline 100 MG tablet Commonly known as:  VIBRA-TABS Take 1 tablet (100 mg total) by mouth 2 (two) times daily for 10 days.   famotidine 20 MG tablet Commonly known as:  Pepcid Take 1 tablet (20 mg total) by mouth 2 (two) times daily. For itching and reflux   hydrOXYzine 10 MG tablet Commonly known as:  ATARAX/VISTARIL Take 1 tablet (10 mg total) by mouth 3 (three) times daily as needed for itching.   LORazepam 1 MG tablet Commonly known as:  ATIVAN TAKE 1 TABLET BY MOUTH AT BEDTIME AS NEEDED FOR ANXIETY AND AGITATION What changed:    how much to take  how to take this  when to take this  reasons to take this  additional instructions   metoprolol tartrate 25 MG tablet Commonly known as:  LOPRESSOR Take 1 tablet (25 mg total) by mouth 2 (two) times daily.   omeprazole 40 MG capsule Commonly known as:  PRILOSEC Take 1 capsule (40 mg total) by mouth daily.   pravastatin 10 MG tablet Commonly known as:  PRAVACHOL TAKE 1 TABLET BY MOUTH ONCE DAILY   QUEtiapine 50 MG tablet Commonly known as:  SEROQUEL Take 1 tablet (50 mg total) by mouth daily with breakfast.   warfarin 4 MG tablet Commonly known as:  COUMADIN Take as directed. If you are unsure how to take this medication, talk to your nurse or doctor. Original instructions:  Take  daily except 2.5mg  on Wednesdays and Sundays What changed:    how much to take  how to take  this  when to take this  additional instructions   warfarin 2.5 MG tablet Commonly known as:  COUMADIN Take as directed. If you are unsure how to take this medication, talk to your nurse or doctor. Original instructions:  Take 1 tablet (2.5mg ) on Wednesdays only.  Takes  all other days What changed:  Another medication with the same name was changed. Make sure you understand how and when to take each.            Durable Medical Equipment  (From admission, onward)         Start     Ordered   07/17/18 1448   For home use only DME oxygen  Once    Question Answer Comment  Mode or (Route) Nasal cannula   Liters per Minute 2   Frequency Continuous (stationary and portable oxygen unit needed)   Oxygen conserving device Yes   Oxygen delivery system Gas      07/17/18 1447         No Known Allergies Follow-up Information    [place], Velna Hatchet, MD. Schedule an appointment as soon as possible for a visit in 10 day(s).   Specialty:  Family Medicine Contact information: [address] [place] [address] [phone]            The results of significant diagnostics from this hospitalization (including imaging, microbiology, ancillary and laboratory) are listed below for reference.    Significant Diagnostic Studies: Ct Chest Wo Contrast  Result Date: 07/15/2018 CLINICAL DATA:  Right eye redness and drainage for 1 week. Clinical concern for sepsis and clinical concern for a thoracic neoplasm. EXAM: CT CHEST WITHOUT CONTRAST TECHNIQUE: Multidetector CT imaging of the chest was performed following the standard protocol without IV contrast. COMPARISON:  Left rib radiographs dated 09/28/2016 and chest radiographs dated 05/28/2010. FINDINGS: Cardiovascular: Atheromatous calcifications, including the coronary arteries and aorta. Diffuse low density of the arterial blood relative to the walls. Enlarged heart. Mediastinum/Nodes: Mildly enlarged mediastinal nodes including a superior mediastinal prevascular node with a short axis diameter of 11 mm on image number 58 series 2 and right pretracheal node with a short axis diameter of 12 mm on image number 48 series 2. The hilar nodes are difficult to assess due to lack of intravenous contrast. Normal appearing thyroid gland. Lungs/Pleura: Small right pleural effusion. Extensive dense patchy airspace opacity in the right upper lobe. Less dense patchy airspace opacity in the right lower lobe. Clear left lung, remaining hyperexpanded with mild diffuse  peribronchial thickening. Upper Abdomen: Atheromatous arterial calcifications without aneurysm. Musculoskeletal: Thoracic and cervical spine degenerative changes. Approximately 30% T12 superior endplate compression deformity with mild to moderate bony retropulsion. No acute fracture lines. An anterior fragment has fused with the remainder the vertebral body. IMPRESSION: 1. Extensive right upper lobe pneumonia. 2. Less dense pneumonia in the right lower lobe. 3. Small right pleural effusion. 4. Mild mediastinal adenopathy, most likely reactive. 5. Changes of COPD and chronic bronchitis. 6. Cardiomegaly. 7.  Calcific coronary artery and aortic atherosclerosis. 8. Diffuse low density of the arterial blood relative to the walls. This can be seen with anemia. Aortic Atherosclerosis (ICD10-I70.0) and Emphysema (ICD10-J43.9). Electronically Signed   By: Beckie Salts M.D.   On: 07/15/2018 16:00   Ct Maxillofacial Wo Contrast  Result Date: 07/15/2018 CLINICAL DATA:  Right eye drainage and redness for over 1 week. Some relief with antibiotics and Benadryl. Increased right eye and face redness with vomiting 2 times  today. EXAM: CT MAXILLOFACIAL WITHOUT CONTRAST TECHNIQUE: Multidetector CT imaging of the maxillofacial structures was performed. Multiplanar CT image reconstructions were also generated. COMPARISON:  None. FINDINGS: Osseous: Unremarkable facial bones. Cervical spine degenerative changes. Orbits: Negative. No traumatic or inflammatory finding. Sinuses: Right maxillary sinus retention cyst. Otherwise, clear. Soft tissues: Unremarkable. Limited intracranial: Moderately enlarged ventricles and subarachnoid spaces. Moderate patchy white matter low density in both cerebral hemispheres. IMPRESSION: 1. No acute abnormality.  No orbital abnormality seen. 2. Moderate diffuse cerebral and cerebellar atrophy and moderate chronic small vessel white matter ischemic changes in both cerebral hemispheres. 3. Cervical spine  degenerative changes. Electronically Signed   By: Beckie Salts M.D.   On: 07/15/2018 15:50    Microbiology: Recent Results (from the past 240 hour(s))  Urine culture     Status: None   Collection Time: 07/15/18  1:44 PM  Result Value Ref Range Status   Specimen Description   Final    URINE, CATHETERIZED Performed at [facility], [address] [place], [place] [address]    Special Requests   Final    NONE Performed at [facility], [address] [place], [place] [address]    Culture   Final    NO GROWTH Performed at [facility], [address] [place], [place] [address]    Report Status 07/17/2018 FINAL  Final  Culture, blood (single)     Status: None (Preliminary result)   Collection Time: 07/15/18  3:41 PM  Result Value Ref Range Status   Specimen Description   Final    RIGHT ANTECUBITAL BOTTLES DRAWN AEROBIC AND ANAEROBIC   Special Requests Blood Culture adequate volume  Final   Culture   Final    NO GROWTH 2 DAYS Performed at [facility], [address] [place], [place] [address]    Report Status PENDING  Incomplete  Culture, blood (routine x 2)     Status: None (Preliminary result)   Collection Time: 07/15/18  6:36 PM  Result Value Ref Range Status   Specimen Description BLOOD LEFT HAND  Final   Special Requests   Final    BOTTLES DRAWN AEROBIC ONLY Blood Culture results may not be optimal due to an inadequate volume of blood received in culture bottles   Culture   Final    NO GROWTH 2 DAYS Performed at [facility], [address] [place], [place] [address]    Report Status PENDING  Incomplete     Labs: Basic Metabolic Panel: Recent Labs  Lab 07/14/18 1148 07/15/18 1541 07/17/18 0608  NA 141 138 141  K 4.1 3.7 3.2*  CL 108 108 113*  CO2 19* 20* 18*  GLUCOSE 84 127* 98  BUN 33* 32* 30*  CREATININE 1.43* 1.53* 1.39*  CALCIUM 9.2 8.4* 8.6*   Liver Function Tests: Recent Labs  Lab 07/14/18 1148 07/15/18 1541  AST 12 28  ALT 4* 13   ALKPHOS  --  63  BILITOT 0.3 0.5  PROT 6.4 6.9  ALBUMIN  --  3.2*   CBC: Recent Labs  Lab 07/14/18 1148 07/15/18 1541 07/16/18 0717 07/17/18 0608  WBC 15.8* 14.5* 13.1* 13.8*  NEUTROABS 13,209*  --   --   --   HGB 9.7* 9.4* 7.9* 10.0*  HCT 30.4* 29.8* 24.1* 31.8*  MCV 98.4 99.0 98.0 99.4  PLT 257 233 208 241    Signed:  Vassie Loll MD.  [facility] 07/17/2018, 3:05 PM

## 2018-07-17 NOTE — Care Management (Signed)
Patient Information   Patient Name [name], [name] ([id number]) Sex Female DOB [birth year]  Room Bed  A332 A332-01  Patient Demographics   Address [address] [place] [address] Phone [phone] [facility]) [phone] (Mobile)  Patient Ethnicity & Race   Ethnic Group Patient Race  Not Hispanic or Latino Black or African American  Emergency Contact(s)   Name Relation Home Work Mobile  Perkins,Brenda Daughter [phone]  [phone]  No name specified      Documents on File    Status Date Received Description  Documents for the Patient  EMR Medication Summary Not Received    EMR Problem Summary Not Received    EMR Patient Summary Not Received    Driver's License Not Received    [place] HIPAA NOTICE OF PRIVACY - Scanned Received    [place] E-Signature HIPAA Notice of Privacy Signed 05/11/17   [place] E-Signature HIPAA Notice of Privacy Spanish Signed 04/28/18   Advance Directives/Living Will/HCPOA/POA Not Received    Release of Information Not Received    Historic Radiology Documentation Not Received    Insurance Card Not Received 10/08/13 VERIFIED FOR MEDICARE  Insurance Card Not Received  [facility] CARD  Financial Application Not Received    AMB Correspondence Not Received  06/12 [facility]  Release of Information Not Received    [place] HIPAA NOTICE OF PRIVACY - Scanned Not Received    AMB Correspondence Not Received  08/13 [facility]   Advanced Beneficiary Notice (ABN) Not Received    E-Signature AOB Spanish Not Received    AMB Provider Completed Forms  01/18/14 PRESCRIBER RESPONSE FORM SILVERSCRIPT  AMB Correspondence  06/27/14 [facility] MEDICINE  Other Photo ID Not Received    Insurance Card Received 09/13/16 Medicaid / tgm old 2017  AMB HH/NH/Hospice  05/07/16 MEDICATION ISSUE COMMUNICATION/ORDER [facility]  AMB Provider Completed Forms  05/05/16 CASE COMMUNICATION [facility]  AMB HH/NH/Hospice   05/06/16 HH CERTIFICATION/POC [facility] [place] [facility]  AMB Correspondence Received 06/04/17 ORDER [facility]  AMB Provider Completed Forms Received 05/27/17 [facility] ORDER [facility]  AMB Correspondence Received 11/10/17 SOAP NOTE&EVALUATION TEOH MD , S  Insurance Card Received 05/09/18 Insurance Card/ [employer] /Medicaid 10/31/2017/AP [employer] Card Received 05/16/18 Insurance Card/ New [facility] CARD/AP OUTPT REHAB  Driver's License (Deleted) 09/01/09   AMB Correspondence (Deleted) 02/16/11 06/12 [facility]  AMB Correspondence (Deleted) 01/18/14 PRESCRIBER RESPONSE FORM SILVERSCRIPT  AMB HH/NH/Hospice (Deleted) 05/05/16 CASE COMMUNICATION [facility]  Patient Photo   Photo of Patient  Documents for the Encounter  AOB (Assignment of Insurance Benefits) Not Received    E-signature AOB Signed 07/15/18   MEDICARE RIGHTS Not Received    E-signature Medicare Rights Signed 07/15/18   EMS Run Sheet Received 07/15/18   ED Patient Billing Extract   ED PB Billing Extract  Cardiac Monitoring Strip Shift Summary Received 07/15/18   Cardiac Monitoring Strip Received 07/16/18   After Visit Summary   IP After Visit Summary  EKG Received 07/17/18   Admission Information   Current Information   Attending Provider Admitting Provider Admission Type Admission Status  Vassie Loll, MD Levie Heritage, DO Emergency Admission (Confirmed)       Admission Date/Time Discharge Date Hospital Service Auth/Cert Status  07/15/18 01:20 PM  Family Medicine Incomplete       Hospital Area Unit Room/Bed   [facility] AP-DEPT 300 [phone]  Discharge Disposition Discharge Destination  01-Home or Self Care   Admission   Complaint  emesis,eye swelling  Hospital Account   Name Acct ID Class Status Primary Coverage  [name], [name] [id number] Inpatient Open MEDICARE - MEDICARE PART A AND B      Guarantor Account (for Hospital  Account [id number])   Name Relation to Pt Service Area Active? Acct Type  [name] Self [facility] Yes Personal/Family  Address Phone    [address] [place], [place] [address] [phone](H)        Coverage Information (for Hospital Account [id number])   1. MEDICARE/MEDICARE PART A AND B   F/O Payor/Plan Precert #  MEDICARE/MEDICARE PART A AND B   Subscriber Subscriber #  [name], [name] [id number]  Address Phone  [address] [place], [place] [address]   2. MEDICAID [place]/MEDICAID [place]   F/O Payor/Plan Precert #  MEDICAID [place]/MEDICAID OF    Subscriber Subscriber #  [name], [name] [id number]  Address Phone  [address] [address] [place], [place] [address] [phone]       Care Everywhere ID:  [id number]

## 2018-07-18 ENCOUNTER — Telehealth: Payer: Self-pay | Admitting: Family Medicine

## 2018-07-18 LAB — HIV ANTIBODY (ROUTINE TESTING W REFLEX): HIV SCREEN 4TH GENERATION: NONREACTIVE

## 2018-07-18 NOTE — Telephone Encounter (Signed)
Left Voicemail for pt daughter [name] to return call, checking on her

## 2018-07-20 ENCOUNTER — Other Ambulatory Visit: Payer: Self-pay

## 2018-07-20 ENCOUNTER — Ambulatory Visit (HOSPITAL_COMMUNITY): Payer: Medicare Other | Admitting: Physical Therapy

## 2018-07-20 DIAGNOSIS — R601 Generalized edema: Secondary | ICD-10-CM

## 2018-07-20 DIAGNOSIS — S91002S Unspecified open wound, left ankle, sequela: Secondary | ICD-10-CM | POA: Diagnosis not present

## 2018-07-20 LAB — CULTURE, BLOOD (ROUTINE X 2): CULTURE: NO GROWTH

## 2018-07-20 LAB — CULTURE, BLOOD (SINGLE)
Culture: NO GROWTH
Special Requests: ADEQUATE

## 2018-07-20 NOTE — Therapy (Signed)
[place], [place], [address] Phone: [phone]   Fax:  [phone]  Wound Care Therapy Progress Note Reporting Period 06/01/2018 to 07/20/2018  See note below for Objective Data and Assessment of Progress/Goals.      Patient Details  Name: [name] MRN: [id number] Date of Birth: 1929-02-27 Referring Provider (PT): [facility]   Encounter Date: 07/20/2018  PT End of Session - 07/20/18 1147    Visit Number  20   progress note completed visit #10, #20   Number of Visits  26    Date for PT Re-Evaluation  07/06/18    Authorization Type  medicare    Authorization Time Period  3/5-4/3    Authorization - Visit Number  39    Authorization - Number of Visits  26    PT Start Time  1120    PT Stop Time  1143    PT Time Calculation (min)  23 min    Activity Tolerance  Patient tolerated treatment well    Behavior During Therapy  WFL for tasks assessed/performed       Past Medical History:  Diagnosis Date  . Anemia    Hemoglobin of 11.6 in 01/2010; 12.1 in 08/2010  . Atrial fibrillation ([place])    chronic requiring anticoagulation  . Chronic anticoagulation   . Chronic kidney disease    Stage III; creatinine of 1.7 in 04/2007 and 1.5 2009; 1.7 in 04/2010  . Hyperlipidemia    Total cholesterol of 226, triglycerides of 90, HDL of 49 and LDL of 159 in 01/2010  . Hypertension     Past Surgical History:  Procedure Laterality Date  . APPENDECTOMY    . BUNIONECTOMY  1995  . PARTIAL HYSTERECTOMY      There were no vitals filed for this visit.              Wound Therapy - 07/20/18 1123    Subjective  Pt without any issues.  Dressing intact but slid down some.    Patient and Family Stated Goals  for wound to heal.     Date of Onset  04/22/18    Prior Treatments  self care, antibiotic at ER    Evaluation and Treatment Procedures Explained to Patient/Family  Yes    Evaluation and Treatment  Procedures  agreed to    Wound Properties Date First Assessed: 07/15/18 Time First Assessed: 1440 Wound Type: Other (Comment) Location: Ankle Location Orientation: Left;Lateral Wound Description (Comments): dark red not granulation, hypertrophic  Present on Admission: Yes   Dressing Type  Impregnated gauze (bismuth);Alginate;Compression wrap    Dressing Status  Old drainage    Dressing Change Frequency  PRN    Site / Wound Assessment  Granulation tissue    % Wound base Red or Granulating  100%   hypergranluation present   % Wound base Yellow/Fibrinous Exudate  0%    % Wound base Black/Eschar  0%    Peri-wound Assessment  Intact;Edema    Wound Length (cm)  0.8 cm   was 1.2 cm on 3/12   Wound Width (cm)  0.5 cm   was 0.8 cm on 3/12   Wound Depth (cm)  0 cm    Wound Volume (cm^3)  0 cm^3    Wound Surface Area (cm^2)  0.4 cm^2    Margins  Attached edges (approximated)    Drainage Amount  Minimal    Drainage Description  Sanguineous  Wound Therapy - Clinical Statement  Pt measured this session with additional reduction in size of wound, approximating well.  Anticipate healing in 1-2 more weeks.  Continued with xeroform and profore dressings.  minimal debridement around wound borders needed this session.     Wound Therapy - Functional Problem List  wound bleeds very easily so it is difficult to don socks.     Factors Delaying/Impairing Wound Healing  Altered sensation;Infection - systemic/local;Immobility;Multiple medical problems;Polypharmacy    Hydrotherapy Plan  Debridement;Dressing change;Patient/family education    Wound Therapy - Frequency  Other (comment)   decrease to once a week    Wound Therapy - Current Recommendations  PT    Wound Plan  Continue cleanse, debride,( if needed), and moisture LE with appropriate compression dressings.      Dressing   xero, profore lite                PT Short Term Goals - 06/20/18 1105      PT SHORT TERM GOAL #1   Title  PT daughter  to be able to verbalize the sign and symptoms of cellulitis and the need to get medical attention immediatelyl.     Time  1    Period  Weeks    Status  Achieved      PT SHORT TERM GOAL #2   Title  Wound to decrease in size by 1.5 cm LEngth and width and no longer by hypergranulated     Time  3    Period  Weeks    Status  Achieved        PT Long Term Goals - 06/20/18 1105      PT LONG TERM GOAL #1   Title  PT wound to be 100% granulated with healthy red tissue     Time  6    Period  Weeks    Status  Achieved      PT LONG TERM GOAL #2   Title  PT wound size to be decreased to no greater than 2x2 cm to allow daughter to feel confident at self care of wound      Baseline  wound size is smaller than 2x2 but daughter is fearful of self care due to the length of time it has taken to get the wound healed.     Time  6    Period  Weeks    Status  Partially Met      PT LONG TERM GOAL #3   Title  PT to be able to don a sock without causing wound to bleed     Time  6    Period  Weeks    Status  On-going              Patient will benefit from skilled therapeutic intervention in order to improve the following deficits and impairments:     Visit Diagnosis: Generalized edema  Ankle wound, left, sequela     Problem List Patient Active Problem List   Diagnosis Date Noted  . Cellulitis of right orbital region   . Periorbital cellulitis of left eye 07/15/2018  . CAP (community acquired pneumonia) 07/15/2018  . Impaired gait and mobility 05/24/2017  . Weakness 04/28/2016  . Loss of weight 10/22/2013  . Encounter for therapeutic drug monitoring 06/04/2013  . Dementia ([place]) 05/18/2013  . UTI (urinary tract infection) 10/05/2012  . Chronic insomnia 06/30/2012  . Headache(784.0) 03/03/2012  . Eosinophilia 11/27/2011  . Peripheral vascular disease ([place])  09/29/2011  . Chronic kidney disease   . Tobacco abuse, in remission   . Chronic anticoagulation   . 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 Hyperlipidemia 01/01/2010  . Anemia 04/09/2009  . Hypertension 04/09/2009  . ATRIAL FIBRILLATION, CHRONIC 04/09/2009   [name], PTA/CLT [id number]  [name] 07/20/2018, 11:49 AM  [place], [place], [address] Phone: [phone]   Fax:  [phone]  Name: [name] RUBERG MRN: [id number] Date of Birth: June 13, 1928

## 2018-07-24 ENCOUNTER — Other Ambulatory Visit: Payer: Self-pay | Admitting: *Deleted

## 2018-07-24 NOTE — Patient Outreach (Signed)
[facility]) Care Management  07/24/2018  [name] [birth year] [id number]   EMMI-general discharge  RED ON EMMI ALERT Day # 4  Date: Sunday 07/23/18 1010 Red Alert Reason: Know who to call about changes in condition? No Scheduled follow-up? No  Insurance: medicare  [facility] admissions  X 1 ED visits x 1 in the last 6 months   Outreach attempt # 1 No answer. [facility] RN CM left HIPAA compliant voicemail message along with CM's contact info.   Plan: [facility] RN CM sent an unsuccessful outreach letter and scheduled this patient for another call attempt within 4 business days  [name] L. Noelle Penner, RN, BSN, CCM [facility] Telephonic Care Management Care Coordinator Office number [phone] Mobile number [phone]  Main [facility] number [phone] Fax number [phone]

## 2018-07-25 ENCOUNTER — Telehealth: Payer: Self-pay | Admitting: Pharmacist

## 2018-07-25 ENCOUNTER — Other Ambulatory Visit: Payer: Self-pay | Admitting: *Deleted

## 2018-07-25 NOTE — Telephone Encounter (Signed)

## 2018-07-25 NOTE — Patient Outreach (Signed)
[facility]) Care Management  07/25/2018  [name] [birth year] [id number]   EMMI-general discharge  RED ON EMMI ALERT Day # 4  Date: Sunday 07/23/18 1010 Red Alert Reason: Know who to call about changes in condition? No Scheduled follow-up? No  Insurance: medicare  [facility] admissions  X 1 ED visits x 1 in the last 6 months   Outreach attempt # 2 No answer. [facility] RN CM left HIPAA compliant voicemail message along with CM's contact info.   Plan: [facility] RN CM sent an unsuccessful outreach letter on 07/24/18 [facility] RN CM will scheduled this patient for a third and final call attempt within 4 business days  Kimberly L. Noelle Penner, RN, BSN, CCM [facility] Telephonic Care Management Care Coordinator Office number [phone] Mobile number [phone]  Main [facility] number [phone] Fax number [phone]

## 2018-07-26 ENCOUNTER — Ambulatory Visit (INDEPENDENT_AMBULATORY_CARE_PROVIDER_SITE_OTHER): Payer: Medicare Other | Admitting: *Deleted

## 2018-07-26 DIAGNOSIS — Z5181 Encounter for therapeutic drug level monitoring: Secondary | ICD-10-CM | POA: Diagnosis not present

## 2018-07-26 DIAGNOSIS — I749 Embolism and thrombosis of unspecified artery: Secondary | ICD-10-CM | POA: Diagnosis not present

## 2018-07-26 DIAGNOSIS — I4891 Unspecified atrial fibrillation: Secondary | ICD-10-CM

## 2018-07-26 LAB — POCT INR: INR: 2.3 (ref 2.0–3.0)

## 2018-07-26 NOTE — Patient Instructions (Signed)
Continue 4mg  daily except 2.5mg  on Wednesdays Recheck in 4 weeks

## 2018-07-27 ENCOUNTER — Ambulatory Visit (HOSPITAL_COMMUNITY): Payer: Medicare Other | Admitting: Physical Therapy

## 2018-07-27 ENCOUNTER — Telehealth (HOSPITAL_COMMUNITY): Payer: Self-pay

## 2018-07-27 ENCOUNTER — Other Ambulatory Visit: Payer: Self-pay | Admitting: *Deleted

## 2018-07-27 NOTE — Patient Outreach (Signed)
[facility]) Care Management  07/27/2018  [name] 12/16/1928 [id number]   EMMI-general discharge  RED ON EMMI ALERT Day #4 Date:Sunday 07/23/18 1010 Red Alert Reason:Know who to call about changes in condition? No Scheduled follow-up? No  Insurance:medicare [facility] 1ED visitsx 1in the last 6 months  Outreach attempt # 3 No answer. [facility] RN CM left HIPAA compliant voicemail message along with CM's contact info.   Plan: [facility] RN CM sent an unsuccessful outreach letter on 07/24/18 [facility] RN CM left 3 voice messages [facility] RN CM ill schedule this patient for case closurel within 6 business days if no return call from [name] or her daughter  Routed note to MD  Cala Bradford L. Noelle Penner, RN, BSN, CCM [facility] Telephonic Care Management Care Coordinator Office number [phone] Mobile number [phone]  Main [facility] number [phone] Fax number [phone]

## 2018-07-27 NOTE — Telephone Encounter (Signed)
07/27/2018 Spoke with daugher she can only come in the afternoon - Beth approved to open this time slot  1:45pm for patient. NF

## 2018-07-28 ENCOUNTER — Encounter (HOSPITAL_COMMUNITY): Payer: Self-pay

## 2018-07-28 ENCOUNTER — Other Ambulatory Visit: Payer: Self-pay

## 2018-07-28 ENCOUNTER — Ambulatory Visit (HOSPITAL_COMMUNITY): Payer: Medicare Other

## 2018-07-28 DIAGNOSIS — R601 Generalized edema: Secondary | ICD-10-CM | POA: Diagnosis not present

## 2018-07-28 DIAGNOSIS — S91002S Unspecified open wound, left ankle, sequela: Secondary | ICD-10-CM | POA: Diagnosis not present

## 2018-07-28 NOTE — Therapy (Signed)
[place], [place], [address] Phone: [phone]   Fax:  [phone]  Wound Care Therapy  Patient Details  Name: [name] MRN: [id number] Date of Birth: 03-Feb-1929 Referring Provider (PT): [facility]   Encounter Date: 07/28/2018  PT End of Session - 07/28/18 1454    Visit Number  21   progress note completed visit #10, #20   Number of Visits  26    Date for PT Re-Evaluation  07/06/18    Authorization Type  medicare    Authorization Time Period  3/5-4/3    Authorization - Visit Number  21    Authorization - Number of Visits  26    PT Start Time  1791    PT Stop Time  1425    PT Time Calculation (min)  38 min    Activity Tolerance  Patient tolerated treatment well    Behavior During Therapy  [facility] for tasks assessed/performed       Past Medical History:  Diagnosis Date  . Anemia    Hemoglobin of 11.6 in 01/2010; 12.1 in 08/2010  . Atrial fibrillation ([place])    chronic requiring anticoagulation  . Chronic anticoagulation   . Chronic kidney disease    Stage III; creatinine of 1.7 in 04/2007 and 1.5 2009; 1.7 in 04/2010  . Hyperlipidemia    Total cholesterol of 226, triglycerides of 90, HDL of 49 and LDL of 159 in 01/2010  . Hypertension     Past Surgical History:  Procedure Laterality Date  . APPENDECTOMY    . BUNIONECTOMY  1995  . PARTIAL HYSTERECTOMY      There were no vitals filed for this visit.     Wound Therapy - 07/28/18 1446    Subjective  Patient arrived with her daughter Hassan Rowan. She denies pain. Dressing is intact but has slide down the leg slightly and up her forefoot.     Patient and Family Stated Goals  for wound to heal.     Date of Onset  04/22/18    Prior Treatments  self care, antibiotic at ER    Pain Scale  0-10    Pain Score  0-No pain    Evaluation and Treatment Procedures Explained to Patient/Family  Yes    Evaluation and Treatment Procedures  agreed to    Wound  Properties Date First Assessed: 07/15/18 Time First Assessed: 1440 Wound Type: Other (Comment) Location: Ankle Location Orientation: Left;Lateral Wound Description (Comments): dark red not granulation, hypertrophic  Present on Admission: Yes   Dressing Type  Impregnated gauze (bismuth);Alginate;Compression wrap    Dressing Changed  Changed    Dressing Status  Old drainage    Dressing Change Frequency  PRN    Site / Wound Assessment  Granulation tissue    % Wound base Red or Granulating  100%   hypergranluation present   % Wound base Yellow/Fibrinous Exudate  0%    % Wound base Black/Eschar  0%    Peri-wound Assessment  Intact;Edema    Wound Length (cm)  0.6 cm    Wound Width (cm)  0.4 cm    Wound Depth (cm)  0 cm    Wound Volume (cm^3)  0 cm^3    Wound Surface Area (cm^2)  0.24 cm^2    Margins  Attached edges (approximated)    Drainage Amount  Scant    Drainage Description  Sanguineous    Treatment  Cleansed    Wound Therapy -  Clinical Statement  Patient's wound continues to reduce in size and is approximating well. Minimal drainage present and xeroform applied to wound bed to maintain healthy wound bed environment. Patient measured for compression stockings this session and provided handout to patient's daughter on [employer] company to obtain new stocking from. Anticipate wound will be healed next session and patient will likely be ready for discharge.     Wound Therapy - Functional Problem List  wound bleeds very easily so it is difficult to don socks.     Factors Delaying/Impairing Wound Healing  Altered sensation;Infection - systemic/local;Immobility;Multiple medical problems;Polypharmacy    Hydrotherapy Plan  Debridement;Dressing change;Patient/family education    Wound Therapy - Frequency  Other (comment)   decrease to once a week    Wound Therapy - Current Recommendations  PT    Wound Plan  Continue cleanse, debride,( if needed), and moisture LE with appropriate compression  dressings.      Dressing   xeroform, profore lite         PT Short Term Goals - 06/20/18 1105      PT SHORT TERM GOAL #1   Title  PT daughter to be able to verbalize the sign and symptoms of cellulitis and the need to get medical attention immediatelyl.     Time  1    Period  Weeks    Status  Achieved      PT SHORT TERM GOAL #2   Title  Wound to decrease in size by 1.5 cm LEngth and width and no longer by hypergranulated     Time  3    Period  Weeks    Status  Achieved        PT Long Term Goals - 06/20/18 1105      PT LONG TERM GOAL #1   Title  PT wound to be 100% granulated with healthy red tissue     Time  6    Period  Weeks    Status  Achieved      PT LONG TERM GOAL #2   Title  PT wound size to be decreased to no greater than 2x2 cm to allow daughter to feel confident at self care of wound      Baseline  wound size is smaller than 2x2 but daughter is fearful of self care due to the length of time it has taken to get the wound healed.     Time  6    Period  Weeks    Status  Partially Met      PT LONG TERM GOAL #3   Title  PT to be able to don a sock without causing wound to bleed     Time  6    Period  Weeks    Status  On-going        Plan - 07/28/18 1454    Clinical Impression Statement  see above    Rehab Potential  Good    PT Frequency  1x / week    PT Duration  6 weeks    PT Treatment/Interventions  ADLs/Self Care Home Management;Manual techniques   debridement and compression dressing    PT Next Visit Plan  decrease to one time a week at this time.     Consulted and Agree with Plan of Care  Patient;Family member/caregiver       Patient will benefit from skilled therapeutic intervention in order to improve the following deficits and impairments:  Decreased skin integrity, Increased  edema, Pain  Visit Diagnosis: Generalized edema  Ankle wound, left, sequela     Problem List Patient Active Problem List   Diagnosis Date Noted  . Cellulitis  of right orbital region   . Periorbital cellulitis of left eye 07/15/2018  . CAP (community acquired pneumonia) 07/15/2018  . Impaired gait and mobility 05/24/2017  . Weakness 04/28/2016  . Loss of weight 10/22/2013  . Encounter for therapeutic drug monitoring 06/04/2013  . Dementia ([place]) 05/18/2013  . UTI (urinary tract infection) 10/05/2012  . Chronic insomnia 06/30/2012  . Headache(784.0) 03/03/2012  . Eosinophilia 11/27/2011  . Peripheral vascular disease ([place]) 09/29/2011  . Chronic kidney disease   . Tobacco abuse, in remission   . Chronic anticoagulation   . Arterial occlusion due to thromboembolism ([place]) 04/28/2010  . Hyperlipidemia 01/01/2010  . Anemia 04/09/2009  . Hypertension 04/09/2009  . ATRIAL FIBRILLATION, CHRONIC 04/09/2009    Kipp Brood, PT, DPT, [facility] Physical Therapist with [facility]  07/28/2018 2:56 PM    [place], [place], [address] Phone: [phone]   Fax:  [phone]  Name: MIAA LATTERELL MRN: [id number] Date of Birth: 14-Dec-1928

## 2018-07-31 ENCOUNTER — Telehealth: Payer: Self-pay | Admitting: *Deleted

## 2018-07-31 MED ORDER — FAMOTIDINE 40 MG PO TABS: 20.0000 mg | ORAL_TABLET | Freq: Two times a day (BID) | ORAL | 1 refills | Status: DC

## 2018-07-31 NOTE — Telephone Encounter (Signed)
Unless they can break in half, she needs the BID dosing of 20mg  If they can cut the 40mg  in half that is okay to change  If not keep the 20mg  BID

## 2018-07-31 NOTE — Telephone Encounter (Signed)
Received fax requesting alternative to Pepcid 20mg . Reports that strength is oly available as OTC. Requested to increase medication to 40mg .   MD please advise.

## 2018-07-31 NOTE — Telephone Encounter (Signed)
Call placed to pharmacy. Advised that medication is scored and can be broken in half.   Prescription sent to pharmacy for Pepcid 40mg  0.5 tab PO BID.

## 2018-08-03 ENCOUNTER — Ambulatory Visit (HOSPITAL_COMMUNITY): Payer: Medicare Other | Admitting: Physical Therapy

## 2018-08-04 ENCOUNTER — Encounter (HOSPITAL_COMMUNITY): Payer: Self-pay

## 2018-08-04 ENCOUNTER — Ambulatory Visit (HOSPITAL_COMMUNITY): Payer: Medicare Other | Attending: Family Medicine

## 2018-08-04 ENCOUNTER — Other Ambulatory Visit: Payer: Self-pay

## 2018-08-04 DIAGNOSIS — S91002S Unspecified open wound, left ankle, sequela: Secondary | ICD-10-CM | POA: Insufficient documentation

## 2018-08-04 DIAGNOSIS — R601 Generalized edema: Secondary | ICD-10-CM | POA: Diagnosis not present

## 2018-08-04 NOTE — Therapy (Signed)
[facility], [place], [address] Phone: [phone]   Fax:  [phone]  Wound Care Therapy Discharge  Patient Details  Name: [name] MRN: [id number] Date of Birth: [birth year] Referring Provider (PT): [place]   Encounter Date: 08/04/2018  PHYSICAL THERAPY DISCHARGE SUMMARY  Visits from Start of Care: 22  Current functional level related to goals / functional outcomes: Patient's wound has fully approximated and healed this session. Educated patient and her daughter on how to obtain compression stockings and to continue to call [employer] for the stockings. Suggested that patient's daughter check at local pharmacies for over the counter stocking ideally from 20-30 mmHg compression. Moisturized patient's LE and provide with foot cover and patient did not have a shoe to don. She will be discharged as wounds have healed.    Remaining deficits: See below details.   Education / Equipment: Educated on readiness for discharge and on measurements for compression stocking and how to order/obtain stockings. Educated to wear compression sock/stocking during day and remove at night.   Plan: Patient agrees to discharge.  Patient goals were met. Patient is being discharged due to meeting the stated rehab goals.  ?????      PT End of Session - 08/04/18 1216    Visit Number  22   progress note completed visit #10, #20   Number of Visits  26    Date for PT Re-Evaluation  07/06/18    Authorization Type  medicare    Authorization Time Period  3/5-4/3    Authorization - Visit Number  81    Authorization - Number of Visits  26    PT Start Time  2725    PT Stop Time  1145    PT Time Calculation (min)  22 min    Activity Tolerance  Patient tolerated treatment well    Behavior During Therapy  WFL for tasks assessed/performed       Past Medical History:  Diagnosis Date  . Anemia    Hemoglobin of 11.6 in 01/2010;  12.1 in 08/2010  . Atrial fibrillation ([place])    chronic requiring anticoagulation  . Chronic anticoagulation   . Chronic kidney disease    Stage III; creatinine of 1.7 in 04/2007 and 1.5 2009; 1.7 in 04/2010  . Hyperlipidemia    Total cholesterol of 226, triglycerides of 90, HDL of 49 and LDL of 159 in 01/2010  . Hypertension     Past Surgical History:  Procedure Laterality Date  . APPENDECTOMY    . BUNIONECTOMY  1995  . PARTIAL HYSTERECTOMY      There were no vitals filed for this visit.     Wound Therapy - 08/04/18 1207    Subjective  Patient arrived with daughter and dressing intact.     Patient and Family Stated Goals  for wound to heal.     Date of Onset  04/22/18    Prior Treatments  self care, antibiotic at ER    Pain Scale  0-10    Pain Score  0-No pain    Evaluation and Treatment Procedures Explained to Patient/Family  Yes    Evaluation and Treatment Procedures  agreed to    Wound Properties Date First Assessed: 07/15/18 Time First Assessed: 1440 Wound Type: Other (Comment) Location: Ankle Location Orientation: Left;Lateral Wound Description (Comments): dark red not granulation, hypertrophic  Present on Admission: Yes Final Assessment Date: 08/04/18 Final Assessment Time: 1130   Dressing Type  Impregnated gauze (bismuth);Alginate;Compression wrap    Dressing Changed  Other (Comment)   removed   Dressing Status  Old drainage    Dressing Change Frequency  PRN    Site / Wound Assessment  Granulation tissue    % Wound base Red or Granulating  100%   approximated skin   % Wound base Yellow/Fibrinous Exudate  0%    % Wound base Black/Eschar  0%    Peri-wound Assessment  Intact;Edema    Wound Length (cm)  0 cm   0.6   Wound Width (cm)  0 cm   0.4   Wound Depth (cm)  0 cm   0   Wound Volume (cm^3)  0 cm^3    Wound Surface Area (cm^2)  0 cm^2    Margins  Attached edges (approximated)    Closure  Approximated    Drainage Amount  None    Drainage Description   Sanguineous    Treatment  Cleansed    Wound Therapy - Clinical Statement  Patient's wound has fully approximated and healed this session. Educated patient and her daughter on how to obtain compression stockings and to continue to call [employer] for the stockings. Suggested that patient's daughter check at local pharmacies for over the counter stocking ideally from 20-30 mmHg compression. Moisturized patient's LE and provide with foot cover and patient did not have a shoe to don. She will be discharged as wounds have healed.     Wound Therapy - Functional Problem List  wound bleeds very easily so it is difficult to don socks.     Factors Delaying/Impairing Wound Healing  Altered sensation;Infection - systemic/local;Immobility;Multiple medical problems;Polypharmacy    Hydrotherapy Plan  Debridement;Dressing change;Patient/family education    Wound Therapy - Frequency  Other (comment)   decrease to once a week    Wound Therapy - Current Recommendations  PT    Wound Plan  Discharging with instructions to obtain compression stockings    Dressing   --         PT Short Term Goals - 08/04/18 1218      PT SHORT TERM GOAL #1   Title  PT daughter to be able to verbalize the sign and symptoms of cellulitis and the need to get medical attention immediatelyl.     Time  1    Period  Weeks    Status  Achieved      PT SHORT TERM GOAL #2   Title  Wound to decrease in size by 1.5 cm LEngth and width and no longer by hypergranulated     Time  3    Period  Weeks    Status  Achieved        PT Long Term Goals - 08/04/18 1218      PT LONG TERM GOAL #1   Title  PT wound to be 100% granulated with healthy red tissue     Time  6    Period  Weeks    Status  Achieved      PT LONG TERM GOAL #2   Title  PT wound size to be decreased to no greater than 2x2 cm to allow daughter to feel confident at self care of wound      Baseline  wound size is smaller than 2x2 but daughter is fearful of self care  due to the length of time it has taken to get the wound healed.     Time  6    Period  Weeks    Status  Achieved      PT LONG TERM GOAL #3   Title  PT to be able to don a sock without causing wound to bleed     Baseline  pt's daughter assists her    Time  6    Period  Weeks    Status  Achieved        Plan - 08/04/18 1217    Clinical Impression Statement  see above    Rehab Potential  Good    PT Frequency  1x / week    PT Duration  6 weeks    PT Treatment/Interventions  ADLs/Self Care Home Management;Manual techniques   debridement and compression dressing    PT Next Visit Plan  discharge    Consulted and Agree with Plan of Care  Patient;Family member/caregiver       Patient will benefit from skilled therapeutic intervention in order to improve the following deficits and impairments:  Decreased skin integrity, Increased edema, Pain  Visit Diagnosis: Generalized edema  Ankle wound, left, sequela     Problem List Patient Active Problem List   Diagnosis Date Noted  . Cellulitis of right orbital region   . Periorbital cellulitis of left eye 07/15/2018  . CAP (community acquired pneumonia) 07/15/2018  . Impaired gait and mobility 05/24/2017  . Weakness 04/28/2016  . Loss of weight 10/22/2013  . Encounter for therapeutic drug monitoring 06/04/2013  . Dementia ([place]) 05/18/2013  . UTI (urinary tract infection) 10/05/2012  . Chronic insomnia 06/30/2012  . Headache(784.0) 03/03/2012  . Eosinophilia 11/27/2011  . Peripheral vascular disease ([place]) 09/29/2011  . Chronic kidney disease   . Tobacco abuse, in remission   . Chronic anticoagulation   . 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 Hyperlipidemia 01/01/2010  . Anemia 04/09/2009  . Hypertension 04/09/2009  . ATRIAL FIBRILLATION, CHRONIC 04/09/2009    Kipp Brood, PT, DPT, [facility] Physical Therapist with [facility]  08/04/2018 12:21 PM    [facility] [address] [place], [place], [address] Phone: [phone]   Fax:  [phone]  Name: [name] MRN: [id number] Date of Birth: November 07, 1928

## 2018-08-10 ENCOUNTER — Other Ambulatory Visit: Payer: Self-pay | Admitting: *Deleted

## 2018-08-10 ENCOUNTER — Ambulatory Visit (HOSPITAL_COMMUNITY): Payer: Medicare Other | Admitting: Physical Therapy

## 2018-08-10 NOTE — Patient Outreach (Addendum)
  [facility]) Care Management  08/10/2018  [name] Sep 06, 1928 [id number]   Case closure   Call attempts made on 07/24/18, 07/25/18 and 07/27/18 Unsuccessful outreach letter sent on 07/24/18 without a response   Plan [facility] RN CM will close case after no response from patient within 10 business days. Unable to reach case clousre letter sent to Dr Marcia Brash L. Noelle Penner, RN, BSN, CCM [facility] Telephonic Care Management Care Coordinator Office number [phone] Mobile number [phone]  Main [facility] number [phone] Fax number [phone]

## 2018-08-11 ENCOUNTER — Ambulatory Visit (HOSPITAL_COMMUNITY): Payer: Medicare Other

## 2018-08-17 ENCOUNTER — Ambulatory Visit (HOSPITAL_COMMUNITY): Payer: Medicare Other | Admitting: Physical Therapy

## 2018-08-18 ENCOUNTER — Ambulatory Visit (HOSPITAL_COMMUNITY): Payer: Medicare Other

## 2018-08-22 ENCOUNTER — Telehealth: Payer: Self-pay | Admitting: *Deleted

## 2018-08-22 NOTE — Telephone Encounter (Signed)

## 2018-08-23 ENCOUNTER — Ambulatory Visit (INDEPENDENT_AMBULATORY_CARE_PROVIDER_SITE_OTHER): Payer: Medicare Other | Admitting: *Deleted

## 2018-08-23 DIAGNOSIS — Z5181 Encounter for therapeutic drug level monitoring: Secondary | ICD-10-CM

## 2018-08-23 DIAGNOSIS — I749 Embolism and thrombosis of unspecified artery: Secondary | ICD-10-CM

## 2018-08-23 DIAGNOSIS — I4891 Unspecified atrial fibrillation: Secondary | ICD-10-CM

## 2018-08-23 LAB — POCT INR: INR: 2.5 (ref 2.0–3.0)

## 2018-08-23 MED ORDER — WARFARIN SODIUM 2.5 MG PO TABS: ORAL_TABLET | ORAL | 1 refills | Status: DC

## 2018-08-23 NOTE — Patient Instructions (Signed)
Continue 4mg daily except 2.5mg on Wednesdays Recheck in 4 weeks 

## 2018-09-12 ENCOUNTER — Telehealth: Payer: Self-pay | Admitting: *Deleted

## 2018-09-12 NOTE — Telephone Encounter (Signed)
Received fax requesting alternative to Famotidine as medication is on backorder.   MD please advise.

## 2018-09-12 NOTE — Telephone Encounter (Signed)
For now, will have to use Omeprazole and see how she does Okay to give a tums if needed for heartburn

## 2018-09-13 MED ORDER — OMEPRAZOLE 40 MG PO CPDR: 40.0000 mg | DELAYED_RELEASE_CAPSULE | Freq: Every day | ORAL | 3 refills | Status: DC

## 2018-09-13 NOTE — Telephone Encounter (Signed)
Call placed to patient and [name] made aware.   Prescription sent to pharmacy.

## 2018-09-19 ENCOUNTER — Telehealth: Payer: Self-pay | Admitting: *Deleted

## 2018-09-19 DIAGNOSIS — L309 Dermatitis, unspecified: Secondary | ICD-10-CM

## 2018-09-19 DIAGNOSIS — L299 Pruritus, unspecified: Secondary | ICD-10-CM

## 2018-09-19 NOTE — Telephone Encounter (Signed)
Received call from patient daughter Trena Platt, [phone]~ telephone.   Reports that patient continues to scratch at self and medication is not helping. Requested referral to dermatology.   MD please advise.

## 2018-09-19 NOTE — Telephone Encounter (Signed)
Call placed to patient and [name] made aware.   Referral orders placed.

## 2018-09-19 NOTE — Telephone Encounter (Signed)
Okay to send to dermatology Can give benadryl in place of atarax if needed/ also use SARNA lotion OTC Dx- chronic pruritis/dermatitis skin

## 2018-09-19 NOTE — Telephone Encounter (Signed)
° ° °  COVID-19 Pre-Screening Questions: ° °• In the past 7 to 10 days have you had a cough,  shortness of breath, headache, congestion, fever, body aches, chills, sore throat, or sudden loss of taste or sense of smell?  No °•  °• Have you been around anyone with known Covid 19. No °•  °• Have you been around anyone who is awaiting Covid 19 test results in the past 7 to 10 days?  No °•  °• Have you been around anyone who has been exposed to Covid 19, or has mentioned symptoms of Covid 19 within the past 7 to 10 days?  no ° °If you have any concerns/questions about symptoms patients report during screening (either on the phone or at threshold). Contact the provider seeing the patient or DOD for further guidance.  If neither are available contact a member of the leadership team. ° ° ° °   ° ° ° ° °

## 2018-09-20 ENCOUNTER — Other Ambulatory Visit: Payer: Self-pay

## 2018-09-20 ENCOUNTER — Ambulatory Visit (INDEPENDENT_AMBULATORY_CARE_PROVIDER_SITE_OTHER): Payer: Medicare Other | Admitting: *Deleted

## 2018-09-20 DIAGNOSIS — I749 Embolism and thrombosis of unspecified artery: Secondary | ICD-10-CM | POA: Diagnosis not present

## 2018-09-20 DIAGNOSIS — I4891 Unspecified atrial fibrillation: Secondary | ICD-10-CM

## 2018-09-20 DIAGNOSIS — Z5181 Encounter for therapeutic drug level monitoring: Secondary | ICD-10-CM

## 2018-09-20 LAB — POCT INR: INR: 2.1 (ref 2.0–3.0)

## 2018-09-20 NOTE — Patient Instructions (Signed)
Continue 4mg  daily except 2.5mg  on Wednesdays Recheck in 4 weeks

## 2018-10-02 ENCOUNTER — Other Ambulatory Visit: Payer: Self-pay | Admitting: Cardiovascular Disease

## 2018-10-05 DIAGNOSIS — L308 Other specified dermatitis: Secondary | ICD-10-CM | POA: Diagnosis not present

## 2018-10-05 DIAGNOSIS — B86 Scabies: Secondary | ICD-10-CM | POA: Diagnosis not present

## 2018-10-17 ENCOUNTER — Telehealth: Payer: Self-pay | Admitting: *Deleted

## 2018-10-17 NOTE — Telephone Encounter (Signed)

## 2018-10-18 ENCOUNTER — Other Ambulatory Visit: Payer: Self-pay | Admitting: Family Medicine

## 2018-10-18 ENCOUNTER — Ambulatory Visit (INDEPENDENT_AMBULATORY_CARE_PROVIDER_SITE_OTHER): Payer: Medicare Other | Admitting: *Deleted

## 2018-10-18 DIAGNOSIS — I4891 Unspecified atrial fibrillation: Secondary | ICD-10-CM

## 2018-10-18 DIAGNOSIS — Z5181 Encounter for therapeutic drug level monitoring: Secondary | ICD-10-CM

## 2018-10-18 DIAGNOSIS — I749 Embolism and thrombosis of unspecified artery: Secondary | ICD-10-CM | POA: Diagnosis not present

## 2018-10-18 DIAGNOSIS — Z760 Encounter for issue of repeat prescription: Secondary | ICD-10-CM

## 2018-10-18 LAB — POCT INR: INR: 2.6 (ref 2.0–3.0)

## 2018-10-18 NOTE — Patient Instructions (Signed)
Continue 4mg  daily except 2.5mg  on Wednesdays Recheck in 6 weeks

## 2018-10-19 DIAGNOSIS — B86 Scabies: Secondary | ICD-10-CM | POA: Diagnosis not present

## 2018-10-19 DIAGNOSIS — L308 Other specified dermatitis: Secondary | ICD-10-CM | POA: Diagnosis not present

## 2018-11-01 ENCOUNTER — Ambulatory Visit: Payer: Medicare Other | Admitting: Cardiovascular Disease

## 2018-11-13 ENCOUNTER — Telehealth: Payer: Self-pay | Admitting: *Deleted

## 2018-11-13 NOTE — Telephone Encounter (Signed)
Received call from patient daughter, Hassan Rowan.  Requested letter for her lawyer to assist in getting POA d/t patient's dem3entia.   MD to be made aware.

## 2018-11-14 NOTE — Telephone Encounter (Signed)
Call placed to patient and patient daughter Hassan Rowan made aware.   Will pick up at front desk on 11/15/2018.

## 2018-11-14 NOTE — Telephone Encounter (Signed)
Letter written

## 2018-12-06 ENCOUNTER — Telehealth: Payer: Self-pay | Admitting: *Deleted

## 2018-12-06 NOTE — Telephone Encounter (Signed)
Received call from [facility] with [facility]. [phone]~ telephone/ [phone]~ fax.   Reports that patient has been accepted to home care services. States that she requires medication list to complete start of care. Medication list faxed to [facility] provider.

## 2018-12-06 NOTE — Telephone Encounter (Signed)
Please call pt's daughter Hassan Rowan concerning missed coumadin apt.

## 2018-12-06 NOTE — Telephone Encounter (Signed)
Appt made for 12/11/18 at 11:30am.  Daughter Hassan Rowan told and in agreement.

## 2018-12-13 ENCOUNTER — Ambulatory Visit (INDEPENDENT_AMBULATORY_CARE_PROVIDER_SITE_OTHER): Payer: Medicare Other | Admitting: *Deleted

## 2018-12-13 ENCOUNTER — Ambulatory Visit: Payer: Medicare Other | Admitting: Cardiovascular Disease

## 2018-12-13 DIAGNOSIS — I749 Embolism and thrombosis of unspecified artery: Secondary | ICD-10-CM

## 2018-12-13 DIAGNOSIS — I4891 Unspecified atrial fibrillation: Secondary | ICD-10-CM | POA: Diagnosis not present

## 2018-12-13 DIAGNOSIS — Z5181 Encounter for therapeutic drug level monitoring: Secondary | ICD-10-CM

## 2018-12-13 LAB — POCT INR: INR: 3.3 — AB (ref 2.0–3.0)

## 2018-12-13 NOTE — Patient Instructions (Signed)
Hold coumadin tonight then resume 4mg  daily except 2.5mg  on Wednesdays Recheck in 6 weeks

## 2018-12-20 ENCOUNTER — Other Ambulatory Visit: Payer: Self-pay | Admitting: Family Medicine

## 2019-01-05 DIAGNOSIS — H838X3 Other specified diseases of inner ear, bilateral: Secondary | ICD-10-CM | POA: Diagnosis not present

## 2019-01-05 DIAGNOSIS — H6123 Impacted cerumen, bilateral: Secondary | ICD-10-CM | POA: Diagnosis not present

## 2019-01-05 DIAGNOSIS — H903 Sensorineural hearing loss, bilateral: Secondary | ICD-10-CM | POA: Diagnosis not present

## 2019-01-06 ENCOUNTER — Other Ambulatory Visit: Payer: Self-pay | Admitting: Family Medicine

## 2019-01-06 DIAGNOSIS — Z760 Encounter for issue of repeat prescription: Secondary | ICD-10-CM

## 2019-01-24 ENCOUNTER — Ambulatory Visit (INDEPENDENT_AMBULATORY_CARE_PROVIDER_SITE_OTHER): Payer: Medicare Other | Admitting: *Deleted

## 2019-01-24 DIAGNOSIS — Z5181 Encounter for therapeutic drug level monitoring: Secondary | ICD-10-CM

## 2019-01-24 DIAGNOSIS — I4891 Unspecified atrial fibrillation: Secondary | ICD-10-CM | POA: Diagnosis not present

## 2019-01-24 DIAGNOSIS — I749 Embolism and thrombosis of unspecified artery: Secondary | ICD-10-CM | POA: Diagnosis not present

## 2019-01-24 LAB — POCT INR: INR: 4.4 — AB (ref 2.0–3.0)

## 2019-01-24 NOTE — Patient Instructions (Signed)
Hold coumadin tonight then decrease dose to 4mg  daily except 2.5mg  on Mondays, Wednesdays and Fridays Recheck in 3 weeks

## 2019-02-14 ENCOUNTER — Other Ambulatory Visit: Payer: Self-pay

## 2019-02-14 ENCOUNTER — Ambulatory Visit (INDEPENDENT_AMBULATORY_CARE_PROVIDER_SITE_OTHER): Payer: Medicare Other | Admitting: *Deleted

## 2019-02-14 DIAGNOSIS — I749 Embolism and thrombosis of unspecified artery: Secondary | ICD-10-CM

## 2019-02-14 DIAGNOSIS — Z5181 Encounter for therapeutic drug level monitoring: Secondary | ICD-10-CM | POA: Diagnosis not present

## 2019-02-14 DIAGNOSIS — I4891 Unspecified atrial fibrillation: Secondary | ICD-10-CM

## 2019-02-14 LAB — POCT INR: INR: 2.6 (ref 2.0–3.0)

## 2019-02-14 NOTE — Patient Instructions (Signed)
Continue warfarin 4mg  daily except 2.5mg  on Mondays, Wednesdays and Fridays Recheck in 5 weeks

## 2019-02-19 ENCOUNTER — Other Ambulatory Visit: Payer: Self-pay | Admitting: Family Medicine

## 2019-02-24 ENCOUNTER — Other Ambulatory Visit: Payer: Self-pay | Admitting: Family Medicine

## 2019-02-24 DIAGNOSIS — F0281 Dementia in other diseases classified elsewhere with behavioral disturbance: Secondary | ICD-10-CM

## 2019-02-24 DIAGNOSIS — G301 Alzheimer's disease with late onset: Secondary | ICD-10-CM

## 2019-02-26 NOTE — Telephone Encounter (Signed)
Ok to refill??  Last office visit 07/14/2018.  Last refill 04/05/2018, #5 refills.

## 2019-03-03 ENCOUNTER — Other Ambulatory Visit: Payer: Self-pay | Admitting: Cardiovascular Disease

## 2019-03-16 ENCOUNTER — Other Ambulatory Visit: Payer: Self-pay

## 2019-03-16 ENCOUNTER — Encounter: Payer: Self-pay | Admitting: Cardiovascular Disease

## 2019-03-16 ENCOUNTER — Ambulatory Visit (INDEPENDENT_AMBULATORY_CARE_PROVIDER_SITE_OTHER): Payer: Medicare Other | Admitting: Cardiovascular Disease

## 2019-03-16 VITALS — BP 210/90 | HR 84 | Ht 60.0 in

## 2019-03-16 DIAGNOSIS — I4821 Permanent atrial fibrillation: Secondary | ICD-10-CM

## 2019-03-16 DIAGNOSIS — I749 Embolism and thrombosis of unspecified artery: Secondary | ICD-10-CM

## 2019-03-16 DIAGNOSIS — E785 Hyperlipidemia, unspecified: Secondary | ICD-10-CM | POA: Diagnosis not present

## 2019-03-16 DIAGNOSIS — I10 Essential (primary) hypertension: Secondary | ICD-10-CM

## 2019-03-16 NOTE — Patient Instructions (Signed)
Your physician wants you to follow-up in: [place] will receive a reminder letter in the mail two months in advance. If you don't receive a letter, please call our office to schedule the follow-up appointment.  Your physician has recommended you make the following change in your medication:   STOP ASPIRIN   Thank you for choosing [facility]!!

## 2019-03-16 NOTE — Progress Notes (Signed)
SUBJECTIVE: The patient presents for long overdue follow-up.  I last saw her on 03/23/2016.  She has a history of atrial fibrillation and peripheral arterial disease.  I personally reviewed the ECG performed on 07/15/2018 which demonstrated atrial fibrillation, 100 bpm, with nonspecific ST segment abnormalities.  She is here with her daughter.  The patient has dementia.  Her daughter routinely checks her mother's blood pressure and it is normal.  The patient is without any specific complaints.      Review of Systems: As per "subjective", otherwise negative.  No Known Allergies  Current Outpatient Medications  Medication Sig Dispense Refill  . amLODipine (NORVASC) 10 MG tablet Take 1 tablet (10 mg total) by mouth daily. 30 tablet 2  . aspirin 81 MG chewable tablet Chew 1 tablet (81 mg total) by mouth daily. 30 tablet 1  . donepezil (ARICEPT) 10 MG tablet TAKE ONE TABLET BY MOUTH ONCE DAILY WITH BREAKFAST 90 tablet 0  . famotidine (PEPCID) 40 MG tablet TAKE 1/2 (ONE-HALF) TABLET BY MOUTH TWICE DAILY FOR ITCHING AND  REFLUX 30 tablet 0  . hydrOXYzine (ATARAX/VISTARIL) 10 MG tablet Take 1 tablet (10 mg total) by mouth 3 (three) times daily as needed for itching.    Marland Kitchen LORazepam (ATIVAN) 1 MG tablet TAKE 1 TABLET BY MOUTH AT BEDTIME AS NEEDED FOR ANXIETY AND AGITATION 30 tablet 4  . metoprolol tartrate (LOPRESSOR) 25 MG tablet Take 1 tablet by mouth twice daily 180 tablet 0  . omeprazole (PRILOSEC) 40 MG capsule Take 1 capsule (40 mg total) by mouth daily. 30 capsule 3  . pravastatin (PRAVACHOL) 10 MG tablet TAKE 1 TABLET BY MOUTH ONCE DAILY (Patient taking differently: Take 10 mg by mouth daily. ) 90 tablet 3  . QUEtiapine (SEROQUEL) 50 MG tablet Take 1 tablet (50 mg total) by mouth daily with breakfast. 90 tablet 0  . warfarin (COUMADIN) 2.5 MG tablet Take 1 tablet (2.5mg ) on Wednesdays only.  Takes 4mg  all other days 30 tablet 1  . warfarin (COUMADIN) 4 MG tablet TAKE 1 TABLET (4  MG) BY MOUTH ONCE DAILY EXCEPT  2.5  MG  ON  WEDNESDAY  AND  SUNDAYS 60 tablet 3   No current facility-administered medications for this visit.     Past Medical History:  Diagnosis Date  . Anemia    Hemoglobin of 11.6 in 01/2010; 12.1 in 08/2010  . Atrial fibrillation ([place])    chronic requiring anticoagulation  . Chronic anticoagulation   . Chronic kidney disease    Stage III; creatinine of 1.7 in 04/2007 and 1.5 2009; 1.7 in 04/2010  . Hyperlipidemia    Total cholesterol of 226, triglycerides of 90, HDL of 49 and LDL of 159 in 01/2010  . Hypertension     Past Surgical History:  Procedure Laterality Date  . APPENDECTOMY    . BUNIONECTOMY  1995  . PARTIAL HYSTERECTOMY      Social History   Socioeconomic History  . Marital status: Single    Spouse name: Not on file  . Number of children: Not on file  . Years of education: Not on file  . Highest education level: Not on file  Occupational History  . Occupation: Retired    Comment: CNA at a local [employer]  . Financial resource strain: Not on file  . Food insecurity    Worry: Not on file    Inability: Not on file  . Transportation needs    Medical:  Not on file    Non-medical: Not on file  Tobacco Use  . Smoking status: Never Smoker  . Smokeless tobacco: Never Used  Substance and Sexual Activity  . Alcohol use: No    Alcohol/week: 0.0 standard drinks  . Drug use: No  . Sexual activity: Never  Lifestyle  . Physical activity    Days per week: Not on file    Minutes per session: Not on file  . Stress: Not on file  Relationships  . Social Musician on phone: Not on file    Gets together: Not on file    Attends religious service: Not on file    Active member of club or organization: Not on file    Attends meetings of clubs or organizations: Not on file    Relationship status: Not on file  . Intimate partner violence    Fear of current or ex partner: Not on file    Emotionally abused: Not on  file    Physically abused: Not on file    Forced sexual activity: Not on file  Other Topics Concern  . Not on file  Social History Narrative  . Not on file     Vitals:   03/16/19 1103 03/16/19 1111  BP: (!) 228/95 (!) 210/90  Pulse: 84   SpO2: 98%   Height: 5' (1.524 m)     Wt Readings from Last 3 Encounters:  07/15/18 150 lb (68 kg)  04/25/18 140 lb (63.5 kg)  03/23/16 152 lb (68.9 kg)     PHYSICAL EXAM General: NAD, elderly, frail-appearing HEENT: Normal. Neck: No JVD, no thyromegaly. Lungs: Clear to auscultation bilaterally with normal respiratory effort. CV: Regular rate and irregular rhythm, normal S1/S2, no S3, no murmur.  Trace bilateral periankle edema.   Abdomen: Soft, nontender, no distention.  Neurologic: Alert.  Psych: Normal affect. Skin: Normal. Musculoskeletal: No gross deformities.      Labs: Lab Results  Component Value Date/Time   K 3.2 (L) 07/17/2018 06:08 AM   BUN 30 (H) 07/17/2018 06:08 AM   CREATININE 1.39 (H) 07/17/2018 06:08 AM   CREATININE 1.43 (H) 07/14/2018 11:48 AM   ALT 13 07/15/2018 03:41 PM   TSH 1.92 09/05/2015 11:48 AM   HGB 10.0 (L) 07/17/2018 06:08 AM     Lipids: Lab Results  Component Value Date/Time   LDLCALC 101 (H) 09/17/2016 11:56 AM   CHOL 173 09/17/2016 11:56 AM   TRIG 101 09/17/2016 11:56 AM   HDL 52 09/17/2016 11:56 AM       ASSESSMENT AND PLAN: 1.  Permanent atrial fibrillation: Heart rate controlled on metoprolol 25 mg twice daily.  Anticoagulated with warfarin.  I will stop aspirin.  2.  Hypertension: Currently on amlodipine 10 mg daily.  Her daughter recently checks her mother's blood pressure and it is normal.  I asked her to continue to monitor this.  3.  Hyperlipidemia: Currently on statin therapy.   Disposition: Follow up 1 yr   Prentice Docker, M.D., F.A.C.C.

## 2019-03-24 ENCOUNTER — Other Ambulatory Visit: Payer: Self-pay | Admitting: Family Medicine

## 2019-03-26 ENCOUNTER — Other Ambulatory Visit: Payer: Self-pay | Admitting: Family Medicine

## 2019-04-02 IMAGING — CT CT CHEST WITHOUT CONTRAST
2 of 3 series · 15 of 36 positions shown, 18 images · non-contrast
Comparison: Left rib radiographs dated 09/28/2016 and chest
radiographs dated 05/28/2010.

CLINICAL DATA: Right eye redness and drainage for 1 week. Clinical
concern for sepsis and clinical concern for a thoracic neoplasm.

EXAM:
CT CHEST WITHOUT CONTRAST
TECHNIQUE: Multidetector CT imaging of the chest was performed following the
standard protocol without IV contrast.

[Series 2: thorax · axial · 0.59mm/px · z∈[-766,-512]mm · 12 of 151 slices shown, 15 images]
[im 12/151  mediastinal]
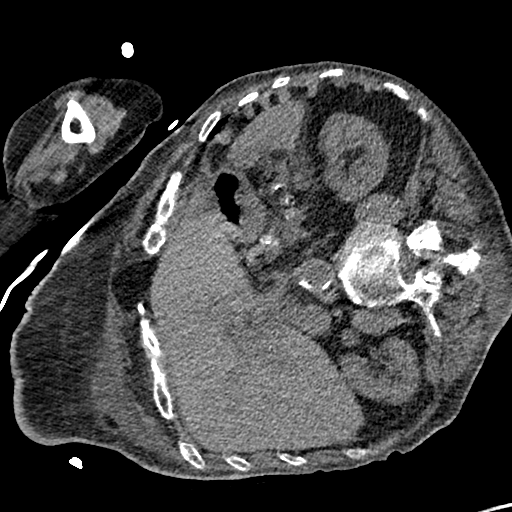
[im 12/151  lung]
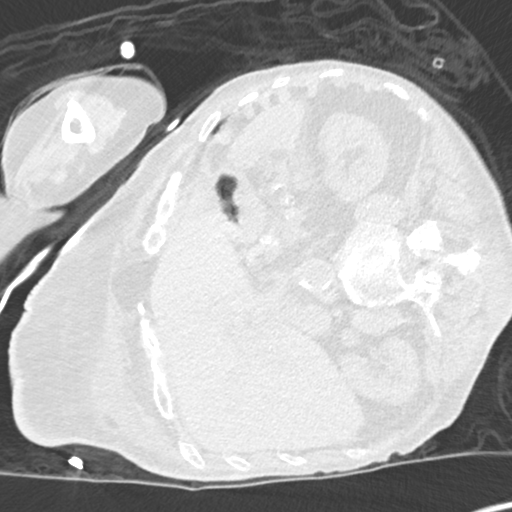
[im 23/151  lung]
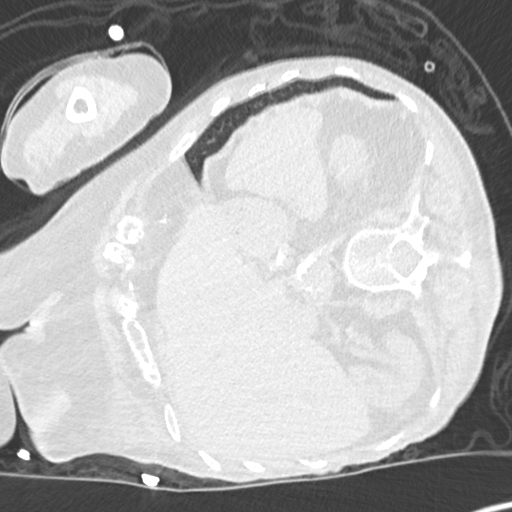
[im 34/151  lung]
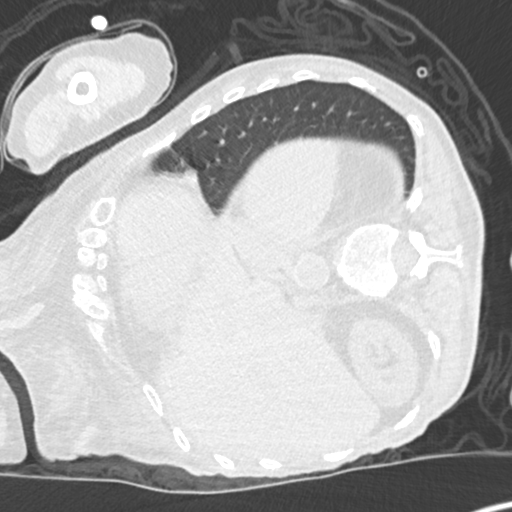
[im 45/151  lung]
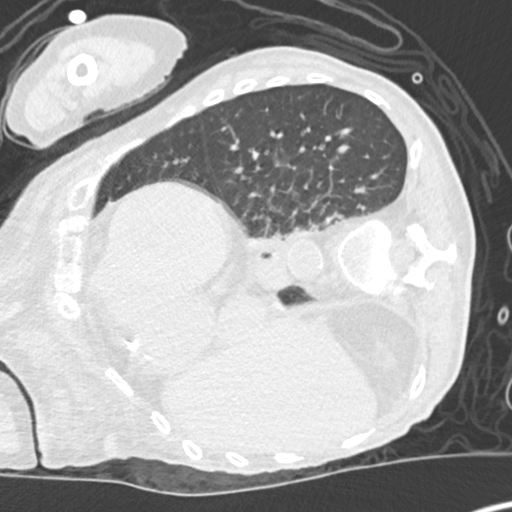
[im 56/151  mediastinal]
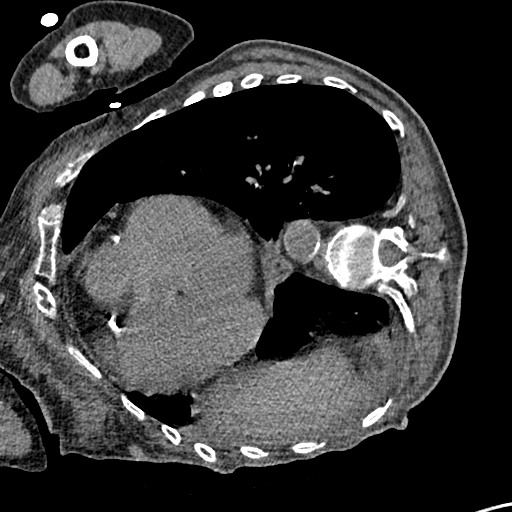
[im 56/151  lung]
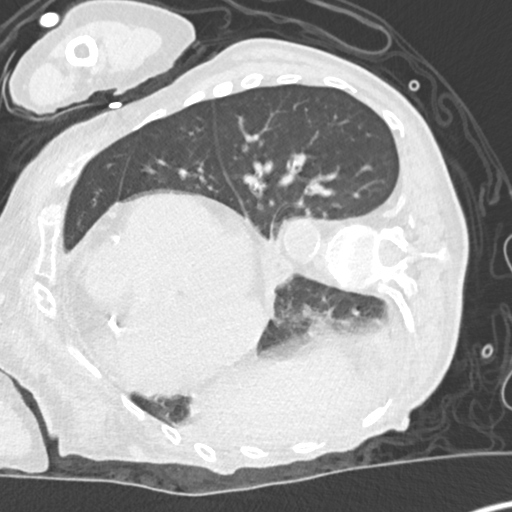
[im 67/151  lung]
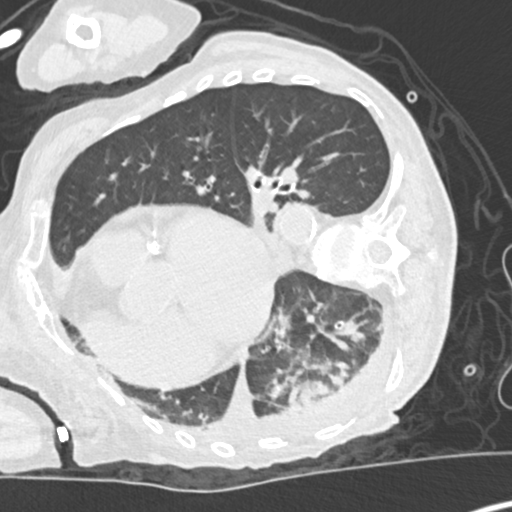
[im 84/151  lung]
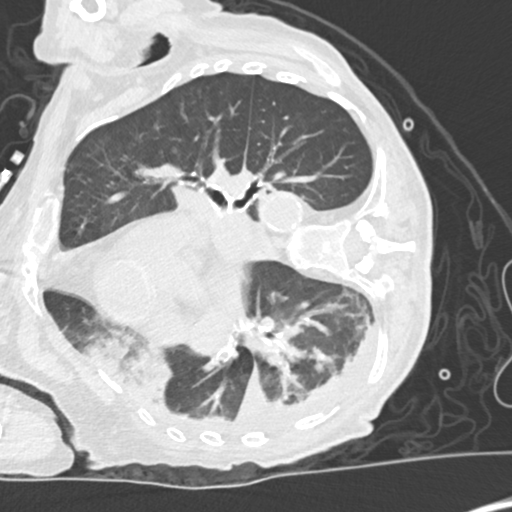
[im 95/151  lung]
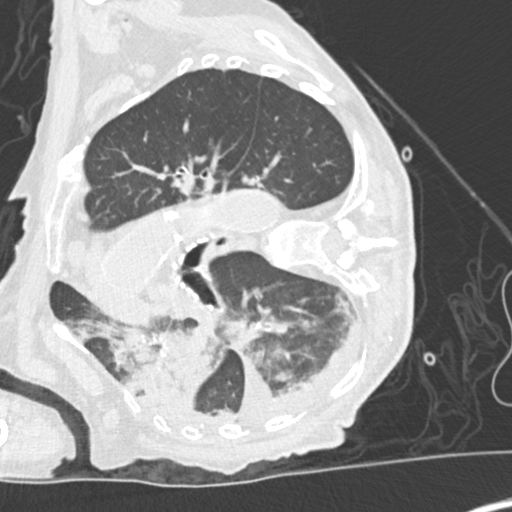
[im 106/151  mediastinal]
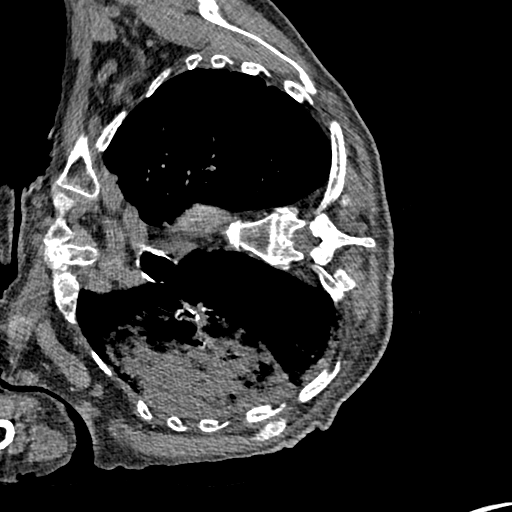
[im 106/151  lung]
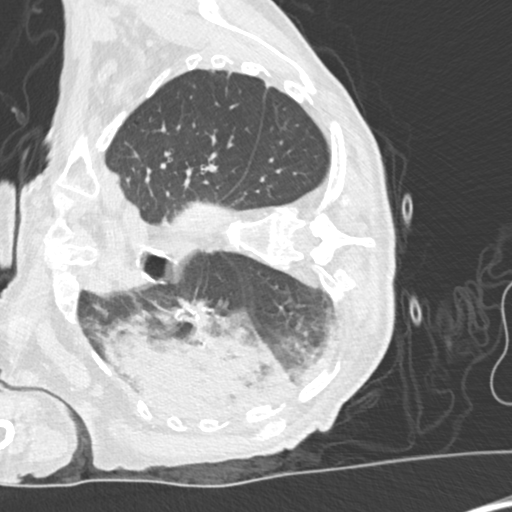
[im 117/151  lung]
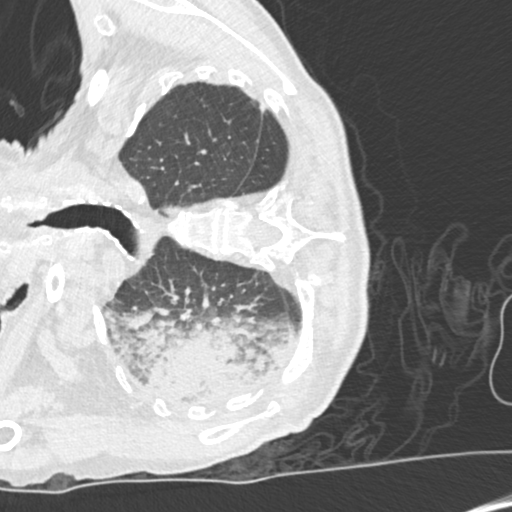
[im 128/151  lung]
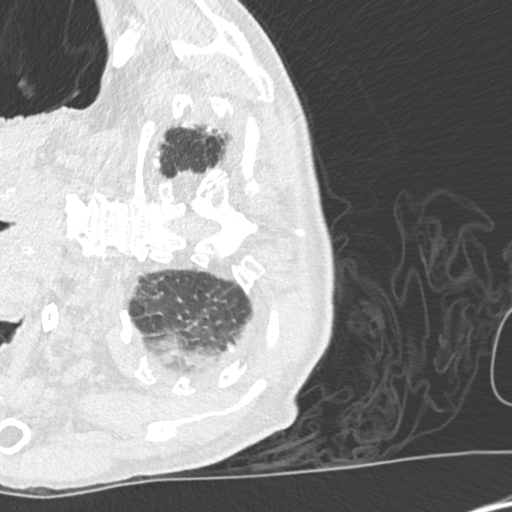
[im 139/151  lung]
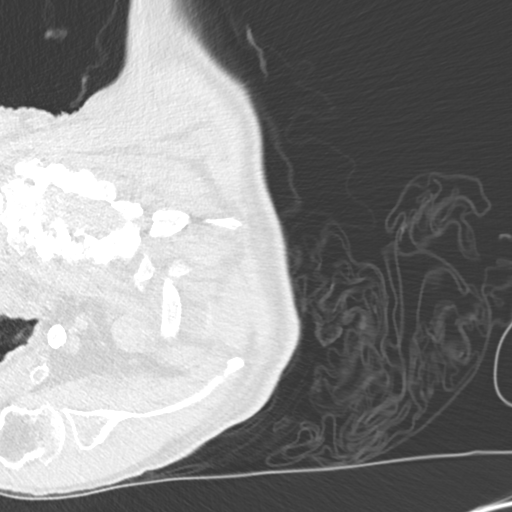

[Series 5: coronal · coronal · 0.59mm/px · 3 of 123 slices shown]
[im 25/123  lung]
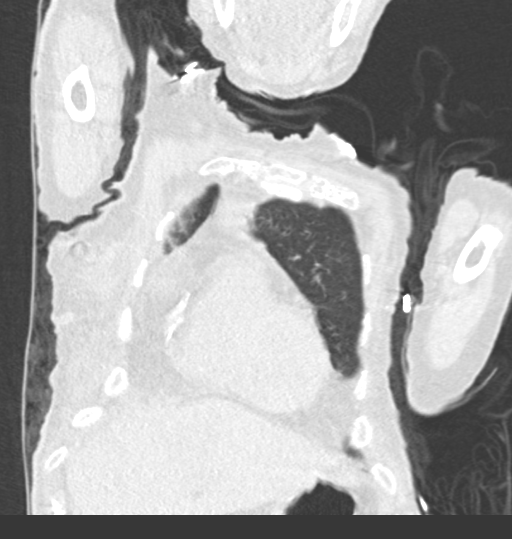
[im 49/123  lung]
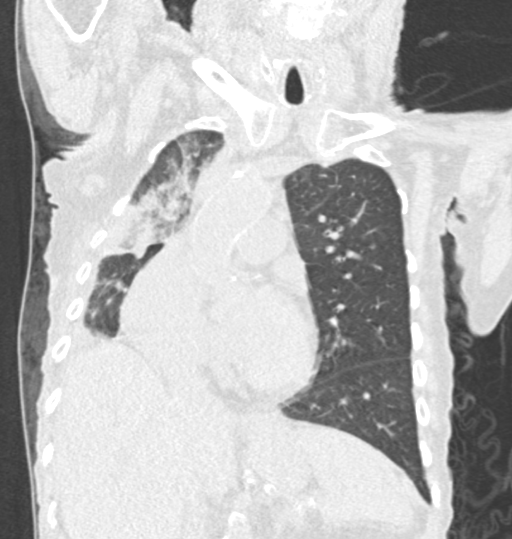
[im 74/123  lung]
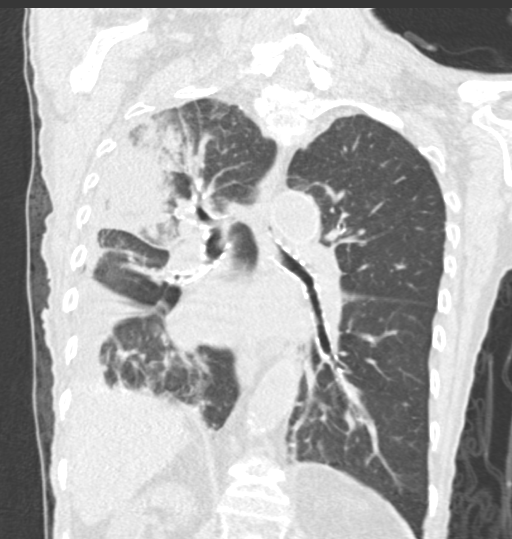

[15 of 36 positions shown; findings below may reference images not displayed]

FINDINGS: Cardiovascular: Atheromatous calcifications, including the coronary
arteries and aorta. Diffuse low density of the arterial blood
relative to the walls. Enlarged heart.

Mediastinum/Nodes: Mildly enlarged mediastinal nodes including a
superior mediastinal prevascular node with a short axis diameter of
11 mm on image number 58 series 2 and right pretracheal node with a
short axis diameter of 12 mm on image number 48 series 2. The hilar
nodes are difficult to assess due to lack of intravenous contrast.
Normal appearing thyroid gland.

Lungs/Pleura: Small right pleural effusion. Extensive dense patchy
airspace opacity in the right upper lobe. Less dense patchy airspace
opacity in the right lower lobe. Clear left lung, remaining
hyperexpanded with mild diffuse peribronchial thickening.

Upper Abdomen: Atheromatous arterial calcifications without
aneurysm.

Musculoskeletal: Thoracic and cervical spine degenerative changes.
Approximately 30% T12 superior endplate compression deformity with
mild to moderate bony retropulsion. No acute fracture lines. An
anterior fragment has fused with the remainder the vertebral body.
IMPRESSION: 1. Extensive right upper lobe pneumonia.
2. Less dense pneumonia in the right lower lobe.
3. Small right pleural effusion.
4. Mild mediastinal adenopathy, most likely reactive.
5. Changes of COPD and chronic bronchitis.
6. Cardiomegaly.
7.  Calcific coronary artery and aortic atherosclerosis.
8. Diffuse low density of the arterial blood relative to the walls.
This can be seen with anemia.

Aortic Atherosclerosis (XLYGE-7M2.2) and Emphysema (XLYGE-70U.W).

## 2019-04-24 ENCOUNTER — Other Ambulatory Visit: Payer: Self-pay | Admitting: Family Medicine

## 2019-04-24 DIAGNOSIS — Z760 Encounter for issue of repeat prescription: Secondary | ICD-10-CM

## 2019-04-26 ENCOUNTER — Other Ambulatory Visit: Payer: Self-pay

## 2019-04-26 ENCOUNTER — Ambulatory Visit (INDEPENDENT_AMBULATORY_CARE_PROVIDER_SITE_OTHER): Payer: Medicare Other | Admitting: *Deleted

## 2019-04-26 DIAGNOSIS — Z5181 Encounter for therapeutic drug level monitoring: Secondary | ICD-10-CM

## 2019-04-26 DIAGNOSIS — I749 Embolism and thrombosis of unspecified artery: Secondary | ICD-10-CM | POA: Diagnosis not present

## 2019-04-26 DIAGNOSIS — I4891 Unspecified atrial fibrillation: Secondary | ICD-10-CM

## 2019-04-26 LAB — POCT INR: INR: 2.3 (ref 2.0–3.0)

## 2019-04-26 NOTE — Patient Instructions (Signed)
Continue warfarin 4mg daily except 2.5mg on Mondays, Wednesdays and Fridays Recheck in 6 weeks 

## 2019-04-30 ENCOUNTER — Other Ambulatory Visit: Payer: Self-pay | Admitting: Family Medicine

## 2019-04-30 DIAGNOSIS — Z760 Encounter for issue of repeat prescription: Secondary | ICD-10-CM

## 2019-05-22 ENCOUNTER — Ambulatory Visit (INDEPENDENT_AMBULATORY_CARE_PROVIDER_SITE_OTHER): Payer: Medicare Other | Admitting: Family Medicine

## 2019-05-22 ENCOUNTER — Other Ambulatory Visit: Payer: Self-pay

## 2019-05-22 ENCOUNTER — Encounter: Payer: Self-pay | Admitting: Family Medicine

## 2019-05-22 VITALS — BP 160/80 | HR 96 | Temp 98.0°F | Resp 18

## 2019-05-22 DIAGNOSIS — Z760 Encounter for issue of repeat prescription: Secondary | ICD-10-CM

## 2019-05-22 DIAGNOSIS — I4891 Unspecified atrial fibrillation: Secondary | ICD-10-CM

## 2019-05-22 DIAGNOSIS — F0281 Dementia in other diseases classified elsewhere with behavioral disturbance: Secondary | ICD-10-CM

## 2019-05-22 DIAGNOSIS — R319 Hematuria, unspecified: Secondary | ICD-10-CM | POA: Diagnosis not present

## 2019-05-22 DIAGNOSIS — N3001 Acute cystitis with hematuria: Secondary | ICD-10-CM | POA: Diagnosis not present

## 2019-05-22 DIAGNOSIS — G301 Alzheimer's disease with late onset: Secondary | ICD-10-CM

## 2019-05-22 LAB — COMPREHENSIVE METABOLIC PANEL
AG Ratio: 1.1 (calc) (ref 1.0–2.5)
ALT: 3 U/L — ABNORMAL LOW (ref 6–29)
AST: 12 U/L (ref 10–35)
Albumin: 3.4 g/dL — ABNORMAL LOW (ref 3.6–5.1)
Alkaline phosphatase (APISO): 72 U/L (ref 37–153)
BUN/Creatinine Ratio: 14 (calc) (ref 6–22)
BUN: 26 mg/dL — ABNORMAL HIGH (ref 7–25)
CO2: 22 mmol/L (ref 20–32)
Calcium: 9.2 mg/dL (ref 8.6–10.4)
Chloride: 107 mmol/L (ref 98–110)
Creat: 1.84 mg/dL — ABNORMAL HIGH (ref 0.60–0.88)
Globulin: 3.2 g/dL (calc) (ref 1.9–3.7)
Glucose, Bld: 94 mg/dL (ref 65–99)
Potassium: 3.9 mmol/L (ref 3.5–5.3)
Sodium: 140 mmol/L (ref 135–146)
Total Bilirubin: 0.5 mg/dL (ref 0.2–1.2)
Total Protein: 6.6 g/dL (ref 6.1–8.1)

## 2019-05-22 LAB — CBC WITH DIFFERENTIAL/PLATELET
Absolute Monocytes: 511 cells/uL (ref 200–950)
Basophils Absolute: 41 cells/uL (ref 0–200)
Basophils Relative: 0.6 %
Eosinophils Absolute: 1166 cells/uL — ABNORMAL HIGH (ref 15–500)
Eosinophils Relative: 16.9 %
HCT: 33.8 % — ABNORMAL LOW (ref 35.0–45.0)
Hemoglobin: 11.1 g/dL — ABNORMAL LOW (ref 11.7–15.5)
Lymphs Abs: 2339 cells/uL (ref 850–3900)
MCH: 31.4 pg (ref 27.0–33.0)
MCHC: 32.8 g/dL (ref 32.0–36.0)
MCV: 95.5 fL (ref 80.0–100.0)
MPV: 11.5 fL (ref 7.5–12.5)
Monocytes Relative: 7.4 %
Neutro Abs: 2843 cells/uL (ref 1500–7800)
Neutrophils Relative %: 41.2 %
Platelets: 252 10*3/uL (ref 140–400)
RBC: 3.54 10*6/uL — ABNORMAL LOW (ref 3.80–5.10)
RDW: 13 % (ref 11.0–15.0)
Total Lymphocyte: 33.9 %
WBC: 6.9 10*3/uL (ref 3.8–10.8)

## 2019-05-22 LAB — URINALYSIS, ROUTINE W REFLEX MICROSCOPIC
Bacteria, UA: NONE SEEN /HPF
Bilirubin Urine: NEGATIVE
Glucose, UA: NEGATIVE
Hyaline Cast: NONE SEEN /LPF
Ketones, ur: NEGATIVE
Nitrite: NEGATIVE
Specific Gravity, Urine: 1.025 (ref 1.001–1.03)
Squamous Epithelial / HPF: NONE SEEN /HPF (ref <=5)
pH: 7 (ref 5.0–8.0)

## 2019-05-22 LAB — MICROSCOPIC MESSAGE

## 2019-05-22 MED ORDER — CEPHALEXIN 250 MG PO CAPS: 250.0000 mg | ORAL_CAPSULE | Freq: Three times a day (TID) | ORAL | 0 refills | Status: DC

## 2019-05-22 MED ORDER — METOPROLOL TARTRATE 25 MG PO TABS: 25.0000 mg | ORAL_TABLET | Freq: Two times a day (BID) | ORAL | 2 refills | Status: DC

## 2019-05-22 MED ORDER — DONEPEZIL HCL 10 MG PO TABS: ORAL_TABLET | ORAL | 2 refills | Status: DC

## 2019-05-22 MED ORDER — OMEPRAZOLE 40 MG PO CPDR: 40.0000 mg | DELAYED_RELEASE_CAPSULE | Freq: Every day | ORAL | 2 refills | Status: DC

## 2019-05-22 MED ORDER — FAMOTIDINE 40 MG PO TABS: ORAL_TABLET | ORAL | 2 refills | Status: DC

## 2019-05-22 MED ORDER — PRAVASTATIN SODIUM 10 MG PO TABS: 10.0000 mg | ORAL_TABLET | Freq: Every day | ORAL | 3 refills | Status: AC

## 2019-05-22 MED ORDER — LORAZEPAM 1 MG PO TABS: ORAL_TABLET | ORAL | 4 refills | Status: DC

## 2019-05-22 NOTE — Addendum Note (Signed)
Addended by: Milinda Antis F on: 05/22/2019 02:01 PM   Modules accepted: Orders

## 2019-05-22 NOTE — Progress Notes (Signed)
Subjective:    Patient ID: [name], female    DOB: [birth year], 86 y.o.   MRN: [id number]  Patient presents for Hematuria  Patient here with her daughter.  She has dementia cannot give any history.  Nurse aide is also here.  Over the weekend the nurse aide noted blood in her to pain after she had had a bowel movement.  Thought that this was associated with a bowel movement but also not sure if it was urine related.  Her daughter noticed every time she would change her over the next 24 hours she had a pink tinge in the urine.  She has been acting herself she has not had any fever no change in her weakness or appetite.  Is on Coumadin therapy which has been therapeutic.  They have not noticed any further gross red bleeding from either the rectum or vagina.  Upon entering the room her blood pressure significant elevated however she was upset and talking.  She had not had her metoprolol.  On review of medication she has not been on the amlodipine what sounds like since May of last year.  She is also not on quetiapine  Per her daughter her blood pressure has been good at home. Review Of Systems: Unable to obtain from patient  GEN- denies fatigue, fever, weight loss,weakness, recent illness HEENT- denies eye drainage, change in vision, nasal discharge, CVS- denies chest pain, palpitations RESP- denies SOB, cough, wheeze ABD- denies N/V, change in stools, abd pain GU- denies dysuria, +hematuria, dribbling, incontinence MSK- denies joint pain, muscle aches, injury Neuro- denies headache, dizziness, syncope, seizure activity       Objective:    BP (!) 160/80   Pulse 96   Temp 98 F (36.7 C) (Temporal)   Resp 18   SpO2 99%  GEN- NAD, alert oriented to person.  Sitting in wheelchair HEENT- PERRL, EOMI, non injected sclera, pink conjunctiva, MMM, oropharynx clear Neck- Supple, no thyromegaly CVS- Irregular rhythem, no murmur RESP-CTAB ABD-NABS,soft,NT,ND, no CVA tenderness GU-no  excoriations or lacerations noted around the vulvar region.  No external hemorrhoids.  Fair tone in the rectum fecal blood test negative soft brown stool in the vault. Clean cath obtain clear urine specimen no gross blood seen EXT- No edema Pulses- Radial 2+        Assessment & Plan:      Problem List Items Addressed This Visit      Unprioritized   ATRIAL FIBRILLATION, CHRONIC    Atrial fibrillation.  She was given her metoprolol here in the office.  Her blood pressure started to come down.  We will have her family monitor her blood pressure at home she seemed at her baseline do not appear to be in any type of cardiorespiratory distress.  With Regards to the hematuria this was noted 2+ on urinalysis about 3-10 on  microscopy.  We will send her urine for culture she does have history of recurrent urinary tract infections.  I do not note any blood in the stool that is possible she has some internal hemorrhoids that could have bled with a large bowel movement.  We will start her on Keflex 250 3 times daily and send urine off for culture.  Also check her CBC and metabolic panel.      Relevant Orders   CBC with Differential/Platelet   Comprehensive metabolic panel   UTI (urinary tract infection)   Relevant Medications   cephALEXin (KEFLEX) 250 MG capsule  Other Visit Diagnoses    Hematuria, unspecified type    -  Primary   Relevant Orders   CBC with Differential/Platelet   Comprehensive metabolic panel   Urinalysis, Routine w reflex microscopic   Urine Culture      Note: This dictation was prepared with Dragon dictation along with smaller phrase technology. Any transcriptional errors that result from this process are unintentional.

## 2019-05-22 NOTE — Patient Instructions (Addendum)
We will call with results F/U 6 months  

## 2019-05-22 NOTE — Assessment & Plan Note (Signed)
Atrial fibrillation.  She was given her metoprolol here in the office.  Her blood pressure started to come down.  We will have her family monitor her blood pressure at home she seemed at her baseline do not appear to be in any type of cardiorespiratory distress.  With Regards to the hematuria this was noted 2+ on urinalysis about 3-10 on  microscopy.  We will send her urine for culture she does have history of recurrent urinary tract infections.  I do not note any blood in the stool that is possible she has some internal hemorrhoids that could have bled with a large bowel movement.  We will start her on Keflex 250 3 times daily and send urine off for culture.  Also check her CBC and metabolic panel.

## 2019-05-25 LAB — URINE CULTURE
MICRO NUMBER:: 10056478
SPECIMEN QUALITY:: ADEQUATE

## 2019-06-07 ENCOUNTER — Ambulatory Visit (INDEPENDENT_AMBULATORY_CARE_PROVIDER_SITE_OTHER): Payer: Medicare Other | Admitting: *Deleted

## 2019-06-07 DIAGNOSIS — Z5181 Encounter for therapeutic drug level monitoring: Secondary | ICD-10-CM

## 2019-06-07 DIAGNOSIS — I4891 Unspecified atrial fibrillation: Secondary | ICD-10-CM | POA: Diagnosis not present

## 2019-06-07 DIAGNOSIS — I749 Embolism and thrombosis of unspecified artery: Secondary | ICD-10-CM

## 2019-06-07 LAB — POCT INR: INR: 2 (ref 2.0–3.0)

## 2019-06-07 NOTE — Patient Instructions (Signed)
Continue warfarin 4mg  daily except 2.5mg  on Mondays, Wednesdays and Fridays Recheck in 6 weeks

## 2019-08-21 ENCOUNTER — Ambulatory Visit (INDEPENDENT_AMBULATORY_CARE_PROVIDER_SITE_OTHER): Payer: Medicare Other | Admitting: *Deleted

## 2019-08-21 ENCOUNTER — Other Ambulatory Visit: Payer: Self-pay

## 2019-08-21 DIAGNOSIS — I4821 Permanent atrial fibrillation: Secondary | ICD-10-CM | POA: Diagnosis not present

## 2019-08-21 DIAGNOSIS — Z5181 Encounter for therapeutic drug level monitoring: Secondary | ICD-10-CM

## 2019-08-21 DIAGNOSIS — I749 Embolism and thrombosis of unspecified artery: Secondary | ICD-10-CM | POA: Diagnosis not present

## 2019-08-21 LAB — POCT INR: INR: 2.5 (ref 2.0–3.0)

## 2019-08-21 NOTE — Patient Instructions (Signed)
Continue warfarin 4mg  daily except 2.5mg  on Mondays, Wednesdays and Fridays Recheck in 6 weeks

## 2019-09-24 ENCOUNTER — Other Ambulatory Visit: Payer: Self-pay | Admitting: Family Medicine

## 2019-10-02 ENCOUNTER — Other Ambulatory Visit: Payer: Self-pay

## 2019-10-02 ENCOUNTER — Ambulatory Visit (INDEPENDENT_AMBULATORY_CARE_PROVIDER_SITE_OTHER): Payer: Medicare Other | Admitting: *Deleted

## 2019-10-02 DIAGNOSIS — I4891 Unspecified atrial fibrillation: Secondary | ICD-10-CM | POA: Diagnosis not present

## 2019-10-02 DIAGNOSIS — I4821 Permanent atrial fibrillation: Secondary | ICD-10-CM

## 2019-10-02 DIAGNOSIS — Z5181 Encounter for therapeutic drug level monitoring: Secondary | ICD-10-CM

## 2019-10-02 DIAGNOSIS — I749 Embolism and thrombosis of unspecified artery: Secondary | ICD-10-CM | POA: Diagnosis not present

## 2019-10-02 LAB — POCT INR: INR: 1.5 — AB (ref 2.0–3.0)

## 2019-10-02 NOTE — Patient Instructions (Signed)
Take 6.5mg  tonight, 5mg  tomorrow night then resume 4mg  daily except 2.5mg  on Mondays, Wednesdays and Fridays Recheck in 3 weeks

## 2019-10-12 ENCOUNTER — Telehealth: Payer: Self-pay | Admitting: Cardiovascular Disease

## 2019-10-12 NOTE — Telephone Encounter (Signed)
Returned call and advised pt's daughter that dose increase of warfarin is not likely to be causing her diarrhea. She also was just given a boost of her warfarin dose on 6/1 and 6/2 then advised to resume her normal dose, so this should not be contributing.

## 2019-10-12 NOTE — Telephone Encounter (Signed)
RENESA EAVES -DAUGHTER [phone] called stating that patient's coumdin was increased . Patient has started having diarrhea. Wanting to know if the increase could be causing this.

## 2019-10-18 ENCOUNTER — Other Ambulatory Visit: Payer: Self-pay | Admitting: Cardiovascular Disease

## 2019-10-31 ENCOUNTER — Ambulatory Visit (INDEPENDENT_AMBULATORY_CARE_PROVIDER_SITE_OTHER): Payer: Medicare Other | Admitting: *Deleted

## 2019-10-31 DIAGNOSIS — Z5181 Encounter for therapeutic drug level monitoring: Secondary | ICD-10-CM | POA: Diagnosis not present

## 2019-10-31 DIAGNOSIS — I4891 Unspecified atrial fibrillation: Secondary | ICD-10-CM | POA: Diagnosis not present

## 2019-10-31 DIAGNOSIS — I749 Embolism and thrombosis of unspecified artery: Secondary | ICD-10-CM | POA: Diagnosis not present

## 2019-10-31 DIAGNOSIS — I4821 Permanent atrial fibrillation: Secondary | ICD-10-CM

## 2019-10-31 LAB — POCT INR: INR: 1.4 — AB (ref 2.0–3.0)

## 2019-10-31 NOTE — Patient Instructions (Signed)
Increase warfarin to 4mg  daily Recheck in 3 weeks

## 2019-11-21 ENCOUNTER — Ambulatory Visit (INDEPENDENT_AMBULATORY_CARE_PROVIDER_SITE_OTHER): Payer: Medicare Other | Admitting: *Deleted

## 2019-11-21 DIAGNOSIS — I4821 Permanent atrial fibrillation: Secondary | ICD-10-CM

## 2019-11-21 DIAGNOSIS — Z5181 Encounter for therapeutic drug level monitoring: Secondary | ICD-10-CM | POA: Diagnosis not present

## 2019-11-21 DIAGNOSIS — I749 Embolism and thrombosis of unspecified artery: Secondary | ICD-10-CM

## 2019-11-21 LAB — POCT INR: INR: 1.4 — AB (ref 2.0–3.0)

## 2019-11-21 NOTE — Patient Instructions (Signed)
Increase warfarin to 4mg  daily except 6mg  on Mondays, Wednesdays and Fridays Recheck in 3 weeks

## 2019-11-27 ENCOUNTER — Other Ambulatory Visit: Payer: Self-pay | Admitting: Family Medicine

## 2019-12-10 ENCOUNTER — Other Ambulatory Visit: Payer: Self-pay | Admitting: *Deleted

## 2019-12-10 MED ORDER — WARFARIN SODIUM 2.5 MG PO TABS: ORAL_TABLET | ORAL | 1 refills | Status: AC

## 2019-12-12 ENCOUNTER — Other Ambulatory Visit: Payer: Self-pay

## 2019-12-12 ENCOUNTER — Ambulatory Visit (INDEPENDENT_AMBULATORY_CARE_PROVIDER_SITE_OTHER): Payer: Medicare Other | Admitting: *Deleted

## 2019-12-12 DIAGNOSIS — I4821 Permanent atrial fibrillation: Secondary | ICD-10-CM

## 2019-12-12 DIAGNOSIS — Z5181 Encounter for therapeutic drug level monitoring: Secondary | ICD-10-CM

## 2019-12-12 DIAGNOSIS — I749 Embolism and thrombosis of unspecified artery: Secondary | ICD-10-CM

## 2019-12-12 LAB — POCT INR: INR: 3 (ref 2.0–3.0)

## 2019-12-12 NOTE — Patient Instructions (Signed)
Continue warfarin 4mg  daily except 6mg  on Mondays, Wednesdays and Fridays Recheck in 5 weeks

## 2020-01-04 ENCOUNTER — Other Ambulatory Visit: Payer: Self-pay | Admitting: *Deleted

## 2020-01-04 MED ORDER — WARFARIN SODIUM 4 MG PO TABS: ORAL_TABLET | ORAL | 3 refills | Status: DC

## 2020-01-16 ENCOUNTER — Other Ambulatory Visit: Payer: Self-pay

## 2020-01-16 ENCOUNTER — Ambulatory Visit (INDEPENDENT_AMBULATORY_CARE_PROVIDER_SITE_OTHER): Payer: Medicare Other | Admitting: *Deleted

## 2020-01-16 DIAGNOSIS — I4891 Unspecified atrial fibrillation: Secondary | ICD-10-CM

## 2020-01-16 DIAGNOSIS — Z5181 Encounter for therapeutic drug level monitoring: Secondary | ICD-10-CM

## 2020-01-16 DIAGNOSIS — I4821 Permanent atrial fibrillation: Secondary | ICD-10-CM | POA: Diagnosis not present

## 2020-01-16 DIAGNOSIS — I749 Embolism and thrombosis of unspecified artery: Secondary | ICD-10-CM | POA: Diagnosis not present

## 2020-01-16 LAB — POCT INR: INR: 3 (ref 2.0–3.0)

## 2020-01-16 NOTE — Patient Instructions (Signed)
Continue warfarin 4mg  daily except 6mg  on Mondays, Wednesdays and Fridays Recheck in 6 weeks

## 2020-01-30 ENCOUNTER — Other Ambulatory Visit: Payer: Self-pay | Admitting: Family Medicine

## 2020-01-31 MED ORDER — FAMOTIDINE 40 MG PO TABS: ORAL_TABLET | ORAL | 0 refills | Status: DC

## 2020-02-24 ENCOUNTER — Other Ambulatory Visit: Payer: Self-pay | Admitting: Family Medicine

## 2020-02-25 ENCOUNTER — Other Ambulatory Visit: Payer: Self-pay | Admitting: *Deleted

## 2020-02-25 MED ORDER — WARFARIN SODIUM 4 MG PO TABS
ORAL_TABLET | ORAL | 3 refills | Status: DC
Start: 2020-02-25 — End: 2020-07-17

## 2020-03-19 NOTE — Progress Notes (Signed)
Cardiology Office Note  Date: 03/20/2020   ID: [name], DOB 11-26-1928, MRN [id number]  PCP:  [name], MD  Cardiologist:  No primary care provider on file. Electrophysiologist:  None   Chief Complaint: Follow-up permanent atrial fibrillation, HTN, HLD  History of Present Illness: [name] is a 86 y.o. female with a history of permanent atrial fibrillation, HTN, HLD, anemia, CKD stage III.  Last encounter with Dr. Purvis Sheffield 03/16/2019.  Her heart rate was controlled on metoprolol 25 mg p.o. twice daily.  She was being anticoagulated with warfarin.  Aspirin was stopped.  Blood pressure was well controlled on amlodipine 10 mg daily.  Hyperlipidemia currently controlled with statin therapy.  Is here for 1 year follow-up today.  On arrival her EKG demonstrates atrial fibrillation with RVR.  Rate of 131.  Patient states she has been feeling a lot more tired recently.  She is unaware of her heart beating fast.  She denies any anginal or exertional symptoms.  She is not very active on a daily basis.  Basically sedentary.  Blood pressure is elevated on arrival as it was in January of this year at 160/80.  Denies any CVA or TIA-like symptoms, orthostatic symptoms, PND, orthopnea, bleeding, DVT or PE-like symptoms, lower extremity edema.  Past Medical History:  Diagnosis Date  . Anemia    Hemoglobin of 11.6 in 01/2010; 12.1 in 08/2010  . Atrial fibrillation (HCC)    chronic requiring anticoagulation  . Chronic anticoagulation   . Chronic kidney disease    Stage III; creatinine of 1.7 in 04/2007 and 1.5 2009; 1.7 in 04/2010  . Hyperlipidemia    Total cholesterol of 226, triglycerides of 90, HDL of 49 and LDL of 159 in 01/2010  . Hypertension     Past Surgical History:  Procedure Laterality Date  . APPENDECTOMY    . BUNIONECTOMY  1995  . PARTIAL HYSTERECTOMY      Current Outpatient Medications  Medication Sig Dispense Refill  . donepezil (ARICEPT) 10 MG tablet Take  1 tablet by mouth once daily with breakfast 90 tablet 2  . famotidine (PEPCID) 40 MG tablet TAKE 1/2 (ONE-HALF) TABLET BY MOUTH TWICE DAILY FOR ITCHING AND REFLUX 60 tablet 0  . hydrOXYzine (ATARAX/VISTARIL) 10 MG tablet Take 1 tablet (10 mg total) by mouth 3 (three) times daily as needed for itching.    Marland Kitchen LORazepam (ATIVAN) 1 MG tablet TAKE 1 TABLET BY MOUTH AT BEDTIME AS NEEDED FOR ANXIETY AND AGITATION 30 tablet 4  . omeprazole (PRILOSEC) 40 MG capsule Take 1 capsule by mouth once daily 90 capsule 3  . pravastatin (PRAVACHOL) 10 MG tablet Take 1 tablet (10 mg total) by mouth daily. 90 tablet 3  . warfarin (COUMADIN) 2.5 MG tablet TAKE AS DIRECTED BY COUMADIN CLINIC 30 tablet 1  . warfarin (COUMADIN) 4 MG tablet TAKE 1 TABLET (4 MG) BY MOUTH ONCE DAILY EXCEPT 1.5 TABLETS (6 MG) ON  MONDAY  WEDNESDAY  AND  FRIDAY 40 tablet 3  . amLODipine (NORVASC) 5 MG tablet Take 1 tablet (5 mg total) by mouth daily. 90 tablet 1  . metoprolol tartrate (LOPRESSOR) 50 MG tablet Take 1 tablet (50 mg total) by mouth 2 (two) times daily. 180 tablet 3   No current facility-administered medications for this visit.   Allergies:  Patient has no known allergies.   Social History: The patient  reports that she has never smoked. She has never used smokeless tobacco. She reports that  she does not drink alcohol and does not use drugs.   Family History: The patient's family history includes Diabetes in her brother, mother, and sister; Heart disease in her father.   ROS:  Please see the history of present illness. Otherwise, complete review of systems is positive for none.  All other systems are reviewed and negative.   Physical Exam: VS:  BP (!) 160/80   Pulse (!) 108   Ht 5' (1.524 m)   Wt 102 lb 12.8 oz (46.6 kg)   SpO2 95%   BMI 20.08 kg/m , BMI Body mass index is 20.08 kg/m.  Wt Readings from Last 3 Encounters:  03/20/20 102 lb 12.8 oz (46.6 kg)  07/15/18 150 lb (68 kg)  04/25/18 140 lb (63.5 kg)     General: Patient appears comfortable at rest. Neck: Supple, no elevated JVP or carotid bruits, no thyromegaly. Lungs: Clear to auscultation, nonlabored breathing at rest. Cardiac: Irregularly irregular tachycardic rate and rhythm, no S3 or significant systolic murmur, no pericardial rub. Extremities: No pitting edema, distal pulses 2+. Skin: Warm and dry. Musculoskeletal: No kyphosis. Neuropsychiatric: Alert and oriented x3, affect grossly appropriate.  ECG:  An ECG dated 03/20/2020 was personally reviewed today and demonstrated:  Atrial fibrillation with RVR rate of 131  Recent Labwork: 05/22/2019: ALT 3; AST 12; BUN 26; Creat 1.84; Hemoglobin 11.1; Platelets 252; Potassium 3.9; Sodium 140     Component Value Date/Time   CHOL 173 09/17/2016 1156   TRIG 101 09/17/2016 1156   HDL 52 09/17/2016 1156   CHOLHDL 3.3 09/17/2016 1156   VLDL 20 09/17/2016 1156   LDLCALC 101 (H) 09/17/2016 1156    Other Studies Reviewed Today:   Assessment and Plan:  1. Permanent atrial fibrillation (HCC)   2. Essential hypertension   3. Mixed hyperlipidemia    1.  Permanent atrial fibrillation (HCC) EKG today on arrival shows atrial fibrillation with RVR rate of 131.  Patient denies any shortness of breath, chest pain, dizziness.  She is not very active on a daily basis and is very sedentary.  States she has been feeling more tired than usual.  Increase metoprolol to 50 mg p.o. twice daily.  Continue Coumadin as directed by Coumadin clinic.  2. Essential hypertension Blood pressure is elevated today at 160/80 as it was in January.  Start amlodipine 5 mg p.o. daily.  3. Mixed hyperlipidemia Continue pravastatin 10 mg p.o. daily.  Medication Adjustments/Labs and Tests Ordered: Current medicines are reviewed at length with the patient today.  Concerns regarding medicines are outlined above.   Disposition: Follow-up with Dr. Wyline Mood or APP 1 month  Signed, Rennis Harding, NP 03/20/2020 2:21 PM     [facility] at [facility] [address] [place], [place], [place] [address] Phone: [phone]; Fax: [phone]

## 2020-03-20 ENCOUNTER — Ambulatory Visit (INDEPENDENT_AMBULATORY_CARE_PROVIDER_SITE_OTHER): Payer: Medicare Other | Admitting: Family Medicine

## 2020-03-20 ENCOUNTER — Encounter: Payer: Self-pay | Admitting: Family Medicine

## 2020-03-20 ENCOUNTER — Ambulatory Visit (INDEPENDENT_AMBULATORY_CARE_PROVIDER_SITE_OTHER): Payer: Medicare Other | Admitting: *Deleted

## 2020-03-20 VITALS — BP 160/80 | HR 108 | Ht 60.0 in | Wt 102.8 lb

## 2020-03-20 DIAGNOSIS — I749 Embolism and thrombosis of unspecified artery: Secondary | ICD-10-CM | POA: Diagnosis not present

## 2020-03-20 DIAGNOSIS — I480 Paroxysmal atrial fibrillation: Secondary | ICD-10-CM

## 2020-03-20 DIAGNOSIS — I10 Essential (primary) hypertension: Secondary | ICD-10-CM

## 2020-03-20 DIAGNOSIS — E782 Mixed hyperlipidemia: Secondary | ICD-10-CM

## 2020-03-20 DIAGNOSIS — Z5181 Encounter for therapeutic drug level monitoring: Secondary | ICD-10-CM | POA: Diagnosis not present

## 2020-03-20 DIAGNOSIS — I4821 Permanent atrial fibrillation: Secondary | ICD-10-CM

## 2020-03-20 LAB — POCT INR: INR: 2.4 (ref 2.0–3.0)

## 2020-03-20 MED ORDER — METOPROLOL TARTRATE 50 MG PO TABS: 50.0000 mg | ORAL_TABLET | Freq: Two times a day (BID) | ORAL | 3 refills | Status: DC

## 2020-03-20 MED ORDER — AMLODIPINE BESYLATE 5 MG PO TABS: 5.0000 mg | ORAL_TABLET | Freq: Every day | ORAL | 1 refills | Status: DC

## 2020-03-20 NOTE — Patient Instructions (Signed)
Continue warfarin 4mg daily except 6mg on Mondays, Wednesdays and Fridays °Recheck in 6 weeks ° °

## 2020-03-20 NOTE — Patient Instructions (Addendum)
Medication Instructions:   Your physician has recommended you make the following change in your medication:   Increase metoprolol tartrate to 50 mg by mouth daily  Start amlodipine 5 mg by mouth daily  Continue other medications the same  Labwork:  None  Testing/Procedures:  None  Follow-Up:  Your physician recommends that you schedule a follow-up appointment in: 1 month.  Any Other Special Instructions Will Be Listed Below (If Applicable).  If you need a refill on your cardiac medications before your next appointment, please call your pharmacy.

## 2020-04-17 ENCOUNTER — Telehealth: Payer: Self-pay | Admitting: Family Medicine

## 2020-04-17 NOTE — Telephone Encounter (Signed)
Patient will need to be seen.   Call placed to patient daughter to make aware.   LM on VM to schedule an appointment.

## 2020-04-17 NOTE — Telephone Encounter (Signed)
Pt daughter call mom has a UTI can Dr.[place] prescribe medication

## 2020-04-18 ENCOUNTER — Other Ambulatory Visit: Payer: Self-pay | Admitting: Family Medicine

## 2020-04-18 ENCOUNTER — Telehealth: Payer: Self-pay | Admitting: Family Medicine

## 2020-04-18 ENCOUNTER — Ambulatory Visit: Payer: Medicare Other | Admitting: Nurse Practitioner

## 2020-04-18 MED ORDER — FAMOTIDINE 40 MG PO TABS: 20.0000 mg | ORAL_TABLET | Freq: Two times a day (BID) | ORAL | 11 refills | Status: AC

## 2020-04-18 NOTE — Progress Notes (Deleted)
Subjective:    Patient ID: [name], female    DOB: 1928/09/02, 86 y.o.   MRN: [id number]  HPI: [name] is a 86 y.o. female presenting for  No chief complaint on file.  URINARY SYMPTOMS Duration: Dysuria: {Blank single:19197::"yes","no","burning"} Urinary frequency: {Blank single:19197::"yes","no"} Urgency: {Blank single:19197::"yes","no"} Small volume voids: {Blank single:19197::"yes","no"} Symptom severity: {Blank single:19197::"yes","no"} Urinary incontinence: {Blank single:19197::"yes","no"} Foul odor: {Blank single:19197::"yes","no"} Hematuria: {Blank single:19197::"yes","no"} Abdominal pain: {Blank single:19197::"yes","no"} Back pain: {Blank single:19197::"yes","no"} Suprapubic pain/pressure: {Blank single:19197::"yes","no"} Flank pain: {Blank single:19197::"yes","no"} Fever:  {Blank multiple:19196::"yes","no","subjective","low grade"} Vomiting: {Blank single:19197::"yes","no"} Relief with cranberry juice: {Blank single:19197::"yes","no"} Relief with pyridium: {Blank single:19197::"yes","no"} Status: better/worse/stable Previous urinary tract infection: {Blank single:19197::"yes","no"} Recurrent urinary tract infection: {Blank single:19197::"yes","no"} Sexual activity: No sexually active/monogomous/practicing safe sex History of sexually transmitted disease: {Blank single:19197::"yes","no"} Penile discharge: {Blank single:19197::"yes","no"} Treatments attempted: {Blank multiple:19196::"none","antibiotics","pyridium","cranberry","increasing fluids"}   No Known Allergies  Outpatient Encounter Medications as of 04/18/2020  Medication Sig  . amLODipine (NORVASC) 5 MG tablet Take 1 tablet (5 mg total) by mouth daily.  Marland Kitchen donepezil (ARICEPT) 10 MG tablet Take 1 tablet by mouth once daily with breakfast  . famotidine (PEPCID) 40 MG tablet TAKE 1/2 (ONE-HALF) TABLET BY MOUTH TWICE DAILY FOR ITCHING AND REFLUX  . hydrOXYzine (ATARAX/VISTARIL) 10 MG tablet Take 1  tablet (10 mg total) by mouth 3 (three) times daily as needed for itching.  Marland Kitchen LORazepam (ATIVAN) 1 MG tablet TAKE 1 TABLET BY MOUTH AT BEDTIME AS NEEDED FOR ANXIETY AND AGITATION  . metoprolol tartrate (LOPRESSOR) 50 MG tablet Take 1 tablet (50 mg total) by mouth 2 (two) times daily.  Marland Kitchen omeprazole (PRILOSEC) 40 MG capsule Take 1 capsule by mouth once daily  . pravastatin (PRAVACHOL) 10 MG tablet Take 1 tablet (10 mg total) by mouth daily.  Marland Kitchen warfarin (COUMADIN) 2.5 MG tablet TAKE AS DIRECTED BY COUMADIN CLINIC  . warfarin (COUMADIN) 4 MG tablet TAKE 1 TABLET (4 MG) BY MOUTH ONCE DAILY EXCEPT 1.5 TABLETS (6 MG) ON  MONDAY  WEDNESDAY  AND  FRIDAY   No facility-administered encounter medications on file as of 04/18/2020.    Patient Active Problem List   Diagnosis Date Noted  . Cellulitis of right orbital region   . Periorbital cellulitis of left eye 07/15/2018  . CAP (community acquired pneumonia) 07/15/2018  . Impaired gait and mobility 05/24/2017  . Weakness 04/28/2016  . Loss of weight 10/22/2013  . Encounter for therapeutic drug monitoring 06/04/2013  . Dementia (HCC) 05/18/2013  . UTI (urinary tract infection) 10/05/2012  . Chronic insomnia 06/30/2012  . Headache(784.0) 03/03/2012  . Eosinophilia 11/27/2011  . Peripheral vascular disease (HCC) 09/29/2011  . Chronic kidney disease   . Tobacco abuse, in remission   . Chronic anticoagulation   . 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 Hyperlipidemia 01/01/2010  . Anemia 04/09/2009  . Hypertension 04/09/2009  . ATRIAL FIBRILLATION, CHRONIC 04/09/2009    Past Medical History:  Diagnosis Date  . Anemia    Hemoglobin of 11.6 in 01/2010; 12.1 in 08/2010  . Atrial fibrillation (HCC)    chronic requiring anticoagulation  . Chronic anticoagulation   . Chronic kidney disease    Stage III; creatinine of 1.7 in 04/2007 and 1.5 2009; 1.7 in 04/2010  . Hyperlipidemia    Total cholesterol of 226, triglycerides of  90, HDL of 49 and LDL of 159 in 01/2010  . Hypertension     Relevant past medical, surgical, family and social history reviewed and updated as  indicated. Interim medical history since our last visit reviewed.  Review of Systems  Per HPI unless specifically indicated above     Objective:    There were no vitals taken for this visit.  Wt Readings from Last 3 Encounters:  03/20/20 102 lb 12.8 oz (46.6 kg)  07/15/18 150 lb (68 kg)  04/25/18 140 lb (63.5 kg)    Physical Exam  Results for orders placed or performed in visit on 03/20/20  POCT INR  Result Value Ref Range   INR 2.4 2.0 - 3.0      Assessment & Plan:   Problem List Items Addressed This Visit   None      Follow up plan: No follow-ups on file.

## 2020-04-18 NOTE — Telephone Encounter (Signed)
Prescription sent to pharmacy.

## 2020-04-18 NOTE — Telephone Encounter (Signed)
Refill acid reflux medication

## 2020-04-20 NOTE — Progress Notes (Signed)
Cardiology Office Note  Date: 04/21/2020   ID: Isabel, [name] Jun 26, 1928, MRN [id number]  PCP:  [name], MD  Cardiologist:  No primary care provider on file. Electrophysiologist:  None   Chief Complaint: Follow-up permanent atrial fibrillation, HTN, HLD  History of Present Illness: [name] is a 86 y.o. female with a history of permanent atrial fibrillation, HTN, HLD, anemia, CKD stage III.  Last encounter with Dr. Purvis Sheffield 03/16/2019.  Her heart rate was controlled on metoprolol 25 mg p.o. twice daily.  She was being anticoagulated with warfarin.  Aspirin was stopped.  Blood pressure was well controlled on amlodipine 10 mg daily.  Hyperlipidemia currently controlled with statin therapy.  Last visit she was here for 1 year follow-up.  On arrival her EKG demonstrated atrial fibrillation with RVR.  Rate of 131.  Stated she had been feeling a lot more tired recently.  She was unaware of her heart beating fast.  She denied any anginal or exertional symptoms.  She is not very active on a daily basis.  Basically sedentary.  Blood pressure was elevated on arrival as it was in January of this year at 160/80.  Denied any CVA or TIA-like symptoms, orthostatic symptoms, PND, orthopnea, bleeding, DVT or PE-like symptoms, lower extremity edema.  She is here for follow-up today.  Heart rate is now controlled with a rate of 67 since increasing metoprolol 50 mg p.o. twice daily.  She denies any sensation of palpitations or arrhythmias, orthostatic symptoms, CVA or TIA-like symptoms, PND, orthopnea, bleeding, claudication, DVT or PE-like symptoms, lower extremity edema.  Blood pressure remains elevated even after starting amlodipine at last visit.  Blood pressure today on arrival 158/88.  Daughter states she has been having some issues with diarrhea since her last visit.  Daughter states she believes mother may have a UTI.  Plans are to follow-up with PCP.  Past Medical History:   Diagnosis Date  . Anemia    Hemoglobin of 11.6 in 01/2010; 12.1 in 08/2010  . Atrial fibrillation (HCC)    chronic requiring anticoagulation  . Chronic anticoagulation   . Chronic kidney disease    Stage III; creatinine of 1.7 in 04/2007 and 1.5 2009; 1.7 in 04/2010  . Hyperlipidemia    Total cholesterol of 226, triglycerides of 90, HDL of 49 and LDL of 159 in 01/2010  . Hypertension     Past Surgical History:  Procedure Laterality Date  . APPENDECTOMY    . BUNIONECTOMY  1995  . PARTIAL HYSTERECTOMY      Current Outpatient Medications  Medication Sig Dispense Refill  . donepezil (ARICEPT) 10 MG tablet Take 1 tablet by mouth once daily with breakfast 90 tablet 2  . famotidine (PEPCID) 40 MG tablet Take 0.5 tablets (20 mg total) by mouth 2 (two) times daily. 30 tablet 11  . hydrOXYzine (ATARAX/VISTARIL) 10 MG tablet Take 1 tablet (10 mg total) by mouth 3 (three) times daily as needed for itching.    Marland Kitchen LORazepam (ATIVAN) 1 MG tablet TAKE 1 TABLET BY MOUTH AT BEDTIME AS NEEDED FOR ANXIETY AND AGITATION 30 tablet 4  . metoprolol tartrate (LOPRESSOR) 50 MG tablet Take 1 tablet (50 mg total) by mouth 2 (two) times daily. 180 tablet 3  . omeprazole (PRILOSEC) 40 MG capsule Take 1 capsule by mouth once daily 90 capsule 3  . pravastatin (PRAVACHOL) 10 MG tablet Take 1 tablet (10 mg total) by mouth daily. 90 tablet 3  . warfarin (COUMADIN)  2.5 MG tablet TAKE AS DIRECTED BY COUMADIN CLINIC 30 tablet 1  . warfarin (COUMADIN) 4 MG tablet TAKE 1 TABLET (4 MG) BY MOUTH ONCE DAILY EXCEPT 1.5 TABLETS (6 MG) ON  MONDAY  WEDNESDAY  AND  FRIDAY 40 tablet 3  . amLODipine (NORVASC) 10 MG tablet Take 1 tablet (10 mg total) by mouth daily. 90 tablet 1   No current facility-administered medications for this visit.   Allergies:  Patient has no known allergies.   Social History: The patient  reports that she has never smoked. She has never used smokeless tobacco. She reports that she does not drink alcohol  and does not use drugs.   Family History: The patient's family history includes Diabetes in her brother, mother, and sister; Heart disease in her father.   ROS:  Please see the history of present illness. Otherwise, complete review of systems is positive for none.  All other systems are reviewed and negative.   Physical Exam: VS:  BP (!) 158/88   Pulse 67   Ht 5\' 4"  (1.626 m)   Wt 104 lb (47.2 kg)   SpO2 98%   BMI 17.85 kg/m , BMI Body mass index is 17.85 kg/m.  Wt Readings from Last 3 Encounters:  04/21/20 104 lb (47.2 kg)  03/20/20 102 lb 12.8 oz (46.6 kg)  07/15/18 150 lb (68 kg)    General: Patient appears comfortable at rest. Neck: Supple, no elevated JVP or carotid bruits, no thyromegaly. Lungs: Clear to auscultation, nonlabored breathing at rest. Cardiac: Irregularly irregular rate and rhythm, no S3 or significant systolic murmur, no pericardial rub. Extremities: No pitting edema, distal pulses 2+. Skin: Warm and dry. Musculoskeletal: No kyphosis. Neuropsychiatric: Alert and oriented x3, affect grossly appropriate.  ECG:  An ECG dated 03/20/2020 was personally reviewed today and demonstrated:  Atrial fibrillation with RVR rate of 131  Recent Labwork: 05/22/2019: ALT 3; AST 12; BUN 26; Creat 1.84; Hemoglobin 11.1; Platelets 252; Potassium 3.9; Sodium 140     Component Value Date/Time   CHOL 173 09/17/2016 1156   TRIG 101 09/17/2016 1156   HDL 52 09/17/2016 1156   CHOLHDL 3.3 09/17/2016 1156   VLDL 20 09/17/2016 1156   LDLCALC 101 (H) 09/17/2016 1156    Other Studies Reviewed Today:   Assessment and Plan:   1.  Permanent atrial fibrillation (HCC) Heart rate today is 67 and irregularly irregular.  She denies any shortness of breath, chest pain, dizziness.  She is not very active on a daily basis and is very sedentary.  Continue meds metoprolol 50 mg p.o. twice daily.  Continue Coumadin as directed by Coumadin clinic.  2. Essential hypertension Blood pressure  continues to be elevated today in spite of starting amlodipine at last visit.  Increase amlodipine to 10 mg p.o. daily.  Continue metoprolol 50 mg p.o. twice daily.  3. Mixed hyperlipidemia Continue pravastatin 10 mg p.o. daily.  Medication Adjustments/Labs and Tests Ordered: Current medicines are reviewed at length with the patient today.  Concerns regarding medicines are outlined above.   Disposition: Follow-up with Dr. 09/19/2016 or APP 6 months Signed, Wyline Mood, NP 04/21/2020 2:55 PM    [facility] at [facility] [address] [place], [place], [place] Phone: [phone]; Fax: [phone]

## 2020-04-21 ENCOUNTER — Ambulatory Visit (INDEPENDENT_AMBULATORY_CARE_PROVIDER_SITE_OTHER): Payer: Medicare Other | Admitting: Family Medicine

## 2020-04-21 ENCOUNTER — Ambulatory Visit (INDEPENDENT_AMBULATORY_CARE_PROVIDER_SITE_OTHER): Payer: Medicare Other | Admitting: *Deleted

## 2020-04-21 ENCOUNTER — Encounter: Payer: Self-pay | Admitting: Family Medicine

## 2020-04-21 VITALS — BP 158/88 | HR 67 | Ht 64.0 in | Wt 104.0 lb

## 2020-04-21 DIAGNOSIS — I749 Embolism and thrombosis of unspecified artery: Secondary | ICD-10-CM | POA: Diagnosis not present

## 2020-04-21 DIAGNOSIS — I10 Essential (primary) hypertension: Secondary | ICD-10-CM | POA: Diagnosis not present

## 2020-04-21 DIAGNOSIS — Z5181 Encounter for therapeutic drug level monitoring: Secondary | ICD-10-CM

## 2020-04-21 DIAGNOSIS — E782 Mixed hyperlipidemia: Secondary | ICD-10-CM | POA: Diagnosis not present

## 2020-04-21 DIAGNOSIS — I4821 Permanent atrial fibrillation: Secondary | ICD-10-CM

## 2020-04-21 LAB — POCT INR
INR: 3 (ref 2.0–3.0)
INR: 3 (ref 2.0–3.0)

## 2020-04-21 MED ORDER — AMLODIPINE BESYLATE 10 MG PO TABS: 10.0000 mg | ORAL_TABLET | Freq: Every day | ORAL | 1 refills | Status: DC

## 2020-04-21 NOTE — Patient Instructions (Signed)
Continue warfarin 4mg  daily except 6mg  on Mondays, Wednesdays and Fridays Recheck in 6 weeks

## 2020-04-21 NOTE — Patient Instructions (Signed)
Your physician wants you to follow-up in: 6 MONTHS WITH MD You will receive a reminder letter in the mail two months in advance. If you don't receive a letter, please call our office to schedule the follow-up appointment.  Your physician has recommended you make the following change in your medication:   INCREASE AMLODIPINE 10 MG DAILY   Thank you for choosing [facility]!!

## 2020-04-23 ENCOUNTER — Ambulatory Visit (INDEPENDENT_AMBULATORY_CARE_PROVIDER_SITE_OTHER): Payer: Medicare Other | Admitting: Nurse Practitioner

## 2020-04-23 ENCOUNTER — Other Ambulatory Visit: Payer: Self-pay

## 2020-04-23 VITALS — BP 148/80 | HR 62 | Temp 97.5°F | Wt 104.0 lb

## 2020-04-23 DIAGNOSIS — Z23 Encounter for immunization: Secondary | ICD-10-CM | POA: Diagnosis not present

## 2020-04-23 DIAGNOSIS — R238 Other skin changes: Secondary | ICD-10-CM | POA: Insufficient documentation

## 2020-04-23 DIAGNOSIS — R410 Disorientation, unspecified: Secondary | ICD-10-CM

## 2020-04-23 DIAGNOSIS — L299 Pruritus, unspecified: Secondary | ICD-10-CM | POA: Diagnosis not present

## 2020-04-23 DIAGNOSIS — R197 Diarrhea, unspecified: Secondary | ICD-10-CM | POA: Diagnosis not present

## 2020-04-23 DIAGNOSIS — F0281 Dementia in other diseases classified elsewhere with behavioral disturbance: Secondary | ICD-10-CM | POA: Diagnosis not present

## 2020-04-23 DIAGNOSIS — G309 Alzheimer's disease, unspecified: Secondary | ICD-10-CM

## 2020-04-23 MED ORDER — HYDROXYZINE HCL 10 MG PO TABS: 10.0000 mg | ORAL_TABLET | Freq: Three times a day (TID) | ORAL | 0 refills | Status: AC | PRN

## 2020-04-23 NOTE — Assessment & Plan Note (Signed)
Chronic, ongoing.  Unclear etiology although may be idiopathic or related to skin care.  Encouraged daily bathing with gentle cleanser and application of emollient after bathing.  To use topical ointment sparingly.  Also okay to continue to use hydroxyzine up to three times daily as needed to help with itch; refill given.

## 2020-04-23 NOTE — Assessment & Plan Note (Addendum)
Acute, ongoing.  Likely related to wearing adult briefs.  Instructed daughter on changing adult briefs every 2 hours or more frequently if soiled.  Gently, clean open area on buttocks with mild soap and water.  Apply vaseline/Desitin over open area to protect it from further damage.  Notify clinic if area starts oozing, draining, or becoming malodorous.

## 2020-04-23 NOTE — Progress Notes (Signed)
Subjective:    Patient ID: [name], female    DOB: [birth year], 86 y.o.   MRN: [id number]  HPI: [name] is a 86 y.o. female presenting with daughter, Gatha Mayer, for multiple complaints.   Chief Complaint  Patient presents with   Follow-up    Need to cleared from uti per home health. Not able to give urine   DIARRHEA Was treated with cephalexin in January - this was the last antibiotic she was on.  When she eats food, it seems to go right through her.  Duration: 2 weeks Diarrhea: soft poop, non-bloody   Episodes of diarrhea/day: twice-three times Description of diarrhea: soft poop Nausea: no Vomiting: no Episodes of vomit/day: 0 Description of vomiting: 0 Abdominal pain: yes; daily Fever: no Decreased appetite: no Loss of taste or smell: no Tolerating liquids: no Foreign travel: no; stays at home Relevant dietary history: none pertinent; eats fish, eggs, bacon, sausage, pancakes, chinese Similar illness in contacts: no Recent antibiotic use: no Status: stable Treatments attempted: Pepto-Bismol  SKIN INFECTION Daughter is worried about open area on her skin in between her buttocks.  She also reports the patient has had ongoing itching for years that has not gotten any better with the cream. Duration: months Location: mid gluteal fold History of trauma in area: no Pain: yes Quality: mild when she uses the bathroom Severity: unable to tell Redness: no Swelling: no Oozing: no Pus: no Fevers: no Nausea/vomiting: no Status: stable Treatments attempted: antibiotic ointment, vaseline  Tetanus: Not UTD   UPPER RESPIRATORY TRACT INFECTION Onset: weeks ago Worst symptom: nasal congestion Fever: no Cough: no Shortness of breath: no Wheezing: no Chest pain: no Chest tightness: no Chest congestion: no Nasal congestion: yes Runny nose: yes Post nasal drip: no Sneezing: no Sore throat: no Swollen glands: no Sinus pressure: no Headache: yes Face pain:  no Toothache: no Ear pain: no  Ear pressure: no  Eyes red/itching:no Eye drainage/crusting: no  Nausea: no Vomiting: no Diarrhea: yes Change in appetite: no Loss of taste/smell: no Rash: no Fatigue: yes Sick contacts: yes; son was with cold recently Strep contacts: no  Context: stable Recurrent sinusitis: no Treatments attempted: nothing Relief with OTC medications: n/a  No Known Allergies  Outpatient Encounter Medications as of 04/23/2020  Medication Sig   amLODipine (NORVASC) 10 MG tablet Take 1 tablet (10 mg total) by mouth daily.   donepezil (ARICEPT) 10 MG tablet Take 1 tablet by mouth once daily with breakfast   famotidine (PEPCID) 40 MG tablet Take 0.5 tablets (20 mg total) by mouth 2 (two) times daily.   LORazepam (ATIVAN) 1 MG tablet TAKE 1 TABLET BY MOUTH AT BEDTIME AS NEEDED FOR ANXIETY AND AGITATION   metoprolol tartrate (LOPRESSOR) 50 MG tablet Take 1 tablet (50 mg total) by mouth 2 (two) times daily.   omeprazole (PRILOSEC) 40 MG capsule Take 1 capsule by mouth once daily   pravastatin (PRAVACHOL) 10 MG tablet Take 1 tablet (10 mg total) by mouth daily.   warfarin (COUMADIN) 2.5 MG tablet TAKE AS DIRECTED BY COUMADIN CLINIC   warfarin (COUMADIN) 4 MG tablet TAKE 1 TABLET (4 MG) BY MOUTH ONCE DAILY EXCEPT 1.5 TABLETS (6 MG) ON  MONDAY  WEDNESDAY  AND  FRIDAY   [DISCONTINUED] hydrOXYzine (ATARAX/VISTARIL) 10 MG tablet Take 1 tablet (10 mg total) by mouth 3 (three) times daily as needed for itching.   hydrOXYzine (ATARAX/VISTARIL) 10 MG tablet Take 1 tablet (10 mg total) by mouth 3 (  three) times daily as needed for itching.   No facility-administered encounter medications on file as of 04/23/2020.    Patient Active Problem List   Diagnosis Date Noted   Diarrhea 04/23/2020   Skin breakdown 04/23/2020   Cellulitis of right orbital region    Periorbital cellulitis of left eye 07/15/2018   CAP (community acquired pneumonia) 07/15/2018   Impaired  gait and mobility 05/24/2017   Weakness 04/28/2016   Loss of weight 10/22/2013   Encounter for therapeutic drug monitoring 06/04/2013   Dementia (HCC) 05/18/2013   UTI (urinary tract infection) 10/05/2012   Chronic insomnia 06/30/2012   Headache(784.0) 03/03/2012   Eosinophilia 11/27/2011   Pruritus 10/25/2011   Peripheral vascular disease (HCC) 09/29/2011   Chronic kidney disease    Tobacco abuse, in remission    Chronic anticoagulation    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   Hyperlipidemia 01/01/2010   Anemia 04/09/2009   Hypertension 04/09/2009   ATRIAL FIBRILLATION, CHRONIC 04/09/2009    Past Medical History:  Diagnosis Date   Anemia    Hemoglobin of 11.6 in 01/2010; 12.1 in 08/2010   Atrial fibrillation (HCC)    chronic requiring anticoagulation   Chronic anticoagulation    Chronic kidney disease    Stage III; creatinine of 1.7 in 04/2007 and 1.5 2009; 1.7 in 04/2010   Hyperlipidemia    Total cholesterol of 226, triglycerides of 90, HDL of 49 and LDL of 159 in 01/2010   Hypertension     Relevant past medical, surgical, family and social history reviewed and updated as indicated. Interim medical history since our last visit reviewed.  Review of Systems  Constitutional: Positive for fatigue. Negative for activity change, appetite change and fever.  HENT: Positive for congestion and rhinorrhea. Negative for ear discharge, ear pain, hearing loss, postnasal drip, sinus pressure, sinus pain, sneezing, sore throat and trouble swallowing.   Eyes: Negative.  Negative for pain, discharge, redness and itching.  Respiratory: Negative.  Negative for cough, chest tightness, shortness of breath and wheezing.   Cardiovascular: Negative.  Negative for chest pain.  Gastrointestinal: Positive for abdominal pain, diarrhea and nausea. Negative for blood in stool, constipation and vomiting.  Skin: Positive for wound. Negative for color change  and rash.  Neurological: Positive for headaches. Negative for dizziness and weakness.  Psychiatric/Behavioral: Positive for confusion (dementia at baseline; daughter reports is worse) and sleep disturbance (not sleeping well at night). Negative for behavioral problems and decreased concentration.    Per HPI unless specifically indicated above     Objective:    BP (!) 148/80    Pulse 62    Temp (!) 97.5 F (36.4 C)    Wt 104 lb (47.2 kg)    SpO2 100%    BMI 17.85 kg/m   Wt Readings from Last 3 Encounters:  04/23/20 104 lb (47.2 kg)  04/21/20 104 lb (47.2 kg)  03/20/20 102 lb 12.8 oz (46.6 kg)    Physical Exam Vitals and nursing note reviewed.  Constitutional:      General: She is not in acute distress.    Appearance: Normal appearance. She is not toxic-appearing.  HENT:     Head: Normocephalic and atraumatic.     Right Ear: External ear normal.     Left Ear: External ear normal.     Nose: Congestion and rhinorrhea present.     Mouth/Throat:     Mouth: Mucous membranes are moist.     Pharynx: Oropharynx is clear.  Eyes:  General: No scleral icterus.       Right eye: No discharge.        Left eye: No discharge.     Extraocular Movements: Extraocular movements intact.  Cardiovascular:     Heart sounds: Normal heart sounds. No murmur heard.   Pulmonary:     Effort: Pulmonary effort is normal. No respiratory distress.     Breath sounds: Decreased air movement present. No wheezing, rhonchi or rales.  Abdominal:     General: Abdomen is flat. Bowel sounds are normal. There is no distension.     Palpations: Abdomen is soft.     Tenderness: There is no abdominal tenderness.  Skin:    General: Skin is warm and dry.     Capillary Refill: Capillary refill takes less than 2 seconds.     Coloration: Skin is not jaundiced or pale.     Findings: No erythema.       Neurological:     Mental Status: She is alert. Mental status is at baseline.  Psychiatric:        Mood and  Affect: Mood normal.        Behavior: Behavior is not agitated or combative. Behavior is cooperative.        Cognition and Memory: Cognition is impaired. Memory is impaired.        Assessment & Plan:   Problem List Items Addressed This Visit      Nervous and Auditory   Dementia (HCC) - Primary    Chronic, ongoing.  Dementia may be progressing base on most recent caregiver's concerns about behavior changes.  Will check UA and chest x-ray to rule out infectious cause.  Otherwise, continue with pharmacological treatment as prescribed.  Continue with in-home caregivers.      Relevant Medications   hydrOXYzine (ATARAX/VISTARIL) 10 MG tablet     Musculoskeletal and Integument   Pruritus    Chronic, ongoing.  Unclear etiology although may be idiopathic or related to skin care.  Encouraged daily bathing with gentle cleanser and application of emollient after bathing.  To use topical ointment sparingly.  Also okay to continue to use hydroxyzine up to three times daily as needed to help with itch; refill given.      Relevant Medications   hydrOXYzine (ATARAX/VISTARIL) 10 MG tablet   Skin breakdown    Acute, ongoing.  Likely related to wearing adult briefs.  Instructed daughter on changing adult briefs every 2 hours or more frequently if soiled.  Gently, clean open area on buttocks with mild soap and water.  Apply vaseline/Desitin over open area to protect it from further damage.  Notify clinic if area starts oozing, draining, or becoming malodorous.        Other   Diarrhea    Acute, ongoing x ~2 weeks.  Was not recently on antibiotics - last prescribed in ~05/2019.  Will check stool culture, C. Diff, and O&P and treat based on results.  Discussed diet to help prevent diarrhea - handout given.        Relevant Orders   C. difficile GDH and Toxin A/B   Ova and parasite examination   Stool culture    Other Visit Diagnoses    Delirium       Relevant Orders   DG Chest 2 View   Need for  vaccination       Relevant Orders   Flu Vaccine QUAD High Dose(Fluad) (Completed)   Pneumococcal polysaccharide vaccine 23-valent greater than or equal to  2yo subcutaneous/IM (Completed)   Need for prophylactic vaccination and inoculation against influenza       Relevant Orders   Flu Vaccine QUAD High Dose(Fluad) (Completed)       Follow up plan: Return if symptoms worsen or fail to improve.

## 2020-04-23 NOTE — Assessment & Plan Note (Signed)
Chronic, ongoing.  Dementia may be progressing base on most recent caregiver's concerns about behavior changes.  Will check UA and chest x-ray to rule out infectious cause.  Otherwise, continue with pharmacological treatment as prescribed.  Continue with in-home caregivers.

## 2020-04-23 NOTE — Patient Instructions (Addendum)
The green bottle needs to be frozen once collected, just 1 tablespoon of stool is good.  The other two bottles both have liquid in them - this liquid needs to stay in.  Please fill with stool to the red lines.   Diarrhea, Adult Diarrhea is when you pass loose and watery poop (stool) often. Diarrhea can make you feel weak and cause you to lose water in your body (get dehydrated). Losing water in your body can cause you to:  Feel tired and thirsty.  Have a dry mouth.  Go pee (urinate) less often. Diarrhea often lasts 2-3 days. However, it can last longer if it is a sign of something more serious. It is important to treat your diarrhea as told by your doctor. Follow these instructions at home: Eating and drinking     Follow these instructions as told by your doctor:  Take an ORS (oral rehydration solution). This is a drink that helps you replace fluids and minerals your body lost. It is sold at pharmacies and stores.  Drink plenty of fluids, such as: ? Water. ? Ice chips. ? Diluted fruit juice. ? Low-calorie sports drinks. ? Milk, if you want.  Avoid drinking fluids that have a lot of sugar or caffeine in them.  Eat bland, easy-to-digest foods in small amounts as you are able. These foods include: ? Bananas. ? Applesauce. ? Rice. ? Low-fat (lean) meats. ? Toast. ? Crackers.  Avoid alcohol.  Avoid spicy or fatty foods.  Medicines  Take over-the-counter and prescription medicines only as told by your doctor.  If you were prescribed an antibiotic medicine, take it as told by your doctor. Do not stop using the antibiotic even if you start to feel better. General instructions   Wash your hands often using soap and water. If soap and water are not available, use a hand sanitizer. Others in your home should wash their hands as well. Hands should be washed: ? After using the toilet or changing a diaper. ? Before preparing, cooking, or serving food. ? While caring for a sick  person. ? While visiting someone in a hospital.  Drink enough fluid to keep your pee (urine) pale yellow.  Rest at home while you get better.  Watch your condition for any changes.  Take a warm bath to help with any burning or pain from having diarrhea.  Keep all follow-up visits as told by your doctor. This is important. Contact a doctor if:  You have a fever.  Your diarrhea gets worse.  You have new symptoms.  You cannot keep fluids down.  You feel light-headed or dizzy.  You have a headache.  You have muscle cramps. Get help right away if:  You have chest pain.  You feel very weak or you pass out (faint).  You have bloody or black poop or poop that looks like tar.  You have very bad pain, cramping, or bloating in your belly (abdomen).  You have trouble breathing or you are breathing very quickly.  Your heart is beating very quickly.  Your skin feels cold and clammy.  You feel confused.  You have signs of losing too much water in your body, such as: ? Dark pee, very little pee, or no pee. ? Cracked lips. ? Dry mouth. ? Sunken eyes. ? Sleepiness. ? Weakness. Summary  Diarrhea is when you pass loose and watery poop (stool) often.  Diarrhea can make you feel weak and cause you to lose water in your body (  get dehydrated).  Take an ORS (oral rehydration solution). This is a drink that is sold at pharmacies and stores.  Eat bland, easy-to-digest foods in small amounts as you are able.  Contact a doctor if your condition gets worse. Get help right away if you have signs that you have lost too much water in your body. This information is not intended to replace advice given to you by your health care provider. Make sure you discuss any questions you have with your health care provider. Document Revised: 09/23/2017 Document Reviewed: 09/23/2017 Elsevier Patient Education  2020 [employer].

## 2020-04-23 NOTE — Assessment & Plan Note (Signed)
Acute, ongoing x ~2 weeks.  Was not recently on antibiotics - last prescribed in ~05/2019.  Will check stool culture, C. Diff, and O&P and treat based on results.  Discussed diet to help prevent diarrhea - handout given.

## 2020-05-07 ENCOUNTER — Other Ambulatory Visit: Payer: Self-pay | Admitting: Family Medicine

## 2020-05-07 DIAGNOSIS — F02818 Dementia in other diseases classified elsewhere, unspecified severity, with other behavioral disturbance: Secondary | ICD-10-CM

## 2020-05-07 DIAGNOSIS — F0281 Dementia in other diseases classified elsewhere with behavioral disturbance: Secondary | ICD-10-CM

## 2020-05-07 DIAGNOSIS — Z760 Encounter for issue of repeat prescription: Secondary | ICD-10-CM

## 2020-05-07 NOTE — Telephone Encounter (Signed)
Ok to refill??  Last office visit 04/23/2020.  Last refill 05/22/2019, #4 refills.

## 2020-05-08 ENCOUNTER — Telehealth: Payer: Self-pay

## 2020-05-08 NOTE — Telephone Encounter (Signed)
Voicemail was received from pt's [facility] Social Worker, [name], requesting a homebound COVID vaccination. Called Ms [name] back at [phone] as requested to do a pre-screening and coordinate a vaccine. Voice message left for her to call us back on the Homebound Hotline.

## 2020-05-08 NOTE — Telephone Encounter (Signed)
I connected by phone with [name] (daughter) regarding [name] on 05/08/2020 at 3:25 PM to discuss the potential vaccination through our Homebound vaccination initiative.   Prevaccination Checklist for COVID-19 Vaccines  1.  Are you feeling sick today? no  2.  Have you ever received a dose of a COVID-19 vaccine?  no      If yes, which one? None   How many dose of Covid-19 vaccine have your received and dates ? ___________   Check all that apply: I live in a long-term care setting. no  I have been diagnosed with a medical condition(s). Please list: Alzheimer's, bedbound (pertinent to homebound status)  I am a first responder. no  I work in a long-term care facility, correctional facility, hospital, restaurant, retail setting, school, or other setting with high exposure to the public. no  4. Do you have a health condition or are you undergoing treatment that makes you moderately or severely immunocompromised? (This would include treatment for cancer or HIV, receipt of organ transplant, immunosuppressive therapy or high-dose corticosteroids, CAR-T-cell therapy, hematopoietic cell transplant [HCT], DiGeorge syndrome or Wiskott-Aldrich syndrome)  no  5. Have you received hematopoietic cell transplant (HCT) or CAR-T-cell therapies since receiving COVID-19 vaccine? no  6.  Have you ever had an allergic reaction: (This would include a severe reaction [ e.g., anaphylaxis] that required treatment with epinephrine or EpiPen or that caused you to go to the hospital.  It would also include an allergic reaction that occurred within 4 hours that caused hives, swelling, or respiratory distress, including wheezing.) A.  A previous dose of COVID-19 vaccine. no  B.  A vaccine or injectable therapy that contains multiple components, one of which is a COVID-19 vaccine component, but it is not known which component elicited the immediate reaction. no  C.  Are you allergic to polyethylene glycol? no  D.  Are you allergic to Polysorbate, which is found in some vaccines, film coated tablets and intravenous steroids?  no   7.  Have you ever had an allergic reaction to another vaccine (other than COVID-19 vaccine) or an injectable medication? (This would include a severe reaction [ e.g., anaphylaxis] that required treatment with epinephrine or EpiPen or that caused you to go to the hospital.  It would also include an allergic reaction that occurred within 4 hours that caused hives, swelling, or respiratory distress, including wheezing.)  no   8.  Have you ever had a severe allergic reaction (e.g., anaphylaxis) to something other than a component of the COVID-19 vaccine, or any vaccine or injectable medication?  This would include food, pet, venom, environmental, or oral medication allergies.  no   Check all that apply to you:  Am a female between ages 42 and 6 years old  no  Women 2 through 85 years of age can receive any FDA-authorized or -approved COVID-19 vaccine. However, they should be informed of the rare but increased risk of thrombosis with thrombocytopenia syndrome (TTS) after receipt of the [employer] Vaccine and the availability of other FDA-authorized and -approved COVID-19 vaccines. People who had TTS after a first dose of Janssen vaccine should not receive a subsequent dose of Janssen product    Am a female between ages 32 and 25 years old  no Males 5 through 85 years of age may receive the correct formulation of Pfizer-BioNTech COVID-19 vaccine. Males 18 and older can receive any FDA-authorized or -approved vaccine. However, people receiving an mRNA COVID-19 vaccine, especially males  12 through 85 years of age and their parents/legal representative (when relevant), should be informed of the risk of developing myocarditis (an inflammation of the heart muscle) or pericarditis (inflammation of the lining around the heart) after receipt of an mRNA vaccine. The risk of developing either  myocarditis or pericarditis after vaccination is low, and lower than the risk of myocarditis associated with SARS-CoV-2 infection in adolescents and adults. Vaccine recipients should be counseled about the need to seek care if symptoms of myocarditis or pericarditis develop after vaccination     Have a history of myocarditis or pericarditis  no Myocarditis or pericarditis after receipt of the first dose of an mRNA COVID-19 vaccine series but before administration of the second dose  Experts advise that people who develop myocarditis or pericarditis after a dose of an mRNA COVID-19 vaccine not receive a subsequent dose of any COVID-19 vaccine, until additional safety data are available.  Administration of a subsequent dose of COVID-19 vaccine before safety data are available can be considered in certain circumstances after the episode of myocarditis or pericarditis has completely resolved. Until additional data are available, some experts recommend a [employer] COVID-19 vaccine be considered instead of an mRNA COVID-19 vaccine. Decisions about proceeding with a subsequent dose should include a conversation between the patient, their parent/legal representative (when relevant), and their clinical team, which may include a cardiologist.    Have been treated with monoclonal antibodies or convalescent serum to prevent or treat COVID-19  no Vaccination should be offered to people regardless of history of prior symptomatic or asymptomatic SARS-CoV-2 infection. There is no recommended minimal interval between infection and vaccination.  However, vaccination should be deferred if a patient received monoclonal antibodies or convalescent serum as treatment for COVID-19 or for post-exposure prophylaxis. This is a precautionary measure until additional information becomes available, to avoid interference of the antibody treatment with vaccine-induced immune responses.  Defer COVID-19 vaccination for 30 days when a  passive antibody product was used for post-exposure prophylaxis.  Defer COVID-19 vaccination for 90 days when a passive antibody product was used to treat COVID-19.     Diagnosed with Multisystem Inflammatory Syndrome (MIS-C or MIS-A) after a COVID-19 infection  no It is unknown if people with a history of MIS-C or MIS-A are at risk for a dysregulated immune response to COVID-19 vaccination.  People with a history of MIS-C or MIS-A may choose to be vaccinated. Considerations for vaccination may include:   Clinical recovery from MIS-C or MIS-A, including return to normal cardiac function   Personal risk of severe acute COVID-19 (e.g., age, underlying conditions)   High or substantial community transmission of SARS-CoV-2 and personal increased risk of reinfection.   Timing of any immunomodulatory therapies (general best practice guidelines for immunization can be consulted for more information [employer])   It has been 90 days or more since their diagnosis of MIS-C   Onset of MIS-C occurred before any COVID-19 vaccination   A conversation between the patient, their guardian(s), and their clinical team or a specialist may assist with COVID-19 vaccination decisions. Healthcare providers and health departments may also request a consultation from the Clinical Immunization Safety Assessment Project at [employer] [email or web address]/[email or web address]/[email or web address].     Have a bleeding disorder  no Take a blood thinner  yes, Coumadin As with all vaccines, any COVID-19 vaccine product may be given to these patients, if a physician familiar with the patient's bleeding risk determines that the vaccine can be administered intramuscularly with reasonable safety.  ACIP  recommends the following technique for intramuscular vaccination in patients with bleeding disorders or taking blood thinners: a fine-gauge needle (23-gauge or smaller caliber) should  be used for the vaccination, followed by firm pressure on the site, without rubbing, for at least 2 minutes.  People who regularly take aspirin or anticoagulants as part of their routine medications do not need to stop these medications prior to receipt of any COVID-19 vaccine.    Have a history of heparin-induced thrombocytopenia (HIT)  no Although the etiology of TTS associated with the [employer] COVID-19 vaccine is unclear, it appears to be similar to another rare immune-mediated syndrome, heparin-induced thrombocytopenia (HIT). People with a history of an episode of an immune-mediated syndrome characterized by thrombosis and thrombocytopenia, such as HIT, should be offered a currently FDA-approved or FDA-authorized mRNA COVID-19 vaccine if it has been ?90 days since their TTS resolved. After 90 days, patients may be vaccinated with any currently FDA-approved or FDA-authorized COVID-19 vaccine, including Janssen COVID-19 Vaccine. However, people who developed TTS after their initial Linwood Dibbles vaccine should not receive a Janssen booster dose.  Experts believe the following factors do not make people more susceptible to TTS after receipt of the [employer]. People with these conditions can be vaccinated with any FDA-authorized or - approved COVID-19 vaccine, including the [employer] COVID-19 Vaccine:   A prior history of venous thromboembolism   Risk factors for venous thromboembolism (e.g., inherited or acquired thrombophilia including Factor V Leiden; prothrombin gene 20210A mutation; antiphospholipid syndrome; protein C, protein S or antithrombin deficiency   A prior history of other types of thromboses not associated with thrombocytopenia   Pregnancy, post-partum status, or receipt of hormonal contraceptives (e.g., combined oral contraceptives, patch, ring)   Additional recipient education materials can be found at [employer] vaccines/safety/JJUpdate.html.    Am  currently pregnant or breastfeeding  no Vaccination is recommended for all people aged 21 years and older, including people that are:   Pregnant   Breastfeeding   Trying to get pregnant now or who might become pregnant in the future   Pregnant, breastfeeding, and post-partum people 64 through 85 years of age should be aware of the rare risk of TTS after receipt of the [employer] COVID-19 Vaccine and the availability of other FDA-authorized or -approved COVID-19 vaccines (i.e., mRNA vaccines).    Have received dermal fillers  no FDA-authorized or -approved COVID-19 vaccines can be administered to people who have received injectable dermal fillers who have no contraindications for vaccination.  Infrequently, these people might experience temporary swelling at or near the site of filler injection (usually the face or lips) following administration of a dose of an mRNA COVID-19 vaccine. These people should be advised to contact their healthcare provider if swelling develops at or near the site of dermal filler following vaccination.     Have a history of Guillain-Barr Syndrome (GBS)  no People with a history of GBS can receive any FDA-authorized or -approved COVID-19 vaccine. However, given the possible association between the [employer] and an increased risk of GBS, a patient with a history of GBS and their clinical team should discuss the availability of mRNA vaccines to offer protection against COVID-19. The highest risk has been observed in men aged 50-64 years with symptoms of GBS beginning within 42 days after Lin[employer] D[employer] COVID-19 vaccination.  People who had GBS after receiving Janssen vaccine should be made aware of the option to receive an mRNA COVID-19 vaccine booster at least 2 months (8 weeks) after the  Janssen dose. However, Lin[employer] D[employer] vaccine may be used as a booster, particularly if GBS occurred more than 42 days after vaccination or was related to a non-vaccine factor. Prior to  booster vaccination, a conversation between the patient and their clinical team may assist with decisions about use of a COVID-19 booster dose, including the timing of administration     Postvaccination Observation Times for People without Contraindications to Covid 19 Vaccination.  30 minutes:  People with a history of: A contraindication to another type of COVID-19 vaccine product (i.e., mRNA or viral vector COVID-19 vaccines)   Immediate (within 4 hours of exposure) non-severe allergic reaction to a COVID-19 vaccine or injectable therapies   Anaphylaxis due to any cause   Immediate allergic reaction of any severity to a non-COVID-19 vaccine   15 minutes: All other people  This patient is a 87 y.o. female that meets the FDA criteria to receive homebound vaccination. Patient or parent/caregiver understands they have the option to accept or refuse homebound vaccination.  Patient passed the pre-screening checklist and would like to proceed with homebound vaccination.  Based on questionnaire above, I recommend the patient be observed for 15 minutes.  There are an estimated #1 possible other household members/caregivers who are also interested in receiving the vaccine (daughter [name] if she's available).    The patient has been confirmed homebound and eligible for homebound vaccination with the considerations outlined above. I will send the patient's information to our scheduling team who will reach out to schedule the patient and potential caregiver/family members for homebound vaccination.    Sidney Ace 05/08/2020 3:47 PM

## 2020-05-08 NOTE — Telephone Encounter (Signed)
Tried to contact pt's daughter, Larey Seat, several times on numbers given by social worker [phone]) to complete homebound pre-vaccine assessment. Could not leave voicemail.

## 2020-05-08 NOTE — Telephone Encounter (Signed)
Return telephone call from [facility] SW Jacklynn Lewis. Pt qualifies for Homebound COVID vaccine program. Will follow up with pt's PCG/daughter Renessa to finish pre-vaccination screening. Renessa's # [phone] (cell) or [phone] (home).

## 2020-06-30 DIAGNOSIS — H6123 Impacted cerumen, bilateral: Secondary | ICD-10-CM | POA: Diagnosis not present

## 2020-06-30 DIAGNOSIS — H838X3 Other specified diseases of inner ear, bilateral: Secondary | ICD-10-CM | POA: Diagnosis not present

## 2020-06-30 DIAGNOSIS — H903 Sensorineural hearing loss, bilateral: Secondary | ICD-10-CM | POA: Diagnosis not present

## 2020-07-01 ENCOUNTER — Ambulatory Visit: Payer: Medicare Other | Attending: Critical Care Medicine

## 2020-07-01 DIAGNOSIS — Z23 Encounter for immunization: Secondary | ICD-10-CM

## 2020-07-01 NOTE — Progress Notes (Signed)
   Covid-19 Vaccination Clinic  Name:  MASSIAH LONGANECKER    MRN: [id number] DOB: 08-04-1928  07/01/2020  Ms. Briski was observed post Covid-19 immunization for 15 MIN without incident. She was provided with Vaccine Information Sheet and instruction to access the V-Safe system.   Ms. Ciccone was instructed to call 911 with any severe reactions post vaccine: Marland Kitchen Difficulty breathing  . Swelling of face and throat  . A fast heartbeat  . A bad rash all over body  . Dizziness and weakness   Immunizations Administered    Name Date Dose VIS Date Route   Moderna COVID-19 Vaccine 07/01/2020 12:05 PM 0.5 mL 02/20/2020 Intramuscular   Manufacturer: Moderna   Lot: [id number]   NDC: 50388-828-00

## 2020-07-15 ENCOUNTER — Telehealth: Payer: Self-pay

## 2020-07-15 NOTE — Telephone Encounter (Signed)
Per Dr Dallas Schimke [facility] will not take this patient as a new patient

## 2020-07-17 ENCOUNTER — Telehealth: Payer: Self-pay | Admitting: Family

## 2020-07-17 ENCOUNTER — Encounter: Payer: Self-pay | Admitting: Family

## 2020-07-17 ENCOUNTER — Other Ambulatory Visit: Payer: Self-pay

## 2020-07-17 ENCOUNTER — Ambulatory Visit (INDEPENDENT_AMBULATORY_CARE_PROVIDER_SITE_OTHER): Payer: Medicare Other | Admitting: Family

## 2020-07-17 VITALS — BP 140/80 | HR 60 | Temp 97.7°F | Resp 20 | Ht 64.0 in

## 2020-07-17 DIAGNOSIS — I739 Peripheral vascular disease, unspecified: Secondary | ICD-10-CM

## 2020-07-17 DIAGNOSIS — F0281 Dementia in other diseases classified elsewhere with behavioral disturbance: Secondary | ICD-10-CM

## 2020-07-17 DIAGNOSIS — I482 Chronic atrial fibrillation, unspecified: Secondary | ICD-10-CM

## 2020-07-17 DIAGNOSIS — L97319 Non-pressure chronic ulcer of right ankle with unspecified severity: Secondary | ICD-10-CM

## 2020-07-17 DIAGNOSIS — F5104 Psychophysiologic insomnia: Secondary | ICD-10-CM

## 2020-07-17 DIAGNOSIS — L03115 Cellulitis of right lower limb: Secondary | ICD-10-CM

## 2020-07-17 DIAGNOSIS — I10 Essential (primary) hypertension: Secondary | ICD-10-CM | POA: Diagnosis not present

## 2020-07-17 DIAGNOSIS — R2689 Other abnormalities of gait and mobility: Secondary | ICD-10-CM

## 2020-07-17 DIAGNOSIS — G301 Alzheimer's disease with late onset: Secondary | ICD-10-CM | POA: Diagnosis not present

## 2020-07-17 DIAGNOSIS — E782 Mixed hyperlipidemia: Secondary | ICD-10-CM | POA: Diagnosis not present

## 2020-07-17 MED ORDER — SACCHAROMYCES BOULARDII 250 MG PO CAPS: 250.0000 mg | ORAL_CAPSULE | Freq: Two times a day (BID) | ORAL | 0 refills | Status: AC

## 2020-07-17 MED ORDER — DOXYCYCLINE HYCLATE 100 MG PO TABS: 100.0000 mg | ORAL_TABLET | Freq: Two times a day (BID) | ORAL | 0 refills | Status: AC

## 2020-07-17 MED ORDER — ACETAMINOPHEN 500 MG PO TABS: 500.0000 mg | ORAL_TABLET | Freq: Three times a day (TID) | ORAL | 4 refills | Status: AC

## 2020-07-17 MED ORDER — TRAZODONE HCL 50 MG PO TABS: 25.0000 mg | ORAL_TABLET | Freq: Every evening | ORAL | 3 refills | Status: AC | PRN
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
## 2020-07-17 NOTE — Telephone Encounter (Signed)
Please notify patient's cardiologist office / coumadin clinic to recheck patient's  INR soon patient started on antibiotics ( 07/17/2020 ) Doxycycline 100 mg tablet twice daily x 7 days for right ankle ulcer.

## 2020-07-17 NOTE — Patient Instructions (Addendum)
-   Home Health Nurse cleanse right ankle ulcer with saline,pat dry,apply santyl to debride ,cover with foam dressing for protection and absorption and wrap leg from base of toes to knee high with Kerlix and ACE wrap.change dressing every three days until healed.notify provider for signs of infections or worsening symptoms.  - Home health Please draw Fasting Labs : CBC/diff,CMP,TSH level and Lipid panel.  - Take Trazodone 25-50 mg tablet one by mouth at bedtime.   - Take Extra strength tylenol 500 mg tablet one by mouth every 8 hours for right ankle pain   - Notify coumadin clinic to recheck INR sooner while on antibiotics.

## 2020-07-18 NOTE — Telephone Encounter (Signed)
Called pt and spoke to pt's daughter. Made her aware that doxy can increase pt's INR. Asked if pt could come in Monday but due to transportation appointment scheduled for 3/24. Recommended pt eat some extra serving of greens while she was on the antibiotic and to seek medical attention if she notices any bleeding.

## 2020-07-18 NOTE — Telephone Encounter (Signed)
Message routed back to NP Dinah Ngetich.

## 2020-07-18 NOTE — Telephone Encounter (Signed)
Noted  

## 2020-07-24 ENCOUNTER — Ambulatory Visit (INDEPENDENT_AMBULATORY_CARE_PROVIDER_SITE_OTHER): Payer: Medicare Other | Admitting: Pharmacist

## 2020-07-24 ENCOUNTER — Other Ambulatory Visit (HOSPITAL_COMMUNITY)
Admission: RE | Admit: 2020-07-24 | Discharge: 2020-07-24 | Disposition: A | Discharge status: Home / Self Care | Payer: Medicare Other | Attending: Family | Admitting: Family

## 2020-07-24 DIAGNOSIS — I749 Embolism and thrombosis of unspecified artery: Secondary | ICD-10-CM

## 2020-07-24 DIAGNOSIS — N179 Acute kidney failure, unspecified: Secondary | ICD-10-CM | POA: Diagnosis not present

## 2020-07-24 DIAGNOSIS — Z515 Encounter for palliative care: Secondary | ICD-10-CM | POA: Diagnosis not present

## 2020-07-24 DIAGNOSIS — Z66 Do not resuscitate: Secondary | ICD-10-CM | POA: Diagnosis not present

## 2020-07-24 DIAGNOSIS — R4182 Altered mental status, unspecified: Secondary | ICD-10-CM | POA: Diagnosis not present

## 2020-07-24 DIAGNOSIS — J90 Pleural effusion, not elsewhere classified: Secondary | ICD-10-CM | POA: Diagnosis not present

## 2020-07-24 DIAGNOSIS — D62 Acute posthemorrhagic anemia: Secondary | ICD-10-CM | POA: Diagnosis not present

## 2020-07-24 DIAGNOSIS — L03115 Cellulitis of right lower limb: Secondary | ICD-10-CM | POA: Insufficient documentation

## 2020-07-24 DIAGNOSIS — Z5181 Encounter for therapeutic drug level monitoring: Secondary | ICD-10-CM

## 2020-07-24 DIAGNOSIS — R790 Abnormal level of blood mineral: Secondary | ICD-10-CM | POA: Diagnosis not present

## 2020-07-24 DIAGNOSIS — I6340 Cerebral infarction due to embolism of unspecified cerebral artery: Secondary | ICD-10-CM | POA: Diagnosis not present

## 2020-07-24 DIAGNOSIS — Z20822 Contact with and (suspected) exposure to covid-19: Secondary | ICD-10-CM | POA: Diagnosis not present

## 2020-07-24 DIAGNOSIS — G9341 Metabolic encephalopathy: Secondary | ICD-10-CM | POA: Diagnosis not present

## 2020-07-24 DIAGNOSIS — I4811 Longstanding persistent atrial fibrillation: Secondary | ICD-10-CM

## 2020-07-24 DIAGNOSIS — D6832 Hemorrhagic disorder due to extrinsic circulating anticoagulants: Secondary | ICD-10-CM | POA: Diagnosis not present

## 2020-07-24 DIAGNOSIS — D649 Anemia, unspecified: Secondary | ICD-10-CM | POA: Diagnosis not present

## 2020-07-24 LAB — CBC WITH DIFFERENTIAL/PLATELET
Abs Immature Granulocytes: 0.04 10*3/uL (ref 0.00–0.07)
Basophils Absolute: 0 10*3/uL (ref 0.0–0.1)
Basophils Relative: 0 %
Eosinophils Absolute: 0 10*3/uL (ref 0.0–0.5)
Eosinophils Relative: 0 %
HCT: 15.8 % — ABNORMAL LOW (ref 36.0–46.0)
Hemoglobin: 5 g/dL — CL (ref 12.0–15.0)
Immature Granulocytes: 1 %
Lymphocytes Relative: 13 %
Lymphs Abs: 1.1 10*3/uL (ref 0.7–4.0)
MCH: 31.4 pg (ref 26.0–34.0)
MCHC: 31.6 g/dL (ref 30.0–36.0)
MCV: 99.4 fL (ref 80.0–100.0)
Monocytes Absolute: 0.6 10*3/uL (ref 0.1–1.0)
Monocytes Relative: 7 %
Neutro Abs: 6.9 10*3/uL (ref 1.7–7.7)
Neutrophils Relative %: 79 %
Platelets: 289 10*3/uL (ref 150–400)
RBC: 1.59 MIL/uL — ABNORMAL LOW (ref 3.87–5.11)
RDW: 13.5 % (ref 11.5–15.5)
WBC: 8.6 10*3/uL (ref 4.0–10.5)
nRBC: 0 % (ref 0.0–0.2)

## 2020-07-24 LAB — COMPREHENSIVE METABOLIC PANEL
ALT: 7 U/L (ref 0–44)
AST: 13 U/L — ABNORMAL LOW (ref 15–41)
Albumin: 2 g/dL — ABNORMAL LOW (ref 3.5–5.0)
Alkaline Phosphatase: 52 U/L (ref 38–126)
Anion gap: 9 (ref 5–15)
BUN: 58 mg/dL — ABNORMAL HIGH (ref 8–23)
CO2: 13 mmol/L — ABNORMAL LOW (ref 22–32)
Calcium: 7.4 mg/dL — ABNORMAL LOW (ref 8.9–10.3)
Chloride: 113 mmol/L — ABNORMAL HIGH (ref 98–111)
Creatinine, Ser: 3.7 mg/dL — ABNORMAL HIGH (ref 0.44–1.00)
GFR, Estimated: 11 mL/min — ABNORMAL LOW (ref >60)
Glucose, Bld: 106 mg/dL — ABNORMAL HIGH (ref 70–99)
Potassium: 4.1 mmol/L (ref 3.5–5.1)
Sodium: 135 mmol/L (ref 135–145)
Total Bilirubin: 0.3 mg/dL (ref 0.3–1.2)
Total Protein: 5.1 g/dL — ABNORMAL LOW (ref 6.5–8.1)

## 2020-07-24 LAB — TSH: TSH: 1.831 u[IU]/mL (ref 0.350–4.500)

## 2020-07-24 LAB — POCT INR: INR: 8 — AB (ref 2.0–3.0)

## 2020-07-24 NOTE — Patient Instructions (Addendum)
Description   Patient currently being transported to [facility].  EMS called by Silva Bandy at [facility]  [place] [phone]

## 2020-07-25 ENCOUNTER — Encounter (HOSPITAL_COMMUNITY): Payer: Self-pay | Admitting: Emergency Medicine

## 2020-07-25 ENCOUNTER — Inpatient Hospital Stay (HOSPITAL_COMMUNITY): Payer: Medicare Other

## 2020-07-25 ENCOUNTER — Other Ambulatory Visit: Payer: Self-pay

## 2020-07-25 ENCOUNTER — Emergency Department (HOSPITAL_COMMUNITY): Payer: Medicare Other

## 2020-07-25 ENCOUNTER — Inpatient Hospital Stay (HOSPITAL_COMMUNITY)
Admission: EM | Admit: 2020-07-25 | Discharge: 2020-08-01 | DRG: 813 | Disposition: E | Payer: Medicare Other | Attending: Internal Medicine | Admitting: Internal Medicine

## 2020-07-25 DIAGNOSIS — R791 Abnormal coagulation profile: Secondary | ICD-10-CM | POA: Diagnosis not present

## 2020-07-25 DIAGNOSIS — Z681 Body mass index (BMI) 19 or less, adult: Secondary | ICD-10-CM | POA: Diagnosis not present

## 2020-07-25 DIAGNOSIS — E785 Hyperlipidemia, unspecified: Secondary | ICD-10-CM | POA: Diagnosis present

## 2020-07-25 DIAGNOSIS — N189 Chronic kidney disease, unspecified: Secondary | ICD-10-CM

## 2020-07-25 DIAGNOSIS — L89899 Pressure ulcer of other site, unspecified stage: Secondary | ICD-10-CM | POA: Diagnosis present

## 2020-07-25 DIAGNOSIS — G9341 Metabolic encephalopathy: Secondary | ICD-10-CM | POA: Diagnosis not present

## 2020-07-25 DIAGNOSIS — I482 Chronic atrial fibrillation, unspecified: Secondary | ICD-10-CM | POA: Diagnosis not present

## 2020-07-25 DIAGNOSIS — I10 Essential (primary) hypertension: Secondary | ICD-10-CM | POA: Diagnosis not present

## 2020-07-25 DIAGNOSIS — R404 Transient alteration of awareness: Secondary | ICD-10-CM | POA: Diagnosis not present

## 2020-07-25 DIAGNOSIS — D62 Acute posthemorrhagic anemia: Secondary | ICD-10-CM | POA: Diagnosis not present

## 2020-07-25 DIAGNOSIS — I639 Cerebral infarction, unspecified: Secondary | ICD-10-CM

## 2020-07-25 DIAGNOSIS — R32 Unspecified urinary incontinence: Secondary | ICD-10-CM | POA: Diagnosis present

## 2020-07-25 DIAGNOSIS — E538 Deficiency of other specified B group vitamins: Secondary | ICD-10-CM | POA: Diagnosis present

## 2020-07-25 DIAGNOSIS — D689 Coagulation defect, unspecified: Secondary | ICD-10-CM | POA: Diagnosis not present

## 2020-07-25 DIAGNOSIS — J90 Pleural effusion, not elsewhere classified: Secondary | ICD-10-CM | POA: Diagnosis not present

## 2020-07-25 DIAGNOSIS — Z8249 Family history of ischemic heart disease and other diseases of the circulatory system: Secondary | ICD-10-CM | POA: Diagnosis not present

## 2020-07-25 DIAGNOSIS — R29810 Facial weakness: Secondary | ICD-10-CM | POA: Diagnosis not present

## 2020-07-25 DIAGNOSIS — Z20822 Contact with and (suspected) exposure to covid-19: Secondary | ICD-10-CM | POA: Diagnosis not present

## 2020-07-25 DIAGNOSIS — W228XXA Striking against or struck by other objects, initial encounter: Secondary | ICD-10-CM | POA: Diagnosis present

## 2020-07-25 DIAGNOSIS — N179 Acute kidney failure, unspecified: Secondary | ICD-10-CM

## 2020-07-25 DIAGNOSIS — H50111 Monocular exotropia, right eye: Secondary | ICD-10-CM | POA: Diagnosis not present

## 2020-07-25 DIAGNOSIS — Z7401 Bed confinement status: Secondary | ICD-10-CM

## 2020-07-25 DIAGNOSIS — G40909 Epilepsy, unspecified, not intractable, without status epilepticus: Secondary | ICD-10-CM | POA: Diagnosis present

## 2020-07-25 DIAGNOSIS — J984 Other disorders of lung: Secondary | ICD-10-CM | POA: Diagnosis not present

## 2020-07-25 DIAGNOSIS — R627 Adult failure to thrive: Secondary | ICD-10-CM | POA: Diagnosis present

## 2020-07-25 DIAGNOSIS — D72823 Leukemoid reaction: Secondary | ICD-10-CM | POA: Diagnosis present

## 2020-07-25 DIAGNOSIS — D6832 Hemorrhagic disorder due to extrinsic circulating anticoagulants: Principal | ICD-10-CM | POA: Diagnosis present

## 2020-07-25 DIAGNOSIS — F0390 Unspecified dementia without behavioral disturbance: Secondary | ICD-10-CM | POA: Diagnosis present

## 2020-07-25 DIAGNOSIS — Z90711 Acquired absence of uterus with remaining cervical stump: Secondary | ICD-10-CM

## 2020-07-25 DIAGNOSIS — Z515 Encounter for palliative care: Secondary | ICD-10-CM | POA: Diagnosis not present

## 2020-07-25 DIAGNOSIS — I6523 Occlusion and stenosis of bilateral carotid arteries: Secondary | ICD-10-CM | POA: Diagnosis not present

## 2020-07-25 DIAGNOSIS — D649 Anemia, unspecified: Secondary | ICD-10-CM | POA: Diagnosis not present

## 2020-07-25 DIAGNOSIS — N1832 Chronic kidney disease, stage 3b: Secondary | ICD-10-CM

## 2020-07-25 DIAGNOSIS — R0902 Hypoxemia: Secondary | ICD-10-CM | POA: Diagnosis not present

## 2020-07-25 DIAGNOSIS — R64 Cachexia: Secondary | ICD-10-CM | POA: Diagnosis present

## 2020-07-25 DIAGNOSIS — J432 Centrilobular emphysema: Secondary | ICD-10-CM | POA: Diagnosis not present

## 2020-07-25 DIAGNOSIS — R29818 Other symptoms and signs involving the nervous system: Secondary | ICD-10-CM | POA: Diagnosis not present

## 2020-07-25 DIAGNOSIS — S20211A Contusion of right front wall of thorax, initial encounter: Secondary | ICD-10-CM | POA: Diagnosis present

## 2020-07-25 DIAGNOSIS — I6389 Other cerebral infarction: Secondary | ICD-10-CM | POA: Diagnosis not present

## 2020-07-25 DIAGNOSIS — Z993 Dependence on wheelchair: Secondary | ICD-10-CM

## 2020-07-25 DIAGNOSIS — I700 Atherosclerosis of aorta: Secondary | ICD-10-CM | POA: Diagnosis not present

## 2020-07-25 DIAGNOSIS — E876 Hypokalemia: Secondary | ICD-10-CM | POA: Diagnosis not present

## 2020-07-25 DIAGNOSIS — E872 Acidosis: Secondary | ICD-10-CM | POA: Diagnosis present

## 2020-07-25 DIAGNOSIS — R790 Abnormal level of blood mineral: Secondary | ICD-10-CM | POA: Diagnosis not present

## 2020-07-25 DIAGNOSIS — K5730 Diverticulosis of large intestine without perforation or abscess without bleeding: Secondary | ICD-10-CM | POA: Diagnosis not present

## 2020-07-25 DIAGNOSIS — I129 Hypertensive chronic kidney disease with stage 1 through stage 4 chronic kidney disease, or unspecified chronic kidney disease: Secondary | ICD-10-CM | POA: Diagnosis present

## 2020-07-25 DIAGNOSIS — Z79899 Other long term (current) drug therapy: Secondary | ICD-10-CM | POA: Diagnosis not present

## 2020-07-25 DIAGNOSIS — Z7901 Long term (current) use of anticoagulants: Secondary | ICD-10-CM | POA: Diagnosis not present

## 2020-07-25 DIAGNOSIS — R2689 Other abnormalities of gait and mobility: Secondary | ICD-10-CM | POA: Diagnosis not present

## 2020-07-25 DIAGNOSIS — F17201 Nicotine dependence, unspecified, in remission: Secondary | ICD-10-CM

## 2020-07-25 DIAGNOSIS — I739 Peripheral vascular disease, unspecified: Secondary | ICD-10-CM | POA: Diagnosis present

## 2020-07-25 DIAGNOSIS — R680 Hypothermia, not associated with low environmental temperature: Secondary | ICD-10-CM | POA: Diagnosis present

## 2020-07-25 DIAGNOSIS — Z66 Do not resuscitate: Secondary | ICD-10-CM | POA: Diagnosis not present

## 2020-07-25 DIAGNOSIS — I4891 Unspecified atrial fibrillation: Secondary | ICD-10-CM | POA: Diagnosis present

## 2020-07-25 DIAGNOSIS — Z833 Family history of diabetes mellitus: Secondary | ICD-10-CM

## 2020-07-25 DIAGNOSIS — E869 Volume depletion, unspecified: Secondary | ICD-10-CM | POA: Diagnosis present

## 2020-07-25 DIAGNOSIS — D539 Nutritional anemia, unspecified: Secondary | ICD-10-CM | POA: Diagnosis present

## 2020-07-25 DIAGNOSIS — I2510 Atherosclerotic heart disease of native coronary artery without angina pectoris: Secondary | ICD-10-CM | POA: Diagnosis not present

## 2020-07-25 DIAGNOSIS — Z7189 Other specified counseling: Secondary | ICD-10-CM | POA: Diagnosis not present

## 2020-07-25 DIAGNOSIS — Z720 Tobacco use: Secondary | ICD-10-CM | POA: Diagnosis not present

## 2020-07-25 DIAGNOSIS — R4182 Altered mental status, unspecified: Secondary | ICD-10-CM | POA: Diagnosis not present

## 2020-07-25 DIAGNOSIS — I6340 Cerebral infarction due to embolism of unspecified cerebral artery: Secondary | ICD-10-CM | POA: Diagnosis not present

## 2020-07-25 DIAGNOSIS — M47816 Spondylosis without myelopathy or radiculopathy, lumbar region: Secondary | ICD-10-CM | POA: Diagnosis not present

## 2020-07-25 DIAGNOSIS — T45515A Adverse effect of anticoagulants, initial encounter: Secondary | ICD-10-CM | POA: Diagnosis present

## 2020-07-25 DIAGNOSIS — L89510 Pressure ulcer of right ankle, unstageable: Secondary | ICD-10-CM | POA: Diagnosis present

## 2020-07-25 DIAGNOSIS — E782 Mixed hyperlipidemia: Secondary | ICD-10-CM | POA: Diagnosis not present

## 2020-07-25 DIAGNOSIS — S22080A Wedge compression fracture of T11-T12 vertebra, initial encounter for closed fracture: Secondary | ICD-10-CM | POA: Diagnosis not present

## 2020-07-25 DIAGNOSIS — G4710 Hypersomnia, unspecified: Secondary | ICD-10-CM | POA: Diagnosis present

## 2020-07-25 LAB — COMPREHENSIVE METABOLIC PANEL
ALT: 9 U/L (ref 0–44)
AST: 21 U/L (ref 15–41)
Albumin: 2.2 g/dL — ABNORMAL LOW (ref 3.5–5.0)
Alkaline Phosphatase: 59 U/L (ref 38–126)
Anion gap: 16 — ABNORMAL HIGH (ref 5–15)
BUN: 69 mg/dL — ABNORMAL HIGH (ref 8–23)
CO2: 8 mmol/L — ABNORMAL LOW (ref 22–32)
Calcium: 8 mg/dL — ABNORMAL LOW (ref 8.9–10.3)
Chloride: 114 mmol/L — ABNORMAL HIGH (ref 98–111)
Creatinine, Ser: 4.67 mg/dL — ABNORMAL HIGH (ref 0.44–1.00)
GFR, Estimated: 8 mL/min — ABNORMAL LOW (ref >60)
Glucose, Bld: 120 mg/dL — ABNORMAL HIGH (ref 70–99)
Potassium: 5 mmol/L (ref 3.5–5.1)
Sodium: 138 mmol/L (ref 135–145)
Total Bilirubin: 0.4 mg/dL (ref 0.3–1.2)
Total Protein: 5.4 g/dL — ABNORMAL LOW (ref 6.5–8.1)

## 2020-07-25 LAB — PROCALCITONIN: Procalcitonin: 0.37 ng/mL

## 2020-07-25 LAB — CBC WITH DIFFERENTIAL/PLATELET
Abs Immature Granulocytes: 0.2 10*3/uL — ABNORMAL HIGH (ref 0.00–0.07)
Basophils Absolute: 0 10*3/uL (ref 0.0–0.1)
Basophils Relative: 0 %
Eosinophils Absolute: 0 10*3/uL (ref 0.0–0.5)
Eosinophils Relative: 0 %
HCT: 14.1 % — ABNORMAL LOW (ref 36.0–46.0)
Hemoglobin: 4 g/dL — CL (ref 12.0–15.0)
Immature Granulocytes: 2 %
Lymphocytes Relative: 11 %
Lymphs Abs: 1.5 10*3/uL (ref 0.7–4.0)
MCH: 31.7 pg (ref 26.0–34.0)
MCHC: 28.4 g/dL — ABNORMAL LOW (ref 30.0–36.0)
MCV: 111.9 fL — ABNORMAL HIGH (ref 80.0–100.0)
Monocytes Absolute: 0.8 10*3/uL (ref 0.1–1.0)
Monocytes Relative: 6 %
Neutro Abs: 11 10*3/uL — ABNORMAL HIGH (ref 1.7–7.7)
Neutrophils Relative %: 81 %
Platelets: 334 10*3/uL (ref 150–400)
RBC: 1.26 MIL/uL — ABNORMAL LOW (ref 3.87–5.11)
RDW: 14.1 % (ref 11.5–15.5)
WBC: 13.5 10*3/uL — ABNORMAL HIGH (ref 4.0–10.5)
nRBC: 0 % (ref 0.0–0.2)

## 2020-07-25 LAB — URINALYSIS, ROUTINE W REFLEX MICROSCOPIC
Bilirubin Urine: NEGATIVE
Glucose, UA: NEGATIVE mg/dL
Ketones, ur: NEGATIVE mg/dL
Nitrite: NEGATIVE
Protein, ur: >=300 mg/dL — AB
Specific Gravity, Urine: 1.015 (ref 1.005–1.030)
pH: 5 (ref 5.0–8.0)

## 2020-07-25 LAB — POC OCCULT BLOOD, ED: Negative: NEGATIVE

## 2020-07-25 LAB — PROTIME-INR
INR: 9.5 (ref 0.8–1.2)
Prothrombin Time: 74.4 seconds — ABNORMAL HIGH (ref 11.4–15.2)

## 2020-07-25 LAB — MRSA PCR SCREENING: MRSA by PCR: NEGATIVE

## 2020-07-25 LAB — CBG MONITORING, ED: Glucose-Capillary: 106 mg/dL — ABNORMAL HIGH (ref 70–99)

## 2020-07-25 LAB — POC SARS CORONAVIRUS 2 AG -  ED: SARS Coronavirus 2 Ag: NEGATIVE

## 2020-07-25 LAB — PREPARE RBC (CROSSMATCH)

## 2020-07-25 MED ORDER — METOPROLOL TARTRATE 50 MG PO TABS
50.0000 mg | ORAL_TABLET | Freq: Two times a day (BID) | ORAL | Status: DC
  Filled 2020-07-25: qty 1

## 2020-07-25 MED ORDER — SODIUM BICARBONATE 8.4 % IV SOLN
INTRAVENOUS | Status: DC
  Filled 2020-07-25 (×8): qty 850

## 2020-07-25 MED ORDER — TRAZODONE HCL 50 MG PO TABS: 25.0000 mg | ORAL_TABLET | Freq: Every evening | ORAL | Status: DC | PRN

## 2020-07-25 MED ORDER — PRAVASTATIN SODIUM 10 MG PO TABS
10.0000 mg | ORAL_TABLET | Freq: Every day | ORAL | Status: DC
  Administered 2020-07-27 – 2020-07-28 (×2): 10 mg via ORAL
  Filled 2020-07-25 (×6): qty 1

## 2020-07-25 MED ORDER — LABETALOL HCL 5 MG/ML IV SOLN
5.0000 mg | Freq: Four times a day (QID) | INTRAVENOUS | Status: DC | PRN
  Administered 2020-07-25: 5 mg via INTRAVENOUS
  Filled 2020-07-25: qty 4

## 2020-07-25 MED ORDER — PANTOPRAZOLE SODIUM 40 MG IV SOLR
40.0000 mg | INTRAVENOUS | Status: DC
  Administered 2020-07-25 – 2020-07-30 (×6): 40 mg via INTRAVENOUS
  Filled 2020-07-25 (×6): qty 40

## 2020-07-25 MED ORDER — ACETAMINOPHEN 650 MG RE SUPP
650.0000 mg | Freq: Four times a day (QID) | RECTAL | Status: DC | PRN
Start: 2020-07-25 — End: 2020-07-31
  Filled 2020-07-25: qty 1

## 2020-07-25 MED ORDER — SODIUM BICARBONATE 8.4 % IV SOLN
INTRAVENOUS | Status: AC
  Administered 2020-07-25: 50 meq via INTRAVENOUS
  Filled 2020-07-25: qty 50

## 2020-07-25 MED ORDER — ONDANSETRON HCL 4 MG/2ML IJ SOLN: 4.0000 mg | Freq: Four times a day (QID) | INTRAMUSCULAR | Status: DC | PRN

## 2020-07-25 MED ORDER — SODIUM CHLORIDE 0.9 % IV BOLUS
500.0000 mL | Freq: Once | INTRAVENOUS | Status: AC
  Administered 2020-07-25: 500 mL via INTRAVENOUS

## 2020-07-25 MED ORDER — VITAMIN K1 10 MG/ML IJ SOLN
1.0000 mg | Freq: Once | INTRAVENOUS | Status: AC
  Administered 2020-07-25: 1 mg via INTRAVENOUS
  Filled 2020-07-25: qty 0.1

## 2020-07-25 MED ORDER — SODIUM CHLORIDE 0.9 % IV SOLN: 10.0000 mL/h | Freq: Once | INTRAVENOUS | Status: DC

## 2020-07-25 MED ORDER — ACETAMINOPHEN 325 MG PO TABS: 650.0000 mg | ORAL_TABLET | Freq: Four times a day (QID) | ORAL | Status: DC | PRN

## 2020-07-25 MED ORDER — VITAMIN K1 10 MG/ML IJ SOLN
5.0000 mg | Freq: Once | INTRAVENOUS | Status: AC
  Administered 2020-07-25: 5 mg via INTRAVENOUS
  Filled 2020-07-25: qty 0.5

## 2020-07-25 MED ORDER — SODIUM CHLORIDE 0.9 % IV SOLN
1.0000 g | INTRAVENOUS | Status: DC
  Administered 2020-07-25 – 2020-07-27 (×3): 1 g via INTRAVENOUS
  Filled 2020-07-25 (×4): qty 1

## 2020-07-25 MED ORDER — AMLODIPINE BESYLATE 5 MG PO TABS
10.0000 mg | ORAL_TABLET | Freq: Every day | ORAL | Status: DC
  Administered 2020-07-27 – 2020-07-28 (×2): 10 mg via ORAL
  Filled 2020-07-25 (×6): qty 2

## 2020-07-25 MED ORDER — DONEPEZIL HCL 5 MG PO TABS
10.0000 mg | ORAL_TABLET | Freq: Every day | ORAL | Status: DC
  Filled 2020-07-25: qty 2

## 2020-07-25 MED ORDER — ONDANSETRON HCL 4 MG PO TABS: 4.0000 mg | ORAL_TABLET | Freq: Four times a day (QID) | ORAL | Status: DC | PRN

## 2020-07-25 MED ORDER — SODIUM BICARBONATE 8.4 % IV SOLN: 50.0000 meq | Freq: Once | INTRAVENOUS | Status: AC

## 2020-07-25 MED ORDER — PANTOPRAZOLE SODIUM 40 MG PO TBEC: 40.0000 mg | DELAYED_RELEASE_TABLET | Freq: Every day | ORAL | Status: DC

## 2020-07-25 NOTE — ED Notes (Signed)
Pt belongings taken to room 1 icu

## 2020-07-25 NOTE — ED Notes (Signed)
Daughter at patients bedside.  Daughter signed consent for blood for patient states "if she needs it you need to give it."

## 2020-07-25 NOTE — ED Provider Notes (Signed)
[facility] EMERGENCY DEPARTMENT Provider Note   CSN: [id number] Arrival date & time: 07/27/2020  0935     History Chief Complaint  Patient presents with  . Abnormal Lab    [name] is a 87 y.o. female with PMH/o Afib (on coumadin), CKD, HLD, HTN, dementia brought in by EMS for evaluation of hemoglobin.  Home health nurse called EMS after patient was found to have a low hemoglobin.  EMS reports that family stated that patient does have a history of dementia and is often confused and "sleeps very heavily."  EM LEVEL 5 CAVEAT DUE TO AMS, DEMENTIA  The history is provided by the patient.       Past Medical History:  Diagnosis Date  . Anemia    Hemoglobin of 11.6 in 01/2010; 12.1 in 08/2010  . Atrial fibrillation (HCC)    chronic requiring anticoagulation  . Chronic anticoagulation   . Chronic kidney disease    Stage III; creatinine of 1.7 in 04/2007 and 1.5 2009; 1.7 in 04/2010  . Hyperlipidemia    Total cholesterol of 226, triglycerides of 90, HDL of 49 and LDL of 159 in 01/2010  . Hypertension     Patient Active Problem List   Diagnosis Date Noted  . Diarrhea 04/23/2020  . Skin breakdown 04/23/2020  . Cellulitis of right orbital region   . Periorbital cellulitis of left eye 07/15/2018  . CAP (community acquired pneumonia) 07/15/2018  . Impaired gait and mobility 05/24/2017  . Weakness 04/28/2016  . Loss of weight 10/22/2013  . Encounter for therapeutic drug monitoring 06/04/2013  . Dementia (HCC) 05/18/2013  . UTI (urinary tract infection) 10/05/2012  . Chronic insomnia 06/30/2012  . Headache(784.0) 03/03/2012  . Eosinophilia 11/27/2011  . Pruritus 10/25/2011  . Peripheral vascular disease (HCC) 09/29/2011  . Chronic kidney disease   . Tobacco abuse, in remission   . Chronic anticoagulation   . 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 Hyperlipidemia 01/01/2010  . Anemia 04/09/2009  . Hypertension 04/09/2009  . ATRIAL FIBRILLATION,  CHRONIC 04/09/2009    Past Surgical History:  Procedure Laterality Date  . APPENDECTOMY    . BUNIONECTOMY  1995  . PARTIAL HYSTERECTOMY       OB History   No obstetric history on file.     Family History  Problem Relation Age of Onset  . Diabetes Mother   . Heart disease Father   . Diabetes Sister   . Diabetes Brother     Social History   Tobacco Use  . Smoking status: Never Smoker  . Smokeless tobacco: Never Used  Vaping Use  . Vaping Use: Never used  Substance Use Topics  . Alcohol use: No    Alcohol/week: 0.0 standard drinks  . Drug use: No    Home Medications Prior to Admission medications   Medication Sig Start Date End Date Taking? Authorizing Provider  acetaminophen (TYLENOL) 500 MG tablet Take 1 tablet (500 mg total) by mouth every 8 (eight) hours. 07/17/20   Ngetich, Dinah C, NP  amLODipine (NORVASC) 10 MG tablet Take 10 mg by mouth daily.    [provider]  donepezil (ARICEPT) 10 MG tablet Take 1 tablet by mouth once daily with breakfast 05/07/20   [name], MD  doxycycline (VIBRA-TABS) 100 MG tablet Take 1 tablet (100 mg total) by mouth 2 (two) times daily. 07/17/20   Ngetich, Dinah C, NP  famotidine (PEPCID) 40 MG tablet Take 0.5 tablets (20 mg total) by  mouth 2 (two) times daily. 04/18/20   [name], MD  hydrOXYzine (ATARAX/VISTARIL) 10 MG tablet Take 1 tablet (10 mg total) by mouth 3 (three) times daily as needed for itching. 04/23/20   Valentino Nose, NP  LORazepam (ATIVAN) 1 MG tablet TAKE 1 TABLET BY MOUTH AT BEDTIME AS NEEDED FOR ANXIETY AND  AGITATION 05/07/20   [name], MD  metoprolol tartrate (LOPRESSOR) 50 MG tablet Take 50 mg by mouth 2 (two) times daily.    [provider]  omeprazole (PRILOSEC) 40 MG capsule Take 1 capsule by mouth once daily 05/07/20   [name], MD  pravastatin (PRAVACHOL) 10 MG tablet Take 1 tablet (10 mg total) by mouth daily. 05/22/19   Jordan, Velna Hatchet, MD   saccharomyces boulardii (FLORASTOR) 250 MG capsule Take 1 capsule (250 mg total) by mouth 2 (two) times daily. 07/17/20   Ngetich, Dinah C, NP  traZODone (DESYREL) 50 MG tablet Take 0.5-1 tablets (25-50 mg total) by mouth at bedtime as needed for sleep. 07/17/20   Ngetich, Dinah C, NP  warfarin (COUMADIN) 2.5 MG tablet TAKE AS DIRECTED BY COUMADIN CLINIC 12/10/19   Netta Neat., NP  warfarin (COUMADIN) 4 MG tablet Take 4 mg by mouth. Monday,Wednesday and Friday along with coumadin 2.5 mg tablet ( Total 6.5 mg Tablet ).    [provider]    Allergies    Patient has no known allergies.  Review of Systems   Review of Systems  Unable to perform ROS: Mental status change    Physical Exam Updated Vital Signs BP (!) 135/44   Pulse 76   Temp 98.1 F (36.7 C) (Rectal)   Resp 20   Ht 5\' 4"  (1.626 m)   Wt 47.2 kg   SpO2 94%   BMI 17.85 kg/m   Physical Exam Vitals and nursing note reviewed.  Constitutional:      Appearance: She is cachectic. She is ill-appearing.     Comments: Frail and elderly appearing. Lethargic but will open eyes and groan on verbal stimuli and with movement. Protecting her airway.   HENT:     Head: Normocephalic and atraumatic.  Eyes:     General: Lids are normal.     Conjunctiva/sclera: Conjunctivae normal.     Pupils: Pupils are equal, round, and reactive to light.     Comments: Pupils 4 mm bilaterally.   Cardiovascular:     Rate and Rhythm: Rhythm irregularly irregular.     Pulses: Normal pulses.     Heart sounds: Normal heart sounds. No murmur heard. No friction rub. No gallop.   Pulmonary:     Effort: Pulmonary effort is normal.     Breath sounds: Normal breath sounds.     Comments: Lungs clear to auscultation bilaterally.  Symmetric chest rise.  No wheezing, rales, rhonchi. Abdominal:     Palpations: Abdomen is soft. Abdomen is not rigid.     Tenderness: There is no abdominal tenderness. There is no guarding.     Comments: Abdomen is  soft, non-distended, non-tender. No rigidity, No guarding. No peritoneal signs.  Genitourinary:    Comments: The exam was performed with a chaperone present. Digital Rectal Exam reveals sphincter with good tone. No external hemorrhoids. No masses or fissures. Stool color is brown with no overt blood. Musculoskeletal:        General: Normal range of motion.     Cervical back: Full passive range of motion without pain.  Skin:  General: Skin is warm and dry.     Capillary Refill: Capillary refill takes less than 2 seconds.  Neurological:     Comments: Lethargic but arouseable to verbal stimuli and movement. Will say "I don't feel well." Does not follow commands.  Withdraws some from pain.   Psychiatric:     Comments: Unable to assess     ED Results / Procedures / Treatments   Labs (all labs ordered are listed, but only abnormal results are displayed) Labs Reviewed  COMPREHENSIVE METABOLIC PANEL - Abnormal; Notable for the following components:      Result Value   Chloride 114 (*)    CO2 8 (*)    Glucose, Bld 120 (*)    BUN 69 (*)    Creatinine, Ser 4.67 (*)    Calcium 8.0 (*)    Total Protein 5.4 (*)    Albumin 2.2 (*)    GFR, Estimated 8 (*)    Anion gap 16 (*)    All other components within normal limits  CBC WITH DIFFERENTIAL/PLATELET - Abnormal; Notable for the following components:   WBC 13.5 (*)    RBC 1.26 (*)    Hemoglobin 4.0 (*)    HCT 14.1 (*)    MCV 111.9 (*)    MCHC 28.4 (*)    Neutro Abs 11.0 (*)    Abs Immature Granulocytes 0.20 (*)    All other components within normal limits  PROTIME-INR - Abnormal; Notable for the following components:   Prothrombin Time 74.4 (*)    INR 9.5 (*)    All other components within normal limits  URINALYSIS, ROUTINE W REFLEX MICROSCOPIC - Abnormal; Notable for the following components:   Color, Urine AMBER (*)    APPearance CLOUDY (*)    Hgb urine dipstick SMALL (*)    Protein, ur >=300 (*)    Leukocytes,Ua TRACE (*)     Bacteria, UA FEW (*)    All other components within normal limits  CBG MONITORING, ED - Abnormal; Notable for the following components:   Glucose-Capillary 106 (*)    All other components within normal limits  POC OCCULT BLOOD, ED - Normal  POC SARS CORONAVIRUS 2 AG -  ED  TYPE AND SCREEN  PREPARE RBC (CROSSMATCH)    EKG None  Radiology CT Head Wo Contrast  Result Date: 07/04/2020 CLINICAL DATA:  Altered mental status EXAM: CT HEAD WITHOUT CONTRAST TECHNIQUE: Contiguous axial images were obtained from the base of the skull through the vertex without intravenous contrast. COMPARISON:  None. FINDINGS: Brain: No evidence of acute infarction, hemorrhage, hydrocephalus, extra-axial collection or mass lesion/mass effect. Advanced low-density changes within the periventricular and subcortical white matter compatible with chronic microvascular ischemic change. Moderate diffuse cerebral volume loss. Vascular: Atherosclerotic calcifications involving the large vessels of the skull base. No unexpected hyperdense vessel. Skull: Normal. Negative for fracture or focal lesion. Sinuses/Orbits: Complete opacification of the bilateral mastoid air cells. Right maxillary mucous retention cyst. Other: None. IMPRESSION: 1. No acute intracranial findings. 2. Advanced chronic microvascular ischemic change and cerebral volume loss. 3. Complete opacification of the bilateral mastoid air cells. Electronically Signed   By: Duanne GuessNicholas  Plundo D.O.   On: 07/05/2020 12:40   DG Chest Portable 1 View  Result Date: 07/01/2020 CLINICAL DATA:  Altered mental status EXAM: PORTABLE CHEST 1 VIEW COMPARISON:  Chest CT from July 15, 2018 FINDINGS: Limited rotated chest. Asymmetric density at the right base with costophrenic sulcus blunting. No edema or pneumothorax. Large volume  lungs in general. IMPRESSION: 1. Limited rotated chest. 2. Small right pleural effusion which could be chronic or related to scarring based on a 2020 chest  CT. Electronically Signed   By: Marnee Spring M.D.   On: Jul 27, 2020 10:48    Procedures .Critical Care Performed by: Maxwell Caul, PA-C Authorized by: Maxwell Caul, PA-C   Critical care provider statement:    Critical care time (minutes):  45   Critical care was necessary to treat or prevent imminent or life-threatening deterioration of the following conditions:  Circulatory failure   Critical care was time spent personally by me on the following activities:  Discussions with consultants, evaluation of patient's response to treatment, examination of patient, ordering and performing treatments and interventions, ordering and review of laboratory studies, ordering and review of radiographic studies, pulse oximetry, re-evaluation of patient's condition, obtaining history from patient or surrogate and review of old charts     Medications Ordered in ED Medications  0.9 %  sodium chloride infusion (has no administration in time range)  0.9 %  sodium chloride infusion (has no administration in time range)  phytonadione (VITAMIN K) 1 mg in dextrose 5 % 50 mL IVPB (1 mg Intravenous New Bag/Given 27-Jul-2020 1202)  sodium chloride 0.9 % bolus 500 mL (500 mLs Intravenous New Bag/Given 2020/07/27 1206)    ED Course  I have reviewed the triage vital signs and the nursing notes.  Pertinent labs & imaging results that were available during my care of the patient were reviewed by me and considered in my medical decision making (see chart for details).    MDM Rules/Calculators/A&P                          87 y.o. F with PMH/o Afib (on coumadin), HTN, Dementia BIB EMS for evaluation of low hgb. Patient with a history of dementia so poor history overall.  Per my review of the records, she is on Coumadin for her A. fib.  Yesterday, she had blood work drawn that showed a hemoglobin of 5, and INR of 8.  EMS reports that family said that patient "sleeps very heavily."  On initially arrival, patient is  lethargic.  She is arousable to verbal stimuli but does not follow commands.  Gag reflex intact.  Vitals stable.  She has a lot of muscle wasting and do not know if she has true facial asymmetry versus sunken cheeks.  She is not following commands I am unable to assess a neuro exam.  Unclear of when she was last normal and I am unable to reach family.  Since we do not have a last known normal we will not call code stroke at this time.  GU exam shows no obvious rectal bleeding.  Will obtain blood work, CT head, chest x-ray.  10:03 AM: I attempted to call patient's daughter both on her home phone and her cell phone but was unable to get in contact with them.   CBC shows Hgb of 4.0. CBC shows slight leukocytosis of 13.5. Fecal occult is negative. CBG is 106. INR is 9.5. Will transfuse and start Vitamin K. CMP shows bicarb of 8. BUN of 69, Cr of 4.67. this is slightly worse than her baseline. She is normally around 1.4-1.8. She was 3.70 yesterday. Anion gap is 16.    I attempted to call the family to obtain consent for blood transfusion. I was unable to get into contact with anybody to  discuss consent. I attempted to discuss with patient but she is not able to give her consent at this time.  Given concerns of her hemoglobin of 4 as well as her transient hypotension, feel that this is an emergent situation.  I reviewed her records.  She has had a transfusion previously.  At this time, we will emergently transfuse her given her continued hemoglobin drop and transient hypotension.   CXR shows small right pleural effusion.   Given concerns for anemia and AKI, will plan for admission.   Discussed patient with Dr. Arbutus Leas (hospitalist) who accepts patient for admission.   Portions of this note were generated with Scientist, clinical (histocompatibility and immunogenetics). Dictation errors may occur despite best attempts at proofreading.   Final Clinical Impression(s) / ED Diagnoses Final diagnoses:  Symptomatic anemia  AKI (acute kidney  injury) (HCC)  Elevated INR    Rx / DC Orders ED Discharge Orders    None       Rosana Hoes 07/24/2020 1316    Vanetta Mulders, MD 07/19/2020 1457

## 2020-07-25 NOTE — ED Notes (Signed)
Transfusion started

## 2020-07-25 NOTE — ED Notes (Signed)
Attempted to consent with family pt have emergent blood transfusion.  Unable to get in contact with family.  Pt deemed emergent transfusion.

## 2020-07-25 NOTE — H&P (Signed)
History and Physical  [name]:[id number] DOB: 10-10-1928 DOA: 07/19/2020   PCP: [name], NP   Patient coming from: Home  Chief Complaint: anemia  HPI:  [name] is a 87 y.o. female with medical history of dementia, CKD stage III, hypertension, hyperlipidemia, peripheral vascular disease, atrial fibrillation presenting with low hemoglobin.  Apparently, the patient had a routine blood work done on 07/24/2020 by home health.  She was noted to have a hemoglobin of 5.0.  As result, home health RN activated EMS.  Unfortunately, no family is available to provide any additional history.  Multiple attempts were made to contact the patient's family without success.  History is obtained from review of the medical record and speaking with the ED staff.  Apparently, the patient lives with her son who is the primary caretaker.  There was no given history of any distress, shortness of breath, pain, or active bleeding.  EMS reported that the patient has underlying dementia and "sleeps heavily". The patient herself is awake and alert and occasionally answers simple questions.  She denies any chest pain, shortness of breath, abdominal pain.  Remainder the review of systems is unobtainable secondary to patient's advanced dementia.  In the emergency department, the patient initially had some soft blood pressures with systolic BP in the 90s.  2 units PRBC were ordered, and the patient's blood pressure improved with blood transfusion.  The patient was otherwise afebrile with oxygen saturation 100% room air.  BMP shows sodium 138, potassium to 5.0, bicarbonate 8, BUN 69, creatinine 4.67.  LFTs were unremarkable.  WBC 13.5, hemoglobin 4.0, platelets 234,000.  INR 9.5. Vitamin K 1 mg IV and 500 cc bolus normal saline were given.    Assessment/Plan: Acute blood loss anemia -Per ED provider, FOBT was negative -Repeat FOBT with next bowel movement -2 units PRBC ordered -Give additional 1  milligram of vitamin K -Holding Coumadin  Acute on chronic renal failure--CKD stage IIIb -Secondary to hemodynamic changes and volume depletion -Baseline hemoglobin 1.5-1.8 -Presented with hemoglobin 4.67 -Renal ultrasound -patient is not a dialysis candidate  Coagulopathy/supratherapeutic INR -Give additional 1 mg warfarin -am INR -hold warfarin -start PPI  Metabolic acidosis -Start IV bicarbonate  Right ankle wound -see pictures -wound care consult  Leukemoid reaction -Check PCT -UA and urine culture -Personally reviewed chest x-ray--extremely rotated, question RLL effusion/infiltrate  Macrocytic anemia -Check B12 -Check folic acid  Essential hypertension -Holding amlodipine and metoprolol secondary to soft blood pressure  Dementia without behavioral disturbance -Continue Aricept  Hyperlipidemia -Continue statin  Chronic atrial fibrillation -Holding Coumadin secondary to coagulopathy      Past Medical History:  Diagnosis Date  . Anemia    Hemoglobin of 11.6 in 01/2010; 12.1 in 08/2010  . Atrial fibrillation (HCC)    chronic requiring anticoagulation  . Chronic anticoagulation   . Chronic kidney disease    Stage III; creatinine of 1.7 in 04/2007 and 1.5 2009; 1.7 in 04/2010  . Hyperlipidemia    Total cholesterol of 226, triglycerides of 90, HDL of 49 and LDL of 159 in 01/2010  . Hypertension    Past Surgical History:  Procedure Laterality Date  . APPENDECTOMY    . BUNIONECTOMY  1995  . PARTIAL HYSTERECTOMY     Social History:  reports that she has never smoked. She has never used smokeless tobacco. She reports that she does not drink alcohol and does not use drugs.   Family History  Problem Relation Age  of Onset  . Diabetes Mother   . Heart disease Father   . Diabetes Sister   . Diabetes Brother      No Known Allergies   Prior to Admission medications   Medication Sig Start Date End Date Taking? Authorizing Provider  acetaminophen  (TYLENOL) 500 MG tablet Take 1 tablet (500 mg total) by mouth every 8 (eight) hours. 07/17/20   Ngetich, Dinah C, NP  amLODipine (NORVASC) 10 MG tablet Take 10 mg by mouth daily.    [provider]  donepezil (ARICEPT) 10 MG tablet Take 1 tablet by mouth once daily with breakfast 05/07/20   [name], MD  doxycycline (VIBRA-TABS) 100 MG tablet Take 1 tablet (100 mg total) by mouth 2 (two) times daily. 07/17/20   Ngetich, Dinah C, NP  famotidine (PEPCID) 40 MG tablet Take 0.5 tablets (20 mg total) by mouth 2 (two) times daily. 04/18/20   [name], MD  hydrOXYzine (ATARAX/VISTARIL) 10 MG tablet Take 1 tablet (10 mg total) by mouth 3 (three) times daily as needed for itching. 04/23/20   Valentino Nose, NP  LORazepam (ATIVAN) 1 MG tablet TAKE 1 TABLET BY MOUTH AT BEDTIME AS NEEDED FOR ANXIETY AND  AGITATION 05/07/20   [name], MD  metoprolol tartrate (LOPRESSOR) 50 MG tablet Take 50 mg by mouth 2 (two) times daily.    [provider]  omeprazole (PRILOSEC) 40 MG capsule Take 1 capsule by mouth once daily 05/07/20   [name], MD  pravastatin (PRAVACHOL) 10 MG tablet Take 1 tablet (10 mg total) by mouth daily. 05/22/19   [place], Velna Hatchet, MD  saccharomyces boulardii (FLORASTOR) 250 MG capsule Take 1 capsule (250 mg total) by mouth 2 (two) times daily. 07/17/20   Ngetich, Dinah C, NP  traZODone (DESYREL) 50 MG tablet Take 0.5-1 tablets (25-50 mg total) by mouth at bedtime as needed for sleep. 07/17/20   Ngetich, Dinah C, NP  warfarin (COUMADIN) 2.5 MG tablet TAKE AS DIRECTED BY COUMADIN CLINIC 12/10/19   Netta Neat., NP  warfarin (COUMADIN) 4 MG tablet Take 4 mg by mouth. Monday,Wednesday and Friday along with coumadin 2.5 mg tablet ( Total 6.5 mg Tablet ).    [provider]    Review of Systems:  Unobtainable due to dementia Physical Exam: Vitals:   2020/07/27 1215 2020-07-27 1230 07-27-20 1252 2020/07/27 1306  BP: (!) 122/48 (!) 133/33 (!)  144/42 (!) 135/44  Pulse:  75 70 76  Resp: 19 (!) 25 (!) 25 20  Temp:   97.8 F (36.6 C) 98.1 F (36.7 C)  TempSrc:   Rectal Rectal  SpO2:  100% 100% 94%  Weight:      Height:       General:  A&O x 1, NAD, nontoxic, pleasant/cooperative Head/Eye: No conjunctival hemorrhage, no icterus, [place]/AT, No nystagmus ENT:  No icterus,  No thrush, good dentition, no pharyngeal exudate Neck:  No masses, no lymphadenpathy, no bruits CV:  RRR, no rub, no gallop, no S3 Lung:  Bibasilar crackles. No wheeze Abdomen: soft/NT, +BS, nondistended, no peritoneal signs Ext: No cyanosis, No rashes, No petechiae, No lymphangitis, No edema       Labs on Admission:  Basic Metabolic Panel: Recent Labs  Lab 07/24/20 1125 07/27/20 1024  NA 135 138  K 4.1 5.0  CL 113* 114*  CO2 13* 8*  GLUCOSE 106* 120*  BUN 58* 69*  CREATININE 3.70* 4.67*  CALCIUM 7.4* 8.0*  Liver Function Tests: Recent Labs  Lab 07/24/20 1125 2020/08/13 1024  AST 13* 21  ALT 7 9  ALKPHOS 52 59  BILITOT 0.3 0.4  PROT 5.1* 5.4*  ALBUMIN 2.0* 2.2*   No results for input(s): LIPASE, AMYLASE in the last 168 hours. No results for input(s): AMMONIA in the last 168 hours. CBC: Recent Labs  Lab 07/24/20 1125 08-13-2020 1024  WBC 8.6 13.5*  NEUTROABS 6.9 11.0*  HGB 5.0* 4.0*  HCT 15.8* 14.1*  MCV 99.4 111.9*  PLT 289 334   Coagulation Profile: Recent Labs  Lab 07/24/20 1140 2020/08/13 1024  INR 8.0* 9.5*   Cardiac Enzymes: No results for input(s): CKTOTAL, CKMB, CKMBINDEX, TROPONINI in the last 168 hours. BNP: Invalid input(s): POCBNP CBG: Recent Labs  Lab 08/13/2020 0945  GLUCAP 106*   Urine analysis:    Component Value Date/Time   COLORURINE AMBER (A) 2020-08-13 1039   APPEARANCEUR CLOUDY (A) 08/13/2020 1039   LABSPEC 1.015 August 13, 2020 1039   PHURINE 5.0 13-Aug-2020 1039   GLUCOSEU NEGATIVE August 13, 2020 1039   HGBUR SMALL (A) 13-Aug-2020 1039   BILIRUBINUR NEGATIVE 2020/08/13 1039   BILIRUBINUR neg  04/13/2012 1612   KETONESUR NEGATIVE 08/13/2020 1039   PROTEINUR >=300 (A) 2020/08/13 1039   UROBILINOGEN 0.2 10/04/2012 1514   NITRITE NEGATIVE August 13, 2020 1039   LEUKOCYTESUR TRACE (A) 08-13-2020 1039   Sepsis Labs: @LABRCNTIP (procalcitonin:4,lacticidven:4) )No results found for this or any previous visit (from the past 240 hour(s)).   Radiological Exams on Admission: CT Head Wo Contrast  Result Date: 13-Aug-2020 CLINICAL DATA:  Altered mental status EXAM: CT HEAD WITHOUT CONTRAST TECHNIQUE: Contiguous axial images were obtained from the base of the skull through the vertex without intravenous contrast. COMPARISON:  None. FINDINGS: Brain: No evidence of acute infarction, hemorrhage, hydrocephalus, extra-axial collection or mass lesion/mass effect. Advanced low-density changes within the periventricular and subcortical white matter compatible with chronic microvascular ischemic change. Moderate diffuse cerebral volume loss. Vascular: Atherosclerotic calcifications involving the large vessels of the skull base. No unexpected hyperdense vessel. Skull: Normal. Negative for fracture or focal lesion. Sinuses/Orbits: Complete opacification of the bilateral mastoid air cells. Right maxillary mucous retention cyst. Other: None. IMPRESSION: 1. No acute intracranial findings. 2. Advanced chronic microvascular ischemic change and cerebral volume loss. 3. Complete opacification of the bilateral mastoid air cells. Electronically Signed   By: 07/27/2020 D.O.   On: 08-13-2020 12:40   DG Chest Portable 1 View  Result Date: 08-13-20 CLINICAL DATA:  Altered mental status EXAM: PORTABLE CHEST 1 VIEW COMPARISON:  Chest CT from July 15, 2018 FINDINGS: Limited rotated chest. Asymmetric density at the right base with costophrenic sulcus blunting. No edema or pneumothorax. Large volume lungs in general. IMPRESSION: 1. Limited rotated chest. 2. Small right pleural effusion which could be chronic or related to  scarring based on a 2020 chest CT. Electronically Signed   By: July 17, 2018 M.D.   On: 2020/08/13 10:48        Time spent:60 minutes Code Status:   FULL Family Communication:  Attempted, left VM Disposition Plan: expect 2 day hospitalization Consults called: GI DVT Prophylaxis:  SCDs  07/27/2020, DO  [facility] Pager [phone]  If 7PM-7AM, please contact night-coverage [email or web address] Password [facility] 2020-08-13, 1:29 PM

## 2020-07-25 NOTE — ED Notes (Signed)
Pt to room 1 ICU

## 2020-07-25 NOTE — ED Triage Notes (Signed)
Pt from home with son. EMS was told home health nurse was told her hgb was low and to come to ED. Pt unable to answer questions appropriately.

## 2020-07-25 NOTE — ED Notes (Signed)
Unable to obtain consent to administer blood.  Was deemed emergent with a hemoglobin on 4.   Blood administration advised per emergent order.  Typer and Cross completed initially will transfusion blood that has been issued.

## 2020-07-25 NOTE — Progress Notes (Signed)
Addendum to H&P  The patient son and daughter arrived at the bedside. Therefore, I was able to gather additional history. At baseline, the patient is essentially bedbound/wheelchair bound.  She requires significant assistance for activities of daily living, but she is able to feed herself.  Interestingly, the patient son is the primary caregiver, but the patient's daughter provides much better history. Daughter states that the patient has not been complaining of any chest pain, shortness breath, vomiting, diarrhea, dysuria.  He has not noted any hematochezia, hematuria, hematemesis, epistaxis, vaginal bleeding.  she states that the patient had been recently started on an antibiotic for her leg wound.  There is been no other new medications.  Daughter states that about a week ago, the patient went to see her PCP for routine visit.  There were no able to obtain blood from venipuncture.  As result, home health nurse was sent to the home for INR check and routine blood work.  This was performed on 07/24/2020.  Once the CBC resulted (5.0), EMS was activated by [facility].  Family states that the patient has been eating fairly well at baseline.  Denies any recent trauma to the patient.  However, they suspected the patient has been trying to get out of bed and may have hit the side rails on her hospital bed.  Once the patient arrived to the floor, I was contacted by RN to see the patient secondary to a large hematoma noted on the patient's right thoracic cage area involving her right breast.  Interestingly, the patient's son who is a primary caregiver was not aware of this hematoma.  Also the patient was noted to have hypothermia with temperature of 93.2 F.  -CT chest has been ordered -CT abd/pelvis r/o retroperitoneal hematoma -blood culture x 2 -UA/Urine culture -Am cortisol -TSH 1.831 -start empiric cefepime -give additional vitamin K 5mg  IV  Goals of care discussed with family-->confirmed DNR  DTat

## 2020-07-25 NOTE — Consult Note (Signed)
WOC Nurse Consult Note: Patient receiving care in [facility] ED1. Chart reviewed, including photos of wound. Reason for Consult: right foot wound Wound type: right lateral malleolus unstageable wound Pressure Injury POA: Yes Measurement: To be provided by the bedside RN in the flowsheet section Wound bed: see photo images, dried, brownish red Drainage (amount, consistency, odor) To be provided by the bedside RN in the flowsheet section Periwound: intact Dressing procedure/placement/frequency: Wash right ankle wound with soap and water, pat dry. Place saline moistened gauze over the wound, then dry gauze, secure with kerlex.  I have also added prevalon heel lift boots bilaterally.  When the patient is in bed, she should have these on. Monitor the wound area(s) for worsening of condition such as: Signs/symptoms of infection,  Increase in size,  Development of or worsening of odor, Development of pain, or increased pain at the affected locations.  Notify the medical team if any of these develop.  Thank you for the consult. WOC nurse will not follow at this time.  Please re-consult the WOC team if needed.  Helmut Muster, RN, MSN, CWOCN, CNS-BC, pager [phone]

## 2020-07-26 DIAGNOSIS — Z7901 Long term (current) use of anticoagulants: Secondary | ICD-10-CM | POA: Diagnosis not present

## 2020-07-26 DIAGNOSIS — D649 Anemia, unspecified: Secondary | ICD-10-CM | POA: Diagnosis not present

## 2020-07-26 DIAGNOSIS — D689 Coagulation defect, unspecified: Secondary | ICD-10-CM | POA: Diagnosis not present

## 2020-07-26 DIAGNOSIS — D62 Acute posthemorrhagic anemia: Secondary | ICD-10-CM | POA: Diagnosis not present

## 2020-07-26 DIAGNOSIS — I639 Cerebral infarction, unspecified: Secondary | ICD-10-CM

## 2020-07-26 DIAGNOSIS — I482 Chronic atrial fibrillation, unspecified: Secondary | ICD-10-CM | POA: Diagnosis not present

## 2020-07-26 DIAGNOSIS — N179 Acute kidney failure, unspecified: Secondary | ICD-10-CM | POA: Diagnosis not present

## 2020-07-26 DIAGNOSIS — I4891 Unspecified atrial fibrillation: Secondary | ICD-10-CM | POA: Diagnosis not present

## 2020-07-26 LAB — CBC
HCT: 22.6 % — ABNORMAL LOW (ref 36.0–46.0)
Hemoglobin: 7.6 g/dL — ABNORMAL LOW (ref 12.0–15.0)
MCH: 32.6 pg (ref 26.0–34.0)
MCHC: 33.6 g/dL (ref 30.0–36.0)
MCV: 97 fL (ref 80.0–100.0)
Platelets: 251 10*3/uL (ref 150–400)
RBC: 2.33 MIL/uL — ABNORMAL LOW (ref 3.87–5.11)
RDW: 15.4 % (ref 11.5–15.5)
WBC: 14.1 10*3/uL — ABNORMAL HIGH (ref 4.0–10.5)
nRBC: 0.4 % — ABNORMAL HIGH (ref 0.0–0.2)

## 2020-07-26 LAB — BASIC METABOLIC PANEL
Anion gap: 12 (ref 5–15)
BUN: 66 mg/dL — ABNORMAL HIGH (ref 8–23)
CO2: 20 mmol/L — ABNORMAL LOW (ref 22–32)
Calcium: 7.4 mg/dL — ABNORMAL LOW (ref 8.9–10.3)
Chloride: 110 mmol/L (ref 98–111)
Creatinine, Ser: 4.37 mg/dL — ABNORMAL HIGH (ref 0.44–1.00)
GFR, Estimated: 9 mL/min — ABNORMAL LOW (ref >60)
Glucose, Bld: 176 mg/dL — ABNORMAL HIGH (ref 70–99)
Potassium: 3.7 mmol/L (ref 3.5–5.1)
Sodium: 142 mmol/L (ref 135–145)

## 2020-07-26 LAB — IRON AND TIBC
Iron: 44 ug/dL (ref 28–170)
Saturation Ratios: 29 % (ref 10.4–31.8)
TIBC: 150 ug/dL — ABNORMAL LOW (ref 250–450)
UIBC: 106 ug/dL

## 2020-07-26 LAB — PROTIME-INR
INR: 1.3 — ABNORMAL HIGH (ref 0.8–1.2)
Prothrombin Time: 15.8 seconds — ABNORMAL HIGH (ref 11.4–15.2)

## 2020-07-26 LAB — CORTISOL: Cortisol, Plasma: 32.7 ug/dL

## 2020-07-26 LAB — CORTISOL-AM, BLOOD: Cortisol - AM: 30.2 ug/dL — ABNORMAL HIGH (ref 6.7–22.6)

## 2020-07-26 LAB — VITAMIN B12: Vitamin B-12: 129 pg/mL — ABNORMAL LOW (ref 180–914)

## 2020-07-26 LAB — FERRITIN: Ferritin: 391 ng/mL — ABNORMAL HIGH (ref 11–307)

## 2020-07-26 LAB — FOLATE: Folate: 6.2 ng/mL (ref >5.9)

## 2020-07-26 MED ORDER — CYANOCOBALAMIN 1000 MCG/ML IJ SOLN
1000.0000 ug | Freq: Every day | INTRAMUSCULAR | Status: DC
  Administered 2020-07-26 – 2020-07-31 (×6): 1000 ug via SUBCUTANEOUS
  Filled 2020-07-26 (×6): qty 1

## 2020-07-26 MED ORDER — CHLORHEXIDINE GLUCONATE CLOTH 2 % EX PADS
6.0000 | MEDICATED_PAD | Freq: Every day | CUTANEOUS | Status: DC
  Administered 2020-07-26 – 2020-07-30 (×5): 6 via TOPICAL

## 2020-07-26 MED ORDER — METOPROLOL TARTRATE 5 MG/5ML IV SOLN
2.5000 mg | Freq: Four times a day (QID) | INTRAVENOUS | Status: DC
  Administered 2020-07-26 – 2020-07-31 (×21): 2.5 mg via INTRAVENOUS
  Filled 2020-07-26 (×19): qty 5

## 2020-07-26 MED ORDER — SODIUM CHLORIDE 0.9 % IV SOLN: INTRAVENOUS | Status: DC

## 2020-07-26 NOTE — Progress Notes (Signed)
Patient admitted to ICU room 1. 2 rn skin assessment completed. Noted open wound to right lateral ankle, approximately 3in x 3in with marbled tissue. Also noted redness to right Ischial tuberosity- foam dressing placed. Pup foam dressing to sacrum. Large hematoma noted to right breast and rib cage. MD notified

## 2020-07-26 NOTE — Consult Note (Signed)
Referring Provider: Mauro Kaufmann, MD Primary Care Physician:  [name], NP Primary Gastroenterologist:  Dr. Karilyn Cota  Reason for Consultation:    Profound anemia.  HPI:   History is obtained from patient's son Langston Masker who is at bedside.  Patient lives with her son.  Patient is 87 year old African American female with history of chronic anemia atrial fibrillation on anticoagulation chronic kidney disease as well as dementia who lives at home with her son who looks after with help from his sister and caregivers.  Patient is limited to bed and wheelchair.  She has not walked in 6 years. She had planned blood work 2 days ago.  Her hemoglobin was 5 g and INR was 8.0.  Patient was therefore advised to go to emergency room. On admission her hemoglobin was 4 g hematocrit was 14.1 and MCV was elevated at 111.9.  Hemoglobin in January last year was 11.1 and MCV was 95.5.  Chemistry panel was pertinent for serum creatinine of 4.67.  Creatinine was 3.72 days ago and 1.84 in January 2021.  High INR was 9.5. She was noted to have large hematoma involving right chest.  She had chest and abdominal pelvic CT which did not reveal any other abnormality of concern. Patient was given vitamin K to correct coagulopathy.  She was admitted to ICU. She was hypothermic on admission.  Her procalcitonin level was 0.37.  Blood cultures were obtained and these are negative.  Her cortisol levels have been normal. Patient has received 3 units of PRBCs.  Hemoglobin is up to 7.6 g.  Serum creatinine has decreased to 4.37.  According to her son she has not had any nausea vomiting melena or rectal bleeding.  No history of hematuria or vaginal bleeding. Patient is incontinent most of the times and using bedpan.  Every now and then she is able to use commode.  No history of dysphagia according to her son. He states he has help from 10-2 and again from 7 to 9 PM every day from CNS. His sister helps some but she walks. She has  been on warfarin for several years.  Her level was checked in December and was decided to check it every 3 months rather than every month given patient's condition. There is no history of recent fall.  She was noted to have developed ulcer on medial aspect of left ankle about 2 weeks ago.  He says she has a history of varicose veins and had ulcer few years ago and it finally healed.     Past Medical History:  Diagnosis Date  . Anemia    Hemoglobin of 11.6 in 01/2010; 12.1 in 08/2010  . Atrial fibrillation (HCC)    chronic requiring anticoagulation  . Chronic anticoagulation   . Chronic kidney disease    Stage III; creatinine of 1.7 in 04/2007 and 1.5 2009; 1.7 in 04/2010  . Hyperlipidemia    Total cholesterol of 226, triglycerides of 90, HDL of 49 and LDL of 159 in 01/2010  . Hypertension     Past Surgical History:  Procedure Laterality Date  . APPENDECTOMY    . BUNIONECTOMY  1995  . PARTIAL HYSTERECTOMY      Prior to Admission medications   Medication Sig Start Date End Date Taking? Authorizing Provider  acetaminophen (TYLENOL) 500 MG tablet Take 1 tablet (500 mg total) by mouth every 8 (eight) hours. 07/17/20   Ngetich, Dinah C, NP  amLODipine (NORVASC) 10 MG tablet Take 10 mg by mouth daily.  [provider]  donepezil (ARICEPT) 10 MG tablet Take 1 tablet by mouth once daily with breakfast 05/07/20   Salley Scarleturham, Kawanta F, MD  doxycycline (VIBRA-TABS) 100 MG tablet Take 1 tablet (100 mg total) by mouth 2 (two) times daily. 07/17/20   Ngetich, Dinah C, NP  famotidine (PEPCID) 40 MG tablet Take 0.5 tablets (20 mg total) by mouth 2 (two) times daily. 04/18/20   Salley Scarleturham, Kawanta F, MD  hydrOXYzine (ATARAX/VISTARIL) 10 MG tablet Take 1 tablet (10 mg total) by mouth 3 (three) times daily as needed for itching. 04/23/20   Valentino NoseMartinez, Jessica A, NP  LORazepam (ATIVAN) 1 MG tablet TAKE 1 TABLET BY MOUTH AT BEDTIME AS NEEDED FOR ANXIETY AND  AGITATION 05/07/20   Salley Scarleturham, Kawanta F, MD   metoprolol tartrate (LOPRESSOR) 50 MG tablet Take 50 mg by mouth 2 (two) times daily.    [provider]  omeprazole (PRILOSEC) 40 MG capsule Take 1 capsule by mouth once daily 05/07/20   Salley Scarleturham, Kawanta F, MD  pravastatin (PRAVACHOL) 10 MG tablet Take 1 tablet (10 mg total) by mouth daily. 05/22/19   [place], Velna HatchetKawanta F, MD  saccharomyces boulardii (FLORASTOR) 250 MG capsule Take 1 capsule (250 mg total) by mouth 2 (two) times daily. 07/17/20   Ngetich, Dinah C, NP  traZODone (DESYREL) 50 MG tablet Take 0.5-1 tablets (25-50 mg total) by mouth at bedtime as needed for sleep. 07/17/20   Ngetich, Dinah C, NP  warfarin (COUMADIN) 2.5 MG tablet TAKE AS DIRECTED BY COUMADIN CLINIC 12/10/19   Netta NeatQuinn, Andrew L Jr., NP  warfarin (COUMADIN) 4 MG tablet Take 4 mg by mouth. Monday,Wednesday and Friday along with coumadin 2.5 mg tablet ( Total 6.5 mg Tablet ).    [provider]    Current Facility-Administered Medications  Medication Dose Route Frequency Provider Last Rate Last Admin  . 0.9 %  sodium chloride infusion  10 mL/hr Intravenous Once Layden, Lindsey A, PA-C      . 0.9 %  sodium chloride infusion  10 mL/hr Intravenous Once Layden, Lindsey A, PA-C      . 0.9 %  sodium chloride infusion   Intravenous Continuous Sharl MaLama, Sarina IllGagan S, MD      . acetaminophen (TYLENOL) tablet 650 mg  650 mg Oral Q6H PRN Tat, Onalee Huaavid, MD       Or  . acetaminophen (TYLENOL) suppository 650 mg  650 mg Rectal Q6H PRN Tat, Onalee Huaavid, MD      . amLODipine (NORVASC) tablet 10 mg  10 mg Oral Daily Tat, David, MD      . ceFEPIme (MAXIPIME) 1 g in sodium chloride 0.9 % 100 mL IVPB  1 g Intravenous Q24H Catarina Hartshornat, David, MD   Stopped at 07/19/2020 1918  . Chlorhexidine Gluconate Cloth 2 % PADS 6 each  6 each Topical Daily Meredeth IdeLama, Gagan S, MD   6 each at 07/26/20 [phone]  . cyanocobalamin ((VITAMIN B-12)) injection 1,000 mcg  1,000 mcg Subcutaneous Daily Sharl MaLama, Sarina IllGagan S, MD      . donepezil (ARICEPT) tablet 10 mg  10 mg Oral Daily Tat, David, MD       . metoprolol tartrate (LOPRESSOR) injection 2.5 mg  2.5 mg Intravenous Q6H Meredeth IdeLama, Gagan S, MD   2.5 mg at 07/26/20 1149  . ondansetron (ZOFRAN) tablet 4 mg  4 mg Oral Q6H PRN Tat, David, MD       Or  . ondansetron (ZOFRAN) injection 4 mg  4 mg Intravenous Q6H PRN Catarina Hartshornat, David, MD      .  pantoprazole (PROTONIX) injection 40 mg  40 mg Intravenous Q24H Adefeso, Oladapo, DO   40 mg at 07/14/2020 2259  . pravastatin (PRAVACHOL) tablet 10 mg  10 mg Oral Daily Tat, David, MD      . sodium bicarbonate 150 mEq in dextrose 5 % 1,000 mL infusion   Intravenous Continuous Tat, David, MD 75 mL/hr at 07/26/20 0753 Infusion Verify at 07/26/20 0753  . traZODone (DESYREL) tablet 25 mg  25 mg Oral QHS PRN Catarina Hartshorn, MD        Allergies as of 07/01/2020  . (No Known Allergies)    Family History  Problem Relation Age of Onset  . Diabetes Mother   . Heart disease Father   . Diabetes Sister   . Diabetes Brother     Social History   Socioeconomic History  . Marital status: Single    Spouse name: Not on file  . Number of children: Not on file  . Years of education: Not on file  . Highest education level: Not on file  Occupational History  . Occupation: Retired    Comment: CNA at a local SNF  Tobacco Use  . Smoking status: Never Smoker  . Smokeless tobacco: Never Used  Vaping Use  . Vaping Use: Never used  Substance and Sexual Activity  . Alcohol use: No    Alcohol/week: 0.0 standard drinks  . Drug use: No  . Sexual activity: Not Currently  Other Topics Concern  . Not on file  Social History Narrative  . Not on file   Social Determinants of Health   Financial Resource Strain: Not on file  Food Insecurity: Not on file  Transportation Needs: Not on file  Physical Activity: Not on file  Stress: Not on file  Social Connections: Not on file  Intimate Partner Violence: Not on file    Review of Systems: See HPI, otherwise normal ROS  Physical Exam: Temp:  [93.2 F (34 C)-99 F (37.2  C)] 99 F (37.2 C) (03/26 1049) Pulse Rate:  [42-85] 80 (03/26 0800) Resp:  [8-34] 15 (03/26 1049) BP: (139-196)/(29-125) 150/29 (03/26 1000) SpO2:  [26 %-100 %] 100 % (03/26 0800) FiO2 (%):  [32 %] 32 % (03/26 0000) Weight:  [48.7 kg] 48.7 kg (03/25 1501)   Patient is alert but is confused and responds appropriate to simple questions such as opening mouth. Conjunctiva is pale.  Sclerae nonicteric. Oropharyngeal mucosa is normal.  She has upper dentures in edentulous and lower jaw. No neck masses or thyromegaly. Cardiac exam with regular rhythm normal S1 and S2.  No murmur gallop noted. Auscultation of lungs reveal vesicular breath sounds bilaterally. Abdomen is flat and soft and nontender without organomegaly or masses. Both knees are fully flex.  She declined to straighten them.  I was not able to examine ankle ulcer.  She is wearing booties.    Lab Results: Recent Labs    07/24/20 1125 07/06/2020 1024 07/26/20 0513  WBC 8.6 13.5* 14.1*  HGB 5.0* 4.0* 7.6*  HCT 15.8* 14.1* 22.6*  PLT 289 334 251   BMET Recent Labs    07/24/20 1125 07/16/2020 1024 07/26/20 0513  NA 135 138 142  K 4.1 5.0 3.7  CL 113* 114* 110  CO2 13* 8* 20*  GLUCOSE 106* 120* 176*  BUN 58* 69* 66*  CREATININE 3.70* 4.67* 4.37*  CALCIUM 7.4* 8.0* 7.4*   LFT Recent Labs    07/29/2020 1024  PROT 5.4*  ALBUMIN 2.2*  AST 21  ALT 9  ALKPHOS 59  BILITOT 0.4   PT/INR Recent Labs    07/03/2020 1024 07/26/20 0513  LABPROT 74.4* 15.8*  INR 9.5* 1.3*   TSH level 1.831 on 07/24/2020. Serum iron 44, TIBC 150 saturation 29% Serum ferritin 391 Folate level 6.2 B12 129  Studies/Results: CT ABDOMEN PELVIS WO CONTRAST  Result Date: 07/26/2020 CLINICAL DATA:  Anemia, supratherapeutic INR, concern for retroperitoneal hematoma EXAM: CT CHEST, ABDOMEN AND PELVIS WITHOUT CONTRAST TECHNIQUE: Multidetector CT imaging of the chest, abdomen and pelvis was performed following the standard protocol without IV  contrast. COMPARISON:  CT chest dated 07/15/2018 FINDINGS: CT CHEST FINDINGS Cardiovascular: Cardiomegaly with left atrial enlargement. No pericardial effusion. No evidence of thoracic aortic aneurysm. Atherosclerotic calcifications of the aortic arch. Coronary atherosclerosis of the LAD and right coronary artery. Mediastinum/Nodes: No suspicious mediastinal lymphadenopathy. Visualized thyroid is unremarkable. Lungs/Pleura: Biapical pleural-parenchymal scarring. Trace chronic right pleural fluid with scarring/volume loss in the right lower lobe, improved. Mild centrilobular emphysematous changes in the upper lobes. Mild subpleural reticulation in the lungs bilaterally. Evaluation lung parenchyma is constrained by respiratory motion. Within that constraint, there are no suspicious pulmonary nodules. No pneumothorax. Musculoskeletal: 8.3 x 10.2 x 9.2 cm right chest hematoma (series 3/image 8), likely a intramuscular hematoma centered between the pectoralis major and minor musculature, extending into the deep axilla. Surrounding fluid/chest wall edema extending inferiorly along the dependent right anterior abdominal wall (series 3/image 44). Mild degenerative changes of the visualized thoracolumbar spine. Moderate superior endplate compression fracture deformity at T12, chronic. No retropulsion. CT ABDOMEN PELVIS FINDINGS Hepatobiliary: Unenhanced liver is unremarkable. Gallbladder is grossly unremarkable. No intrahepatic or extrahepatic ductal dilatation. Pancreas: Grossly unremarkable. Spleen: Within normal limits. Adrenals/Urinary Tract: Adrenal glands are within normal limits. Kidneys are within normal limits. No renal calculi or hydronephrosis. Bladder is within normal limits. Stomach/Bowel: Stomach is within normal limits. No evidence of bowel obstruction. Appendix is not discretely visualized. Sigmoid diverticulosis, without evidence of diverticulitis. Vascular/Lymphatic: No evidence of abdominal aortic  aneurysm. Atherosclerotic calcifications of the abdominal aorta and branch vessels. No suspicious abdominopelvic lymphadenopathy. Reproductive: Suspected prior hysterectomy. No adnexal masses. Other: No abdominopelvic ascites. No free air. Musculoskeletal: Degenerative changes of the lumbar spine. IMPRESSION: 10.2 cm intramuscular hematoma in the right chest wall, as described above, likely secondary to the patient's supratherapeutic INR. Additional chronic ancillary findings, as described above. Aortic Atherosclerosis (ICD10-I70.0) and Emphysema (ICD10-J43.9). Electronically Signed   By: Charline Bills M.D.   On: 07/26/2020 06:11   CT Head Wo Contrast  Result Date: 07/29/2020 CLINICAL DATA:  Altered mental status EXAM: CT HEAD WITHOUT CONTRAST TECHNIQUE: Contiguous axial images were obtained from the base of the skull through the vertex without intravenous contrast. COMPARISON:  None. FINDINGS: Brain: No evidence of acute infarction, hemorrhage, hydrocephalus, extra-axial collection or mass lesion/mass effect. Advanced low-density changes within the periventricular and subcortical white matter compatible with chronic microvascular ischemic change. Moderate diffuse cerebral volume loss. Vascular: Atherosclerotic calcifications involving the large vessels of the skull base. No unexpected hyperdense vessel. Skull: Normal. Negative for fracture or focal lesion. Sinuses/Orbits: Complete opacification of the bilateral mastoid air cells. Right maxillary mucous retention cyst. Other: None. IMPRESSION: 1. No acute intracranial findings. 2. Advanced chronic microvascular ischemic change and cerebral volume loss. 3. Complete opacification of the bilateral mastoid air cells. Electronically Signed   By: Duanne Guess D.O.   On: 07/05/2020 12:40   CT CHEST WO CONTRAST  Result Date: 07/26/2020 CLINICAL DATA:  Anemia, supratherapeutic INR, concern for retroperitoneal  hematoma EXAM: CT CHEST, ABDOMEN AND PELVIS  WITHOUT CONTRAST TECHNIQUE: Multidetector CT imaging of the chest, abdomen and pelvis was performed following the standard protocol without IV contrast. COMPARISON:  CT chest dated 07/15/2018 FINDINGS: CT CHEST FINDINGS Cardiovascular: Cardiomegaly with left atrial enlargement. No pericardial effusion. No evidence of thoracic aortic aneurysm. Atherosclerotic calcifications of the aortic arch. Coronary atherosclerosis of the LAD and right coronary artery. Mediastinum/Nodes: No suspicious mediastinal lymphadenopathy. Visualized thyroid is unremarkable. Lungs/Pleura: Biapical pleural-parenchymal scarring. Trace chronic right pleural fluid with scarring/volume loss in the right lower lobe, improved. Mild centrilobular emphysematous changes in the upper lobes. Mild subpleural reticulation in the lungs bilaterally. Evaluation lung parenchyma is constrained by respiratory motion. Within that constraint, there are no suspicious pulmonary nodules. No pneumothorax. Musculoskeletal: 8.3 x 10.2 x 9.2 cm right chest hematoma (series 3/image 8), likely a intramuscular hematoma centered between the pectoralis major and minor musculature, extending into the deep axilla. Surrounding fluid/chest wall edema extending inferiorly along the dependent right anterior abdominal wall (series 3/image 44). Mild degenerative changes of the visualized thoracolumbar spine. Moderate superior endplate compression fracture deformity at T12, chronic. No retropulsion. CT ABDOMEN PELVIS FINDINGS Hepatobiliary: Unenhanced liver is unremarkable. Gallbladder is grossly unremarkable. No intrahepatic or extrahepatic ductal dilatation. Pancreas: Grossly unremarkable. Spleen: Within normal limits. Adrenals/Urinary Tract: Adrenal glands are within normal limits. Kidneys are within normal limits. No renal calculi or hydronephrosis. Bladder is within normal limits. Stomach/Bowel: Stomach is within normal limits. No evidence of bowel obstruction. Appendix is not  discretely visualized. Sigmoid diverticulosis, without evidence of diverticulitis. Vascular/Lymphatic: No evidence of abdominal aortic aneurysm. Atherosclerotic calcifications of the abdominal aorta and branch vessels. No suspicious abdominopelvic lymphadenopathy. Reproductive: Suspected prior hysterectomy. No adnexal masses. Other: No abdominopelvic ascites. No free air. Musculoskeletal: Degenerative changes of the lumbar spine. IMPRESSION: 10.2 cm intramuscular hematoma in the right chest wall, as described above, likely secondary to the patient's supratherapeutic INR. Additional chronic ancillary findings, as described above. Aortic Atherosclerosis (ICD10-I70.0) and Emphysema (ICD10-J43.9). Electronically Signed   By: Charline Bills M.D.   On: 07/26/2020 06:11   US RENAL  Result Date: 07/01/2020 CLINICAL DATA:  Acute on chronic renal failure, tobacco abuse, chronic renal insufficiency EXAM: RENAL / URINARY TRACT ULTRASOUND COMPLETE COMPARISON:  None. FINDINGS: Right Kidney: Not visualized. Limited by patient cooperation and inability to position the patient. Left Kidney: Not visualized. Limited by patient cooperation and inability to position the patient. Bladder: Not visualized. Limited by patient cooperation and inability to position patient. Other: None. IMPRESSION: 1. Nondiagnostic evaluation. Kidneys and bladder could not be identified, and evaluation is limited due to inability to position the patient. Electronically Signed   By: Sharlet Salina M.D.   On: 07/22/2020 15:15   DG Chest Portable 1 View  Result Date: 07/26/2020 CLINICAL DATA:  Altered mental status EXAM: PORTABLE CHEST 1 VIEW COMPARISON:  Chest CT from July 15, 2018 FINDINGS: Limited rotated chest. Asymmetric density at the right base with costophrenic sulcus blunting. No edema or pneumothorax. Large volume lungs in general. IMPRESSION: 1. Limited rotated chest. 2. Small right pleural effusion which could be chronic or related to  scarring based on a 2020 chest CT. Electronically Signed   By: Marnee Spring M.D.   On: 07/15/2020 10:48    Assessment;  Patient is 87 year old African-American female with acute on chronic anemia requiring blood transfusion.  She is chronically anticoagulated and has supratherapeutic INR which has been corrected with vitamin K.  She was also hypothermic on admission.  Cortisol  levels are normal and blood cultures remain negative. Regarding her anemia work-up includes guaiac negative stool, normal iron studies and folate level but low B12.  She is getting ready to receive first dose of vitamin B12. Her anemia does not appear to be due to GI bleed.  She has a large hematoma involving right chest extending posteriorly.  Chest CT does not reveal any pulmonary or pleural process.  This is probably the main reason for recent drop in her H&H.  Patient was hypothermic on admission.  Temperature now is normal.  TSH and cortisol levels are normal.  History of atrial fibrillation.  Patient has been on anticoagulant in the form of warfarin for several years according to her son.  Dementia.  Patient is cared for at home by her son who lives with her with help from her daughter who is still working and caregivers were there daily for 6 to 7 hours.  It remains to be seen if cognitive function would impair with B12 replacement.  Patient has ulcer along medial aspect of right ankle which started 2 weeks ago.  Recommendations;  No indication for endoscopic evaluation. Patient's need for continued anticoagulation needs to be reevaluated as risk may outweigh benefit. Diet advanced to mechanical soft diet.    LOS: 1 day   Fotios Amos  07/26/2020, 1:16 PM

## 2020-07-26 NOTE — Progress Notes (Addendum)
[facility]  PROGRESS NOTE  [name] WUJ:[id number] DOB: [birth year] DOA: 2020-08-04 PCP: [name], NP   Brief HPI:   87 year old female with history of dementia, CKD stage III, hypertension, hyperlipidemia, peripheral vascular disease, atrial fibrillation presented with low hemoglobin.  Apparently patient had blood work done on 06/26/2020 by home RN.  Residentials one 5.0 patient was sent to ED for further evaluation.  Patient is a poor historian due to underlying history of dementia.  As per patient's son and daughter she is essentially bedbound/wheelchair bound, requires significant assistance with ADLs.  She is able to feed herself.  In the ED she was hypothermic with temperature 93.2 F.  CT chest/abdomen pelvis was obtained.  CT chest showed large right chest wall hematoma.  She was found to have INR of 9.5.  She received vitamin K 5 mg IV.  Hemoglobin was 5.7, repeat hemoglobin was 4.0.  She is s/p 2 units PRBC, this morning 7.6.    Subjective   Patient seen and examined, continues to be confused.   Assessment/Plan:     1. Acute blood loss anemia-secondary to intramuscular hematoma in the right chest wall from supratherapeutic INR.  Patient was found to have INR of 9.5 which has improved 1.3 this morning after she received vitamin K 5 mg and 1 yesterday.  She also received 1 units PRBC, hemoglobin this morning is 7.6.  Anemia panel shows iron saturation of 29%, B12 129.  Follow CBC in a.m. 2. Intramuscular/chest wall hematoma-as above, conservative management. 3. Hypertension-unable to take p.o. medications due to confusion.  Will start metoprolol 2.5 mg IV every 6 hours.  Will hold p.o. amlodipine. 4. Hypothermia-patient was hypothermic on presentation temperature 93.2, she was started on empiric cefepime, urine and blood cultures obtained.  Cortisol level this morning is pending.  Follow blood and urine culture results.  Continue empiric antibiotics for now. 5. Vitamin  B12 deficiency-metabolic encephalopathy-patient has underlying dementia, anemia panel obtained yesterday shows B12 level of 129.  Will start vitamin B12 1000 mcg subcu daily for 7 days. 6. Acute kidney injury on CKD stage IIIb-patient baseline creatinine is around 1.5-1.8.  She presented with creatinine of 3.70, likely in setting of poor p.o. intake, dementia.  Today creatinine is 4.37, renal ultrasound was unremarkable.  BUN is 66.  We will check urine sodium, urine creatinine.  Likely prerenal azotemia.  We will start normal saline at 100 mL/h.  Follow BMP in am.  If renal function not improving, consider nephrology consultation. 7. Chronic atrial fibrillation-Coumadin on hold due to coagulopathy.  Started on metoprolol 2.5 mg IV every 6 hours for rate control. 8. Metabolic acidosis-likely in the setting of acute kidney injury, improving.  Today bicarb was 20. 9. Prognosis-patient has extremely poor prognosis in setting of poor p.o. intake, if she does not start eating well, consider palliative care consultation for goals of care.   Scheduled medications:   . amLODipine  10 mg Oral Daily  . Chlorhexidine Gluconate Cloth  6 each Topical Daily  . donepezil  10 mg Oral Daily  . metoprolol tartrate  2.5 mg Intravenous Q6H  . pantoprazole (PROTONIX) IV  40 mg Intravenous Q24H  . pravastatin  10 mg Oral Daily         Data Reviewed:   CBG:  Recent Labs  Lab August 04, 2020 0945  GLUCAP 106*    SpO2: 100 % O2 Flow Rate (L/min): 3 L/min FiO2 (%): 32 %    Vitals:   07/26/20  0400 07/26/20 0500 07/26/20 0700 07/26/20 0714  BP:  (!) 169/44 (!) 159/82   Pulse:   77 77  Resp: (!) 33 (!) 34 14 13  Temp: 97.9 F (36.6 C)   98.5 F (36.9 C)  TempSrc: Rectal   Rectal  SpO2:   100% 100%  Weight:      Height:         Intake/Output Summary (Last 24 hours) at 07/26/2020 0927 Last data filed at 07/26/2020 0753 Gross per 24 hour  Intake 2094.34 ml  Output --  Net 2094.34 ml    03/24 1901 -  03/26 0700 In: 1901.8 [I.V.:1132.4] Out: -   Filed Weights   November 18, 2020 0946 November 18, 2020 1501  Weight: 47.2 kg 48.7 kg    CBC:  Recent Labs  Lab 07/24/20 1125 November 18, 2020 1024 07/26/20 0513  WBC 8.6 13.5* 14.1*  HGB 5.0* 4.0* 7.6*  HCT 15.8* 14.1* 22.6*  PLT 289 334 251  MCV 99.4 111.9* 97.0  MCH 31.4 31.7 32.6  MCHC 31.6 28.4* 33.6  RDW 13.5 14.1 15.4  LYMPHSABS 1.1 1.5  --   MONOABS 0.6 0.8  --   EOSABS 0.0 0.0  --   BASOSABS 0.0 0.0  --     Complete metabolic panel:  Recent Labs  Lab 07/24/20 1125 07/24/20 1140 November 18, 2020 1024 November 18, 2020 1925 07/26/20 0513  NA 135  --  138  --  142  K 4.1  --  5.0  --  3.7  CL 113*  --  114*  --  110  CO2 13*  --  8*  --  20*  GLUCOSE 106*  --  120*  --  176*  BUN 58*  --  69*  --  66*  CREATININE 3.70*  --  4.67*  --  4.37*  CALCIUM 7.4*  --  8.0*  --  7.4*  AST 13*  --  21  --   --   ALT 7  --  9  --   --   ALKPHOS 52  --  59  --   --   BILITOT 0.3  --  0.4  --   --   ALBUMIN 2.0*  --  2.2*  --   --   PROCALCITON  --   --   --  0.37  --   INR  --  8.0* 9.5*  --  1.3*  TSH 1.831  --   --   --   --     No results for input(s): LIPASE, AMYLASE in the last 168 hours.  Recent Labs  Lab November 18, 2020 1925  PROCALCITON 0.37    ------------------------------------------------------------------------------------------------------------------ No results for input(s): CHOL, HDL, LDLCALC, TRIG, CHOLHDL, LDLDIRECT in the last 72 hours.  No results found for: HGBA1C ------------------------------------------------------------------------------------------------------------------ Recent Labs    07/24/20 1125  TSH 1.831   ------------------------------------------------------------------------------------------------------------------ Recent Labs    07/26/20 0513 07/26/20 0514  VITAMINB12 129*  --   FOLATE  --  6.2  FERRITIN 391*  --   TIBC 150*  --   IRON 44  --     Coagulation profile  Recent Labs  Lab 07/24/20 1140  November 18, 2020 1024 07/26/20 0513  INR 8.0* 9.5* 1.3*    No results for input(s): DDIMER in the last 72 hours.  Cardiac Enzymes  No results for input(s): CKMB, TROPONINI, MYOGLOBIN in the last 168 hours.  Invalid input(s): CK ------------------------------------------------------------------------------------------------------------------ No results found for: BNP   Antibiotics: Anti-infectives (From admission, onward)   Start  Dose/Rate Route Frequency Ordered Stop   08-06-2020 1800  ceFEPIme (MAXIPIME) 1 g in sodium chloride 0.9 % 100 mL IVPB        1 g 200 mL/hr over 30 Minutes Intravenous Every 24 hours 08-06-2020 1706         Radiology Reports  CT ABDOMEN PELVIS WO CONTRAST  Result Date: 07/26/2020 CLINICAL DATA:  Anemia, supratherapeutic INR, concern for retroperitoneal hematoma EXAM: CT CHEST, ABDOMEN AND PELVIS WITHOUT CONTRAST TECHNIQUE: Multidetector CT imaging of the chest, abdomen and pelvis was performed following the standard protocol without IV contrast. COMPARISON:  CT chest dated 07/15/2018 FINDINGS: CT CHEST FINDINGS Cardiovascular: Cardiomegaly with left atrial enlargement. No pericardial effusion. No evidence of thoracic aortic aneurysm. Atherosclerotic calcifications of the aortic arch. Coronary atherosclerosis of the LAD and right coronary artery. Mediastinum/Nodes: No suspicious mediastinal lymphadenopathy. Visualized thyroid is unremarkable. Lungs/Pleura: Biapical pleural-parenchymal scarring. Trace chronic right pleural fluid with scarring/volume loss in the right lower lobe, improved. Mild centrilobular emphysematous changes in the upper lobes. Mild subpleural reticulation in the lungs bilaterally. Evaluation lung parenchyma is constrained by respiratory motion. Within that constraint, there are no suspicious pulmonary nodules. No pneumothorax. Musculoskeletal: 8.3 x 10.2 x 9.2 cm right chest hematoma (series 3/image 8), likely a intramuscular hematoma centered  between the pectoralis major and minor musculature, extending into the deep axilla. Surrounding fluid/chest wall edema extending inferiorly along the dependent right anterior abdominal wall (series 3/image 44). Mild degenerative changes of the visualized thoracolumbar spine. Moderate superior endplate compression fracture deformity at T12, chronic. No retropulsion. CT ABDOMEN PELVIS FINDINGS Hepatobiliary: Unenhanced liver is unremarkable. Gallbladder is grossly unremarkable. No intrahepatic or extrahepatic ductal dilatation. Pancreas: Grossly unremarkable. Spleen: Within normal limits. Adrenals/Urinary Tract: Adrenal glands are within normal limits. Kidneys are within normal limits. No renal calculi or hydronephrosis. Bladder is within normal limits. Stomach/Bowel: Stomach is within normal limits. No evidence of bowel obstruction. Appendix is not discretely visualized. Sigmoid diverticulosis, without evidence of diverticulitis. Vascular/Lymphatic: No evidence of abdominal aortic aneurysm. Atherosclerotic calcifications of the abdominal aorta and branch vessels. No suspicious abdominopelvic lymphadenopathy. Reproductive: Suspected prior hysterectomy. No adnexal masses. Other: No abdominopelvic ascites. No free air. Musculoskeletal: Degenerative changes of the lumbar spine. IMPRESSION: 10.2 cm intramuscular hematoma in the right chest wall, as described above, likely secondary to the patient's supratherapeutic INR. Additional chronic ancillary findings, as described above. Aortic Atherosclerosis (ICD10-I70.0) and Emphysema (ICD10-J43.9). Electronically Signed   By: Charline Bills M.D.   On: 07/26/2020 06:11   CT Head Wo Contrast  Result Date: 06-Aug-2020 CLINICAL DATA:  Altered mental status EXAM: CT HEAD WITHOUT CONTRAST TECHNIQUE: Contiguous axial images were obtained from the base of the skull through the vertex without intravenous contrast. COMPARISON:  None. FINDINGS: Brain: No evidence of acute  infarction, hemorrhage, hydrocephalus, extra-axial collection or mass lesion/mass effect. Advanced low-density changes within the periventricular and subcortical white matter compatible with chronic microvascular ischemic change. Moderate diffuse cerebral volume loss. Vascular: Atherosclerotic calcifications involving the large vessels of the skull base. No unexpected hyperdense vessel. Skull: Normal. Negative for fracture or focal lesion. Sinuses/Orbits: Complete opacification of the bilateral mastoid air cells. Right maxillary mucous retention cyst. Other: None. IMPRESSION: 1. No acute intracranial findings. 2. Advanced chronic microvascular ischemic change and cerebral volume loss. 3. Complete opacification of the bilateral mastoid air cells. Electronically Signed   By: Duanne Guess D.O.   On: August 06, 2020 12:40   CT CHEST WO CONTRAST  Result Date: 07/26/2020 CLINICAL DATA:  Anemia, supratherapeutic INR, concern for  retroperitoneal hematoma EXAM: CT CHEST, ABDOMEN AND PELVIS WITHOUT CONTRAST TECHNIQUE: Multidetector CT imaging of the chest, abdomen and pelvis was performed following the standard protocol without IV contrast. COMPARISON:  CT chest dated 07/15/2018 FINDINGS: CT CHEST FINDINGS Cardiovascular: Cardiomegaly with left atrial enlargement. No pericardial effusion. No evidence of thoracic aortic aneurysm. Atherosclerotic calcifications of the aortic arch. Coronary atherosclerosis of the LAD and right coronary artery. Mediastinum/Nodes: No suspicious mediastinal lymphadenopathy. Visualized thyroid is unremarkable. Lungs/Pleura: Biapical pleural-parenchymal scarring. Trace chronic right pleural fluid with scarring/volume loss in the right lower lobe, improved. Mild centrilobular emphysematous changes in the upper lobes. Mild subpleural reticulation in the lungs bilaterally. Evaluation lung parenchyma is constrained by respiratory motion. Within that constraint, there are no suspicious pulmonary  nodules. No pneumothorax. Musculoskeletal: 8.3 x 10.2 x 9.2 cm right chest hematoma (series 3/image 8), likely a intramuscular hematoma centered between the pectoralis major and minor musculature, extending into the deep axilla. Surrounding fluid/chest wall edema extending inferiorly along the dependent right anterior abdominal wall (series 3/image 44). Mild degenerative changes of the visualized thoracolumbar spine. Moderate superior endplate compression fracture deformity at T12, chronic. No retropulsion. CT ABDOMEN PELVIS FINDINGS Hepatobiliary: Unenhanced liver is unremarkable. Gallbladder is grossly unremarkable. No intrahepatic or extrahepatic ductal dilatation. Pancreas: Grossly unremarkable. Spleen: Within normal limits. Adrenals/Urinary Tract: Adrenal glands are within normal limits. Kidneys are within normal limits. No renal calculi or hydronephrosis. Bladder is within normal limits. Stomach/Bowel: Stomach is within normal limits. No evidence of bowel obstruction. Appendix is not discretely visualized. Sigmoid diverticulosis, without evidence of diverticulitis. Vascular/Lymphatic: No evidence of abdominal aortic aneurysm. Atherosclerotic calcifications of the abdominal aorta and branch vessels. No suspicious abdominopelvic lymphadenopathy. Reproductive: Suspected prior hysterectomy. No adnexal masses. Other: No abdominopelvic ascites. No free air. Musculoskeletal: Degenerative changes of the lumbar spine. IMPRESSION: 10.2 cm intramuscular hematoma in the right chest wall, as described above, likely secondary to the patient's supratherapeutic INR. Additional chronic ancillary findings, as described above. Aortic Atherosclerosis (ICD10-I70.0) and Emphysema (ICD10-J43.9). Electronically Signed   By: Charline Bills M.D.   On: 07/26/2020 06:11   US RENAL  Result Date: Aug 02, 2020 CLINICAL DATA:  Acute on chronic renal failure, tobacco abuse, chronic renal insufficiency EXAM: RENAL / URINARY TRACT  ULTRASOUND COMPLETE COMPARISON:  None. FINDINGS: Right Kidney: Not visualized. Limited by patient cooperation and inability to position the patient. Left Kidney: Not visualized. Limited by patient cooperation and inability to position the patient. Bladder: Not visualized. Limited by patient cooperation and inability to position patient. Other: None. IMPRESSION: 1. Nondiagnostic evaluation. Kidneys and bladder could not be identified, and evaluation is limited due to inability to position the patient. Electronically Signed   By: Sharlet Salina M.D.   On: Aug 02, 2020 15:15   DG Chest Portable 1 View  Result Date: Aug 02, 2020 CLINICAL DATA:  Altered mental status EXAM: PORTABLE CHEST 1 VIEW COMPARISON:  Chest CT from July 15, 2018 FINDINGS: Limited rotated chest. Asymmetric density at the right base with costophrenic sulcus blunting. No edema or pneumothorax. Large volume lungs in general. IMPRESSION: 1. Limited rotated chest. 2. Small right pleural effusion which could be chronic or related to scarring based on a 2020 chest CT. Electronically Signed   By: Marnee Spring M.D.   On: 08-02-20 10:48      DVT prophylaxis: SCDs  Code Status: DNR  Family Communication: No family at bedside   Consultants:    Procedures:      Objective    Physical Examination:    General: Appears lethargic  Cardiovascular: S1-S2 regular, no murmur auscultated  Respiratory: Clear to auscultation bilaterally   Abdomen: Abdomen is soft, nontender, no organomegaly  Extremities: No edema in the lower extremities  Neurologic: Alert, not oriented x3, confused, not following commands   Status is: Inpatient  Dispo: The patient is from: Home              Anticipated d/c is to: To be decided              Anticipated d/c date is: 07/28/2020              Patient currently not stable for discharge  Barrier to discharge-metabolic encephalopathy, supratherapeutic INR  COVID-19 Labs  Recent Labs     07/26/20 0513  FERRITIN 391*    No results found for: [id number]  Microbiology  Recent Results (from the past 240 hour(s))  MRSA PCR Screening     Status: None   Collection Time: 07/29/2020  3:37 PM   Specimen: Nasal Mucosa; Nasopharyngeal  Result Value Ref Range Status   MRSA by PCR NEGATIVE NEGATIVE Final    Comment:        The GeneXpert MRSA Assay (FDA approved for NASAL specimens only), is one component of a comprehensive MRSA colonization surveillance program. It is not intended to diagnose MRSA infection nor to guide or monitor treatment for MRSA infections. Performed at [facility], [address], [place], [place] [address]   Culture, blood (Routine X 2) w Reflex to ID Panel     Status: None (Preliminary result)   Collection Time: 07/09/2020  8:11 PM   Specimen: BLOOD LEFT HAND  Result Value Ref Range Status   Specimen Description BLOOD LEFT HAND  Final   Special Requests   Final    BOTTLES DRAWN AEROBIC ONLY Blood Culture adequate volume   Culture   Final    NO GROWTH < 12 HOURS Performed at [facility], [address], [place], [place] [address]    Report Status PENDING  Incomplete  Culture, blood (Routine X 2) w Reflex to ID Panel     Status: None (Preliminary result)   Collection Time: 07/28/2020  8:21 PM   Specimen: BLOOD LEFT ARM  Result Value Ref Range Status   Specimen Description BLOOD LEFT ARM  Final   Special Requests   Final    BOTTLES DRAWN AEROBIC ONLY Blood Culture adequate volume   Culture   Final    NO GROWTH < 12 HOURS Performed at [facility], [address], [place], [place] [address]    Report Status PENDING  Incomplete    Pressure Injury 07/14/2020 Ankle Right;Lateral open granulated tissue (Active)  07/19/2020 1515  Location: Ankle  Location Orientation: Right;Lateral  Staging:   Wound Description (Comments): open granulated tissue  Present on Admission: Yes     Pressure Injury 07/19/2020 Ischial tuberosity Right reddened (Active)   07/12/2020 1515  Location: Ischial tuberosity  Location Orientation: Right  Staging:   Wound Description (Comments): reddened  Present on Admission: Yes     Pressure Injury 07/13/2020 Rib Lateral;Right Large Hematoma, spreading into breast tissue (Active)  07/20/2020 1515  Location: Rib  Location Orientation: Lateral;Right  Staging:   Wound Description (Comments): Large Hematoma, spreading into breast tissue  Present on Admission: Yes          Jackqueline Aquilar S Xzayvion Vaeth   [facility] If 7PM-7AM, please contact night-coverage at [email or web address], Office  [phone]   07/26/2020, 9:27 AM  LOS: 1 day

## 2020-07-27 DIAGNOSIS — D649 Anemia, unspecified: Secondary | ICD-10-CM | POA: Diagnosis not present

## 2020-07-27 DIAGNOSIS — I4891 Unspecified atrial fibrillation: Secondary | ICD-10-CM | POA: Diagnosis not present

## 2020-07-27 DIAGNOSIS — D62 Acute posthemorrhagic anemia: Secondary | ICD-10-CM | POA: Diagnosis not present

## 2020-07-27 DIAGNOSIS — D689 Coagulation defect, unspecified: Secondary | ICD-10-CM | POA: Diagnosis not present

## 2020-07-27 DIAGNOSIS — Z7901 Long term (current) use of anticoagulants: Secondary | ICD-10-CM | POA: Diagnosis not present

## 2020-07-27 DIAGNOSIS — N179 Acute kidney failure, unspecified: Secondary | ICD-10-CM | POA: Diagnosis not present

## 2020-07-27 LAB — SODIUM, URINE, RANDOM: Sodium, Ur: 69 mmol/L

## 2020-07-27 LAB — COMPREHENSIVE METABOLIC PANEL
ALT: 17 U/L (ref 0–44)
AST: 27 U/L (ref 15–41)
Albumin: 2.1 g/dL — ABNORMAL LOW (ref 3.5–5.0)
Alkaline Phosphatase: 56 U/L (ref 38–126)
Anion gap: 11 (ref 5–15)
BUN: 58 mg/dL — ABNORMAL HIGH (ref 8–23)
CO2: 22 mmol/L (ref 22–32)
Calcium: 6.8 mg/dL — ABNORMAL LOW (ref 8.9–10.3)
Chloride: 106 mmol/L (ref 98–111)
Creatinine, Ser: 4.05 mg/dL — ABNORMAL HIGH (ref 0.44–1.00)
GFR, Estimated: 10 mL/min — ABNORMAL LOW (ref >60)
Glucose, Bld: 95 mg/dL (ref 70–99)
Potassium: 3.4 mmol/L — ABNORMAL LOW (ref 3.5–5.1)
Sodium: 139 mmol/L (ref 135–145)
Total Bilirubin: 0.6 mg/dL (ref 0.3–1.2)
Total Protein: 5 g/dL — ABNORMAL LOW (ref 6.5–8.1)

## 2020-07-27 LAB — CBC
HCT: 21.8 % — ABNORMAL LOW (ref 36.0–46.0)
Hemoglobin: 7.4 g/dL — ABNORMAL LOW (ref 12.0–15.0)
MCH: 33 pg (ref 26.0–34.0)
MCHC: 33.9 g/dL (ref 30.0–36.0)
MCV: 97.3 fL (ref 80.0–100.0)
Platelets: 214 10*3/uL (ref 150–400)
RBC: 2.24 MIL/uL — ABNORMAL LOW (ref 3.87–5.11)
RDW: 16 % — ABNORMAL HIGH (ref 11.5–15.5)
WBC: 11.7 10*3/uL — ABNORMAL HIGH (ref 4.0–10.5)
nRBC: 1.5 % — ABNORMAL HIGH (ref 0.0–0.2)

## 2020-07-27 LAB — CREATININE, URINE, RANDOM: Creatinine, Urine: 70.61 mg/dL

## 2020-07-27 MED ORDER — POTASSIUM CHLORIDE 10 MEQ/100ML IV SOLN
10.0000 meq | Freq: Once | INTRAVENOUS | Status: AC
  Administered 2020-07-27: 10 meq via INTRAVENOUS
  Filled 2020-07-27: qty 100

## 2020-07-27 NOTE — Progress Notes (Signed)
Patient is awake but does not respond to vocal stimuli. Yesterday she was very very talkative and confused. Patient's son is not at bedside. According to nursing staff she has been having brown stools. Hemoglobin is 7.4 g.  Acute on chronic anemia appears to be due to large ecchymosis involving right chest and perhaps B12 deficiency. No evidence of GI bleed.  Will sign off.

## 2020-07-27 NOTE — Progress Notes (Signed)
[facility]  PROGRESS NOTE  SHEKELA GOODRIDGE OEH:[id number] DOB: [birth year] DOA: August 21, 2020 PCP: [name], NP   Brief HPI:   87 year old female with history of dementia, CKD stage III, hypertension, hyperlipidemia, peripheral vascular disease, atrial fibrillation presented with low hemoglobin.  Apparently patient had blood work done on 06/26/2020 by home RN.  Residentials one 5.0 patient was sent to ED for further evaluation.  Patient is a poor historian due to underlying history of dementia.  As per patient's son and daughter she is essentially bedbound/wheelchair bound, requires significant assistance with ADLs.  She is able to feed herself.  In the ED she was hypothermic with temperature 93.2 F.  CT chest/abdomen pelvis was obtained.  CT chest showed large right chest wall hematoma.  She was found to have INR of 9.5.  She received vitamin K 5 mg IV.  Hemoglobin was 5.7, repeat hemoglobin was 4.0.  She is s/p 2 units PRBC, this morning 7.6.    Subjective   This morning patient continues to be alert, confused.  Though she looks better than yesterday.  No longer having episodes of bradycardia.   Assessment/Plan:     1. Acute blood loss anemia-secondary to intramuscular hematoma in the right chest wall from supratherapeutic INR.  Patient was found to have INR of 9.5 which has improved 1.3 this morning after she received vitamin K 5 mg and 1 yesterday.  She also received 1 units PRBC, hemoglobin this morning is 7.6.  Anemia panel shows iron saturation of 29%, B12 129.  Follow CBC in a.m. 2. Intramuscular/chest wall hematoma-as above, conservative management. 3. Hypertension-unable to take p.o. medications due to confusion.  Will start metoprolol 2.5 mg IV every 6 hours.  Will hold p.o. amlodipine. 4. Hypothermia-patient was hypothermic on presentation temperature 93.2, she was started on empiric cefepime, urine and blood cultures obtained.  Cortisol level this morning is pending.   Follow blood and urine culture results.  Continue empiric antibiotics for now. 5. Dementia/Vitamin B12 deficiency-metabolic encephalopathy-patient has underlying dementia, anemia panel obtained yesterday shows B12 level of 129.  Patient was started vitamin B12 1000 mcg subcu daily for 7 days.  She can be discharged on p.o. vitamin B12 1000 mcg daily.  Recommend to check B12 level in 3 months.  Continue Aricept. 6. Acute kidney injury on CKD stage IIIb-patient baseline creatinine is around 1.5-1.8.  She presented with creatinine of 3.70, likely in setting of poor p.o. intake, dementia.  Today creatinine is 4.05, renal ultrasound was unremarkable.  BUN is 66.  We will check urine sodium, urine creatinine, to check FeNa.  Likely prerenal azotemia.  We will start normal saline at 100 mL/h.  Follow BMP in am.  If renal function not improving, consider nephrology consultation. 7. Hypokalemia-potassium 3.4, replace potassium and follow BMP in am. 8. Chronic atrial fibrillation-Coumadin on hold due to coagulopathy.  Started on metoprolol 2.5 mg IV every 6 hours for rate control.  Continue amlodipine. 9. Metabolic acidosis-likely in the setting of acute kidney injury, improving.  Today bicarb has improved to 22 10. Prognosis-patient has extremely poor prognosis in setting of poor p.o. intake, if she does not start eating well, consider palliative care consultation for goals of care.   Scheduled medications:   . amLODipine  10 mg Oral Daily  . Chlorhexidine Gluconate Cloth  6 each Topical Daily  . cyanocobalamin  1,000 mcg Subcutaneous Daily  . donepezil  10 mg Oral Daily  . metoprolol tartrate  2.5 mg Intravenous  Q6H  . pantoprazole (PROTONIX) IV  40 mg Intravenous Q24H  . pravastatin  10 mg Oral Daily         Data Reviewed:   CBG:  Recent Labs  Lab 08/20/20 0945  GLUCAP 106*    SpO2: 100 % O2 Flow Rate (L/min): 2 L/min FiO2 (%): 28 %    Vitals:   07/27/20 0700 07/27/20 0709 07/27/20  0800 07/27/20 0808  BP: (!) 169/58     Pulse: 81 80 86   Resp: 17 15 18    Temp: (!) 97.52 F (36.4 C) (!) 97.34 F (36.3 C) 97.88 F (36.6 C)   TempSrc:  Bladder    SpO2: 100% 100% 100% 100%  Weight:      Height:         Intake/Output Summary (Last 24 hours) at 07/27/2020 1355 Last data filed at 07/27/2020 0720 Gross per 24 hour  Intake 1589.96 ml  Output 200 ml  Net 1389.96 ml    03/25 1901 - 03/27 0700 In: 2443.9 [I.V.:2243.9] Out: 200 [Urine:200]  Filed Weights   August 20, 2020 0946 08/20/2020 1501  Weight: 47.2 kg 48.7 kg    CBC:  Recent Labs  Lab 07/24/20 1125 08-20-2020 1024 07/26/20 0513 07/27/20 0753  WBC 8.6 13.5* 14.1* 11.7*  HGB 5.0* 4.0* 7.6* 7.4*  HCT 15.8* 14.1* 22.6* 21.8*  PLT 289 334 251 214  MCV 99.4 111.9* 97.0 97.3  MCH 31.4 31.7 32.6 33.0  MCHC 31.6 28.4* 33.6 33.9  RDW 13.5 14.1 15.4 16.0*  LYMPHSABS 1.1 1.5  --   --   MONOABS 0.6 0.8  --   --   EOSABS 0.0 0.0  --   --   BASOSABS 0.0 0.0  --   --     Complete metabolic panel:  Recent Labs  Lab 07/24/20 1125 07/24/20 1140 Aug 20, 2020 1024 2020-08-20 1925 07/26/20 0513 07/27/20 0753  NA 135  --  138  --  142 139  K 4.1  --  5.0  --  3.7 3.4*  CL 113*  --  114*  --  110 106  CO2 13*  --  8*  --  20* 22  GLUCOSE 106*  --  120*  --  176* 95  BUN 58*  --  69*  --  66* 58*  CREATININE 3.70*  --  4.67*  --  4.37* 4.05*  CALCIUM 7.4*  --  8.0*  --  7.4* 6.8*  AST 13*  --  21  --   --  27  ALT 7  --  9  --   --  17  ALKPHOS 52  --  59  --   --  56  BILITOT 0.3  --  0.4  --   --  0.6  ALBUMIN 2.0*  --  2.2*  --   --  2.1*  PROCALCITON  --   --   --  0.37  --   --   INR  --  8.0* 9.5*  --  1.3*  --   TSH 1.831  --   --   --   --   --     No results for input(s): LIPASE, AMYLASE in the last 168 hours.  Recent Labs  Lab 08/20/20 1925  PROCALCITON 0.37    ------------------------------------------------------------------------------------------------------------------ No results for  input(s): CHOL, HDL, LDLCALC, TRIG, CHOLHDL, LDLDIRECT in the last 72 hours.  No results found for: HGBA1C ------------------------------------------------------------------------------------------------------------------ No results for input(s): TSH, T4TOTAL, T3FREE, THYROIDAB in  the last 72 hours.  Invalid input(s): FREET3 ------------------------------------------------------------------------------------------------------------------ Recent Labs    07/26/20 0513 07/26/20 0514  VITAMINB12 129*  --   FOLATE  --  6.2  FERRITIN 391*  --   TIBC 150*  --   IRON 44  --     Coagulation profile  Recent Labs  Lab 07/24/20 1140 2020/08/23 1024 07/26/20 0513  INR 8.0* 9.5* 1.3*    No results for input(s): DDIMER in the last 72 hours.  Cardiac Enzymes  No results for input(s): CKMB, TROPONINI, MYOGLOBIN in the last 168 hours.  Invalid input(s): CK ------------------------------------------------------------------------------------------------------------------ No results found for: BNP   Antibiotics: Anti-infectives (From admission, onward)   Start     Dose/Rate Route Frequency Ordered Stop   Aug 23, 2020 1800  ceFEPIme (MAXIPIME) 1 g in sodium chloride 0.9 % 100 mL IVPB        1 g 200 mL/hr over 30 Minutes Intravenous Every 24 hours 08/23/20 1706         Radiology Reports  CT ABDOMEN PELVIS WO CONTRAST  Result Date: 07/26/2020 CLINICAL DATA:  Anemia, supratherapeutic INR, concern for retroperitoneal hematoma EXAM: CT CHEST, ABDOMEN AND PELVIS WITHOUT CONTRAST TECHNIQUE: Multidetector CT imaging of the chest, abdomen and pelvis was performed following the standard protocol without IV contrast. COMPARISON:  CT chest dated 07/15/2018 FINDINGS: CT CHEST FINDINGS Cardiovascular: Cardiomegaly with left atrial enlargement. No pericardial effusion. No evidence of thoracic aortic aneurysm. Atherosclerotic calcifications of the aortic arch. Coronary atherosclerosis of the LAD and right  coronary artery. Mediastinum/Nodes: No suspicious mediastinal lymphadenopathy. Visualized thyroid is unremarkable. Lungs/Pleura: Biapical pleural-parenchymal scarring. Trace chronic right pleural fluid with scarring/volume loss in the right lower lobe, improved. Mild centrilobular emphysematous changes in the upper lobes. Mild subpleural reticulation in the lungs bilaterally. Evaluation lung parenchyma is constrained by respiratory motion. Within that constraint, there are no suspicious pulmonary nodules. No pneumothorax. Musculoskeletal: 8.3 x 10.2 x 9.2 cm right chest hematoma (series 3/image 8), likely a intramuscular hematoma centered between the pectoralis major and minor musculature, extending into the deep axilla. Surrounding fluid/chest wall edema extending inferiorly along the dependent right anterior abdominal wall (series 3/image 44). Mild degenerative changes of the visualized thoracolumbar spine. Moderate superior endplate compression fracture deformity at T12, chronic. No retropulsion. CT ABDOMEN PELVIS FINDINGS Hepatobiliary: Unenhanced liver is unremarkable. Gallbladder is grossly unremarkable. No intrahepatic or extrahepatic ductal dilatation. Pancreas: Grossly unremarkable. Spleen: Within normal limits. Adrenals/Urinary Tract: Adrenal glands are within normal limits. Kidneys are within normal limits. No renal calculi or hydronephrosis. Bladder is within normal limits. Stomach/Bowel: Stomach is within normal limits. No evidence of bowel obstruction. Appendix is not discretely visualized. Sigmoid diverticulosis, without evidence of diverticulitis. Vascular/Lymphatic: No evidence of abdominal aortic aneurysm. Atherosclerotic calcifications of the abdominal aorta and branch vessels. No suspicious abdominopelvic lymphadenopathy. Reproductive: Suspected prior hysterectomy. No adnexal masses. Other: No abdominopelvic ascites. No free air. Musculoskeletal: Degenerative changes of the lumbar spine.  IMPRESSION: 10.2 cm intramuscular hematoma in the right chest wall, as described above, likely secondary to the patient's supratherapeutic INR. Additional chronic ancillary findings, as described above. Aortic Atherosclerosis (ICD10-I70.0) and Emphysema (ICD10-J43.9). Electronically Signed   By: Charline Bills M.D.   On: 07/26/2020 06:11   CT CHEST WO CONTRAST  Result Date: 07/26/2020 CLINICAL DATA:  Anemia, supratherapeutic INR, concern for retroperitoneal hematoma EXAM: CT CHEST, ABDOMEN AND PELVIS WITHOUT CONTRAST TECHNIQUE: Multidetector CT imaging of the chest, abdomen and pelvis was performed following the standard protocol without IV contrast. COMPARISON:  CT chest dated  07/15/2018 FINDINGS: CT CHEST FINDINGS Cardiovascular: Cardiomegaly with left atrial enlargement. No pericardial effusion. No evidence of thoracic aortic aneurysm. Atherosclerotic calcifications of the aortic arch. Coronary atherosclerosis of the LAD and right coronary artery. Mediastinum/Nodes: No suspicious mediastinal lymphadenopathy. Visualized thyroid is unremarkable. Lungs/Pleura: Biapical pleural-parenchymal scarring. Trace chronic right pleural fluid with scarring/volume loss in the right lower lobe, improved. Mild centrilobular emphysematous changes in the upper lobes. Mild subpleural reticulation in the lungs bilaterally. Evaluation lung parenchyma is constrained by respiratory motion. Within that constraint, there are no suspicious pulmonary nodules. No pneumothorax. Musculoskeletal: 8.3 x 10.2 x 9.2 cm right chest hematoma (series 3/image 8), likely a intramuscular hematoma centered between the pectoralis major and minor musculature, extending into the deep axilla. Surrounding fluid/chest wall edema extending inferiorly along the dependent right anterior abdominal wall (series 3/image 44). Mild degenerative changes of the visualized thoracolumbar spine. Moderate superior endplate compression fracture deformity at T12,  chronic. No retropulsion. CT ABDOMEN PELVIS FINDINGS Hepatobiliary: Unenhanced liver is unremarkable. Gallbladder is grossly unremarkable. No intrahepatic or extrahepatic ductal dilatation. Pancreas: Grossly unremarkable. Spleen: Within normal limits. Adrenals/Urinary Tract: Adrenal glands are within normal limits. Kidneys are within normal limits. No renal calculi or hydronephrosis. Bladder is within normal limits. Stomach/Bowel: Stomach is within normal limits. No evidence of bowel obstruction. Appendix is not discretely visualized. Sigmoid diverticulosis, without evidence of diverticulitis. Vascular/Lymphatic: No evidence of abdominal aortic aneurysm. Atherosclerotic calcifications of the abdominal aorta and branch vessels. No suspicious abdominopelvic lymphadenopathy. Reproductive: Suspected prior hysterectomy. No adnexal masses. Other: No abdominopelvic ascites. No free air. Musculoskeletal: Degenerative changes of the lumbar spine. IMPRESSION: 10.2 cm intramuscular hematoma in the right chest wall, as described above, likely secondary to the patient's supratherapeutic INR. Additional chronic ancillary findings, as described above. Aortic Atherosclerosis (ICD10-I70.0) and Emphysema (ICD10-J43.9). Electronically Signed   By: Charline Bills M.D.   On: 07/26/2020 06:11   US RENAL  Result Date: 2020/08/24 CLINICAL DATA:  Acute on chronic renal failure, tobacco abuse, chronic renal insufficiency EXAM: RENAL / URINARY TRACT ULTRASOUND COMPLETE COMPARISON:  None. FINDINGS: Right Kidney: Not visualized. Limited by patient cooperation and inability to position the patient. Left Kidney: Not visualized. Limited by patient cooperation and inability to position the patient. Bladder: Not visualized. Limited by patient cooperation and inability to position patient. Other: None. IMPRESSION: 1. Nondiagnostic evaluation. Kidneys and bladder could not be identified, and evaluation is limited due to inability to position  the patient. Electronically Signed   By: Sharlet Salina M.D.   On: 08/24/2020 15:15      DVT prophylaxis: SCDs  Code Status: DNR  Family Communication: No family at bedside   Consultants:    Procedures:      Objective    Physical Examination:    General-appears in no acute distress  Heart-S1-S2, regular, no murmur auscultated  Lungs-clear to auscultation bilaterally, no wheezing or crackles auscultated  Abdomen-soft, nontender, no organomegaly  Extremities-no edema in the lower extremities  Neuro-alert, confused   Status is: Inpatient  Dispo: The patient is from: Home              Anticipated d/c is to: To be decided              Anticipated d/c date is: 07/28/2020              Patient currently not stable for discharge  Barrier to discharge-metabolic encephalopathy, supratherapeutic INR  COVID-19 Labs  Recent Labs    07/26/20 0513  FERRITIN 391*  No results found for: [id number]  Microbiology  Recent Results (from the past 240 hour(s))  MRSA PCR Screening     Status: None   Collection Time: Jan 03, 2021  3:37 PM   Specimen: Nasal Mucosa; Nasopharyngeal  Result Value Ref Range Status   MRSA by PCR NEGATIVE NEGATIVE Final    Comment:        The GeneXpert MRSA Assay (FDA approved for NASAL specimens only), is one component of a comprehensive MRSA colonization surveillance program. It is not intended to diagnose MRSA infection nor to guide or monitor treatment for MRSA infections. Performed at [facility], [address] [place], [place] [address]   Culture, blood (Routine X 2) w Reflex to ID Panel     Status: None (Preliminary result)   Collection Time: Jan 03, 2021  8:11 PM   Specimen: BLOOD LEFT HAND  Result Value Ref Range Status   Specimen Description BLOOD LEFT HAND  Final   Special Requests   Final    BOTTLES DRAWN AEROBIC ONLY Blood Culture adequate volume   Culture   Final    NO GROWTH 2 DAYS Performed at [facility],  [address] [place], [place] [address]    Report Status PENDING  Incomplete  Culture, blood (Routine X 2) w Reflex to ID Panel     Status: None (Preliminary result)   Collection Time: Jan 03, 2021  8:21 PM   Specimen: BLOOD LEFT ARM  Result Value Ref Range Status   Specimen Description BLOOD LEFT ARM  Final   Special Requests   Final    BOTTLES DRAWN AEROBIC ONLY Blood Culture adequate volume   Culture   Final    NO GROWTH 2 DAYS Performed at [facility], [address] [place], [place] [address]    Report Status PENDING  Incomplete    Pressure Injury Jan 03, 2021 Ankle Right;Lateral open granulated tissue (Active)  Jan 03, 2021 1515  Location: Ankle  Location Orientation: Right;Lateral  Staging:   Wound Description (Comments): open granulated tissue  Present on Admission: Yes     Pressure Injury Jan 03, 2021 Ischial tuberosity Right reddened (Active)  Jan 03, 2021 1515  Location: Ischial tuberosity  Location Orientation: Right  Staging:   Wound Description (Comments): reddened  Present on Admission: Yes     Pressure Injury Jan 03, 2021 Rib Lateral;Right Large Hematoma, spreading into breast tissue (Active)  Jan 03, 2021 1515  Location: Rib  Location Orientation: Lateral;Right  Staging:   Wound Description (Comments): Large Hematoma, spreading into breast tissue  Present on Admission: Yes          Gagan S Lama   [facility] If 7PM-7AM, please contact night-coverage at [email or web address], Office  [phone]   07/27/2020, 1:55 PM  LOS: 2 days

## 2020-07-28 ENCOUNTER — Encounter (HOSPITAL_COMMUNITY): Payer: Self-pay | Admitting: Internal Medicine

## 2020-07-28 ENCOUNTER — Inpatient Hospital Stay (HOSPITAL_COMMUNITY): Payer: Medicare Other

## 2020-07-28 DIAGNOSIS — Z7189 Other specified counseling: Secondary | ICD-10-CM

## 2020-07-28 DIAGNOSIS — Z515 Encounter for palliative care: Secondary | ICD-10-CM | POA: Diagnosis not present

## 2020-07-28 DIAGNOSIS — N179 Acute kidney failure, unspecified: Secondary | ICD-10-CM | POA: Diagnosis not present

## 2020-07-28 DIAGNOSIS — D62 Acute posthemorrhagic anemia: Secondary | ICD-10-CM | POA: Diagnosis not present

## 2020-07-28 DIAGNOSIS — R4182 Altered mental status, unspecified: Secondary | ICD-10-CM | POA: Diagnosis not present

## 2020-07-28 DIAGNOSIS — I4891 Unspecified atrial fibrillation: Secondary | ICD-10-CM | POA: Diagnosis not present

## 2020-07-28 DIAGNOSIS — D649 Anemia, unspecified: Secondary | ICD-10-CM | POA: Diagnosis not present

## 2020-07-28 DIAGNOSIS — Z7901 Long term (current) use of anticoagulants: Secondary | ICD-10-CM

## 2020-07-28 LAB — COMPREHENSIVE METABOLIC PANEL
ALT: 16 U/L (ref 0–44)
AST: 25 U/L (ref 15–41)
Albumin: 2.1 g/dL — ABNORMAL LOW (ref 3.5–5.0)
Alkaline Phosphatase: 59 U/L (ref 38–126)
Anion gap: 13 (ref 5–15)
BUN: 53 mg/dL — ABNORMAL HIGH (ref 8–23)
CO2: 25 mmol/L (ref 22–32)
Calcium: 6.7 mg/dL — ABNORMAL LOW (ref 8.9–10.3)
Chloride: 103 mmol/L (ref 98–111)
Creatinine, Ser: 3.61 mg/dL — ABNORMAL HIGH (ref 0.44–1.00)
GFR, Estimated: 12 mL/min — ABNORMAL LOW (ref >60)
Glucose, Bld: 99 mg/dL (ref 70–99)
Potassium: 3.5 mmol/L (ref 3.5–5.1)
Sodium: 141 mmol/L (ref 135–145)
Total Bilirubin: 0.7 mg/dL (ref 0.3–1.2)
Total Protein: 5.2 g/dL — ABNORMAL LOW (ref 6.5–8.1)

## 2020-07-28 LAB — URINALYSIS, COMPLETE (UACMP) WITH MICROSCOPIC
Bilirubin Urine: NEGATIVE
Glucose, UA: NEGATIVE mg/dL
Ketones, ur: NEGATIVE mg/dL
Nitrite: NEGATIVE
Protein, ur: 100 mg/dL — AB
Specific Gravity, Urine: 1.009 (ref 1.005–1.030)
pH: 8 (ref 5.0–8.0)

## 2020-07-28 LAB — URINE CULTURE

## 2020-07-28 LAB — CBC
HCT: 22 % — ABNORMAL LOW (ref 36.0–46.0)
Hemoglobin: 7.2 g/dL — ABNORMAL LOW (ref 12.0–15.0)
MCH: 32.3 pg (ref 26.0–34.0)
MCHC: 32.7 g/dL (ref 30.0–36.0)
MCV: 98.7 fL (ref 80.0–100.0)
Platelets: 196 10*3/uL (ref 150–400)
RBC: 2.23 MIL/uL — ABNORMAL LOW (ref 3.87–5.11)
RDW: 15.9 % — ABNORMAL HIGH (ref 11.5–15.5)
WBC: 11.4 10*3/uL — ABNORMAL HIGH (ref 4.0–10.5)
nRBC: 1.6 % — ABNORMAL HIGH (ref 0.0–0.2)

## 2020-07-28 LAB — GLUCOSE, CAPILLARY: Glucose-Capillary: 92 mg/dL (ref 70–99)

## 2020-07-28 MED ORDER — FOLIC ACID 5 MG/ML IJ SOLN
1.0000 mg | Freq: Once | INTRAMUSCULAR | Status: AC
  Administered 2020-07-28: 1 mg via INTRAVENOUS
  Filled 2020-07-28: qty 0.2

## 2020-07-28 MED ORDER — HYDRALAZINE HCL 20 MG/ML IJ SOLN: 10.0000 mg | Freq: Four times a day (QID) | INTRAMUSCULAR | Status: DC | PRN

## 2020-07-28 MED ORDER — SODIUM CHLORIDE 0.9 % IV SOLN: 1.0000 mg | Freq: Once | INTRAVENOUS | Status: DC

## 2020-07-28 NOTE — Progress Notes (Addendum)
PROGRESS NOTE  SRAH AKE NUU:[id number] DOB: January 03, 1929 DOA: 07/22/2020 PCP: [name], NP  Brief History:  87 y.o. female with medical history of dementia, CKD stage III, hypertension, hyperlipidemia, peripheral vascular disease, atrial fibrillation presenting with low hemoglobin.  At baseline, the patient is essentially bedbound/wheelchair bound.  She requires significant assistance for activities of daily living, but she is able to feed herself.  Interestingly, the patient son is the primary caregiver, but the patient's daughter provides much better history. Daughter states that the patient has not been complaining of any chest pain, shortness breath, vomiting, diarrhea, dysuria.  He has not noted any hematochezia, hematuria, hematemesis, epistaxis, vaginal bleeding.  she states that the patient had been recently started on an antibiotic for her leg wound.  There is been no other new medications.  Daughter states that about a week ago, the patient went to see her PCP for routine visit.  There were no able to obtain blood from venipuncture.  As result, home health nurse was sent to the home for INR check and routine blood work.  This was performed on 07/24/2020.  Once the CBC resulted (5.0), EMS was activated by [facility].  Family states that the patient has been eating fairly well at baseline.  Denies any recent trauma to the patient.  However, they suspected the patient has been trying to get out of bed and may have hit the side rails on her hospital bed.  Once the patient arrived to the floor, I was contacted by RN to see the patient secondary to a large hematoma noted on the patient's right thoracic cage area involving her right breast.  Interestingly, the patient's son who is a primary caregiver was not aware of this hematoma.  Also the patient was noted to have hypothermia with temperature of 93.2 F.  Assessment/Plan: Acute blood loss anemia -Per ED provider, FOBT was  negative -due to chest wall hematoma -2 units PRBC ordered -Given vitamin K -Holding Coumadin -Hgb largely stable since transfusion  Acute on chronic renal failure--CKD stage IIIb -Secondary to hemodynamic changes and volume depletion -Baseline hemoglobin 1.5-1.8 -Presented with hemoglobin 4.67 -Renal ultrasound--Nondiagnostic evaluation. Kidneys and bladder could not be identified, and evaluation is limited due to inability to position the patient. -no hydronephrosis on CT abd -patient is not a dialysis candidate  Coagulopathy/supratherapeutic INR -INR 9.5>>1.3 -vitamin K given -hold warfarin -started PPI  Acute metabolic Encephalopathy -repeat UA/culture -MR brain--noted left facial droop -B12--being repleted -replete folate -TSH 1.831  Metabolic acidosis -Started IV bicarbonate>>discontinue 3/28  Right ankle wound -see pictures -wound care consult appreciated  Leukemoid reaction -Check PCT--0.37 -UA and urine culture--unrevealing -Personally reviewed chest x-ray--extremely rotated, question RLL effusion/infiltrate -3/26 CT chest--8.3 x 10.2 x 9.2 cm right chest hematoma likely a intramuscular hematoma centered between the pectoralis major and minor musculature, extending into the deep axilla; bipical scarring;  Mild centrilobular emphysema  Hypothermia -am cortisol 30.2 -blood cultures neg -TSH 1.831 -urine culture unrevealing -improved  Macrocytic anemia -Check B12--129-->started B12 -Check folic acid--6.2  Essential hypertension -continue IV lopressor -continue amlodipine  Dementia without behavioral disturbance -Continue Aricept  Hyperlipidemia -Continue statin  Chronic atrial fibrillation -Holding Coumadin secondary to coagulopathy       Status is: Inpatient  Remains inpatient appropriate because:IV treatments appropriate due to intensity of illness or inability to take PO   Dispo: The patient is from: Home  Anticipated d/c is to: Home              Patient currently is not medically stable to d/c.   Difficult to place patient No        Family Communication:   No Family at bedside  Consultants:  palliative  Code Status:  DNR  DVT Prophylaxis:  SCDs   Procedures: As Listed in Progress Note Above  Antibiotics: Cefepime 3/25>>     Subjective:   Objective: Vitals:   07/28/20 0500 07/28/20 0600 07/28/20 0700 07/28/20 0755  BP: (!) 196/66 (!) 187/71 (!) 179/71   Pulse: 88 78 75   Resp: 20 (!) 31 19   Temp:    97.9 F (36.6 C)  TempSrc:    Axillary  SpO2: 95% 93% 95%   Weight:      Height:        Intake/Output Summary (Last 24 hours) at 07/28/2020 1021 Last data filed at 07/28/2020 0719 Gross per 24 hour  Intake 2595.62 ml  Output 850 ml  Net 1745.62 ml   Weight change:  Exam:   General:  Pt is alert, follows commands appropriately, not in acute distress  HEENT: No icterus, No thrush, No neck mass, [place]/AT  Cardiovascular: RRR, S1/S2, no rubs, no gallops  Respiratory: CTA bilaterally, no wheezing, no crackles, no rhonchi  Abdomen: Soft/+BS, non tender, non distended, no guarding  Extremities: No edema, No lymphangitis, No petechiae, No rashes, no synovitis   Data Reviewed: I have personally reviewed following labs and imaging studies Basic Metabolic Panel: Recent Labs  Lab 07/24/20 1125 07/01/2020 1024 07/26/20 0513 07/27/20 0753 07/28/20 0418  NA 135 138 142 139 141  K 4.1 5.0 3.7 3.4* 3.5  CL 113* 114* 110 106 103  CO2 13* 8* 20* 22 25  GLUCOSE 106* 120* 176* 95 99  BUN 58* 69* 66* 58* 53*  CREATININE 3.70* 4.67* 4.37* 4.05* 3.61*  CALCIUM 7.4* 8.0* 7.4* 6.8* 6.7*   Liver Function Tests: Recent Labs  Lab 07/24/20 1125 07/27/2020 1024 07/27/20 0753 07/28/20 0418  AST 13* 21 27 25   ALT 7 9 17 16   ALKPHOS 52 59 56 59  BILITOT 0.3 0.4 0.6 0.7  PROT 5.1* 5.4* 5.0* 5.2*  ALBUMIN 2.0* 2.2* 2.1* 2.1*   No results for input(s): LIPASE,  AMYLASE in the last 168 hours. No results for input(s): AMMONIA in the last 168 hours. Coagulation Profile: Recent Labs  Lab 07/24/20 1140 07/11/2020 1024 07/26/20 0513  INR 8.0* 9.5* 1.3*   CBC: Recent Labs  Lab 07/24/20 1125 07/06/2020 1024 07/26/20 0513 07/27/20 0753 07/28/20 0418  WBC 8.6 13.5* 14.1* 11.7* 11.4*  NEUTROABS 6.9 11.0*  --   --   --   HGB 5.0* 4.0* 7.6* 7.4* 7.2*  HCT 15.8* 14.1* 22.6* 21.8* 22.0*  MCV 99.4 111.9* 97.0 97.3 98.7  PLT 289 334 251 214 196   Cardiac Enzymes: No results for input(s): CKTOTAL, CKMB, CKMBINDEX, TROPONINI in the last 168 hours. BNP: Invalid input(s): POCBNP CBG: Recent Labs  Lab 07/11/2020 0945  GLUCAP 106*   HbA1C: No results for input(s): HGBA1C in the last 72 hours. Urine analysis:    Component Value Date/Time   COLORURINE AMBER (A) 07/14/2020 1039   APPEARANCEUR CLOUDY (A) 07/23/2020 1039   LABSPEC 1.015 07/08/2020 1039   PHURINE 5.0 07/28/2020 1039   GLUCOSEU NEGATIVE 07/12/2020 1039   HGBUR SMALL (A) 07/29/2020 1039   BILIRUBINUR NEGATIVE 07/05/2020 1039   BILIRUBINUR neg 04/13/2012 1612  KETONESUR NEGATIVE 02-11-21 1039   PROTEINUR >=300 (A) 02-11-21 1039   UROBILINOGEN 0.2 10/04/2012 1514   NITRITE NEGATIVE 02-11-21 1039   LEUKOCYTESUR TRACE (A) 02-11-21 1039   Sepsis Labs: @LABRCNTIP (procalcitonin:4,lacticidven:4) ) Recent Results (from the past 240 hour(s))  Culture, Urine     Status: Abnormal   Collection Time: 2020/06/28 10:35 AM   Specimen: Urine, Clean Catch  Result Value Ref Range Status   Specimen Description   Final    URINE, CLEAN CATCH Performed at [facility], [address] [place], [place] [address]    Special Requests   Final    NONE Performed at [facility], [address] [place], [place] [address]    Culture MULTIPLE SPECIES PRESENT, SUGGEST RECOLLECTION (A)  Final   Report Status 07/28/2020 FINAL  Final  MRSA PCR Screening     Status: None   Collection Time: 2020/06/28   3:37 PM   Specimen: Nasal Mucosa; Nasopharyngeal  Result Value Ref Range Status   MRSA by PCR NEGATIVE NEGATIVE Final    Comment:        The GeneXpert MRSA Assay (FDA approved for NASAL specimens only), is one component of a comprehensive MRSA colonization surveillance program. It is not intended to diagnose MRSA infection nor to guide or monitor treatment for MRSA infections. Performed at [facility], [address] [place], [place] [address]   Culture, blood (Routine X 2) w Reflex to ID Panel     Status: None (Preliminary result)   Collection Time: 2020/06/28  8:11 PM   Specimen: BLOOD LEFT HAND  Result Value Ref Range Status   Specimen Description BLOOD LEFT HAND  Final   Special Requests   Final    BOTTLES DRAWN AEROBIC ONLY Blood Culture adequate volume   Culture   Final    NO GROWTH 3 DAYS Performed at [facility], [address] [place], [place] [address]    Report Status PENDING  Incomplete  Culture, blood (Routine X 2) w Reflex to ID Panel     Status: None (Preliminary result)   Collection Time: 2020/06/28  8:21 PM   Specimen: BLOOD LEFT ARM  Result Value Ref Range Status   Specimen Description BLOOD LEFT ARM  Final   Special Requests   Final    BOTTLES DRAWN AEROBIC ONLY Blood Culture adequate volume   Culture   Final    NO GROWTH 3 DAYS Performed at [facility], [address] [place], [place] [address]    Report Status PENDING  Incomplete     Scheduled Meds: . amLODipine  10 mg Oral Daily  . Chlorhexidine Gluconate Cloth  6 each Topical Daily  . cyanocobalamin  1,000 mcg Subcutaneous Daily  . donepezil  10 mg Oral Daily  . metoprolol tartrate  2.5 mg Intravenous Q6H  . pantoprazole (PROTONIX) IV  40 mg Intravenous Q24H  . pravastatin  10 mg Oral Daily   Continuous Infusions: . sodium chloride    . sodium chloride    . sodium chloride 100 mL/hr at 07/28/20 0719  . ceFEPime (MAXIPIME) IV 200 mL/hr at 07/28/20 0719  . sodium bicarbonate (isotonic) 150  mEq in D5W 1000 mL infusion 75 mL/hr at 07/28/20 [id number]    Procedures/Studies: CT ABDOMEN PELVIS WO CONTRAST  Result Date: 07/26/2020 CLINICAL DATA:  Anemia, supratherapeutic INR, concern for retroperitoneal hematoma EXAM: CT CHEST, ABDOMEN AND PELVIS WITHOUT CONTRAST TECHNIQUE: Multidetector CT imaging of the chest, abdomen and pelvis was performed following the standard protocol without IV  contrast. COMPARISON:  CT chest dated 07/15/2018 FINDINGS: CT CHEST FINDINGS Cardiovascular: Cardiomegaly with left atrial enlargement. No pericardial effusion. No evidence of thoracic aortic aneurysm. Atherosclerotic calcifications of the aortic arch. Coronary atherosclerosis of the LAD and right coronary artery. Mediastinum/Nodes: No suspicious mediastinal lymphadenopathy. Visualized thyroid is unremarkable. Lungs/Pleura: Biapical pleural-parenchymal scarring. Trace chronic right pleural fluid with scarring/volume loss in the right lower lobe, improved. Mild centrilobular emphysematous changes in the upper lobes. Mild subpleural reticulation in the lungs bilaterally. Evaluation lung parenchyma is constrained by respiratory motion. Within that constraint, there are no suspicious pulmonary nodules. No pneumothorax. Musculoskeletal: 8.3 x 10.2 x 9.2 cm right chest hematoma (series 3/image 8), likely a intramuscular hematoma centered between the pectoralis major and minor musculature, extending into the deep axilla. Surrounding fluid/chest wall edema extending inferiorly along the dependent right anterior abdominal wall (series 3/image 44). Mild degenerative changes of the visualized thoracolumbar spine. Moderate superior endplate compression fracture deformity at T12, chronic. No retropulsion. CT ABDOMEN PELVIS FINDINGS Hepatobiliary: Unenhanced liver is unremarkable. Gallbladder is grossly unremarkable. No intrahepatic or extrahepatic ductal dilatation. Pancreas: Grossly unremarkable. Spleen: Within normal limits.  Adrenals/Urinary Tract: Adrenal glands are within normal limits. Kidneys are within normal limits. No renal calculi or hydronephrosis. Bladder is within normal limits. Stomach/Bowel: Stomach is within normal limits. No evidence of bowel obstruction. Appendix is not discretely visualized. Sigmoid diverticulosis, without evidence of diverticulitis. Vascular/Lymphatic: No evidence of abdominal aortic aneurysm. Atherosclerotic calcifications of the abdominal aorta and branch vessels. No suspicious abdominopelvic lymphadenopathy. Reproductive: Suspected prior hysterectomy. No adnexal masses. Other: No abdominopelvic ascites. No free air. Musculoskeletal: Degenerative changes of the lumbar spine. IMPRESSION: 10.2 cm intramuscular hematoma in the right chest wall, as described above, likely secondary to the patient's supratherapeutic INR. Additional chronic ancillary findings, as described above. Aortic Atherosclerosis (ICD10-I70.0) and Emphysema (ICD10-J43.9). Electronically Signed   By: Charline Bills M.D.   On: 07/26/2020 06:11   CT Head Wo Contrast  Result Date: 07/01/2020 CLINICAL DATA:  Altered mental status EXAM: CT HEAD WITHOUT CONTRAST TECHNIQUE: Contiguous axial images were obtained from the base of the skull through the vertex without intravenous contrast. COMPARISON:  None. FINDINGS: Brain: No evidence of acute infarction, hemorrhage, hydrocephalus, extra-axial collection or mass lesion/mass effect. Advanced low-density changes within the periventricular and subcortical white matter compatible with chronic microvascular ischemic change. Moderate diffuse cerebral volume loss. Vascular: Atherosclerotic calcifications involving the large vessels of the skull base. No unexpected hyperdense vessel. Skull: Normal. Negative for fracture or focal lesion. Sinuses/Orbits: Complete opacification of the bilateral mastoid air cells. Right maxillary mucous retention cyst. Other: None. IMPRESSION: 1. No acute  intracranial findings. 2. Advanced chronic microvascular ischemic change and cerebral volume loss. 3. Complete opacification of the bilateral mastoid air cells. Electronically Signed   By: Duanne Guess D.O.   On:  12:40   CT CHEST WO CONTRAST  Result Date: 07/26/2020 CLINICAL DATA:  Anemia, supratherapeutic INR, concern for retroperitoneal hematoma EXAM: CT CHEST, ABDOMEN AND PELVIS WITHOUT CONTRAST TECHNIQUE: Multidetector CT imaging of the chest, abdomen and pelvis was performed following the standard protocol without IV contrast. COMPARISON:  CT chest dated 07/15/2018 FINDINGS: CT CHEST FINDINGS Cardiovascular: Cardiomegaly with left atrial enlargement. No pericardial effusion. No evidence of thoracic aortic aneurysm. Atherosclerotic calcifications of the aortic arch. Coronary atherosclerosis of the LAD and right coronary artery. Mediastinum/Nodes: No suspicious mediastinal lymphadenopathy. Visualized thyroid is unremarkable. Lungs/Pleura: Biapical pleural-parenchymal scarring. Trace chronic right pleural fluid with scarring/volume loss in the right lower lobe, improved. Mild centrilobular  emphysematous changes in the upper lobes. Mild subpleural reticulation in the lungs bilaterally. Evaluation lung parenchyma is constrained by respiratory motion. Within that constraint, there are no suspicious pulmonary nodules. No pneumothorax. Musculoskeletal: 8.3 x 10.2 x 9.2 cm right chest hematoma (series 3/image 8), likely a intramuscular hematoma centered between the pectoralis major and minor musculature, extending into the deep axilla. Surrounding fluid/chest wall edema extending inferiorly along the dependent right anterior abdominal wall (series 3/image 44). Mild degenerative changes of the visualized thoracolumbar spine. Moderate superior endplate compression fracture deformity at T12, chronic. No retropulsion. CT ABDOMEN PELVIS FINDINGS Hepatobiliary: Unenhanced liver is unremarkable. Gallbladder  is grossly unremarkable. No intrahepatic or extrahepatic ductal dilatation. Pancreas: Grossly unremarkable. Spleen: Within normal limits. Adrenals/Urinary Tract: Adrenal glands are within normal limits. Kidneys are within normal limits. No renal calculi or hydronephrosis. Bladder is within normal limits. Stomach/Bowel: Stomach is within normal limits. No evidence of bowel obstruction. Appendix is not discretely visualized. Sigmoid diverticulosis, without evidence of diverticulitis. Vascular/Lymphatic: No evidence of abdominal aortic aneurysm. Atherosclerotic calcifications of the abdominal aorta and branch vessels. No suspicious abdominopelvic lymphadenopathy. Reproductive: Suspected prior hysterectomy. No adnexal masses. Other: No abdominopelvic ascites. No free air. Musculoskeletal: Degenerative changes of the lumbar spine. IMPRESSION: 10.2 cm intramuscular hematoma in the right chest wall, as described above, likely secondary to the patient's supratherapeutic INR. Additional chronic ancillary findings, as described above. Aortic Atherosclerosis (ICD10-I70.0) and Emphysema (ICD10-J43.9). Electronically Signed   By: Charline Bills M.D.   On: 07/26/2020 06:11   US RENAL  Result Date: August 23, 2020 CLINICAL DATA:  Acute on chronic renal failure, tobacco abuse, chronic renal insufficiency EXAM: RENAL / URINARY TRACT ULTRASOUND COMPLETE COMPARISON:  None. FINDINGS: Right Kidney: Not visualized. Limited by patient cooperation and inability to position the patient. Left Kidney: Not visualized. Limited by patient cooperation and inability to position the patient. Bladder: Not visualized. Limited by patient cooperation and inability to position patient. Other: None. IMPRESSION: 1. Nondiagnostic evaluation. Kidneys and bladder could not be identified, and evaluation is limited due to inability to position the patient. Electronically Signed   By: Sharlet Salina M.D.   On: 08-23-2020 15:15   DG Chest Portable 1  View  Result Date: 08-23-2020 CLINICAL DATA:  Altered mental status EXAM: PORTABLE CHEST 1 VIEW COMPARISON:  Chest CT from July 15, 2018 FINDINGS: Limited rotated chest. Asymmetric density at the right base with costophrenic sulcus blunting. No edema or pneumothorax. Large volume lungs in general. IMPRESSION: 1. Limited rotated chest. 2. Small right pleural effusion which could be chronic or related to scarring based on a 2020 chest CT. Electronically Signed   By: Marnee Spring M.D.   On: 08/23/2020 10:48    Catarina Hartshorn, DO  [facility]  If 7PM-7AM, please contact night-coverage [email or web address] Password [email or web address] 07/28/2020, 10:21 AM   LOS: 3 days

## 2020-07-28 NOTE — Consult Note (Signed)
[facility] Rashidi Loh A. Gerilyn Pilgrimoonquah, MD     [email or web address]          Hewitt ShortsJessie M [name] is an 87 y.o. female.   ASSESSMENT/PLAN: 1. Worsening encephalopathy on baseline severe dementia:   This cephalopathy is multifactorial including bilateral ischemic strokes due to cardioembolic event from atrial fibrillation. Overall prognosis is grim. Given the age and the at multiple comorbidities, the most compassionate thing is comfort care which I recommend.     This 87 year old black female who was found to have severe anemia on routine tested by his primary care provider with a hemoglobin of  5. This prompted urgent admission.  The patient is on Coumadin with supratherapeutic INR 9 which is thought to be the source of his anemia particularly also with chest wall hematoma. The warfarin has been discontinued and reversed. The patient also noted to have hypersomnia although he has baseline advanced dementia. Imaging shows evidence of stroke and hence a neurological consultation. At baseline the patient does have cognitive impairment and requires assistance with daily activities and essentially is wheelchair bound/ bed-bound.  The nurse reports that the patient was more responsive during her initial admission. She would not follow commands but she would speak to leave her alone and would try to hit the nurses at times.  She will move all 4 extremities. Speech was mostly nonsensical. Now she is mostly unresponsive.    GENERAL:  She lays in bed mostly unresponsive.  HEENT:  She has dysconjugate gaze with right exotropia. No trauma noted.   THORAX:  There is a large hematoma involving the right thoracic.  ABDOMEN: soft  EXTREMITIES:  Marked swelling of the left upper extremity. There is contracture of the legs as quite significant.  BACK:  Unremarkable  SKIN: Normal by inspection.    MENTAL STATUS:  Eyes are open but the patient does not focus is tracks or follow commands. There is no verbal  output.  CRANIAL NERVES: Pupils are equal, round and reactive to light; extra ocular movements are full, there is no significant nystagmus; visual fields diminished to direct threat bilaterally; upper and lower facial muscles are normal in strength and symmetric, there is no flattening of the nasolabial folds  MOTOR:  Minimal movement to painful stimuli bilaterally.  COORDINATION:  No tremors are noted. There is no dysmetria myoclonus or parkinsonian features.  REFLEXES: Deep tendon reflexes are symmetrical and normal.   SENSATION:  Reduced  Response to painful stimuli.   Blood pressure (!) 194/61, pulse 72, temperature 98.7 F (37.1 C), temperature source Axillary, resp. rate 12, height 5\' 1"  (1.549 m), weight 48.7 kg, SpO2 98 %.  Past Medical History:  Diagnosis Date  . Anemia    Hemoglobin of 11.6 in 01/2010; 12.1 in 08/2010  . Atrial fibrillation (HCC)    chronic requiring anticoagulation  . Chronic anticoagulation   . Chronic kidney disease    Stage III; creatinine of 1.7 in 04/2007 and 1.5 2009; 1.7 in 04/2010  . Hyperlipidemia    Total cholesterol of 226, triglycerides of 90, HDL of 49 and LDL of 159 in 01/2010  . Hypertension     Past Surgical History:  Procedure Laterality Date  . APPENDECTOMY    . BUNIONECTOMY  1995  . PARTIAL HYSTERECTOMY      Family History  Problem Relation Age of Onset  . Diabetes Mother   . Heart disease Father   . Diabetes Sister   . Diabetes Brother     Social History:  reports that she has never smoked. She has never used smokeless tobacco. She reports that she does not drink alcohol and does not use drugs.  Allergies: No Known Allergies  Medications: Prior to Admission medications   Medication Sig Start Date End Date Taking? Authorizing Provider  acetaminophen (TYLENOL) 500 MG tablet Take 1 tablet (500 mg total) by mouth every 8 (eight) hours. 07/17/20  Yes Ngetich, Dinah C, NP  amLODipine (NORVASC) 5 MG tablet Take 5 mg by mouth  daily.   Yes [provider]  donepezil (ARICEPT) 10 MG tablet Take 1 tablet by mouth once daily with breakfast 05/07/20  Yes [place], Velna Hatchet, MD  doxycycline (VIBRA-TABS) 100 MG tablet Take 1 tablet (100 mg total) by mouth 2 (two) times daily. 07/17/20  Yes Ngetich, Dinah C, NP  famotidine (PEPCID) 40 MG tablet Take 0.5 tablets (20 mg total) by mouth 2 (two) times daily. 04/18/20  Yes [place], Velna Hatchet, MD  hydrOXYzine (ATARAX/VISTARIL) 10 MG tablet Take 1 tablet (10 mg total) by mouth 3 (three) times daily as needed for itching. 04/23/20  Yes Valentino Nose, NP  LORazepam (ATIVAN) 1 MG tablet TAKE 1 TABLET BY MOUTH AT BEDTIME AS NEEDED FOR ANXIETY AND  AGITATION 05/07/20  Yes [place], Velna Hatchet, MD  metoprolol tartrate (LOPRESSOR) 50 MG tablet Take 50 mg by mouth 2 (two) times daily.   Yes [provider]  omeprazole (PRILOSEC) 40 MG capsule Take 1 capsule by mouth once daily 05/07/20  Yes [place], Velna Hatchet, MD  pravastatin (PRAVACHOL) 10 MG tablet Take 1 tablet (10 mg total) by mouth daily. 05/22/19  Yes [place], Velna Hatchet, MD  saccharomyces boulardii (FLORASTOR) 250 MG capsule Take 1 capsule (250 mg total) by mouth 2 (two) times daily. 07/17/20  Yes Ngetich, Dinah C, NP  traZODone (DESYREL) 50 MG tablet Take 0.5-1 tablets (25-50 mg total) by mouth at bedtime as needed for sleep. 07/17/20  Yes Ngetich, Dinah C, NP  warfarin (COUMADIN) 2.5 MG tablet TAKE AS DIRECTED BY COUMADIN CLINIC 12/10/19  Yes Netta Neat., NP  warfarin (COUMADIN) 4 MG tablet Take 4 mg by mouth. Monday,Wednesday and Friday along with coumadin 2.5 mg tablet ( Total 6.5 mg Tablet ).   Yes [provider]    Scheduled Meds: . amLODipine  10 mg Oral Daily  . Chlorhexidine Gluconate Cloth  6 each Topical Daily  . cyanocobalamin  1,000 mcg Subcutaneous Daily  . donepezil  10 mg Oral Daily  . metoprolol tartrate  2.5 mg Intravenous Q6H  . pantoprazole (PROTONIX) IV  40 mg Intravenous Q24H  .  pravastatin  10 mg Oral Daily   Continuous Infusions: . sodium chloride    . sodium chloride    . sodium chloride 100 mL/hr at 07/28/20 1340  . sodium bicarbonate (isotonic) 150 mEq in D5W 1000 mL infusion 75 mL/hr at 07/28/20 1341   PRN Meds:.acetaminophen **OR** acetaminophen, hydrALAZINE, ondansetron **OR** ondansetron (ZOFRAN) IV, traZODone     Results for orders placed or performed during the hospital encounter of 07/26/2020 (from the past 48 hour(s))  CBC     Status: Abnormal   Collection Time: 07/27/20  7:53 AM  Result Value Ref Range   WBC 11.7 (H) 4.0 - 10.5 K/uL   RBC 2.24 (L) 3.87 - 5.11 MIL/uL   Hemoglobin 7.4 (L) 12.0 - 15.0 g/dL   HCT 42.3 (L) 53.6 - 14.4 %   MCV 97.3 80.0 - 100.0 fL   MCH 33.0 26.0 -  34.0 pg   MCHC 33.9 30.0 - 36.0 g/dL   RDW 34.1 (H) 93.7 - 90.2 %   Platelets 214 150 - 400 K/uL   nRBC 1.5 (H) 0.0 - 0.2 %    Comment: Performed at [facility], [address] [place], [place] [address]  Comprehensive metabolic panel     Status: Abnormal   Collection Time: 07/27/20  7:53 AM  Result Value Ref Range   Sodium 139 135 - 145 mmol/L   Potassium 3.4 (L) 3.5 - 5.1 mmol/L   Chloride 106 98 - 111 mmol/L   CO2 22 22 - 32 mmol/L   Glucose, Bld 95 70 - 99 mg/dL    Comment: Glucose reference range applies only to samples taken after fasting for at least 8 hours.   BUN 58 (H) 8 - 23 mg/dL   Creatinine, Ser 5.32 (H) 0.44 - 1.00 mg/dL   Calcium 6.8 (L) 8.9 - 10.3 mg/dL   Total Protein 5.0 (L) 6.5 - 8.1 g/dL   Albumin 2.1 (L) 3.5 - 5.0 g/dL   AST 27 15 - 41 U/L   ALT 17 0 - 44 U/L   Alkaline Phosphatase 56 38 - 126 U/L   Total Bilirubin 0.6 0.3 - 1.2 mg/dL   GFR, Estimated 10 (L) >60 mL/min    Comment: (NOTE) Calculated using the CKD-EPI Creatinine Equation (2021)    Anion gap 11 5 - 15    Comment: Performed at [facility], [address], [place], [place] [address]  Creatinine, urine, random     Status: None   Collection Time: 07/27/20  7:18 PM  Result  Value Ref Range   Creatinine, Urine 70.61 mg/dL    Comment: Performed at [facility], [address], [place], [place] [address]  Sodium, urine, random     Status: None   Collection Time: 07/27/20  7:18 PM  Result Value Ref Range   Sodium, Ur 69 mmol/L    Comment: Performed at [facility], [address], [place], [place] [address]  CBC     Status: Abnormal   Collection Time: 07/28/20  4:18 AM  Result Value Ref Range   WBC 11.4 (H) 4.0 - 10.5 K/uL   RBC 2.23 (L) 3.87 - 5.11 MIL/uL   Hemoglobin 7.2 (L) 12.0 - 15.0 g/dL   HCT 97.9 (L) 89.2 - 11.9 %   MCV 98.7 80.0 - 100.0 fL   MCH 32.3 26.0 - 34.0 pg   MCHC 32.7 30.0 - 36.0 g/dL   RDW 41.7 (H) 40.8 - 14.4 %   Platelets 196 150 - 400 K/uL   nRBC 1.6 (H) 0.0 - 0.2 %    Comment: Performed at [facility], [address] [place], [place] [address]  Comprehensive metabolic panel     Status: Abnormal   Collection Time: 07/28/20  4:18 AM  Result Value Ref Range   Sodium 141 135 - 145 mmol/L   Potassium 3.5 3.5 - 5.1 mmol/L   Chloride 103 98 - 111 mmol/L   CO2 25 22 - 32 mmol/L   Glucose, Bld 99 70 - 99 mg/dL    Comment: Glucose reference range applies only to samples taken after fasting for at least 8 hours.   BUN 53 (H) 8 - 23 mg/dL   Creatinine, Ser 3.14 (H) 0.44 - 1.00 mg/dL   Calcium 6.7 (L) 8.9 - 10.3 mg/dL   Total Protein 5.2 (L) 6.5 - 8.1 g/dL   Albumin 2.1 (L) 3.5 - 5.0  g/dL   AST 25 15 - 41 U/L   ALT 16 0 - 44 U/L   Alkaline Phosphatase 59 38 - 126 U/L   Total Bilirubin 0.7 0.3 - 1.2 mg/dL   GFR, Estimated 12 (L) >60 mL/min    Comment: (NOTE) Calculated using the CKD-EPI Creatinine Equation (2021)    Anion gap 13 5 - 15    Comment: Performed at [facility], [address], [place], [place] [address]    Studies/Results:      Brain MRI MRA FINDINGS: MRI HEAD FINDINGS  Motion limited study.  Brain: Confluent restricted diffusion in the right parieto-occipital region, which appears to predominantly involve  white matter. Multiple bilateral punctate acute infarcts involving bilateral frontal and parietal lobes (both cortex and white matter). Multiple punctate infarcts involving the inferior cerebellum bilaterally (series 5, image 5 and series 7, image 6 and 8). No substantial mass effect. No evidence of acute hemorrhage. Advanced scattered T2/FLAIR hyperintensity within the white matter and pons, most likely related to chronic microvascular ischemic disease. No evidence of mass lesion. No midline shift. Generalized cerebral volume loss with ex vacuo ventricular dilation. No hydrocephalus.  Vascular: Major arterial flow voids are maintained at the skull base.  Skull and upper cervical spine: Normal marrow signal.  Sinuses/Orbits: Inferior right maxillary sinus mucosal cyst. Otherwise, sinuses are largely clear. Unremarkable orbits.  Other: Bilateral mastoid effusions.  MRA HEAD FINDINGS  Anterior circulation: Patent bilateral ICAs. Patent MCAs and ACAs with limited evaluation for stenosis or aneurysm due to motion  Posterior circulation: Intradural vertebral arteries are partially imaged. The right vertebral artery appears to largely terminate as PICA. The visualized intradural left vertebral artery is patent. The basilar artery and proximal bilateral posterior cerebral arteries are patent. Suspected moderate stenosis of the left P2 PCA  IMPRESSION: MRI head:  1. Motion limited study with evidence of acute confluent right parieto-occipital white matter infarct and multiple additional punctate acute infarcts in bilateral frontoparietal lobes and inferior cerebellum. Given the distribution, infarcts may be embolic and/or watershed in etiology. No substantial mass effect. 2. Advanced chronic microvascular ischemic disease. 3. Bilateral mastoid effusions.  MRA:  1. Motion limited evaluation without evidence of emergent large vessel occlusion. 2. Possible moderate  stenosis of the left P2 PCA. Otherwise, limited evaluation for stenosis (particularly in the anterior circulation) given motion. Follow-up CTA could provide more sensitive evaluation if clinically indicated.        Niyanna Asch A. Gerilyn Pilgrim, M.D.  Diplomate, Biomedical engineer of Psychiatry and Neurology ( Neurology). 07/28/2020, 5:50 PM

## 2020-07-28 NOTE — Consult Note (Signed)
Consultation Note Date: 07/28/2020   Patient Name: [name]  DOB: [birth year]  MRN: [id number]  Age / Sex: 87 y.o., female  PCP: Ngetich, Nelda Bucks, NP Referring Physician: Orson Eva, MD  Reason for Consultation: Establishing goals of care and Psychosocial/spiritual support  HPI/Patient Profile: 87 y.o. female  with past medical history of dementia, CKD 3, HTN/HLD, PVD, A. fib on chronic anticoagulation, contracture/bedbound/nonambulatory admitted on 07/16/2020 with acute blood loss anemia, acute on chronic renal failure, acute stroke.   Clinical Assessment and Goals of Care: I briefly see Mrs. Stfleur earlier in the day.  She appears acutely/chronically ill and frail, contracted.  She does not make eye contact or try to interact with me in any meaningful way.  There is no family at bedside at this time.  I have reviewed medical records including EPIC notes, labs and imaging, received report from attending and bedside nursing staff, examined the patient and met at bedside with daughter, Orma Render to discuss diagnosis prognosis, [place], EOL wishes, disposition and options.  I introduced Palliative Medicine as specialized medical care for people living with serious illness. It focuses on providing relief from the symptoms and stress of a serious illness.   We discussed a brief life review of the patient.  Daughter shares that Mrs. Fennel was never married, she lived with her parents until they died.  Mrs. Witte had 3 adult children, but one of her daughters died 10-17-19.  Mrs. Dulski son lives in her home.  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  As far as functional and nutritional status, Mrs. Rocque has had dementia for "several years".  Daughter shares that they get her up, get her dressed and put her in recliner for most of the day.  She has a CAP aide from 10:30-2 30 who returns at 7:30 in the evening to get her to  bed.  Daughter shares that Mrs. Ernest is incontinent, and if she needs to be cleaned between these times, she is usually in and out.  We discussed her current illness and what it means in the larger context of her on-going co-morbidities.  Natural disease trajectory and expectations at EOL were discussed.  We talked in detail about Mrs. Mcfayden acute stroke.  Jolayne Haines shares her experience with her friend who had strokes and who has since died.  I share that we must eat to live, and Renessa states that she does need to eat to live.  I share that some families choose a tube to feed their loved ones, but she quickly states that her mother would not want that and they would not use a tube to feed her.    I attempted to elicit values and goals of care important to the patient.    Hospice and Palliative Care services outpatient were explained and offered.  I share that I understand that family would want Mrs. Seif to return to her own home, and her daughter agrees.  We talked about at home hospice care.  Daughter is familiar with what is and is not provided with hospice and is agreeable to at home hospice care at this time.  Questions and concerns were addressed.  The family was encouraged to call with questions or concerns.   Conference with attending, bedside nursing staff, transition of care team related to patient condition, needs, goals of care. PMT to follow-up tomorrow.  HCPOA   NEXT OF KIN -adult children, daughter Gracy Bruins and son who lives in her home.     SUMMARY OF RECOMMENDATIONS   At this point continue to treat the treatable but no CPR or intubation Time for outcomes Agreeable to home with hospice care   Code Status/Advance Care Planning:  DNR  Symptom Management:   Per hospitalist, no additional needs at this time.  Palliative Prophylaxis:   Frequent Pain Assessment, Oral Care, Palliative Wound Care and Turn Reposition  Additional Recommendations (Limitations, Scope,  Preferences):  Continue to treat the treatable but no CPR or intubation.  Psycho-social/Spiritual:   Desire for further Chaplaincy support:yes  Additional Recommendations: Caregiving  Support/Resources and Education on Hospice  Prognosis:   Unable to determine, based on outcomes.  If Mrs. Starkel is unable to take in adequate nutrition then weeks is anticipated.  Discharge Planning: To be determined, based on outcomes.  At this point family is agreeable to home with hospice care, but not full comfort care at this time      Primary Diagnoses: Present on Admission: . Acute blood loss anemia . ATRIAL FIBRILLATION, CHRONIC   I have reviewed the medical record, interviewed the patient and family, and examined the patient. The following aspects are pertinent.  Past Medical History:  Diagnosis Date  . Anemia    Hemoglobin of 11.6 in 01/2010; 12.1 in 08/2010  . Atrial fibrillation ([place])    chronic requiring anticoagulation  . Chronic anticoagulation   . Chronic kidney disease    Stage III; creatinine of 1.7 in 04/2007 and 1.5 2009; 1.7 in 04/2010  . Hyperlipidemia    Total cholesterol of 226, triglycerides of 90, HDL of 49 and LDL of 159 in 01/2010  . Hypertension    Social History   Socioeconomic History  . Marital status: Single    Spouse name: Not on file  . Number of children: Not on file  . Years of education: Not on file  . Highest education level: Not on file  Occupational History  . Occupation: Retired    Comment: CNA at a local SNF  Tobacco Use  . Smoking status: Never Smoker  . Smokeless tobacco: Never Used  Vaping Use  . Vaping Use: Never used  Substance and Sexual Activity  . Alcohol use: No    Alcohol/week: 0.0 standard drinks  . Drug use: No  . Sexual activity: Not Currently  Other Topics Concern  . Not on file  Social History Narrative  . Not on file   Social Determinants of Health   Financial Resource Strain: Not on file  Food Insecurity: Not on  file  Transportation Needs: Not on file  Physical Activity: Not on file  Stress: Not on file  Social Connections: Not on file   Family History  Problem Relation Age of Onset  . Diabetes Mother   . Heart disease Father   . Diabetes Sister   . Diabetes Brother    Scheduled Meds: . amLODipine  10 mg Oral Daily  . Chlorhexidine Gluconate Cloth  6 each Topical Daily  . cyanocobalamin  1,000 mcg  Subcutaneous Daily  . donepezil  10 mg Oral Daily  . metoprolol tartrate  2.5 mg Intravenous Q6H  . pantoprazole (PROTONIX) IV  40 mg Intravenous Q24H  . pravastatin  10 mg Oral Daily   Continuous Infusions: . sodium chloride    . sodium chloride    . sodium chloride 100 mL/hr at 07/28/20 1340  . sodium bicarbonate (isotonic) 150 mEq in D5W 1000 mL infusion 75 mL/hr at 07/28/20 1341   PRN Meds:.acetaminophen **OR** acetaminophen, hydrALAZINE, ondansetron **OR** ondansetron (ZOFRAN) IV, traZODone Medications Prior to Admission:  Prior to Admission medications   Medication Sig Start Date End Date Taking? Authorizing Provider  acetaminophen (TYLENOL) 500 MG tablet Take 1 tablet (500 mg total) by mouth every 8 (eight) hours. 07/17/20  Yes Ngetich, Dinah C, NP  amLODipine (NORVASC) 5 MG tablet Take 5 mg by mouth daily.   Yes [provider]  donepezil (ARICEPT) 10 MG tablet Take 1 tablet by mouth once daily with breakfast 05/07/20  Yes [place], Modena Nunnery, MD  doxycycline (VIBRA-TABS) 100 MG tablet Take 1 tablet (100 mg total) by mouth 2 (two) times daily. 07/17/20  Yes Ngetich, Dinah C, NP  famotidine (PEPCID) 40 MG tablet Take 0.5 tablets (20 mg total) by mouth 2 (two) times daily. 04/18/20  Yes [place], Modena Nunnery, MD  hydrOXYzine (ATARAX/VISTARIL) 10 MG tablet Take 1 tablet (10 mg total) by mouth 3 (three) times daily as needed for itching. 04/23/20  Yes Eulogio Bear, NP  LORazepam (ATIVAN) 1 MG tablet TAKE 1 TABLET BY MOUTH AT BEDTIME AS NEEDED FOR ANXIETY AND  AGITATION 05/07/20  Yes  [place], Modena Nunnery, MD  metoprolol tartrate (LOPRESSOR) 50 MG tablet Take 50 mg by mouth 2 (two) times daily.   Yes [provider]  omeprazole (PRILOSEC) 40 MG capsule Take 1 capsule by mouth once daily 05/07/20  Yes [place], Modena Nunnery, MD  pravastatin (PRAVACHOL) 10 MG tablet Take 1 tablet (10 mg total) by mouth daily. 05/22/19  Yes [place], Modena Nunnery, MD  saccharomyces boulardii (FLORASTOR) 250 MG capsule Take 1 capsule (250 mg total) by mouth 2 (two) times daily. 07/17/20  Yes Ngetich, Dinah C, NP  traZODone (DESYREL) 50 MG tablet Take 0.5-1 tablets (25-50 mg total) by mouth at bedtime as needed for sleep. 07/17/20  Yes Ngetich, Dinah C, NP  warfarin (COUMADIN) 2.5 MG tablet TAKE AS DIRECTED BY COUMADIN CLINIC 12/10/19  Yes Verta Ellen., NP  warfarin (COUMADIN) 4 MG tablet Take 4 mg by mouth. Monday,Wednesday and Friday along with coumadin 2.5 mg tablet ( Total 6.5 mg Tablet ).   Yes [provider]   No Known Allergies Review of Systems  Unable to perform ROS: Dementia    Physical Exam Vitals and nursing note reviewed.  Constitutional:      General: She is not in acute distress.    Appearance: She is ill-appearing.  HENT:     Head: Atraumatic.  Cardiovascular:     Rate and Rhythm: Normal rate.  Pulmonary:     Effort: Pulmonary effort is normal. No respiratory distress.  Musculoskeletal:     Comments: Contracted, fetal position  Skin:    General: Skin is warm and dry.     Findings: Bruising present.     Comments: Wounds as noted by nursing  Neurological:     Comments: Known dementia  Psychiatric:     Comments: Calm and cooperative     Vital Signs: BP (!) 194/61 (BP Location: Left Arm)  Pulse 72   Temp 98.3 F (36.8 C) (Axillary)   Resp 12   Ht '5\' 1"'  (1.549 m)   Wt 48.7 kg   SpO2 98%   BMI 20.29 kg/m  Pain Scale: PAINAD   Pain Score: Asleep   SpO2: SpO2: 98 % O2 Device:SpO2: 98 % O2 Flow Rate: .O2 Flow Rate (L/min): 2 L/min  IO:  Intake/output summary:   Intake/Output Summary (Last 24 hours) at 07/28/2020 1605 Last data filed at 07/28/2020 0719 Gross per 24 hour  Intake 2595.62 ml  Output 850 ml  Net 1745.62 ml    LBM: Last BM Date: 07/27/20 Baseline Weight: Weight: 47.2 kg Most recent weight: Weight: 48.7 kg     Palliative Assessment/Data:   Flowsheet Rows   Flowsheet Row Most Recent Value  Intake Tab   Referral Department Hospitalist  Unit at Time of Referral Intermediate Care Unit  Palliative Care Primary Diagnosis Neurology  Date Notified 07/28/20  Palliative Care Type New Palliative care  Reason for referral Clarify Goals of Care  Date of Admission 07/10/2020  Date first seen by Palliative Care 07/28/20  # of days Palliative referral response time 0 Day(s)  # of days IP prior to Palliative referral 3  Clinical Assessment   Palliative Performance Scale Score 10%  Pain Max last 24 hours Not able to report  Pain Min Last 24 hours Not able to report  Dyspnea Max Last 24 Hours Not able to report  Dyspnea Min Last 24 hours Not able to report  Psychosocial & Spiritual Assessment   Palliative Care Outcomes       Time In: 1420 Time Out: 1530 Time Total: 70 minutes  Greater than 50%  of this time was spent counseling and coordinating care related to the above assessment and plan.  Signed by: Drue Novel, NP   Please contact Palliative Medicine Team phone at [phone] for questions and concerns.  For individual provider: See Shea Evans

## 2020-07-28 NOTE — Progress Notes (Signed)
Patient ordered to get MRI. Temp foley had to be removed and fluids stopped temporarily. Will resume after MRI. Transport on the way to get patient. Patient still disoriented and not following commands no changes from previous note. Will continue to monitor.

## 2020-07-28 NOTE — Progress Notes (Signed)
Patient back on floor. Moved to ICU bed 09 due to maintenance needed to ICU room 01. Patient settled, foley reinserted, fluids restarted. MD at bedside talking to daughter about patient status. Awaiting results from MRI. Will continue to monitor.

## 2020-07-28 NOTE — Progress Notes (Signed)
Patient taken down to MRI

## 2020-07-28 NOTE — Progress Notes (Signed)
Upon assessment this morning patient's mental status has changed. Patient has new onset right sided weakness. Right arm is flaccid, delayed withdrawal from pain and decreased sensation in her right leg. Delayed/ slow response to threat right sided vision ocular response when tested. Slurred speech. No facial droop. MD notified. MRI of brain ordered WO contrast. Will continue to monitor for further changes.

## 2020-07-29 ENCOUNTER — Ambulatory Visit: Payer: Medicare Other

## 2020-07-29 ENCOUNTER — Inpatient Hospital Stay (HOSPITAL_COMMUNITY): Payer: Medicare Other

## 2020-07-29 ENCOUNTER — Other Ambulatory Visit (HOSPITAL_COMMUNITY): Payer: Medicare Other

## 2020-07-29 DIAGNOSIS — I639 Cerebral infarction, unspecified: Secondary | ICD-10-CM

## 2020-07-29 DIAGNOSIS — I4891 Unspecified atrial fibrillation: Secondary | ICD-10-CM | POA: Diagnosis not present

## 2020-07-29 DIAGNOSIS — D649 Anemia, unspecified: Secondary | ICD-10-CM | POA: Diagnosis not present

## 2020-07-29 DIAGNOSIS — D62 Acute posthemorrhagic anemia: Secondary | ICD-10-CM | POA: Diagnosis not present

## 2020-07-29 DIAGNOSIS — Z7189 Other specified counseling: Secondary | ICD-10-CM | POA: Diagnosis not present

## 2020-07-29 DIAGNOSIS — I482 Chronic atrial fibrillation, unspecified: Secondary | ICD-10-CM | POA: Diagnosis not present

## 2020-07-29 DIAGNOSIS — D689 Coagulation defect, unspecified: Secondary | ICD-10-CM | POA: Diagnosis not present

## 2020-07-29 LAB — TYPE AND SCREEN
ABO/RH(D): B POS
Antibody Screen: NEGATIVE
Unit division: 0
Unit division: 0
Unit division: 0

## 2020-07-29 LAB — CBC
HCT: 24.2 % — ABNORMAL LOW (ref 36.0–46.0)
Hemoglobin: 7.7 g/dL — ABNORMAL LOW (ref 12.0–15.0)
MCH: 31.8 pg (ref 26.0–34.0)
MCHC: 31.8 g/dL (ref 30.0–36.0)
MCV: 100 fL (ref 80.0–100.0)
Platelets: 174 10*3/uL (ref 150–400)
RBC: 2.42 MIL/uL — ABNORMAL LOW (ref 3.87–5.11)
RDW: 16.1 % — ABNORMAL HIGH (ref 11.5–15.5)
WBC: 11.1 10*3/uL — ABNORMAL HIGH (ref 4.0–10.5)
nRBC: 0.5 % — ABNORMAL HIGH (ref 0.0–0.2)

## 2020-07-29 LAB — COMPREHENSIVE METABOLIC PANEL
ALT: 16 U/L (ref 0–44)
AST: 27 U/L (ref 15–41)
Albumin: 2 g/dL — ABNORMAL LOW (ref 3.5–5.0)
Alkaline Phosphatase: 64 U/L (ref 38–126)
Anion gap: 12 (ref 5–15)
BUN: 47 mg/dL — ABNORMAL HIGH (ref 8–23)
CO2: 24 mmol/L (ref 22–32)
Calcium: 6.7 mg/dL — ABNORMAL LOW (ref 8.9–10.3)
Chloride: 105 mmol/L (ref 98–111)
Creatinine, Ser: 3.29 mg/dL — ABNORMAL HIGH (ref 0.44–1.00)
GFR, Estimated: 13 mL/min — ABNORMAL LOW (ref >60)
Glucose, Bld: 90 mg/dL (ref 70–99)
Potassium: 3.3 mmol/L — ABNORMAL LOW (ref 3.5–5.1)
Sodium: 141 mmol/L (ref 135–145)
Total Bilirubin: 1.1 mg/dL (ref 0.3–1.2)
Total Protein: 5.1 g/dL — ABNORMAL LOW (ref 6.5–8.1)

## 2020-07-29 LAB — BPAM RBC
Blood Product Expiration Date: 202204212359
Blood Product Expiration Date: 202204212359
Blood Product Expiration Date: 202204262359
ISSUE DATE / TIME: 202203251211
ISSUE DATE / TIME: 202203251536
Unit Type and Rh: 1700
Unit Type and Rh: 5100
Unit Type and Rh: 5100

## 2020-07-29 LAB — GLUCOSE, CAPILLARY
Glucose-Capillary: 75 mg/dL (ref 70–99)
Glucose-Capillary: 70 mg/dL (ref 70–99)

## 2020-07-29 MED ORDER — POTASSIUM CHLORIDE 10 MEQ/100ML IV SOLN
10.0000 meq | INTRAVENOUS | Status: AC
  Administered 2020-07-29 (×2): 10 meq via INTRAVENOUS
  Filled 2020-07-29 (×2): qty 100

## 2020-07-29 MED ORDER — KCL IN DEXTROSE-NACL 20-5-0.9 MEQ/L-%-% IV SOLN: INTRAVENOUS | Status: DC

## 2020-07-29 NOTE — Progress Notes (Signed)
Palliative:     [name] is resting quietly in bed with PT and nursing staff at bedside.  PT is able to assist [name] to sit at the bedside, but she is unable to hold herself upright.  She does not make or keep eye contact, and does not respond in any meaningful way.  It seems that she is unable to manage her oral secretions and speech therapy has been consulted.  There is no family at bedside at this time.  I return later in the day to find [name] son, [name], at bedside.  He tells me that his mother has mostly been resting, not really interacting with him.  He states that his sister told him that their mother "might have" had a stroke.  I share that [name] did indeed have a stroke.  We talked about worry over her ability to be awake and alert enough to take in food.  We talked about carotid studies and speech therapy evaluation pending.  We talked about her A. fib, chronic anticoagulation use, low hemoglobin, chest wall bruising, medication management.  Morris will frequently return to his own issues with low blood.  I asked Morris to update his sister [name], and let her know that I will reach out to her tomorrow.  Conference with attending, bedside nursing staff, transition of care team, speech therapy related to patient condition, needs, goals of care, disposition.  Plan: At this point continue to treat the treatable but no CPR or intubation.  Speech therapy consult completed, n.p.o. at this time.  Awaiting echocardiogram.  Anticipate home with CAP aid services already in place and the benefits of "treat the treatable" hospice care. Prognosis: If [name] is not able to take in adequate food and drink, weeks would be expected as family declines PEG tube feeding.     45 minutes  Lillia Carmel, NP Palliative medicine team Team phone [phone] Greater than 50% of this time was spent counseling and coordinating care related to morbid assessment and plan.

## 2020-07-29 NOTE — Plan of Care (Signed)
  Problem: Acute Rehab PT Goals(only PT should resolve) Goal: Pt Will Go Supine/Side To Sit 07/29/2020 1008 by Ocie Bob, PT Outcome: Progressing 07/29/2020 1007 by Ocie Bob, PT Outcome: Progressing Flowsheets (Taken 07/29/2020 1007) Pt will go Supine/Side to Sit:  with moderate assist  with maximum assist Goal: Patient Will Transfer Sit To/From Stand 07/29/2020 1008 by Ocie Bob, PT Outcome: Progressing 07/29/2020 1007 by Ocie Bob, PT Outcome: Progressing Flowsheets (Taken 07/29/2020 1007) Patient will transfer sit to/from stand:  with moderate assist  with maximum assist Goal: Pt Will Transfer Bed To Chair/Chair To Bed 07/29/2020 1008 by Ocie Bob, PT Outcome: Progressing 07/29/2020 1007 by Ocie Bob, PT Outcome: Progressing Flowsheets (Taken 07/29/2020 1007) Pt will Transfer Bed to Chair/Chair to Bed: with max assist   Problem: Acute Rehab PT Goals(only PT should resolve) Goal: Pt will Roll Supine to Side Outcome: Progressing Flowsheets (Taken 07/29/2020 1008) Pt will Roll Supine to Side: with mod assist Goal: Patient Will Perform Sitting Balance Outcome: Progressing Flowsheets (Taken 07/29/2020 1008) Patient will perform sitting balance: with minimal assist  10:09 AM, 07/29/20 Ocie Bob, MPT Physical Therapist with [facility] 336 [phone] office [phone] mobile phone

## 2020-07-29 NOTE — Care Management Important Message (Signed)
Important Message  Patient Details  Name: [name] MRN: [id number] Date of Birth: 07-14-1928   Medicare Important Message Given:  Yes     Corey Harold 07/29/2020, 2:18 PM

## 2020-07-29 NOTE — Progress Notes (Signed)
PROGRESS NOTE  [name] ZOX:[id number] DOB: 1928/10/09 DOA: 07/11/2020 PCP: [name], NP  Brief History:  87 y.o.femalewith medical history ofdementia, CKD stage III, hypertension, hyperlipidemia, peripheral vascular disease, atrial fibrillation presenting with low hemoglobin.  At baseline, the patient is essentially bedbound/wheelchair bound. She requires significant assistance for activities of daily living, but she is able to feed herself. Interestingly, the patient son is the primary caregiver, but the patient's daughter provides much better history. Daughterstates that the patient has not been complaining of any chest pain, shortness breath, vomiting, diarrhea, dysuria. He has not noted any hematochezia, hematuria, hematemesis, epistaxis, vaginal bleeding. shestates that the patient had been recently started on an antibiotic for her leg wound. There is been no other new medications.  Daughter states that about a week ago, the patient went to see her PCP for routine visit. There were no able to obtain blood from venipuncture. As result, home health nurse was sent to the home for INR check and routine blood work. This was performed on 07/24/2020. Once the CBC resulted (5.0), EMS was activated by [facility].Family states that the patient has been eating fairly well at baseline. Denies any recent trauma to the patient. However, they suspected the patient has been trying to get out of bed and may have hit the side rails on her hospital bed.  Once the patient arrived to the floor, I was contacted by RN to see the patient secondary to a large hematoma noted on the patient's right thoracic cage area involving her right breast. Interestingly, the patient's son who is a primary caregiver was not aware of this hematoma. Also the patient was noted to have hypothermia with temperature of 93.2 F. Patient was given vitamin K to reverse warfarin 3/25.  Her Hgb remained stable  after vitamin K and PRBC transfusion.  On 3/27 evening, patient had altered mental status.  MRI brain on 07/28/20 showed evidence of cardioembolic stroke.  Her mental status continued to decline.  Palliative medicine was consulted.  Assessment/Plan: Acute blood loss anemia -Per ED provider, FOBT was negative -due to chest wall hematoma -2 units PRBC ordered -Given vitamin K -Holding Coumadin -Hgb largely stable since transfusion  Acute on chronic renal failure--CKD stage IIIb -Secondary to hemodynamic changes and volume depletion -Baseline hemoglobin 1.5-1.8 -Presented with hemoglobin 4.67 -Renal ultrasound--Nondiagnostic evaluation. Kidneys and bladder could not be identified, and evaluation is limited due to inability to position the patient. -no hydronephrosis on CT abd -patient is not a dialysis candidate  Coagulopathy/supratherapeutic INR -INR 9.5>>1.3 -vitamin K given -hold warfarin -started PPI -restart AC pending palliative discussions with family  Acute metabolic Encephalopathy -3/28 repeat UA/culture-->follow culture, UA 21-50WBC -MR brain-->acute confluent right parieto-occipital white matter infarct and multiple additional punctate acute infarcts in bilateral frontoparietal lobes and inferior cerebellum. Given the distribution, infarcts may be embolic -B12--being repleted -replete folate -TSH 1.831 -multifactorial including including ABLA and cardioembolic stroke -remains somnolent; does not speak or follow commands; awakens to voice  Acute Cardioembolic Stroke -neurology consult appreciated -carotid US--no hemodynamically significant stenosis -Echo -PT-->SNF -Speech eval-->NPO -restart AC pending palliative meeting with family  Metabolic acidosis -Started IV bicarbonate>>discontinue 3/28  Right ankle wound -see pictures -wound care consult appreciated  Leukemoid reaction -Check PCT--0.37 -3/25--UA and urine culture--unrevealing -Personally  reviewed chest x-ray--extremely rotated, questionRLL effusion/infiltrate -3/26 CT chest--8.3 x 10.2 x 9.2 cm right chest hematoma likely a intramuscular hematoma centered between the pectoralis major and minor musculature,  extending into the deep axilla; bipical scarring;  Mild centrilobular emphysema -initially started on empiric cefepime-->stopped 3/27 -overall improved--monitor off abx  Hypothermia -am cortisol 30.2 -blood cultures neg -TSH 1.831 -3/25 urine culture unrevealing -improved  Macrocytic anemia -Check B12--129-->started B12 -Check folic acid--6.2  Essential hypertension -continue IV lopressor -continue amlodipine  Dementia without behavioral disturbance -Continue Aricept  Hyperlipidemia -Continue statin  Chronic atrial fibrillation -Holding Coumadin secondary to coagulopathy      Status is: Inpatient  Remains inpatient appropriate because:Altered mental status   Dispo: The patient is from: Home              Anticipated d/c is to: Home              Patient currently is not medically stable to d/c.   Difficult to place patient No        Family Communication: sister updated 3/29  Consultants:  Neurology; palliative  Code Status:  DNR  DVT Prophylaxis:  SCD, warfarin on hold   Procedures: As Listed in Progress Note Above  Antibiotics: cefepim 3/25>>3/27        Subjective: Patient awakens to voice but does not speak or follow commands.  No respiratory distress.  No vomiting. No diarrhea.  No uncontrolled pain  Objective: Vitals:   07/29/20 1214 07/29/20 1300 07/29/20 1400 07/29/20 1500  BP:  (!) 198/64 (!) 196/62 (!) 194/73  Pulse:  90 86 83  Resp:  18 16 13   Temp: (!) 97.5 F (36.4 C)     TempSrc: Axillary     SpO2:  96% 97% 97%  Weight:      Height:        Intake/Output Summary (Last 24 hours) at 07/29/2020 1556 Last data filed at 07/29/2020 0758 Gross per 24 hour  Intake 1194.44 ml  Output 700 ml  Net  494.44 ml   Weight change:  Exam:   General:  Pt is alert, follows commands appropriately, not in acute distress  HEENT: No icterus, No thrush, No neck mass, [place]/AT  Cardiovascular: RRR, S1/S2, no rubs, no gallops  Respiratory: bibasilar rales. No wheeze.  Poor inspiratory effort  Abdomen: Soft/+BS, non tender, non distended, no guarding  Extremities: 1+ RLE edema, +ecchymosis or right chest wall   Data Reviewed: I have personally reviewed following labs and imaging studies Basic Metabolic Panel: Recent Labs  Lab 08/11/2020 1024 07/26/20 0513 07/27/20 0753 07/28/20 0418 07/29/20 0627  NA 138 142 139 141 141  K 5.0 3.7 3.4* 3.5 3.3*  CL 114* 110 106 103 105  CO2 8* 20* 22 25 24   GLUCOSE 120* 176* 95 99 90  BUN 69* 66* 58* 53* 47*  CREATININE 4.67* 4.37* 4.05* 3.61* 3.29*  CALCIUM 8.0* 7.4* 6.8* 6.7* 6.7*   Liver Function Tests: Recent Labs  Lab 07/24/20 1125 Aug 01, 2020 1024 07/27/20 0753 07/28/20 0418 07/29/20 0627  AST 13* 21 27 25 27   ALT 7 9 17 16 16   ALKPHOS 52 59 56 59 64  BILITOT 0.3 0.4 0.6 0.7 1.1  PROT 5.1* 5.4* 5.0* 5.2* 5.1*  ALBUMIN 2.0* 2.2* 2.1* 2.1* 2.0*   No results for input(s): LIPASE, AMYLASE in the last 168 hours. No results for input(s): AMMONIA in the last 168 hours. Coagulation Profile: Recent Labs  Lab 07/24/20 1140 08/14/2020 1024 07/26/20 0513  INR 8.0* 9.5* 1.3*   CBC: Recent Labs  Lab 07/24/20 1125 08/21/2020 1024 07/26/20 0513 07/27/20 0753 07/28/20 0418 07/29/20 0627  WBC 8.6 13.5* 14.1* 11.7* 11.4* 11.1*  NEUTROABS 6.9 11.0*  --   --   --   --   HGB 5.0* 4.0* 7.6* 7.4* 7.2* 7.7*  HCT 15.8* 14.1* 22.6* 21.8* 22.0* 24.2*  MCV 99.4 111.9* 97.0 97.3 98.7 100.0  PLT 289 334 251 214 196 174   Cardiac Enzymes: No results for input(s): CKTOTAL, CKMB, CKMBINDEX, TROPONINI in the last 168 hours. BNP: Invalid input(s): POCBNP CBG: Recent Labs  Lab 07/23/2020 0945 07/28/20 1935 07/29/20 1217  GLUCAP 106* 92 75    HbA1C: No results for input(s): HGBA1C in the last 72 hours. Urine analysis:    Component Value Date/Time   COLORURINE YELLOW 07/28/2020 1451   APPEARANCEUR HAZY (A) 07/28/2020 1451   LABSPEC 1.009 07/28/2020 1451   PHURINE 8.0 07/28/2020 1451   GLUCOSEU NEGATIVE 07/28/2020 1451   HGBUR MODERATE (A) 07/28/2020 1451   BILIRUBINUR NEGATIVE 07/28/2020 1451   BILIRUBINUR neg 04/13/2012 1612   KETONESUR NEGATIVE 07/28/2020 1451   PROTEINUR 100 (A) 07/28/2020 1451   UROBILINOGEN 0.2 10/04/2012 1514   NITRITE NEGATIVE 07/28/2020 1451   LEUKOCYTESUR TRACE (A) 07/28/2020 1451   Sepsis Labs: @LABRCNTIP (procalcitonin:4,lacticidven:4) ) Recent Results (from the past 240 hour(s))  Culture, Urine     Status: Abnormal   Collection Time: 07/29/2020 10:35 AM   Specimen: Urine, Clean Catch  Result Value Ref Range Status   Specimen Description   Final    URINE, CLEAN CATCH Performed at [facility], [address], [place], [place]    Special Requests   Final    NONE Performed at [facility], [address], [place], [place]    Culture MULTIPLE SPECIES PRESENT, SUGGEST RECOLLECTION (A)  Final   Report Status 07/28/2020 FINAL  Final  MRSA PCR Screening     Status: None   Collection Time: 07/05/2020  3:37 PM   Specimen: Nasal Mucosa; Nasopharyngeal  Result Value Ref Range Status   MRSA by PCR NEGATIVE NEGATIVE Final    Comment:        The GeneXpert MRSA Assay (FDA approved for NASAL specimens only), is one component of a comprehensive MRSA colonization surveillance program. It is not intended to diagnose MRSA infection nor to guide or monitor treatment for MRSA infections. Performed at [facility], [address] [place], [place]   Culture, blood (Routine X 2) w Reflex to ID Panel     Status: None (Preliminary result)   Collection Time: 07/13/2020  8:11 PM   Specimen: BLOOD LEFT HAND  Result Value Ref Range Status   Specimen Description BLOOD LEFT HAND   Final   Special Requests   Final    BOTTLES DRAWN AEROBIC ONLY Blood Culture adequate volume   Culture   Final    NO GROWTH 4 DAYS Performed at [facility], [address], [place], [place]    Report Status PENDING  Incomplete  Culture, blood (Routine X 2) w Reflex to ID Panel     Status: None (Preliminary result)   Collection Time: 07/21/2020  8:21 PM   Specimen: BLOOD LEFT ARM  Result Value Ref Range Status   Specimen Description BLOOD LEFT ARM  Final   Special Requests   Final    BOTTLES DRAWN AEROBIC ONLY Blood Culture adequate volume   Culture   Final    NO GROWTH 4 DAYS Performed at [facility], [address] [place], [place]    Report Status PENDING  Incomplete     Scheduled Meds: . amLODipine  10 mg Oral Daily  . Chlorhexidine Gluconate Cloth  6 each Topical Daily  . cyanocobalamin  1,000 mcg Subcutaneous Daily  . donepezil  10 mg Oral Daily  . metoprolol tartrate  2.5 mg Intravenous Q6H  . pantoprazole (PROTONIX) IV  40 mg Intravenous Q24H  . pravastatin  10 mg Oral Daily   Continuous Infusions: . sodium chloride    . sodium chloride    . sodium chloride 75 mL/hr at 07/29/20 1497    Procedures/Studies: CT ABDOMEN PELVIS WO CONTRAST  Result Date: 07/26/2020 CLINICAL DATA:  Anemia, supratherapeutic INR, concern for retroperitoneal hematoma EXAM: CT CHEST, ABDOMEN AND PELVIS WITHOUT CONTRAST TECHNIQUE: Multidetector CT imaging of the chest, abdomen and pelvis was performed following the standard protocol without IV contrast. COMPARISON:  CT chest dated 07/15/2018 FINDINGS: CT CHEST FINDINGS Cardiovascular: Cardiomegaly with left atrial enlargement. No pericardial effusion. No evidence of thoracic aortic aneurysm. Atherosclerotic calcifications of the aortic arch. Coronary atherosclerosis of the LAD and right coronary artery. Mediastinum/Nodes: No suspicious mediastinal lymphadenopathy. Visualized thyroid is unremarkable. Lungs/Pleura: Biapical  pleural-parenchymal scarring. Trace chronic right pleural fluid with scarring/volume loss in the right lower lobe, improved. Mild centrilobular emphysematous changes in the upper lobes. Mild subpleural reticulation in the lungs bilaterally. Evaluation lung parenchyma is constrained by respiratory motion. Within that constraint, there are no suspicious pulmonary nodules. No pneumothorax. Musculoskeletal: 8.3 x 10.2 x 9.2 cm right chest hematoma (series 3/image 8), likely a intramuscular hematoma centered between the pectoralis major and minor musculature, extending into the deep axilla. Surrounding fluid/chest wall edema extending inferiorly along the dependent right anterior abdominal wall (series 3/image 44). Mild degenerative changes of the visualized thoracolumbar spine. Moderate superior endplate compression fracture deformity at T12, chronic. No retropulsion. CT ABDOMEN PELVIS FINDINGS Hepatobiliary: Unenhanced liver is unremarkable. Gallbladder is grossly unremarkable. No intrahepatic or extrahepatic ductal dilatation. Pancreas: Grossly unremarkable. Spleen: Within normal limits. Adrenals/Urinary Tract: Adrenal glands are within normal limits. Kidneys are within normal limits. No renal calculi or hydronephrosis. Bladder is within normal limits. Stomach/Bowel: Stomach is within normal limits. No evidence of bowel obstruction. Appendix is not discretely visualized. Sigmoid diverticulosis, without evidence of diverticulitis. Vascular/Lymphatic: No evidence of abdominal aortic aneurysm. Atherosclerotic calcifications of the abdominal aorta and branch vessels. No suspicious abdominopelvic lymphadenopathy. Reproductive: Suspected prior hysterectomy. No adnexal masses. Other: No abdominopelvic ascites. No free air. Musculoskeletal: Degenerative changes of the lumbar spine. IMPRESSION: 10.2 cm intramuscular hematoma in the right chest wall, as described above, likely secondary to the patient's supratherapeutic INR.  Additional chronic ancillary findings, as described above. Aortic Atherosclerosis (ICD10-I70.0) and Emphysema (ICD10-J43.9). Electronically Signed   By: Charline Bills M.D.   On: 07/26/2020 06:11   CT Head Wo Contrast  Result Date: 2020/08/03 CLINICAL DATA:  Altered mental status EXAM: CT HEAD WITHOUT CONTRAST TECHNIQUE: Contiguous axial images were obtained from the base of the skull through the vertex without intravenous contrast. COMPARISON:  None. FINDINGS: Brain: No evidence of acute infarction, hemorrhage, hydrocephalus, extra-axial collection or mass lesion/mass effect. Advanced low-density changes within the periventricular and subcortical white matter compatible with chronic microvascular ischemic change. Moderate diffuse cerebral volume loss. Vascular: Atherosclerotic calcifications involving the large vessels of the skull base. No unexpected hyperdense vessel. Skull: Normal. Negative for fracture or focal lesion. Sinuses/Orbits: Complete opacification of the bilateral mastoid air cells. Right maxillary mucous retention cyst. Other: None. IMPRESSION: 1. No acute intracranial findings. 2. Advanced chronic microvascular ischemic change and cerebral volume loss. 3. Complete opacification of the bilateral mastoid air cells.  Electronically Signed   By: Duanne Guess D.O.   On: 07/14/2020 12:40   CT CHEST WO CONTRAST  Result Date: 07/26/2020 CLINICAL DATA:  Anemia, supratherapeutic INR, concern for retroperitoneal hematoma EXAM: CT CHEST, ABDOMEN AND PELVIS WITHOUT CONTRAST TECHNIQUE: Multidetector CT imaging of the chest, abdomen and pelvis was performed following the standard protocol without IV contrast. COMPARISON:  CT chest dated 07/15/2018 FINDINGS: CT CHEST FINDINGS Cardiovascular: Cardiomegaly with left atrial enlargement. No pericardial effusion. No evidence of thoracic aortic aneurysm. Atherosclerotic calcifications of the aortic arch. Coronary atherosclerosis of the LAD and right  coronary artery. Mediastinum/Nodes: No suspicious mediastinal lymphadenopathy. Visualized thyroid is unremarkable. Lungs/Pleura: Biapical pleural-parenchymal scarring. Trace chronic right pleural fluid with scarring/volume loss in the right lower lobe, improved. Mild centrilobular emphysematous changes in the upper lobes. Mild subpleural reticulation in the lungs bilaterally. Evaluation lung parenchyma is constrained by respiratory motion. Within that constraint, there are no suspicious pulmonary nodules. No pneumothorax. Musculoskeletal: 8.3 x 10.2 x 9.2 cm right chest hematoma (series 3/image 8), likely a intramuscular hematoma centered between the pectoralis major and minor musculature, extending into the deep axilla. Surrounding fluid/chest wall edema extending inferiorly along the dependent right anterior abdominal wall (series 3/image 44). Mild degenerative changes of the visualized thoracolumbar spine. Moderate superior endplate compression fracture deformity at T12, chronic. No retropulsion. CT ABDOMEN PELVIS FINDINGS Hepatobiliary: Unenhanced liver is unremarkable. Gallbladder is grossly unremarkable. No intrahepatic or extrahepatic ductal dilatation. Pancreas: Grossly unremarkable. Spleen: Within normal limits. Adrenals/Urinary Tract: Adrenal glands are within normal limits. Kidneys are within normal limits. No renal calculi or hydronephrosis. Bladder is within normal limits. Stomach/Bowel: Stomach is within normal limits. No evidence of bowel obstruction. Appendix is not discretely visualized. Sigmoid diverticulosis, without evidence of diverticulitis. Vascular/Lymphatic: No evidence of abdominal aortic aneurysm. Atherosclerotic calcifications of the abdominal aorta and branch vessels. No suspicious abdominopelvic lymphadenopathy. Reproductive: Suspected prior hysterectomy. No adnexal masses. Other: No abdominopelvic ascites. No free air. Musculoskeletal: Degenerative changes of the lumbar spine.  IMPRESSION: 10.2 cm intramuscular hematoma in the right chest wall, as described above, likely secondary to the patient's supratherapeutic INR. Additional chronic ancillary findings, as described above. Aortic Atherosclerosis (ICD10-I70.0) and Emphysema (ICD10-J43.9). Electronically Signed   By: Charline Bills M.D.   On: 07/26/2020 06:11   MR ANGIO HEAD WO CONTRAST  Result Date: 07/28/2020 CLINICAL DATA:  Neuro deficit, acute stroke suspected. Facial droop and altered mental status. EXAM: MRI HEAD WITHOUT CONTRAST MRA HEAD WITHOUT CONTRAST TECHNIQUE: Multiplanar, multiecho pulse sequences of the brain and surrounding structures were obtained without intravenous contrast. Angiographic images of the head were obtained using MRA technique without contrast. COMPARISON:  CT head July 25, 2020. FINDINGS: MRI HEAD FINDINGS Motion limited study. Brain: Confluent restricted diffusion in the right parieto-occipital region, which appears to predominantly involve white matter. Multiple bilateral punctate acute infarcts involving bilateral frontal and parietal lobes (both cortex and white matter). Multiple punctate infarcts involving the inferior cerebellum bilaterally (series 5, image 5 and series 7, image 6 and 8). No substantial mass effect. No evidence of acute hemorrhage. Advanced scattered T2/FLAIR hyperintensity within the white matter and pons, most likely related to chronic microvascular ischemic disease. No evidence of mass lesion. No midline shift. Generalized cerebral volume loss with ex vacuo ventricular dilation. No hydrocephalus. Vascular: Major arterial flow voids are maintained at the skull base. Skull and upper cervical spine: Normal marrow signal. Sinuses/Orbits: Inferior right maxillary sinus mucosal cyst. Otherwise, sinuses are largely clear. Unremarkable orbits. Other: Bilateral mastoid effusions. MRA HEAD  FINDINGS Anterior circulation: Patent bilateral ICAs. Patent MCAs and ACAs with limited  evaluation for stenosis or aneurysm due to motion Posterior circulation: Intradural vertebral arteries are partially imaged. The right vertebral artery appears to largely terminate as PICA. The visualized intradural left vertebral artery is patent. The basilar artery and proximal bilateral posterior cerebral arteries are patent. Suspected moderate stenosis of the left P2 PCA IMPRESSION: MRI head: 1. Motion limited study with evidence of acute confluent right parieto-occipital white matter infarct and multiple additional punctate acute infarcts in bilateral frontoparietal lobes and inferior cerebellum. Given the distribution, infarcts may be embolic and/or watershed in etiology. No substantial mass effect. 2. Advanced chronic microvascular ischemic disease. 3. Bilateral mastoid effusions. MRA: 1. Motion limited evaluation without evidence of emergent large vessel occlusion. 2. Possible moderate stenosis of the left P2 PCA. Otherwise, limited evaluation for stenosis (particularly in the anterior circulation) given motion. Follow-up CTA could provide more sensitive evaluation if clinically indicated. These results will be called to the ordering clinician or representative by the Radiologist Assistant, and communication documented in the PACS or [employer]. Electronically Signed   By: Feliberto Harts MD   On: 07/28/2020 13:46   MR BRAIN WO CONTRAST  Result Date: 07/28/2020 CLINICAL DATA:  Neuro deficit, acute stroke suspected. Facial droop and altered mental status. EXAM: MRI HEAD WITHOUT CONTRAST MRA HEAD WITHOUT CONTRAST TECHNIQUE: Multiplanar, multiecho pulse sequences of the brain and surrounding structures were obtained without intravenous contrast. Angiographic images of the head were obtained using MRA technique without contrast. COMPARISON:  CT head 2020/08/02. FINDINGS: MRI HEAD FINDINGS Motion limited study. Brain: Confluent restricted diffusion in the right parieto-occipital region, which  appears to predominantly involve white matter. Multiple bilateral punctate acute infarcts involving bilateral frontal and parietal lobes (both cortex and white matter). Multiple punctate infarcts involving the inferior cerebellum bilaterally (series 5, image 5 and series 7, image 6 and 8). No substantial mass effect. No evidence of acute hemorrhage. Advanced scattered T2/FLAIR hyperintensity within the white matter and pons, most likely related to chronic microvascular ischemic disease. No evidence of mass lesion. No midline shift. Generalized cerebral volume loss with ex vacuo ventricular dilation. No hydrocephalus. Vascular: Major arterial flow voids are maintained at the skull base. Skull and upper cervical spine: Normal marrow signal. Sinuses/Orbits: Inferior right maxillary sinus mucosal cyst. Otherwise, sinuses are largely clear. Unremarkable orbits. Other: Bilateral mastoid effusions. MRA HEAD FINDINGS Anterior circulation: Patent bilateral ICAs. Patent MCAs and ACAs with limited evaluation for stenosis or aneurysm due to motion Posterior circulation: Intradural vertebral arteries are partially imaged. The right vertebral artery appears to largely terminate as PICA. The visualized intradural left vertebral artery is patent. The basilar artery and proximal bilateral posterior cerebral arteries are patent. Suspected moderate stenosis of the left P2 PCA IMPRESSION: MRI head: 1. Motion limited study with evidence of acute confluent right parieto-occipital white matter infarct and multiple additional punctate acute infarcts in bilateral frontoparietal lobes and inferior cerebellum. Given the distribution, infarcts may be embolic and/or watershed in etiology. No substantial mass effect. 2. Advanced chronic microvascular ischemic disease. 3. Bilateral mastoid effusions. MRA: 1. Motion limited evaluation without evidence of emergent large vessel occlusion. 2. Possible moderate stenosis of the left P2 PCA. Otherwise,  limited evaluation for stenosis (particularly in the anterior circulation) given motion. Follow-up CTA could provide more sensitive evaluation if clinically indicated. These results will be called to the ordering clinician or representative by the Radiologist Assistant, and communication documented in the PACS or  [employer] [facility]. Electronically Signed   By: Feliberto HartsFrederick S Jones MD   On: 07/28/2020 13:46   US RENAL  Result Date: 2020/07/21 CLINICAL DATA:  Acute on chronic renal failure, tobacco abuse, chronic renal insufficiency EXAM: RENAL / URINARY TRACT ULTRASOUND COMPLETE COMPARISON:  None. FINDINGS: Right Kidney: Not visualized. Limited by patient cooperation and inability to position the patient. Left Kidney: Not visualized. Limited by patient cooperation and inability to position the patient. Bladder: Not visualized. Limited by patient cooperation and inability to position patient. Other: None. IMPRESSION: 1. Nondiagnostic evaluation. Kidneys and bladder could not be identified, and evaluation is limited due to inability to position the patient. Electronically Signed   By: Sharlet SalinaMichael  Brown M.D.   On: 02022/03/21 15:15   US Carotid Bilateral  Result Date: 07/29/2020 CLINICAL DATA:  Hypertension Hyperlipidemia Tobacco use Dementia Peripheral vascular disease Neurologic deficit Acute stroke suspected Facial droop Altered mental status EXAM: BILATERAL CAROTID DUPLEX ULTRASOUND TECHNIQUE: Wallace CullensGray scale imaging, color Doppler and duplex ultrasound were performed of bilateral carotid and vertebral arteries in the neck. COMPARISON:  None. FINDINGS: Criteria: Quantification of carotid stenosis is based on velocity parameters that correlate the residual internal carotid diameter with NASCET-based stenosis levels, using the diameter of the distal internal carotid lumen as the denominator for stenosis measurement. The following velocity measurements were obtained: RIGHT ICA: 70/8 cm/sec CCA: 86/5 cm/sec SYSTOLIC ICA/CCA  RATIO:  0.8 ECA: 52 cm/sec LEFT ICA: 90/13 cm/sec CCA: 76/7 cm/sec SYSTOLIC ICA/CCA RATIO:  1.2 ECA: 40 cm/sec RIGHT CAROTID ARTERY: Mild atherosclerotic plaque of the right carotid bulb without flow-limiting stenosis. RIGHT VERTEBRAL ARTERY:  Antegrade flow. LEFT CAROTID ARTERY: Mild atheromatous plaque of the distal common and proximal internal carotid arteries without high-grade stenosis. LEFT VERTEBRAL ARTERY:  Antegrade flow. IMPRESSION: Less than 50% stenosis of the internal carotid arteries. Electronically Signed   By: Acquanetta BellingFarhaan  Mir M.D.   On: 07/29/2020 10:45   DG Chest Portable 1 View  Result Date: 2020/07/21 CLINICAL DATA:  Altered mental status EXAM: PORTABLE CHEST 1 VIEW COMPARISON:  Chest CT from July 15, 2018 FINDINGS: Limited rotated chest. Asymmetric density at the right base with costophrenic sulcus blunting. No edema or pneumothorax. Large volume lungs in general. IMPRESSION: 1. Limited rotated chest. 2. Small right pleural effusion which could be chronic or related to scarring based on a 2020 chest CT. Electronically Signed   By: Marnee SpringJonathon  Watts M.D.   On: 02022/03/21 10:48    Catarina Hartshornavid Zyan Mirkin, DO  [facility]  If 7PM-7AM, please contact night-coverage [email or web address] Password [email or web address] 07/29/2020, 3:56 PM   LOS: 4 days

## 2020-07-29 NOTE — Evaluation (Signed)
Physical Therapy Evaluation Patient Details Name: [name] MRN: [id number] DOB: 10-Aug-1928 Today's Date: 07/29/2020   History of Present Illness  [name] is a 87 y.o. female with medical history of dementia, CKD stage III, hypertension, hyperlipidemia, peripheral vascular disease, atrial fibrillation presenting with low hemoglobin.  Apparently, the patient had a routine blood work done on 07/24/2020 by home health.  She was noted to have a hemoglobin of 5.0.  As result, home health RN activated EMS.  Unfortunately, no family is available to provide any additional history.  Multiple attempts were made to contact the patient's family without success.  History is obtained from review of the medical record and speaking with the ED staff.  Apparently, the patient lives with her son who is the primary caretaker.  There was no given history of any distress, shortness of breath, pain, or active bleeding.  EMS reported that the patient has underlying dementia and "sleeps heavily".  The patient herself is awake and alert and occasionally answers simple questions.  She denies any chest pain, shortness of breath, abdominal pain.  Remainder the review of systems is unobtainable secondary to patient's advanced dementia.    Clinical Impression  Patient demonstrates slow labored movement for sitting up at bedside with limited use of BUE due to weakness, RUE nonfunctional and unable to move fingers, some grip strength noted in left hand, unable to keep BLE extended when stretching and tends to keep body in fetal position when attempting to put patient supine in bed.  Patient unable to attempt sit to stands or transfers due to weakness.  Patient will benefit from continued physical therapy in hospital and recommended venue below to increase strength, balance, endurance for safe ADLs and gait.     Follow Up Recommendations SNF;Supervision for mobility/OOB;Supervision - Intermittent    Equipment  Recommendations  None recommended by PT    Recommendations for Other Services       Precautions / Restrictions Precautions Precautions: Fall Restrictions Weight Bearing Restrictions: No      Mobility  Bed Mobility Overal bed mobility: Needs Assistance Bed Mobility: Supine to Sit;Sit to Supine     Supine to sit: Max assist;Total assist Sit to supine: Max assist;Total assist   General bed mobility comments: slow labored movement, unable to move extremities due to weakness    Transfers                    Ambulation/Gait                Stairs            Wheelchair Mobility    Modified Rankin (Stroke Patients Only)       Balance Overall balance assessment: Needs assistance Sitting-balance support: Feet unsupported;Single extremity supported Sitting balance-Leahy Scale: Fair Sitting balance - Comments: frequent falling over to the right Postural control: Right lateral lean                                   Pertinent Vitals/Pain Pain Assessment: Faces Faces Pain Scale: Hurts little more Pain Location: movement RUE, end range movement in legs Pain Descriptors / Indicators: Grimacing;Guarding Pain Intervention(s): Limited activity within patient's tolerance;Monitored during session;Repositioned    Home Living Family/patient expects to be discharged to:: Private residence Living Arrangements: Children Available Help at Discharge: Family;Personal care attendant;Available 24 hours/day  Additional Comments: Patient is poor historian    Prior Function Level of Independence: Needs assistance   Gait / Transfers Assistance Needed: assisted transfers by home aides/family  ADL's / Homemaking Assistance Needed: assisted by home aides/family        Hand Dominance        Extremity/Trunk Assessment   Upper Extremity Assessment Upper Extremity Assessment: Generalized weakness;RUE deficits/detail;LUE  deficits/detail RUE Deficits / Details: grossly 0-1/5, unable to movement fingers RUE: Unable to fully assess due to pain LUE Deficits / Details: grossly 2/5    Lower Extremity Assessment Lower Extremity Assessment: Generalized weakness    Cervical / Trunk Assessment Cervical / Trunk Assessment: Kyphotic  Communication   Communication: Expressive difficulties  Cognition Arousal/Alertness: Awake/alert Behavior During Therapy: Flat affect Overall Cognitive Status: History of cognitive impairments - at baseline                                        General Comments      Exercises     Assessment/Plan    PT Assessment Patient needs continued PT services  PT Problem List Decreased strength;Decreased activity tolerance;Decreased balance;Decreased mobility       PT Treatment Interventions DME instruction;Functional mobility training;Therapeutic activities;Therapeutic exercise;Patient/family education;Neuromuscular re-education;Balance training    PT Goals (Current goals can be found in the Care Plan section)  Acute Rehab PT Goals Patient Stated Goal: not stated PT Goal Formulation: With patient Time For Goal Achievement: 08/12/20 Potential to Achieve Goals: Fair    Frequency Min 3X/week   Barriers to discharge        Co-evaluation               AM-PAC PT "6 Clicks" Mobility  Outcome Measure Help needed turning from your back to your side while in a flat bed without using bedrails?: A Lot Help needed moving from lying on your back to sitting on the side of a flat bed without using bedrails?: A Lot Help needed moving to and from a bed to a chair (including a wheelchair)?: Total Help needed standing up from a chair using your arms (e.g., wheelchair or bedside chair)?: Total Help needed to walk in hospital room?: Total Help needed climbing 3-5 steps with a railing? : Total 6 Click Score: 8    End of Session   Activity Tolerance: Patient limited  by fatigue;Other (comment) (Patient limited due to generalized weakness) Patient left: in bed;with call bell/phone within reach Nurse Communication: Mobility status PT Visit Diagnosis: Unsteadiness on feet (R26.81);Other abnormalities of gait and mobility (R26.89);Muscle weakness (generalized) (M62.81)    Time: 3818-2993 PT Time Calculation (min) (ACUTE ONLY): 20 min   Charges:   PT Evaluation $PT Eval Moderate Complexity: 1 Mod PT Treatments $Therapeutic Activity: 8-22 mins        10:06 AM, 07/29/20 Ocie Bob, MPT Physical Therapist with [facility] [phone] office [phone] mobile phone

## 2020-07-29 NOTE — Evaluation (Signed)
Clinical/Bedside Swallow Evaluation Patient Details  Name: MODEST DRAEGER MRN: [id number] Date of Birth: 01-Dec-1928  Today's Date: 07/29/2020 Time: SLP Start Time (ACUTE ONLY): 1300 SLP Stop Time (ACUTE ONLY): 1324 SLP Time Calculation (min) (ACUTE ONLY): 24 min  Past Medical History:  Past Medical History:  Diagnosis Date  . Anemia    Hemoglobin of 11.6 in 01/2010; 12.1 in 08/2010  . Atrial fibrillation (HCC)    chronic requiring anticoagulation  . Chronic anticoagulation   . Chronic kidney disease    Stage III; creatinine of 1.7 in 04/2007 and 1.5 2009; 1.7 in 04/2010  . Hyperlipidemia    Total cholesterol of 226, triglycerides of 90, HDL of 49 and LDL of 159 in 01/2010  . Hypertension    Past Surgical History:  Past Surgical History:  Procedure Laterality Date  . APPENDECTOMY    . BUNIONECTOMY  1995  . PARTIAL HYSTERECTOMY     HPI:  This 87 year old black female who was found to have severe anemia on routine tested by his primary care provider with a hemoglobin of  5. This prompted urgent admission.  The patient is on Coumadin with supratherapeutic INR 9 which is thought to be the source of his anemia particularly also with chest wall hematoma. The warfarin has been discontinued and reversed. The patient also noted to have hypersomnia although he has baseline advanced dementia. Imaging shows evidence of stroke and hence a neurological consultation. At baseline the patient does have cognitive impairment and requires assistance with daily activities and essentially is wheelchair bound/ bed-bound.  The nurse reports that the patient was more responsive during her initial admission.   Assessment / Plan / Recommendation Clinical Impression  Clinical swallow evaluation completed, however somewhat limited due to Pt lethargy. Pt lifted head and opened eyes very briefly, but remained lethargic for the rest of the evaluation. Pt is edentulous. She initially opened her mouth/lips in response to  ice chip to lips and accepted the bolus. She required multimodality cues, primarily tactile, to move the ice chip and eventually swallow. Pt accepted three ice chips over the course of the evaluation with subsequent delayed swallow trigger (after 30+ seconds) and some right labial spillage of secretions. Pt with minimal lingual movement and mostly in lingual thrust pattern. Pt is not alert enough for safe po intake at this time. Recommend NPO with oral care and occasional ice chips for comfort if/when Pt is alert and upright. SLP will check back tomorrow for po appropriateness. SLP Visit Diagnosis: Dysphagia, unspecified (R13.10)    Aspiration Risk  Risk for inadequate nutrition/hydration;Moderate aspiration risk    Diet Recommendation Ice chips PRN after oral care;NPO;Alternative means - temporary   Medication Administration: Via alternative means    Other  Recommendations Oral Care Recommendations: Oral care prior to ice chip/H20;Staff/trained caregiver to provide oral care;Oral care QID   Follow up Recommendations 24 hour supervision/assistance (pending clinical course)      Frequency and Duration min 2x/week  1 week       Prognosis Prognosis for Safe Diet Advancement: Guarded Barriers to Reach Goals: Cognitive deficits (lethargy)      Swallow Study   General Date of Onset: 2020-08-03 HPI: This 87 year old black female who was found to have severe anemia on routine tested by his primary care provider with a hemoglobin of  5. This prompted urgent admission.  The patient is on Coumadin with supratherapeutic INR 9 which is thought to be the source of his anemia particularly also with chest  wall hematoma. The warfarin has been discontinued and reversed. The patient also noted to have hypersomnia although he has baseline advanced dementia. Imaging shows evidence of stroke and hence a neurological consultation. At baseline the patient does have cognitive impairment and requires assistance with  daily activities and essentially is wheelchair bound/ bed-bound.  The nurse reports that the patient was more responsive during her initial admission. Type of Study: Bedside Swallow Evaluation Previous Swallow Assessment: N/A Diet Prior to this Study: NPO Temperature Spikes Noted: No Respiratory Status: Room air History of Recent Intubation: No Behavior/Cognition: Lethargic/Drowsy Oral Cavity Assessment: Within Functional Limits Oral Care Completed by SLP: Yes Oral Cavity - Dentition: Edentulous Vision:  (unable to assess this date due to lethargy) Self-Feeding Abilities: Total assist Patient Positioning: Upright in bed Baseline Vocal Quality: Not observed Volitional Cough: Cognitively unable to elicit Volitional Swallow: Unable to elicit    Oral/Motor/Sensory Function Overall Oral Motor/Sensory Function: Generalized oral weakness (Pt unable to follow commands due to lethargy) Facial Symmetry: Within Functional Limits Lingual Strength: Reduced;Suspected CN XII (hypoglossal) dysfunction Velum:  (could not visualize)   Ice Chips Ice chips: Impaired Presentation: Spoon Oral Phase Impairments: Reduced labial seal;Reduced lingual movement/coordination;Poor awareness of bolus Oral Phase Functional Implications: Right anterior spillage;Prolonged oral transit Pharyngeal Phase Impairments: Decreased hyoid-laryngeal movement;Suspected delayed Swallow   Thin Liquid Thin Liquid: Not tested    Nectar Thick Nectar Thick Liquid: Not tested   Honey Thick Honey Thick Liquid: Not tested   Puree Puree: Not tested   Solid     Solid: Not tested     Thank you,  Havery Moros, CCC-SLP [phone]  Jimie Kuwahara 07/29/2020,1:34 PM

## 2020-07-30 ENCOUNTER — Inpatient Hospital Stay (HOSPITAL_COMMUNITY): Payer: Medicare Other

## 2020-07-30 DIAGNOSIS — I4891 Unspecified atrial fibrillation: Secondary | ICD-10-CM | POA: Diagnosis not present

## 2020-07-30 DIAGNOSIS — Z7189 Other specified counseling: Secondary | ICD-10-CM | POA: Diagnosis not present

## 2020-07-30 DIAGNOSIS — I6389 Other cerebral infarction: Secondary | ICD-10-CM

## 2020-07-30 DIAGNOSIS — I639 Cerebral infarction, unspecified: Secondary | ICD-10-CM | POA: Diagnosis not present

## 2020-07-30 DIAGNOSIS — D689 Coagulation defect, unspecified: Secondary | ICD-10-CM | POA: Diagnosis not present

## 2020-07-30 LAB — BASIC METABOLIC PANEL
Anion gap: 13 (ref 5–15)
BUN: 44 mg/dL — ABNORMAL HIGH (ref 8–23)
CO2: 22 mmol/L (ref 22–32)
Calcium: 7.3 mg/dL — ABNORMAL LOW (ref 8.9–10.3)
Chloride: 108 mmol/L (ref 98–111)
Creatinine, Ser: 3.09 mg/dL — ABNORMAL HIGH (ref 0.44–1.00)
GFR, Estimated: 14 mL/min — ABNORMAL LOW (ref >60)
Glucose, Bld: 98 mg/dL (ref 70–99)
Potassium: 3.7 mmol/L (ref 3.5–5.1)
Sodium: 143 mmol/L (ref 135–145)

## 2020-07-30 LAB — URINE CULTURE: Culture: 60000 — AB

## 2020-07-30 LAB — CBC
HCT: 24 % — ABNORMAL LOW (ref 36.0–46.0)
Hemoglobin: 7.6 g/dL — ABNORMAL LOW (ref 12.0–15.0)
MCH: 32.6 pg (ref 26.0–34.0)
MCHC: 31.7 g/dL (ref 30.0–36.0)
MCV: 103 fL — ABNORMAL HIGH (ref 80.0–100.0)
Platelets: 167 10*3/uL (ref 150–400)
RBC: 2.33 MIL/uL — ABNORMAL LOW (ref 3.87–5.11)
RDW: 17 % — ABNORMAL HIGH (ref 11.5–15.5)
WBC: 10.8 10*3/uL — ABNORMAL HIGH (ref 4.0–10.5)
nRBC: 0.4 % — ABNORMAL HIGH (ref 0.0–0.2)

## 2020-07-30 LAB — ECHOCARDIOGRAM COMPLETE
Area-P 1/2: 3.2 cm2
Height: 61 in
MV M vel: 6.15 m/s
MV Peak grad: 151.3 mmHg
S' Lateral: 2.74 cm
Weight: 1890.66 oz

## 2020-07-30 LAB — CULTURE, BLOOD (ROUTINE X 2)
Culture: NO GROWTH
Culture: NO GROWTH
Special Requests: ADEQUATE
Special Requests: ADEQUATE

## 2020-07-30 MED ORDER — LORAZEPAM 2 MG/ML IJ SOLN
INTRAMUSCULAR | Status: AC
  Administered 2020-07-30: 1 mg via INTRAVENOUS
  Filled 2020-07-30: qty 1

## 2020-07-30 MED ORDER — LORAZEPAM 2 MG/ML IJ SOLN: 1.0000 mg | Freq: Once | INTRAMUSCULAR | Status: AC

## 2020-07-30 MED ORDER — LEVETIRACETAM IN NACL 500 MG/100ML IV SOLN
500.0000 mg | Freq: Two times a day (BID) | INTRAVENOUS | Status: DC
  Administered 2020-07-30 – 2020-07-31 (×2): 500 mg via INTRAVENOUS
  Filled 2020-07-30 (×2): qty 100

## 2020-07-30 NOTE — Progress Notes (Signed)
Called to room by speech therapist, pt actively seizing. Left side face and body jerking, eyes rolled back in head. Episode lasted approximately 30 - 45 seconds then stopped. Pt with deep, snoring respirations after end of active seizure, no eye contact, not responsive to voice. VS just as seizure was ending were b/p 212/77, HR 78, SaO2 85% RA. Five minutes post seizure: b/p 182/86, HR 101, resp 20, nonlabored, SaO2 96% on room air.  Eyes closed, does not follow commands but will hold eyes shut when I try to open eyelids. MD [facility] notified.

## 2020-07-30 NOTE — Progress Notes (Signed)
1525: Pt's resp 21/min and deep but SaO2 remains in low 80's. Pt repositioned, HOB up, SaO2 still low to mid 80's. No cyanosis. MD [facility] notified and order received for O2 supplementation. Initially placed 2 lpm [place] but SaO2 did not increase above 84% after 4-5 minutes. Pt then placed on 15 lpm [place] with increase in SaO2 to 94%, continued for 35 minutes then able to wean down to 3 lpm [place] with SaO2 of 93%. No further seizure activity noted. Pt remains unresponsive, post-ictal.

## 2020-07-30 NOTE — Progress Notes (Signed)
*  PRELIMINARY RESULTS* Echocardiogram 2D Echocardiogram has been performed.  [name] 07/30/2020, 1:42 PM

## 2020-07-30 NOTE — Progress Notes (Signed)
PROGRESS NOTE  [name] ZOX:[id number] DOB: 1928-05-12 DOA: 07/06/2020 PCP: [name], NP  Brief History:  87 y.o.femalewith medical history ofdementia, CKD stage III, hypertension, hyperlipidemia, peripheral vascular disease, atrial fibrillation presenting with low hemoglobin.  At baseline, the patient is essentially bedbound/wheelchair bound. She requires significant assistance for activities of daily living, but she is able to feed herself. Interestingly, the patient son is the primary caregiver, but the patient's daughter provides much better history. Daughterstates that the patient has not been complaining of any chest pain, shortness breath, vomiting, diarrhea, dysuria. He has not noted any hematochezia, hematuria, hematemesis, epistaxis, vaginal bleeding. shestates that the patient had been recently started on an antibiotic for her leg wound. There is been no other new medications.  Daughter states that about a week ago, the patient went to see her PCP for routine visit. There were no able to obtain blood from venipuncture. As result, home health nurse was sent to the home for INR check and routine blood work. This was performed on 07/24/2020. Once the CBC resulted (5.0), EMS was activated by [facility].Family states that the patient has been eating fairly well at baseline. Denies any recent trauma to the patient. However, they suspected the patient has been trying to get out of bed and may have hit the side rails on her hospital bed.  Once the patient arrived to the floor, I was contacted by RN to see the patient secondary to a large hematoma noted on the patient's right thoracic cage area involving her right breast. Interestingly, the patient's son who is a primary caregiver was not aware of this hematoma. Also the patient was noted to have hypothermia with temperature of 93.2 F. Patient was given vitamin K to reverse warfarin 3/25.  Her Hgb remained stable  after vitamin K and PRBC transfusion.  On 3/27 evening, patient had altered mental status.  MRI brain on 07/28/20 showed evidence of cardioembolic stroke.  Her mental status continued to decline.  Palliative medicine was consulted.  Assessment/Plan: Acute blood loss anemia -Per ED provider, FOBT was negative -due to chest wall hematoma -2 units PRBC ordered -Given vitamin K -continue Holding Coumadin -Hgb largely stable since transfusion  Acute on chronic renal failure--CKD stage IIIb -Secondary to hemodynamic changes and volume depletion -Baseline hemoglobin 1.5-1.8 -Presented with hemoglobin 4.67 -Renal ultrasound--Nondiagnostic evaluation. Kidneys and bladder could not be identified, and evaluation is limited due to inability to position the patient. -no hydronephrosis on CT abd -patient is not a dialysis candidate  Coagulopathy/supratherapeutic INR -INR 9.5>>1.3 -vitamin K given -continue holding warfarin -started PPI -restart AC pending palliative discussions with family  Acute metabolic Encephalopathy -3/28 repeat UA/culture-->follow culture, UA 21-50WBC -MR brain-->acute confluent right parieto-occipital white matter infarct and multiple additional punctate acute infarcts in bilateral frontoparietal lobes and inferior cerebellum. Given the distribution, infarcts may be embolic -B12--being repleted -replete folate -TSH 1.831 -multifactorial including including ABLA and cardioembolic stroke -remains somnolent; does not speak or follow commands; awakens to voice -now with postictal event  Acute Cardioembolic Stroke -neurology consult appreciated -carotid US--no hemodynamically significant stenosis -Echo -PT-->SNF -Speech eval-->NPO -restart AC pending palliative meeting with family -family inclining to hospice -no anticoagulation to be pursuit  Seizure -PRN lorazepam and keppra ordered -follow response  Metabolic acidosis -Started IV  bicarbonate>>discontinue 3/28 -stable now -continue to level  Right ankle wound -see pictures -wound care consult appreciated, will follow care recommendations.  Leukemoid reaction -Check PCT--0.37 -3/25--UA  and urine culture--unrevealing -Personally reviewed chest x-ray--extremely rotated, questionRLL effusion/infiltrate -3/26 CT chest--8.3 x 10.2 x 9.2 cm right chest hematoma likely a intramuscular hematoma centered between the pectoralis major and minor musculature, extending into the deep axilla; bipical scarring;  Mild centrilobular emphysema -initially started on empiric cefepime-->stopped 3/27 -overall improved--continue to monitor off abx  Hypothermia -am cortisol 30.2 -blood cultures neg -TSH 1.831 -3/25 urine culture unrevealing -improved  Macrocytic anemia -Check B12--129-->started B12 -Check folic acid--6.2  Essential hypertension -continue IV lopressor -resume amlodipine whe able to tolerate PO's  Dementia without behavioral disturbance -Continue Aricept when able to tolerate Po  Hyperlipidemia -Continue statin when able to tolerate PO's  Chronic atrial fibrillation -Holding Coumadin secondary to coagulopathy -looking to transition into hospice    Status is: Inpatient  Remains inpatient appropriate because:Altered mental status   Dispo: The patient is from: Home              Anticipated d/c is to: Home with hospice.              Patient currently is not medically stable to d/c.   Difficult to place patient No     Family Communication: sister updated 3/29  Consultants:  Neurology; palliative  Code Status:  DNR  DVT Prophylaxis:  SCD, warfarin on hold   Procedures: As Listed in Progress Note Above  Antibiotics: cefepim 3/25>>3/27    Subjective: New seizure activity appreciated, not following commands, not safe to tolerates PO's. Positive post ictal state.  Objective: Vitals:   07/30/20 1435 07/30/20 1440 07/30/20 1457  07/30/20 1524  BP: (!) 212/77 (!) 182/86 (!) 173/77 (!) (P) 171/78  Pulse: (!) 112 86 95 (!) (P) 107  Resp: (!) 24 20 20  (!) (P) 21  Temp:   99.3 F (37.4 C)   TempSrc:   Rectal   SpO2: (!) 87% 100% 97%   Weight:      Height:        Intake/Output Summary (Last 24 hours) at 07/30/2020 1642 Last data filed at 07/30/2020 1338 Gross per 24 hour  Intake 1167.54 ml  Output 400 ml  Net 767.54 ml   Weight change:   Exam: General exam: not following commands, post ictal, not tolerating PO's. New seizure activity appreciated. Respiratory system: no wheezing, no crackles, 2L [place] in place, o2 sat 88% on RA. Cardiovascular system:RRR. No murmurs, rubs, gallops. Gastrointestinal system: Abdomen is nondistended, soft and nontender. No organomegaly or masses felt. Normal bowel sounds heard. Central nervous system: unable to properly assess with current mentation. Extremities: No cyanosis, no clubbing. Skin: No petechiae, dry open wound in her ankle, no active drainage, no signs of superimposed infection. Wound POA. Psychiatry: not following commands, confused, post ictal   Data Reviewed: I have personally reviewed following labs and imaging studies  Basic Metabolic Panel: Recent Labs  Lab 07/26/20 0513 07/27/20 0753 07/28/20 0418 07/29/20 0627 07/30/20 0528  NA 142 139 141 141 143  K 3.7 3.4* 3.5 3.3* 3.7  CL 110 106 103 105 108  CO2 20* 22 25 24 22   GLUCOSE 176* 95 99 90 98  BUN 66* 58* 53* 47* 44*  CREATININE 4.37* 4.05* 3.61* 3.29* 3.09*  CALCIUM 7.4* 6.8* 6.7* 6.7* 7.3*   Liver Function Tests: Recent Labs  Lab 07/24/20 1125 11-05-20 1024 07/27/20 0753 07/28/20 0418 07/29/20 0627  AST 13* 21 27 25 27   ALT 7 9 17 16 16   ALKPHOS 52 59 56 59 64  BILITOT 0.3 0.4 0.6 0.7  1.1  PROT 5.1* 5.4* 5.0* 5.2* 5.1*  ALBUMIN 2.0* 2.2* 2.1* 2.1* 2.0*   Coagulation Profile: Recent Labs  Lab 07/24/20 1140 16-Aug-2020 1024 07/26/20 0513  INR 8.0* 9.5* 1.3*   CBC: Recent Labs   Lab 07/24/20 1125 Aug 16, 2020 1024 07/26/20 0513 07/27/20 0753 07/28/20 0418 07/29/20 0627 07/30/20 0528  WBC 8.6 13.5* 14.1* 11.7* 11.4* 11.1* 10.8*  NEUTROABS 6.9 11.0*  --   --   --   --   --   HGB 5.0* 4.0* 7.6* 7.4* 7.2* 7.7* 7.6*  HCT 15.8* 14.1* 22.6* 21.8* 22.0* 24.2* 24.0*  MCV 99.4 111.9* 97.0 97.3 98.7 100.0 103.0*  PLT 289 334 251 214 196 174 167   CBG: Recent Labs  Lab 08-16-2020 0945 07/28/20 1935 07/29/20 1217 07/29/20 1630  GLUCAP 106* 92 75 70   Urine analysis:    Component Value Date/Time   COLORURINE YELLOW 07/28/2020 1451   APPEARANCEUR HAZY (A) 07/28/2020 1451   LABSPEC 1.009 07/28/2020 1451   PHURINE 8.0 07/28/2020 1451   GLUCOSEU NEGATIVE 07/28/2020 1451   HGBUR MODERATE (A) 07/28/2020 1451   BILIRUBINUR NEGATIVE 07/28/2020 1451   BILIRUBINUR neg 04/13/2012 1612   KETONESUR NEGATIVE 07/28/2020 1451   PROTEINUR 100 (A) 07/28/2020 1451   UROBILINOGEN 0.2 10/04/2012 1514   NITRITE NEGATIVE 07/28/2020 1451   LEUKOCYTESUR TRACE (A) 07/28/2020 1451   Sepsis Labs:  Recent Results (from the past 240 hour(s))  Culture, Urine     Status: Abnormal   Collection Time: 2020-08-16 10:35 AM   Specimen: Urine, Clean Catch  Result Value Ref Range Status   Specimen Description   Final    URINE, CLEAN CATCH Performed at [facility], [address], [place], [place] [address]    Special Requests   Final    NONE Performed at [facility], [address] [place], [place] [address]    Culture MULTIPLE SPECIES PRESENT, SUGGEST RECOLLECTION (A)  Final   Report Status 07/28/2020 FINAL  Final  MRSA PCR Screening     Status: None   Collection Time: 08/16/2020  3:37 PM   Specimen: Nasal Mucosa; Nasopharyngeal  Result Value Ref Range Status   MRSA by PCR NEGATIVE NEGATIVE Final    Comment:        The GeneXpert MRSA Assay (FDA approved for NASAL specimens only), is one component of a comprehensive MRSA colonization surveillance program. It is not intended to  diagnose MRSA infection nor to guide or monitor treatment for MRSA infections. Performed at [facility], [address] [place], [place] [address]   Culture, blood (Routine X 2) w Reflex to ID Panel     Status: None   Collection Time: August 16, 2020  8:11 PM   Specimen: BLOOD LEFT HAND  Result Value Ref Range Status   Specimen Description BLOOD LEFT HAND  Final   Special Requests   Final    BOTTLES DRAWN AEROBIC ONLY Blood Culture adequate volume   Culture   Final    NO GROWTH 5 DAYS Performed at [facility], [address] [place], [place] [address]    Report Status 07/30/2020 FINAL  Final  Culture, blood (Routine X 2) w Reflex to ID Panel     Status: None   Collection Time: Aug 16, 2020  8:21 PM   Specimen: BLOOD LEFT ARM  Result Value Ref Range Status   Specimen Description BLOOD LEFT ARM  Final   Special Requests   Final    BOTTLES DRAWN AEROBIC ONLY Blood Culture adequate volume  Culture   Final    NO GROWTH 5 DAYS Performed at [facility], [address] [place], [place] [address]    Report Status 07/30/2020 FINAL  Final  Culture, Urine     Status: Abnormal   Collection Time: 07/28/20  2:51 PM   Specimen: Urine, Catheterized  Result Value Ref Range Status   Specimen Description   Final    URINE, CATHETERIZED Performed at [facility], [address] [place], [place] [address]    Special Requests   Final    NONE Performed at [facility], [address] [place], [place] [address]    Culture 60,000 COLONIES/mL YEAST (A)  Final   Report Status 07/30/2020 FINAL  Final    Scheduled Meds: . amLODipine  10 mg Oral Daily  . Chlorhexidine Gluconate Cloth  6 each Topical Daily  . cyanocobalamin  1,000 mcg Subcutaneous Daily  . donepezil  10 mg Oral Daily  . metoprolol tartrate  2.5 mg Intravenous Q6H  . pantoprazole (PROTONIX) IV  40 mg Intravenous Q24H  . pravastatin  10 mg Oral Daily   Continuous Infusions: . dextrose 5 % and 0.9 % NaCl with KCl 20 mEq/L 75 mL/hr at  07/30/20 0653  . levETIRAcetam 500 mg (07/30/20 1515)    Procedures/Studies: CT ABDOMEN PELVIS WO CONTRAST  Result Date: 07/26/2020 CLINICAL DATA:  Anemia, supratherapeutic INR, concern for retroperitoneal hematoma EXAM: CT CHEST, ABDOMEN AND PELVIS WITHOUT CONTRAST TECHNIQUE: Multidetector CT imaging of the chest, abdomen and pelvis was performed following the standard protocol without IV contrast. COMPARISON:  CT chest dated 07/15/2018 FINDINGS: CT CHEST FINDINGS Cardiovascular: Cardiomegaly with left atrial enlargement. No pericardial effusion. No evidence of thoracic aortic aneurysm. Atherosclerotic calcifications of the aortic arch. Coronary atherosclerosis of the LAD and right coronary artery. Mediastinum/Nodes: No suspicious mediastinal lymphadenopathy. Visualized thyroid is unremarkable. Lungs/Pleura: Biapical pleural-parenchymal scarring. Trace chronic right pleural fluid with scarring/volume loss in the right lower lobe, improved. Mild centrilobular emphysematous changes in the upper lobes. Mild subpleural reticulation in the lungs bilaterally. Evaluation lung parenchyma is constrained by respiratory motion. Within that constraint, there are no suspicious pulmonary nodules. No pneumothorax. Musculoskeletal: 8.3 x 10.2 x 9.2 cm right chest hematoma (series 3/image 8), likely a intramuscular hematoma centered between the pectoralis major and minor musculature, extending into the deep axilla. Surrounding fluid/chest wall edema extending inferiorly along the dependent right anterior abdominal wall (series 3/image 44). Mild degenerative changes of the visualized thoracolumbar spine. Moderate superior endplate compression fracture deformity at T12, chronic. No retropulsion. CT ABDOMEN PELVIS FINDINGS Hepatobiliary: Unenhanced liver is unremarkable. Gallbladder is grossly unremarkable. No intrahepatic or extrahepatic ductal dilatation. Pancreas: Grossly unremarkable. Spleen: Within normal limits.  Adrenals/Urinary Tract: Adrenal glands are within normal limits. Kidneys are within normal limits. No renal calculi or hydronephrosis. Bladder is within normal limits. Stomach/Bowel: Stomach is within normal limits. No evidence of bowel obstruction. Appendix is not discretely visualized. Sigmoid diverticulosis, without evidence of diverticulitis. Vascular/Lymphatic: No evidence of abdominal aortic aneurysm. Atherosclerotic calcifications of the abdominal aorta and branch vessels. No suspicious abdominopelvic lymphadenopathy. Reproductive: Suspected prior hysterectomy. No adnexal masses. Other: No abdominopelvic ascites. No free air. Musculoskeletal: Degenerative changes of the lumbar spine. IMPRESSION: 10.2 cm intramuscular hematoma in the right chest wall, as described above, likely secondary to the patient's supratherapeutic INR. Additional chronic ancillary findings, as described above. Aortic Atherosclerosis (ICD10-I70.0) and Emphysema (ICD10-J43.9). Electronically Signed   By: Charline Bills M.D.   On: 07/26/2020 06:11   CT Head Wo Contrast  Result Date: 07/29/2020 CLINICAL DATA:  Altered mental status EXAM: CT HEAD WITHOUT CONTRAST TECHNIQUE: Contiguous axial images were obtained from the base of the skull through the vertex without intravenous contrast. COMPARISON:  None. FINDINGS: Brain: No evidence of acute infarction, hemorrhage, hydrocephalus, extra-axial collection or mass lesion/mass effect. Advanced low-density changes within the periventricular and subcortical white matter compatible with chronic microvascular ischemic change. Moderate diffuse cerebral volume loss. Vascular: Atherosclerotic calcifications involving the large vessels of the skull base. No unexpected hyperdense vessel. Skull: Normal. Negative for fracture or focal lesion. Sinuses/Orbits: Complete opacification of the bilateral mastoid air cells. Right maxillary mucous retention cyst. Other: None. IMPRESSION: 1. No acute  intracranial findings. 2. Advanced chronic microvascular ischemic change and cerebral volume loss. 3. Complete opacification of the bilateral mastoid air cells. Electronically Signed   By: Duanne Guess D.O.   On: 07/15/2020 12:40   CT CHEST WO CONTRAST  Result Date: 07/26/2020 CLINICAL DATA:  Anemia, supratherapeutic INR, concern for retroperitoneal hematoma EXAM: CT CHEST, ABDOMEN AND PELVIS WITHOUT CONTRAST TECHNIQUE: Multidetector CT imaging of the chest, abdomen and pelvis was performed following the standard protocol without IV contrast. COMPARISON:  CT chest dated 07/15/2018 FINDINGS: CT CHEST FINDINGS Cardiovascular: Cardiomegaly with left atrial enlargement. No pericardial effusion. No evidence of thoracic aortic aneurysm. Atherosclerotic calcifications of the aortic arch. Coronary atherosclerosis of the LAD and right coronary artery. Mediastinum/Nodes: No suspicious mediastinal lymphadenopathy. Visualized thyroid is unremarkable. Lungs/Pleura: Biapical pleural-parenchymal scarring. Trace chronic right pleural fluid with scarring/volume loss in the right lower lobe, improved. Mild centrilobular emphysematous changes in the upper lobes. Mild subpleural reticulation in the lungs bilaterally. Evaluation lung parenchyma is constrained by respiratory motion. Within that constraint, there are no suspicious pulmonary nodules. No pneumothorax. Musculoskeletal: 8.3 x 10.2 x 9.2 cm right chest hematoma (series 3/image 8), likely a intramuscular hematoma centered between the pectoralis major and minor musculature, extending into the deep axilla. Surrounding fluid/chest wall edema extending inferiorly along the dependent right anterior abdominal wall (series 3/image 44). Mild degenerative changes of the visualized thoracolumbar spine. Moderate superior endplate compression fracture deformity at T12, chronic. No retropulsion. CT ABDOMEN PELVIS FINDINGS Hepatobiliary: Unenhanced liver is unremarkable. Gallbladder  is grossly unremarkable. No intrahepatic or extrahepatic ductal dilatation. Pancreas: Grossly unremarkable. Spleen: Within normal limits. Adrenals/Urinary Tract: Adrenal glands are within normal limits. Kidneys are within normal limits. No renal calculi or hydronephrosis. Bladder is within normal limits. Stomach/Bowel: Stomach is within normal limits. No evidence of bowel obstruction. Appendix is not discretely visualized. Sigmoid diverticulosis, without evidence of diverticulitis. Vascular/Lymphatic: No evidence of abdominal aortic aneurysm. Atherosclerotic calcifications of the abdominal aorta and branch vessels. No suspicious abdominopelvic lymphadenopathy. Reproductive: Suspected prior hysterectomy. No adnexal masses. Other: No abdominopelvic ascites. No free air. Musculoskeletal: Degenerative changes of the lumbar spine. IMPRESSION: 10.2 cm intramuscular hematoma in the right chest wall, as described above, likely secondary to the patient's supratherapeutic INR. Additional chronic ancillary findings, as described above. Aortic Atherosclerosis (ICD10-I70.0) and Emphysema (ICD10-J43.9). Electronically Signed   By: Charline Bills M.D.   On: 07/26/2020 06:11   MR ANGIO HEAD WO CONTRAST  Result Date: 07/28/2020 CLINICAL DATA:  Neuro deficit, acute stroke suspected. Facial droop and altered mental status. EXAM: MRI HEAD WITHOUT CONTRAST MRA HEAD WITHOUT CONTRAST TECHNIQUE: Multiplanar, multiecho pulse sequences of the brain and surrounding structures were obtained without intravenous contrast. Angiographic images of the head were obtained using MRA technique without contrast. COMPARISON:  CT head July 25, 2020. FINDINGS: MRI HEAD FINDINGS Motion limited study. Brain:  Confluent restricted diffusion in the right parieto-occipital region, which appears to predominantly involve white matter. Multiple bilateral punctate acute infarcts involving bilateral frontal and parietal lobes (both cortex and white matter).  Multiple punctate infarcts involving the inferior cerebellum bilaterally (series 5, image 5 and series 7, image 6 and 8). No substantial mass effect. No evidence of acute hemorrhage. Advanced scattered T2/FLAIR hyperintensity within the white matter and pons, most likely related to chronic microvascular ischemic disease. No evidence of mass lesion. No midline shift. Generalized cerebral volume loss with ex vacuo ventricular dilation. No hydrocephalus. Vascular: Major arterial flow voids are maintained at the skull base. Skull and upper cervical spine: Normal marrow signal. Sinuses/Orbits: Inferior right maxillary sinus mucosal cyst. Otherwise, sinuses are largely clear. Unremarkable orbits. Other: Bilateral mastoid effusions. MRA HEAD FINDINGS Anterior circulation: Patent bilateral ICAs. Patent MCAs and ACAs with limited evaluation for stenosis or aneurysm due to motion Posterior circulation: Intradural vertebral arteries are partially imaged. The right vertebral artery appears to largely terminate as PICA. The visualized intradural left vertebral artery is patent. The basilar artery and proximal bilateral posterior cerebral arteries are patent. Suspected moderate stenosis of the left P2 PCA IMPRESSION: MRI head: 1. Motion limited study with evidence of acute confluent right parieto-occipital white matter infarct and multiple additional punctate acute infarcts in bilateral frontoparietal lobes and inferior cerebellum. Given the distribution, infarcts may be embolic and/or watershed in etiology. No substantial mass effect. 2. Advanced chronic microvascular ischemic disease. 3. Bilateral mastoid effusions. MRA: 1. Motion limited evaluation without evidence of emergent large vessel occlusion. 2. Possible moderate stenosis of the left P2 PCA. Otherwise, limited evaluation for stenosis (particularly in the anterior circulation) given motion. Follow-up CTA could provide more sensitive evaluation if clinically indicated.  These results will be called to the ordering clinician or representative by the Radiologist Assistant, and communication documented in the PACS or [employer]. Electronically Signed   By: Feliberto Harts MD   On: 07/28/2020 13:46   MR BRAIN WO CONTRAST  Result Date: 07/28/2020 CLINICAL DATA:  Neuro deficit, acute stroke suspected. Facial droop and altered mental status. EXAM: MRI HEAD WITHOUT CONTRAST MRA HEAD WITHOUT CONTRAST TECHNIQUE: Multiplanar, multiecho pulse sequences of the brain and surrounding structures were obtained without intravenous contrast. Angiographic images of the head were obtained using MRA technique without contrast. COMPARISON:  CT head July 25, 2020. FINDINGS: MRI HEAD FINDINGS Motion limited study. Brain: Confluent restricted diffusion in the right parieto-occipital region, which appears to predominantly involve white matter. Multiple bilateral punctate acute infarcts involving bilateral frontal and parietal lobes (both cortex and white matter). Multiple punctate infarcts involving the inferior cerebellum bilaterally (series 5, image 5 and series 7, image 6 and 8). No substantial mass effect. No evidence of acute hemorrhage. Advanced scattered T2/FLAIR hyperintensity within the white matter and pons, most likely related to chronic microvascular ischemic disease. No evidence of mass lesion. No midline shift. Generalized cerebral volume loss with ex vacuo ventricular dilation. No hydrocephalus. Vascular: Major arterial flow voids are maintained at the skull base. Skull and upper cervical spine: Normal marrow signal. Sinuses/Orbits: Inferior right maxillary sinus mucosal cyst. Otherwise, sinuses are largely clear. Unremarkable orbits. Other: Bilateral mastoid effusions. MRA HEAD FINDINGS Anterior circulation: Patent bilateral ICAs. Patent MCAs and ACAs with limited evaluation for stenosis or aneurysm due to motion Posterior circulation: Intradural vertebral arteries are partially  imaged. The right vertebral artery appears to largely terminate as PICA. The visualized intradural left vertebral artery is patent. The basilar artery and  proximal bilateral posterior cerebral arteries are patent. Suspected moderate stenosis of the left P2 PCA IMPRESSION: MRI head: 1. Motion limited study with evidence of acute confluent right parieto-occipital white matter infarct and multiple additional punctate acute infarcts in bilateral frontoparietal lobes and inferior cerebellum. Given the distribution, infarcts may be embolic and/or watershed in etiology. No substantial mass effect. 2. Advanced chronic microvascular ischemic disease. 3. Bilateral mastoid effusions. MRA: 1. Motion limited evaluation without evidence of emergent large vessel occlusion. 2. Possible moderate stenosis of the left P2 PCA. Otherwise, limited evaluation for stenosis (particularly in the anterior circulation) given motion. Follow-up CTA could provide more sensitive evaluation if clinically indicated. These results will be called to the ordering clinician or representative by the Radiologist Assistant, and communication documented in the PACS or [employer]. Electronically Signed   By: Feliberto Harts MD   On: 07/28/2020 13:46   US RENAL  Result Date: 07/15/2020 CLINICAL DATA:  Acute on chronic renal failure, tobacco abuse, chronic renal insufficiency EXAM: RENAL / URINARY TRACT ULTRASOUND COMPLETE COMPARISON:  None. FINDINGS: Right Kidney: Not visualized. Limited by patient cooperation and inability to position the patient. Left Kidney: Not visualized. Limited by patient cooperation and inability to position the patient. Bladder: Not visualized. Limited by patient cooperation and inability to position patient. Other: None. IMPRESSION: 1. Nondiagnostic evaluation. Kidneys and bladder could not be identified, and evaluation is limited due to inability to position the patient. Electronically Signed   By: Sharlet Salina M.D.    On: 07/08/2020 15:15   US Carotid Bilateral  Result Date: 07/29/2020 CLINICAL DATA:  Hypertension Hyperlipidemia Tobacco use Dementia Peripheral vascular disease Neurologic deficit Acute stroke suspected Facial droop Altered mental status EXAM: BILATERAL CAROTID DUPLEX ULTRASOUND TECHNIQUE: Wallace Cullens scale imaging, color Doppler and duplex ultrasound were performed of bilateral carotid and vertebral arteries in the neck. COMPARISON:  None. FINDINGS: Criteria: Quantification of carotid stenosis is based on velocity parameters that correlate the residual internal carotid diameter with NASCET-based stenosis levels, using the diameter of the distal internal carotid lumen as the denominator for stenosis measurement. The following velocity measurements were obtained: RIGHT ICA: 70/8 cm/sec CCA: 86/5 cm/sec SYSTOLIC ICA/CCA RATIO:  0.8 ECA: 52 cm/sec LEFT ICA: 90/13 cm/sec CCA: 76/7 cm/sec SYSTOLIC ICA/CCA RATIO:  1.2 ECA: 40 cm/sec RIGHT CAROTID ARTERY: Mild atherosclerotic plaque of the right carotid bulb without flow-limiting stenosis. RIGHT VERTEBRAL ARTERY:  Antegrade flow. LEFT CAROTID ARTERY: Mild atheromatous plaque of the distal common and proximal internal carotid arteries without high-grade stenosis. LEFT VERTEBRAL ARTERY:  Antegrade flow. IMPRESSION: Less than 50% stenosis of the internal carotid arteries. Electronically Signed   By: Acquanetta Belling M.D.   On: 07/29/2020 10:45   DG Chest Portable 1 View  Result Date: 07/19/2020 CLINICAL DATA:  Altered mental status EXAM: PORTABLE CHEST 1 VIEW COMPARISON:  Chest CT from July 15, 2018 FINDINGS: Limited rotated chest. Asymmetric density at the right base with costophrenic sulcus blunting. No edema or pneumothorax. Large volume lungs in general. IMPRESSION: 1. Limited rotated chest. 2. Small right pleural effusion which could be chronic or related to scarring based on a 2020 chest CT. Electronically Signed   By: Marnee Spring M.D.   On: 07/11/2020 10:48    ECHOCARDIOGRAM COMPLETE  Result Date: 07/30/2020    ECHOCARDIOGRAM REPORT   Patient Name:   [name] Date of Exam: 07/30/2020 Medical Rec #:  [id number]     Height:       61.0 in Accession #:  [id number]    Weight:       118.2 lb Date of Birth:  15-Sep-1928     BSA:          1.510 m Patient Age:    89 years      BP:           178/66 mmHg Patient Gender: F             HR:           79 bpm. [facility] Procedure: 2D Echo Indications:    Stroke I63.9  History:        Patient has prior history of Echocardiogram examinations, most                 recent 05/01/2010. Stroke, Arrythmias:Atrial Fibrillation; Risk                 Factors:Non-Smoker, Hypertension and Dyslipidemia.  Sonographer:    Jeryl Columbia RDCS (AE) Referring Phys: [phone] DAVID TAT IMPRESSIONS  1. Left ventricular ejection fraction, by estimation, is 60 to 65%. The left ventricle has normal function. The left ventricle has no regional wall motion abnormalities. There is mild left ventricular hypertrophy. Left ventricular diastolic parameters are indeterminate.  2. Right ventricular systolic function is normal. The right ventricular size is normal. There is moderately elevated pulmonary artery systolic pressure. The estimated right ventricular systolic pressure is 47.1 mmHg.  3. Left atrial size was moderately dilated.  4. The mitral valve is degenerative. Mild mitral valve regurgitation.  5. The aortic valve is tricuspid. Aortic valve regurgitation is not visualized.  6. The inferior vena cava is normal in size with greater than 50% respiratory variability, suggesting right atrial pressure of 3 mmHg. FINDINGS  Left Ventricle: Left ventricular ejection fraction, by estimation, is 60 to 65%. The left ventricle has normal function. The left ventricle has no regional wall motion abnormalities. The left ventricular internal cavity size was normal in size. There is  mild left ventricular hypertrophy. Left ventricular diastolic  parameters are indeterminate. Right Ventricle: The right ventricular size is normal. No increase in right ventricular wall thickness. Right ventricular systolic function is normal. There is moderately elevated pulmonary artery systolic pressure. The tricuspid regurgitant velocity is 3.32 m/s, and with an assumed right atrial pressure of 3 mmHg, the estimated right ventricular systolic pressure is 47.1 mmHg. Left Atrium: Left atrial size was moderately dilated. Right Atrium: Right atrial size was normal in size. Pericardium: There is no evidence of pericardial effusion. Mitral Valve: The mitral valve is degenerative in appearance. There is mild thickening of the mitral valve leaflet(s). There is mild calcification of the mitral valve leaflet(s). Mild mitral valve regurgitation. Tricuspid Valve: The tricuspid valve is grossly normal. Tricuspid valve regurgitation is trivial. Aortic Valve: The aortic valve is tricuspid. There is mild aortic valve annular calcification. Aortic valve regurgitation is not visualized. Pulmonic Valve: The pulmonic valve was grossly normal. Pulmonic valve regurgitation is trivial. Aorta: The aortic root is normal in size and structure. Venous: The inferior vena cava is normal in size with greater than 50% respiratory variability, suggesting right atrial pressure of 3 mmHg. IAS/Shunts: No atrial level shunt detected by color flow Doppler.  LEFT VENTRICLE PLAX 2D LVIDd:         3.58 cm  Diastology LVIDs:         2.74 cm  LV e' medial:    6.32 cm/s LV PW:  1.24 cm  LV E/e' medial:  22.0 LV IVS:        0.82 cm  LV e' lateral:   7.22 cm/s LVOT diam:     1.80 cm  LV E/e' lateral: 19.3 LVOT Area:     2.54 cm  RIGHT VENTRICLE RV S prime:     9.77 cm/s TAPSE (M-mode): 1.7 cm LEFT ATRIUM             Index       RIGHT ATRIUM           Index LA diam:        3.70 cm 2.45 cm/m  RA Area:     16.50 cm LA Vol (A2C):   75.8 ml 50.19 ml/m RA Volume:   44.80 ml  29.66 ml/m LA Vol (A4C):   70.4 ml  46.61 ml/m LA Biplane Vol: 74.8 ml 49.53 ml/m   AORTA Ao Root diam: 2.80 cm MITRAL VALVE                TRICUSPID VALVE MV Area (PHT): 3.20 cm     TR Peak grad:   44.1 mmHg MV Decel Time: 237 msec     TR Vmax:        332.00 cm/s MR Peak grad: 151.3 mmHg MR Vmax:      615.00 cm/s   SHUNTS MV E velocity: 139.00 cm/s  Systemic Diam: 1.80 cm MV A velocity: 66.20 cm/s MV E/A ratio:  2.10 Nona Dell MD Electronically signed by Nona Dell MD Signature Date/Time: 07/30/2020/4:10:44 PM    Final     Vassie Loll, MD  [facility]  If 7PM-7AM, please contact night-coverage [email or web address] Password [email or web address] 07/30/2020, 4:42 PM   LOS: 5 days

## 2020-07-30 NOTE — Progress Notes (Addendum)
Palliative:  [name] is lying quietly in bed with nursing staff at bedside attending to personal needs.  Her eyes are open, but she does not make eye contact.  She does not try to communicate in any meaningful way, and is unable to make her basic needs known.  There is no family at bedside at this time.  Call to daughter, [name].  We talked about speech therapy consult yesterday, and that they will return to evaluate [name] today.  We again talked about [name] stroke, time for recovery, swallowing function.  We also talked about disposition.  [name] again states that her mother would return home with aide services already in place with hospice care.  We talked about indwelling Foley catheter, risks versus benefits.  Daughter is agreeable to remove Foley cath due to risk for infection. Multiple testing yesterday including Echo, carotid US, ST, PT consults. ST following up today.  While working with ST patient noted to have seizure. Attending at bedside, discussed GOC.   Conference with attending, bedside nursing staff, transition of care team related to patient condition, needs, goals of care, disposition.  Plan: Continue to treat the treatable but no CPR or intubation.  No PEG tube for artificial feeding.  [name] verifies return home with CAP aid already in place and the benefits of hospice care with hospice of [facility]  40 minutes  Lillia Carmel, NP Palliative medicine team Team phone [phone] Greater than 50% of this time was spent counseling and coordinating care related to the above assessment and plan.

## 2020-07-30 NOTE — Progress Notes (Signed)
Foley cath discontinued, purewick placed. Pt with nonblanchable red spots 2x2cm on each hip, foam dressing placed. Also noted reddness to left heel, heel foam place and foot repositioned for pressure relief. Pt will not keep foot on pillow. Oral care provided, pt clamps mouth shut when you attempt to put swab in mouth. Right AC IV noted to be leaking, good blood return, when dressing removed noted that pigtail tubing was loose. Tubing tightened, no further leaking noted, IV site redressed. Speech therapy in at this time.

## 2020-07-30 NOTE — TOC Initial Note (Signed)
Transition of Care [facility]) - Initial/Assessment Note    Patient Details  Name: [name] MRN: [id number] Date of Birth: Nov 15, 1928  [facility] CM/SW Contact:    Leitha Bleak, RN Phone Number: 07/30/2020, 4:43 PM  Clinical Narrative:    Palliative consult complete. Family wanting [facility] at home. Referral made to [facility].  Patient does not need any DME's. Patient was ready for discharge and started seizing. Hospice updated we will updated them in the morning with discharge plan.                Expected Discharge Plan: Home w Hospice Care Barriers to Discharge: Continued Medical Work up   Patient Goals and CMS Choice Patient states their goals for this hospitalization and ongoing recovery are:: to go home. CMS Medicare.gov Compare Post Acute Care list provided to:: Patient Represenative (must comment) Choice offered to / list presented to : Adult Children  Expected Discharge Plan and Services Expected Discharge Plan: Home w Hospice Care  Prior Living Arrangements/Services   Activities of Daily Living Home Assistive Devices/Equipment: Hospital bed ADL Screening (condition at time of admission) Patient's cognitive ability adequate to safely complete daily activities?: No Is the patient deaf or have difficulty hearing?: No Does the patient have difficulty seeing, even when wearing glasses/contacts?: Yes Does the patient have difficulty concentrating, remembering, or making decisions?: Yes Patient able to express need for assistance with ADLs?: Yes Does the patient have difficulty dressing or bathing?: Yes Independently performs ADLs?: No Communication: Needs assistance Is this a change from baseline?: Pre-admission baseline Dressing (OT): Dependent Is this a change from baseline?: Pre-admission baseline Grooming: Dependent Is this a change from baseline?: Pre-admission baseline Feeding: Dependent Does the patient have difficulty walking or  climbing stairs?: Yes Weakness of Legs: Both Weakness of Arms/Hands: Both  Permission Sought/Granted      Emotional Assessment     Admission diagnosis:  Acute blood loss anemia [D62] Elevated INR [R79.1] Acute on chronic renal failure (HCC) [N17.9, N18.9] AKI (acute kidney injury) (HCC) [N17.9] Symptomatic anemia [D64.9] Patient Active Problem List   Diagnosis Date Noted  . Cardioembolic stroke (HCC) 07/29/2020  . Acute blood loss anemia 2020-08-02  . Acute renal failure superimposed on stage 3b chronic kidney disease (HCC) August 02, 2020  . Coagulopathy (HCC) 02-Aug-2020  . Elevated INR   . Diarrhea 04/23/2020  . Skin breakdown 04/23/2020  . Cellulitis of right orbital region   . Periorbital cellulitis of left eye 07/15/2018  . CAP (community acquired pneumonia) 07/15/2018  . Impaired gait and mobility 05/24/2017  . Weakness 04/28/2016  . Loss of weight 10/22/2013  . Encounter for therapeutic drug monitoring 06/04/2013  . Dementia (HCC) 05/18/2013  . UTI (urinary tract infection) 10/05/2012  . Chronic insomnia 06/30/2012  . Headache(784.0) 03/03/2012  . Eosinophilia 11/27/2011  . Pruritus 10/25/2011  . Peripheral vascular disease (HCC) 09/29/2011  . Chronic kidney disease   . Tobacco abuse, in remission   . Chronic anticoagulation   . 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 Hyperlipidemia 01/01/2010  . Anemia 04/09/2009  . Hypertension 04/09/2009  . ATRIAL FIBRILLATION, CHRONIC 04/09/2009   PCP:  [name], NP Pharmacy:   [facility] [address] [place] [address] [place] [address] [place] [address] [place] [address] Phone: [phone] Fax: [phone]   Readmission Risk Interventions Readmission Risk Prevention Plan 07/17/2018  Transportation Screening Complete  PCP or Specialist Appt within 5-7 Days Not Complete  Not Complete comments DC  late in day. CM discussed making f/u with [facility]  Home Care Screening Complete   Medication Review (RN CM) Complete  Some recent data might be hidden

## 2020-07-30 NOTE — Progress Notes (Signed)
Pt responded with groan when turned to clean of incontinent urine, but no other verbal response and no eye contact. No further seizure activity noted. Pt with low grade temp 100.2 rectal, MD [place] notified. Dressing changed to wound on right ankle area. Pt also has bruised area right lateral foot, foam applied.

## 2020-07-30 NOTE — Plan of Care (Signed)
Alert but non-verbal. No s/s of pain or discomfort. NPO status maintained. IV fluids infused and tolerated well. Turned and repositioned for skin protection. Problem: Education: Goal: Knowledge of General Education information will improve Description: Including pain rating scale, medication(s)/side effects and non-pharmacologic comfort measures Outcome: Progressing   Problem: Clinical Measurements: Goal: Ability to maintain clinical measurements within normal limits will improve Outcome: Progressing Goal: Will remain free from infection Outcome: Progressing Goal: Diagnostic test results will improve Outcome: Progressing Goal: Respiratory complications will improve Outcome: Progressing Goal: Cardiovascular complication will be avoided Outcome: Progressing   Problem: Nutrition: Goal: Adequate nutrition will be maintained Outcome: Progressing   Problem: Safety: Goal: Ability to remain free from injury will improve Outcome: Progressing   Problem: Skin Integrity: Goal: Risk for impaired skin integrity will decrease Outcome: Progressing

## 2020-07-30 NOTE — Progress Notes (Signed)
  Speech Language Pathology Treatment: Dysphagia  Patient Details Name: [name] MRN: [id number] DOB: July 09, 1928 Today's Date: 07/30/2020 Time: 4315-4008 SLP Time Calculation (min) (ACUTE ONLY): 22 min  Assessment / Plan / Recommendation Clinical Impression  Pt seen for ongoing dysphagia intervention. She was more alert this date, with eyes opened at times and actively closed lips to dry spoon when stimulated. Pt accepted a few bites/sips of honey thick liquid and puree with prolonged oral holding and suspected delay in swallow. Pt without overt coughing or signs of reduced airway protection, however she did start to become less responsive and then appeared to seize. Oral cavity was clear and RN called to room. Pt's eyes rolled back, left hand shaking, head shaking, tongue pale and eventual audible respirations. Trials abandoned. Continue NPO pending family's decision regarding hospice and consider comfort feeds of puree and honey thick liquids and oral care. SLP will follow per goals of care, tomorrow.    HPI HPI: This 87 year old black female who was found to have severe anemia on routine tested by his primary care provider with a hemoglobin of  5. This prompted urgent admission.  The patient is on Coumadin with supratherapeutic INR 9 which is thought to be the source of his anemia particularly also with chest wall hematoma. The warfarin has been discontinued and reversed. The patient also noted to have hypersomnia although he has baseline advanced dementia. Imaging shows evidence of stroke and hence a neurological consultation. At baseline the patient does have cognitive impairment and requires assistance with daily activities and essentially is wheelchair bound/ bed-bound.  The nurse reports that the patient was more responsive during her initial admission.      SLP Plan  Continue with current plan of care       Recommendations  Diet recommendations: NPO (comfort feeds of puree/HTL if  family chooses) Liquids provided via: Teaspoon Medication Administration: Via alternative means Supervision: Full supervision/cueing for compensatory strategies Compensations: Small sips/bites;Slow rate Postural Changes and/or Swallow Maneuvers: Seated upright 90 degrees;Upright 30-60 min after meal                Oral Care Recommendations: Oral care prior to ice chip/H20;Staff/trained caregiver to provide oral care;Oral care QID Follow up Recommendations: 24 hour supervision/assistance SLP Visit Diagnosis: Dysphagia, unspecified (R13.10) Plan: Continue with current plan of care       Thank you,  Havery Moros, CCC-SLP [phone]                 Jasher Barkan 07/30/2020, 2:41 PM

## 2020-07-30 NOTE — Progress Notes (Signed)
1445: MD Madera in room and evaluating patient. Pt remained post-ictal, non-responsive. Pt then began to have new seizure activity, head and face pulling to right side, eyes rolled back in head, active jerking of left arm and leg, upper body. Verbal order received for IV Ativan 1mg  now, med retrieved and administered per order. Pt now post-ictal, non-responsive with snoring respirations, SaO2 100% on room air. MD states will contact pt's daughter with update.

## 2020-07-31 ENCOUNTER — Ambulatory Visit: Payer: Medicare Other | Admitting: Family

## 2020-07-31 DIAGNOSIS — I4891 Unspecified atrial fibrillation: Secondary | ICD-10-CM | POA: Diagnosis not present

## 2020-07-31 DIAGNOSIS — D689 Coagulation defect, unspecified: Secondary | ICD-10-CM | POA: Diagnosis not present

## 2020-07-31 DIAGNOSIS — I639 Cerebral infarction, unspecified: Secondary | ICD-10-CM | POA: Diagnosis not present

## 2020-07-31 DIAGNOSIS — Z7189 Other specified counseling: Secondary | ICD-10-CM | POA: Diagnosis not present

## 2020-07-31 MED ORDER — ACETAMINOPHEN 650 MG RE SUPP: 650.0000 mg | Freq: Four times a day (QID) | RECTAL | Status: DC | PRN

## 2020-07-31 MED ORDER — GLYCOPYRROLATE 1 MG PO TABS
1.0000 mg | ORAL_TABLET | ORAL | Status: DC | PRN
Start: 2020-07-31 — End: 2020-08-01

## 2020-07-31 MED ORDER — ONDANSETRON 4 MG PO TBDP: 4.0000 mg | ORAL_TABLET | Freq: Four times a day (QID) | ORAL | Status: DC | PRN

## 2020-07-31 MED ORDER — BIOTENE DRY MOUTH MT LIQD: 15.0000 mL | OROMUCOSAL | Status: DC | PRN

## 2020-07-31 MED ORDER — METOPROLOL TARTRATE 5 MG/5ML IV SOLN: 2.5000 mg | Freq: Three times a day (TID) | INTRAVENOUS | Status: DC

## 2020-07-31 MED ORDER — LORAZEPAM 2 MG/ML IJ SOLN: 1.0000 mg | INTRAMUSCULAR | Status: DC | PRN

## 2020-07-31 MED ORDER — MORPHINE 100MG IN NS 100ML (1MG/ML) PREMIX INFUSION
2.0000 mg/h | INTRAVENOUS | Status: DC
  Administered 2020-07-31: 2 mg/h via INTRAVENOUS
  Filled 2020-07-31: qty 100

## 2020-07-31 MED ORDER — CHLORHEXIDINE GLUCONATE CLOTH 2 % EX PADS: 6.0000 | MEDICATED_PAD | Freq: Every day | CUTANEOUS | Status: DC

## 2020-07-31 MED ORDER — LORAZEPAM 2 MG/ML IJ SOLN
1.0000 mg | INTRAMUSCULAR | Status: DC | PRN
  Administered 2020-07-31: 1 mg via INTRAVENOUS
  Filled 2020-07-31: qty 1

## 2020-07-31 MED ORDER — MORPHINE SULFATE (PF) 2 MG/ML IV SOLN
2.0000 mg | INTRAVENOUS | Status: DC | PRN
  Administered 2020-07-31: 2 mg via INTRAVENOUS
  Filled 2020-07-31: qty 1

## 2020-07-31 MED ORDER — HALOPERIDOL LACTATE 5 MG/ML IJ SOLN
0.5000 mg | INTRAMUSCULAR | Status: DC | PRN
Start: 2020-07-31 — End: 2020-08-01

## 2020-07-31 MED ORDER — GLYCOPYRROLATE 0.2 MG/ML IJ SOLN
0.2000 mg | INTRAMUSCULAR | Status: DC | PRN
Start: 2020-07-31 — End: 2020-08-01

## 2020-07-31 MED ORDER — LORAZEPAM 2 MG/ML IJ SOLN
1.0000 mg | Freq: Four times a day (QID) | INTRAMUSCULAR | Status: DC
  Administered 2020-07-31: 1 mg via INTRAVENOUS
  Filled 2020-07-31: qty 1

## 2020-07-31 MED ORDER — GLYCOPYRROLATE 0.2 MG/ML IJ SOLN: 0.2000 mg | INTRAMUSCULAR | Status: DC | PRN

## 2020-07-31 MED ORDER — HALOPERIDOL LACTATE 2 MG/ML PO CONC: 0.5000 mg | ORAL | Status: DC | PRN

## 2020-07-31 MED ORDER — POLYVINYL ALCOHOL 1.4 % OP SOLN: 1.0000 [drp] | Freq: Four times a day (QID) | OPHTHALMIC | Status: DC | PRN

## 2020-07-31 MED ORDER — HALOPERIDOL 0.5 MG PO TABS
0.5000 mg | ORAL_TABLET | ORAL | Status: DC | PRN
Start: 2020-07-31 — End: 2020-08-01

## 2020-07-31 MED ORDER — ONDANSETRON HCL 4 MG/2ML IJ SOLN: 4.0000 mg | Freq: Four times a day (QID) | INTRAMUSCULAR | Status: DC | PRN

## 2020-07-31 MED ORDER — ACETAMINOPHEN 325 MG PO TABS: 650.0000 mg | ORAL_TABLET | Freq: Four times a day (QID) | ORAL | Status: DC | PRN

## 2020-07-31 NOTE — Progress Notes (Signed)
Pt's daughter and son here. Updated on today's events and pt's current condition. Both state understanding. [name], NP notified that family members are now here in room.

## 2020-07-31 NOTE — Progress Notes (Signed)
Isabel Jew, NP states that at 1550 she administered a 2mg  Morphine bolus via the IV pump and increased drip rate to 3mg /hr due to increased WOB and distress/grunting. Pt currently with quieter respirations, rate 28/min but in no obvious distress.

## 2020-07-31 NOTE — Progress Notes (Signed)
Pt's son and daughter have left room after speaking with Palliative care NP. Pt remains unresponsive, resp rate 32/min, blowing with prolonged expiratory phase. SaO2 99% on 15 lpm NRB. Other VSS. Skin warm and dry, no cyanosis noted. Per NP [name], pt will be changed to comfort care with agreement of family.

## 2020-07-31 NOTE — Progress Notes (Signed)
Pt expired tonight at 2021 as assessed by this nurse and Bree RN.  Dr. Carren Rang notified of ToD and is to fill out death certificate.  Pt daughter [name] notified and will be coming to visit pt.  Family wants to use [employer].  Assessment and post mortem documentation complete.

## 2020-07-31 NOTE — Progress Notes (Addendum)
Palliative: [name] is lying quietly in bed.  She has had another seizure this morning and her respiratory status is now compromised.  She does not interact with staff in any meaningful way, does not open her eyes.  There is no family at bedside at this time.  Call to daughter, [name].  Left voicemail message requesting visit to the hospital for update.  Notified by nursing staff later in the afternoon that family is at bedside.  [name] and I talked about what is happened with [name].  We talked about comfort and dignity, let nature take its course.  I share my concern that time is short due to respiratory status.  [name] states that [name] has "fought the fight".  They are agreeable to full comfort care, the use of morphine for respiratory distress.  We talked about prognosis with permission comfort orders implemented.  Conference with attending, bedside nursing staff, transition of care team related to patient condition, needs, goals of care, anticipated hospital death.  Plan:   FULL COMFORT CARE.  Comfort orders implemented. Morphine infusion started, 1 mg bolus given, increased basal rate to 3 mg. Nursing notified.  Prognosis: Hours, anticipate in hospital death.  50 minutes  Isabel Carmel, NP Palliative medicine team Team phone [phone] Greater than 50% of this time was spent counseling and coordinating care related to the above assessment and plan.

## 2020-07-31 NOTE — Progress Notes (Signed)
PROGRESS NOTE  Isabel ShortsJessie M [name] WUJ:[id number] DOB: Sep 15, 1928 DOA: 07/02/2020 PCP: Caesar BookmanNgetich, Dinah C, NP  Brief History:  87 y.o.femalewith medical history ofdementia, CKD stage III, hypertension, hyperlipidemia, peripheral vascular disease, atrial fibrillation presenting with low hemoglobin.  At baseline, the patient is essentially bedbound/wheelchair bound. She requires significant assistance for activities of daily living, but she is able to feed herself. Interestingly, the patient son is the primary caregiver, but the patient's daughter provides much better history. Daughterstates that the patient has not been complaining of any chest pain, shortness breath, vomiting, diarrhea, dysuria. He has not noted any hematochezia, hematuria, hematemesis, epistaxis, vaginal bleeding. shestates that the patient had been recently started on an antibiotic for her leg wound. There is been no other new medications.  Daughter states that about a week ago, the patient went to see her PCP for routine visit. There were no able to obtain blood from venipuncture. As result, home health nurse was sent to the home for INR check and routine blood work. This was performed on 07/24/2020. Once the CBC resulted (5.0), [facility]Family states that the patient has been eating fairly well at baseline. Denies any recent trauma to the patient. However, they suspected the patient has been trying to get out of bed and may have hit the side rails on her hospital bed.  Once the patient arrived to the floor, I was contacted by RN to see the patient secondary to a large hematoma noted on the patient's right thoracic cage area involving her right breast. Interestingly, the patient's son who is a primary caregiver was not aware of this hematoma. Also the patient was noted to have hypothermia with temperature of 93.2 F. Patient was given vitamin K to reverse warfarin 3/25.  Her Hgb remained stable  after vitamin K and PRBC transfusion.  On 3/27 evening, patient had altered mental status.  MRI brain on 07/28/20 showed evidence of cardioembolic stroke.  Her mental status continued to decline.  Palliative medicine was consulted.  Assessment/Plan: Acute blood loss anemia -Per ED provider, FOBT was negative -due to chest wall hematoma -2 units PRBC ordered -Given vitamin K -continue Holding Coumadin -Hgb largely stable since transfusion -No further blood work anticipated; will focus on full comfort care only.  Acute on chronic renal failure--CKD stage IIIb -Secondary to hemodynamic changes and volume depletion -Baseline hemoglobin 1.5-1.8 -Presented with hemoglobin 4.67 -Renal ultrasound--Nondiagnostic evaluation. Kidneys and bladder could not be identified, and evaluation is limited due to inability to position the patient. -no hydronephrosis on CT abd -patient is not a dialysis candidate. -Will focus on full code for and symptomatic management only.  Coagulopathy/supratherapeutic INR -INR 9.5>>1.3 -vitamin K given -continue holding warfarin -started PPI -No anticoagulation will be pursued -Full comfort care and symptomatic management: -Hospital death anticipated.  Acute metabolic Encephalopathy -3/28 repeat UA/culture-->follow culture, UA 21-50WBC -MR brain-->acute confluent right parieto-occipital white matter infarct and multiple additional punctate acute infarcts in bilateral frontoparietal lobes and inferior cerebellum. Given the distribution, infarcts may be embolic -B12--being repleted -replete folate -TSH 1.831 -multifactorial including including ABLA and cardioembolic stroke -remains somnolent; does not speak or follow commands -Experiencing intermittent ongoing seizure disorder, with postictal state and obtunded process.  Unable to take by mouth. -After further discussion with palliative care service decision has been made for  full comfort care. -Nausea is  made worse; hospital death anticipated.  Acute Cardioembolic Stroke -neurology consult appreciated -carotid US--no hemodynamically significant  stenosis -Echo -PT-->SNF -Speech eval-->NPO -restart AC pending palliative meeting with family -family inclining to hospice -no anticoagulation to be pursuit; transitioning to full comfort care with anticipated hospital death.  Seizure -PRN lorazepam and keppra ordered -Full comfort care has been decided -Follow-up recommendations by palliative care service and symptomatic management -Very poor prognosis -Hospital death anticipated.  Metabolic acidosis -Started IV bicarbonate>>discontinue 3/28 -Patient care has been transitioned to full comfort; no follow-up anticipated.  Right ankle wound -see pictures -wound care consult appreciated, will follow care recommendations. -We will focus on comfort care now.  Leukemoid reaction -Check PCT--0.37 -3/25--UA and urine culture--unrevealing -Personally reviewed chest x-ray--extremely rotated, questionRLL effusion/infiltrate -3/26 CT chest--8.3 x 10.2 x 9.2 cm right chest hematoma likely a intramuscular hematoma centered between the pectoralis major and minor musculature, extending into the deep axilla; bipical scarring;  Mild centrilobular emphysema -initially started on empiric cefepime-->stopped 3/27 -overall improved--continue to monitor off abx  Hypothermia -am cortisol 30.2 -blood cultures neg -TSH 1.831 -3/25 urine culture unrevealing -improved  Macrocytic anemia -Check B12--129-->started B12 -Check folic acid--6.2  Essential hypertension -continue IV lopressor -No longer able to take p.o.'s -We will transition to full comfort care.  Dementia without behavioral disturbance -Unable to take fluids at this time -Aricept has been discontinued -Patient will be transition to full comfort care.  Hyperlipidemia -Transition to full comfort -Unable to tolerate  p.o.'s -Ascites discontinued.  Chronic atrial fibrillation -Holding Coumadin secondary to coagulopathy -Patient will be transition to full comfort care at this moment hospital death anticipated.    Status is: Inpatient  Remains inpatient appropriate because:Altered mental status   Dispo: The patient is from: Home              Anticipated d/c is to: Home with hospice.              Patient currently is not medically stable to d/c.   Difficult to place patient No     Family Communication: sister updated 3/29  Consultants:  Neurology; palliative  Code Status:  DNR/DNI  DVT Prophylaxis:  SCD, warfarin on hold   Procedures: As Listed in Progress Note Above  Antibiotics: cefepim 3/25>>3/27    Subjective: Low-grade temperature overnight; patient with reported episode of seizure and ongoing postictal status.  Now also requiring nonrebreather to maintain oxygen saturation.  Unable to take p.o.'s.  Obtunded and not following commands.  No eating or drinking.  Objective: Vitals:    1035  1044  1056  1112  BP: (!) 180/62 (!) 182/61 (!) 185/107 (!) 176/83  Pulse: (!) 119 (!) 117 (!) 126 94  Resp: (!) 28 (!) 28 (!) 28 (!) 28  Temp:    99.8 F (37.7 C)  TempSrc:      SpO2: (!) 77% 91% 96% 100%  Weight:      Height:        Intake/Output Summary (Last 24 hours) at  1320 Last data filed at  0900 Gross per 24 hour  Intake 0 ml  Output 200 ml  Net -200 ml   Weight change:   Exam: General exam: New episode of seizure appreciated; currently requiring nonrebreather mask to maintain saturation, no following commands, unable to tolerate p.o.'s.  Low-grade temperature overnight. Respiratory system: Intermittent episode of apnea; positive tachypnea, no wheezing, no crackles. Cardiovascular system: Irregular, no rubs, no gallops; no JVD on exam. Gastrointestinal system: Abdomen is nondistended, soft and nontender. No  organomegaly or masses felt. Normal bowel sounds heard. Central nervous system: Unable to  properly assess in the setting of current altered mentation/obtunded situation. Extremities: No cyanosis or clubbing. Skin: No petechiae.  Unchanged dry open wound in her right ankle. Refer to epic media images for further details.   Psychiatry: Unable to properly assess currently.  No functioning complaints.  Data Reviewed: I have personally reviewed following labs and imaging studies  Basic Metabolic Panel: Recent Labs  Lab 07/26/20 0513 07/27/20 0753 07/28/20 0418 07/29/20 0627 07/30/20 0528  NA 142 139 141 141 143  K 3.7 3.4* 3.5 3.3* 3.7  CL 110 106 103 105 108  CO2 20* GLUCOSE 176* 95 99 90 98  BUN 66* 58* 53* 47* 44*  CREATININE 4.37* 4.05* 3.61* 3.29* 3.09*  CALCIUM 7.4* 6.8* 6.7* 6.7* 7.3*   Liver Function Tests: Recent Labs  Lab 07/24/2020 1024 07/27/20 0753 07/28/20 0418 07/29/20 0627  AST ALT ALKPHOS 59 56 59 64  BILITOT 0.4 0.6 0.7 1.1  PROT 5.4* 5.0* 5.2* 5.1*  ALBUMIN 2.2* 2.1* 2.1* 2.0*   Coagulation Profile: Recent Labs  Lab 07/02/2020 1024 07/26/20 0513  INR 9.5* 1.3*   CBC: Recent Labs  Lab 07/08/2020 1024 07/26/20 0513 07/27/20 0753 07/28/20 0418 07/29/20 0627 07/30/20 0528  WBC 13.5* 14.1* 11.7* 11.4* 11.1* 10.8*  NEUTROABS 11.0*  --   --   --   --   --   HGB 4.0* 7.6* 7.4* 7.2* 7.7* 7.6*  HCT 14.1* 22.6* 21.8* 22.0* 24.2* 24.0*  MCV 111.9* 97.0 97.3 98.7 100.0 103.0*  PLT 334 251 214 196 174 167   CBG: Recent Labs  Lab 07/26/2020 0945 07/28/20 1935 07/29/20 1217 07/29/20 1630  GLUCAP 106* 92 75 70   Urine analysis:    Component Value Date/Time   COLORURINE YELLOW 07/28/2020 1451   APPEARANCEUR HAZY (A) 07/28/2020 1451   LABSPEC 1.009 07/28/2020 1451   PHURINE 8.0 07/28/2020 1451   GLUCOSEU NEGATIVE 07/28/2020 1451   HGBUR MODERATE (A) 07/28/2020 1451   BILIRUBINUR NEGATIVE 07/28/2020 1451    BILIRUBINUR neg 04/13/2012 1612   KETONESUR NEGATIVE 07/28/2020 1451   PROTEINUR 100 (A) 07/28/2020 1451   UROBILINOGEN 0.2 10/04/2012 1514   NITRITE NEGATIVE 07/28/2020 1451   LEUKOCYTESUR TRACE (A) 07/28/2020 1451   Sepsis Labs:  Recent Results (from the past 240 hour(s))  Culture, Urine     Status: Abnormal   Collection Time: 07/10/2020 10:35 AM   Specimen: Urine, Clean Catch  Result Value Ref Range Status   Specimen Description   Final    URINE, CLEAN CATCH Performed at [facility], [address], [place], [place] [address]    Special Requests   Final    NONE Performed at [facility], [address], [place], [place] [address]    Culture MULTIPLE SPECIES PRESENT, SUGGEST RECOLLECTION (A)  Final   Report Status 07/28/2020 FINAL  Final  MRSA PCR Screening     Status: None   Collection Time: 07/20/2020  3:37 PM   Specimen: Nasal Mucosa; Nasopharyngeal  Result Value Ref Range Status   MRSA by PCR NEGATIVE NEGATIVE Final    Comment:        The GeneXpert MRSA Assay (FDA approved for NASAL specimens only), is one component of a comprehensive MRSA colonization surveillance program. It is not intended to diagnose MRSA infection nor to guide or monitor treatment for MRSA infections. Performed at [facility], [address], [place], [place] [address]   Culture,  blood (Routine X 2) w Reflex to ID Panel     Status: None   Collection Time: 07/22/2020  8:11 PM   Specimen: BLOOD LEFT HAND  Result Value Ref Range Status   Specimen Description BLOOD LEFT HAND  Final   Special Requests   Final    BOTTLES DRAWN AEROBIC ONLY Blood Culture adequate volume   Culture   Final    NO GROWTH 5 DAYS Performed at [facility], [address], [place], [place] [address]    Report Status 07/30/2020 FINAL  Final  Culture, blood (Routine X 2) w Reflex to ID Panel     Status: None   Collection Time: 07/26/2020  8:21 PM   Specimen: BLOOD LEFT ARM  Result Value Ref Range Status   Specimen  Description BLOOD LEFT ARM  Final   Special Requests   Final    BOTTLES DRAWN AEROBIC ONLY Blood Culture adequate volume   Culture   Final    NO GROWTH 5 DAYS Performed at [facility], [address] [place], [place] [address]    Report Status 07/30/2020 FINAL  Final  Culture, Urine     Status: Abnormal   Collection Time: 07/28/20  2:51 PM   Specimen: Urine, Catheterized  Result Value Ref Range Status   Specimen Description   Final    URINE, CATHETERIZED Performed at [facility], [address], [place], [place] [address]    Special Requests   Final    NONE Performed at [facility], [address], [place], [place] [address]    Culture 60,000 COLONIES/mL YEAST (A)  Final   Report Status 07/30/2020 FINAL  Final    Scheduled Meds: . amLODipine  10 mg Oral Daily  . cyanocobalamin  1,000 mcg Subcutaneous Daily  . donepezil  10 mg Oral Daily  . metoprolol tartrate  2.5 mg Intravenous Q6H  . pantoprazole (PROTONIX) IV  40 mg Intravenous Q24H  . pravastatin  10 mg Oral Daily   Continuous Infusions: . dextrose 5 % and 0.9 % NaCl with KCl 20 mEq/L 75 mL/hr at  1104  . levETIRAcetam 500 mg ( 0318)    Procedures/Studies: CT ABDOMEN PELVIS WO CONTRAST  Result Date: 07/26/2020 CLINICAL DATA:  Anemia, supratherapeutic INR, concern for retroperitoneal hematoma EXAM: CT CHEST, ABDOMEN AND PELVIS WITHOUT CONTRAST TECHNIQUE: Multidetector CT imaging of the chest, abdomen and pelvis was performed following the standard protocol without IV contrast. COMPARISON:  CT chest dated 07/15/2018 FINDINGS: CT CHEST FINDINGS Cardiovascular: Cardiomegaly with left atrial enlargement. No pericardial effusion. No evidence of thoracic aortic aneurysm. Atherosclerotic calcifications of the aortic arch. Coronary atherosclerosis of the LAD and right coronary artery. Mediastinum/Nodes: No suspicious mediastinal lymphadenopathy. Visualized thyroid is unremarkable. Lungs/Pleura: Biapical  pleural-parenchymal scarring. Trace chronic right pleural fluid with scarring/volume loss in the right lower lobe, improved. Mild centrilobular emphysematous changes in the upper lobes. Mild subpleural reticulation in the lungs bilaterally. Evaluation lung parenchyma is constrained by respiratory motion. Within that constraint, there are no suspicious pulmonary nodules. No pneumothorax. Musculoskeletal: 8.3 x 10.2 x 9.2 cm right chest hematoma (series 3/image 8), likely a intramuscular hematoma centered between the pectoralis major and minor musculature, extending into the deep axilla. Surrounding fluid/chest wall edema extending inferiorly along the dependent right anterior abdominal wall (series 3/image 44). Mild degenerative changes of the visualized thoracolumbar spine. Moderate superior endplate compression fracture deformity at T12, chronic. No retropulsion. CT ABDOMEN PELVIS FINDINGS Hepatobiliary: Unenhanced liver is unremarkable. Gallbladder is grossly unremarkable. No intrahepatic  or extrahepatic ductal dilatation. Pancreas: Grossly unremarkable. Spleen: Within normal limits. Adrenals/Urinary Tract: Adrenal glands are within normal limits. Kidneys are within normal limits. No renal calculi or hydronephrosis. Bladder is within normal limits. Stomach/Bowel: Stomach is within normal limits. No evidence of bowel obstruction. Appendix is not discretely visualized. Sigmoid diverticulosis, without evidence of diverticulitis. Vascular/Lymphatic: No evidence of abdominal aortic aneurysm. Atherosclerotic calcifications of the abdominal aorta and branch vessels. No suspicious abdominopelvic lymphadenopathy. Reproductive: Suspected prior hysterectomy. No adnexal masses. Other: No abdominopelvic ascites. No free air. Musculoskeletal: Degenerative changes of the lumbar spine. IMPRESSION: 10.2 cm intramuscular hematoma in the right chest wall, as described above, likely secondary to the patient's supratherapeutic INR.  Additional chronic ancillary findings, as described above. Aortic Atherosclerosis (ICD10-I70.0) and Emphysema (ICD10-J43.9). Electronically Signed   By: Charline Bills M.D.   On: 07/26/2020 06:11   CT Head Wo Contrast  Result Date: 08/22/2020 CLINICAL DATA:  Altered mental status EXAM: CT HEAD WITHOUT CONTRAST TECHNIQUE: Contiguous axial images were obtained from the base of the skull through the vertex without intravenous contrast. COMPARISON:  None. FINDINGS: Brain: No evidence of acute infarction, hemorrhage, hydrocephalus, extra-axial collection or mass lesion/mass effect. Advanced low-density changes within the periventricular and subcortical white matter compatible with chronic microvascular ischemic change. Moderate diffuse cerebral volume loss. Vascular: Atherosclerotic calcifications involving the large vessels of the skull base. No unexpected hyperdense vessel. Skull: Normal. Negative for fracture or focal lesion. Sinuses/Orbits: Complete opacification of the bilateral mastoid air cells. Right maxillary mucous retention cyst. Other: None. IMPRESSION: 1. No acute intracranial findings. 2. Advanced chronic microvascular ischemic change and cerebral volume loss. 3. Complete opacification of the bilateral mastoid air cells. Electronically Signed   By: Duanne Guess D.O.   On: 08/05/2020 12:40   CT CHEST WO CONTRAST  Result Date: 07/26/2020 CLINICAL DATA:  Anemia, supratherapeutic INR, concern for retroperitoneal hematoma EXAM: CT CHEST, ABDOMEN AND PELVIS WITHOUT CONTRAST TECHNIQUE: Multidetector CT imaging of the chest, abdomen and pelvis was performed following the standard protocol without IV contrast. COMPARISON:  CT chest dated 07/15/2018 FINDINGS: CT CHEST FINDINGS Cardiovascular: Cardiomegaly with left atrial enlargement. No pericardial effusion. No evidence of thoracic aortic aneurysm. Atherosclerotic calcifications of the aortic arch. Coronary atherosclerosis of the LAD and right  coronary artery. Mediastinum/Nodes: No suspicious mediastinal lymphadenopathy. Visualized thyroid is unremarkable. Lungs/Pleura: Biapical pleural-parenchymal scarring. Trace chronic right pleural fluid with scarring/volume loss in the right lower lobe, improved. Mild centrilobular emphysematous changes in the upper lobes. Mild subpleural reticulation in the lungs bilaterally. Evaluation lung parenchyma is constrained by respiratory motion. Within that constraint, there are no suspicious pulmonary nodules. No pneumothorax. Musculoskeletal: 8.3 x 10.2 x 9.2 cm right chest hematoma (series 3/image 8), likely a intramuscular hematoma centered between the pectoralis major and minor musculature, extending into the deep axilla. Surrounding fluid/chest wall edema extending inferiorly along the dependent right anterior abdominal wall (series 3/image 44). Mild degenerative changes of the visualized thoracolumbar spine. Moderate superior endplate compression fracture deformity at T12, chronic. No retropulsion. CT ABDOMEN PELVIS FINDINGS Hepatobiliary: Unenhanced liver is unremarkable. Gallbladder is grossly unremarkable. No intrahepatic or extrahepatic ductal dilatation. Pancreas: Grossly unremarkable. Spleen: Within normal limits. Adrenals/Urinary Tract: Adrenal glands are within normal limits. Kidneys are within normal limits. No renal calculi or hydronephrosis. Bladder is within normal limits. Stomach/Bowel: Stomach is within normal limits. No evidence of bowel obstruction. Appendix is not discretely visualized. Sigmoid diverticulosis, without evidence of diverticulitis. Vascular/Lymphatic: No evidence of abdominal aortic aneurysm. Atherosclerotic calcifications of the abdominal aorta and  branch vessels. No suspicious abdominopelvic lymphadenopathy. Reproductive: Suspected prior hysterectomy. No adnexal masses. Other: No abdominopelvic ascites. No free air. Musculoskeletal: Degenerative changes of the lumbar spine.  IMPRESSION: 10.2 cm intramuscular hematoma in the right chest wall, as described above, likely secondary to the patient's supratherapeutic INR. Additional chronic ancillary findings, as described above. Aortic Atherosclerosis (ICD10-I70.0) and Emphysema (ICD10-J43.9). Electronically Signed   By: Charline Bills M.D.   On: 07/26/2020 06:11   MR ANGIO HEAD WO CONTRAST  Result Date: 07/28/2020 CLINICAL DATA:  Neuro deficit, acute stroke suspected. Facial droop and altered mental status. EXAM: MRI HEAD WITHOUT CONTRAST MRA HEAD WITHOUT CONTRAST TECHNIQUE: Multiplanar, multiecho pulse sequences of the brain and surrounding structures were obtained without intravenous contrast. Angiographic images of the head were obtained using MRA technique without contrast. COMPARISON:  CT head July 25, 2020. FINDINGS: MRI HEAD FINDINGS Motion limited study. Brain: Confluent restricted diffusion in the right parieto-occipital region, which appears to predominantly involve white matter. Multiple bilateral punctate acute infarcts involving bilateral frontal and parietal lobes (both cortex and white matter). Multiple punctate infarcts involving the inferior cerebellum bilaterally (series 5, image 5 and series 7, image 6 and 8). No substantial mass effect. No evidence of acute hemorrhage. Advanced scattered T2/FLAIR hyperintensity within the white matter and pons, most likely related to chronic microvascular ischemic disease. No evidence of mass lesion. No midline shift. Generalized cerebral volume loss with ex vacuo ventricular dilation. No hydrocephalus. Vascular: Major arterial flow voids are maintained at the skull base. Skull and upper cervical spine: Normal marrow signal. Sinuses/Orbits: Inferior right maxillary sinus mucosal cyst. Otherwise, sinuses are largely clear. Unremarkable orbits. Other: Bilateral mastoid effusions. MRA HEAD FINDINGS Anterior circulation: Patent bilateral ICAs. Patent MCAs and ACAs with limited  evaluation for stenosis or aneurysm due to motion Posterior circulation: Intradural vertebral arteries are partially imaged. The right vertebral artery appears to largely terminate as PICA. The visualized intradural left vertebral artery is patent. The basilar artery and proximal bilateral posterior cerebral arteries are patent. Suspected moderate stenosis of the left P2 PCA IMPRESSION: MRI head: 1. Motion limited study with evidence of acute confluent right parieto-occipital white matter infarct and multiple additional punctate acute infarcts in bilateral frontoparietal lobes and inferior cerebellum. Given the distribution, infarcts may be embolic and/or watershed in etiology. No substantial mass effect. 2. Advanced chronic microvascular ischemic disease. 3. Bilateral mastoid effusions. MRA: 1. Motion limited evaluation without evidence of emergent large vessel occlusion. 2. Possible moderate stenosis of the left P2 PCA. Otherwise, limited evaluation for stenosis (particularly in the anterior circulation) given motion. Follow-up CTA could provide more sensitive evaluation if clinically indicated. These results will be called to the ordering clinician or representative by the Radiologist Assistant, and communication documented in the PACS or [employer]. Electronically Signed   By: Feliberto Harts MD   On: 07/28/2020 13:46   MR BRAIN WO CONTRAST  Result Date: 07/28/2020 CLINICAL DATA:  Neuro deficit, acute stroke suspected. Facial droop and altered mental status. EXAM: MRI HEAD WITHOUT CONTRAST MRA HEAD WITHOUT CONTRAST TECHNIQUE: Multiplanar, multiecho pulse sequences of the brain and surrounding structures were obtained without intravenous contrast. Angiographic images of the head were obtained using MRA technique without contrast. COMPARISON:  CT head July 25, 2020. FINDINGS: MRI HEAD FINDINGS Motion limited study. Brain: Confluent restricted diffusion in the right parieto-occipital region, which  appears to predominantly involve white matter. Multiple bilateral punctate acute infarcts involving bilateral frontal and parietal lobes (both cortex and white matter). Multiple punctate infarcts  involving the inferior cerebellum bilaterally (series 5, image 5 and series 7, image 6 and 8). No substantial mass effect. No evidence of acute hemorrhage. Advanced scattered T2/FLAIR hyperintensity within the white matter and pons, most likely related to chronic microvascular ischemic disease. No evidence of mass lesion. No midline shift. Generalized cerebral volume loss with ex vacuo ventricular dilation. No hydrocephalus. Vascular: Major arterial flow voids are maintained at the skull base. Skull and upper cervical spine: Normal marrow signal. Sinuses/Orbits: Inferior right maxillary sinus mucosal cyst. Otherwise, sinuses are largely clear. Unremarkable orbits. Other: Bilateral mastoid effusions. MRA HEAD FINDINGS Anterior circulation: Patent bilateral ICAs. Patent MCAs and ACAs with limited evaluation for stenosis or aneurysm due to motion Posterior circulation: Intradural vertebral arteries are partially imaged. The right vertebral artery appears to largely terminate as PICA. The visualized intradural left vertebral artery is patent. The basilar artery and proximal bilateral posterior cerebral arteries are patent. Suspected moderate stenosis of the left P2 PCA IMPRESSION: MRI head: 1. Motion limited study with evidence of acute confluent right parieto-occipital white matter infarct and multiple additional punctate acute infarcts in bilateral frontoparietal lobes and inferior cerebellum. Given the distribution, infarcts may be embolic and/or watershed in etiology. No substantial mass effect. 2. Advanced chronic microvascular ischemic disease. 3. Bilateral mastoid effusions. MRA: 1. Motion limited evaluation without evidence of emergent large vessel occlusion. 2. Possible moderate stenosis of the left P2 PCA. Otherwise,  limited evaluation for stenosis (particularly in the anterior circulation) given motion. Follow-up CTA could provide more sensitive evaluation if clinically indicated. These results will be called to the ordering clinician or representative by the Radiologist Assistant, and communication documented in the PACS or [employer]. Electronically Signed   By: Feliberto Harts MD   On: 07/28/2020 13:46   US RENAL  Result Date: 08/15/20 CLINICAL DATA:  Acute on chronic renal failure, tobacco abuse, chronic renal insufficiency EXAM: RENAL / URINARY TRACT ULTRASOUND COMPLETE COMPARISON:  None. FINDINGS: Right Kidney: Not visualized. Limited by patient cooperation and inability to position the patient. Left Kidney: Not visualized. Limited by patient cooperation and inability to position the patient. Bladder: Not visualized. Limited by patient cooperation and inability to position patient. Other: None. IMPRESSION: 1. Nondiagnostic evaluation. Kidneys and bladder could not be identified, and evaluation is limited due to inability to position the patient. Electronically Signed   By: Sharlet Salina M.D.   On: 2020/08/15 15:15   US Carotid Bilateral  Result Date: 07/29/2020 CLINICAL DATA:  Hypertension Hyperlipidemia Tobacco use Dementia Peripheral vascular disease Neurologic deficit Acute stroke suspected Facial droop Altered mental status EXAM: BILATERAL CAROTID DUPLEX ULTRASOUND TECHNIQUE: Wallace Cullens scale imaging, color Doppler and duplex ultrasound were performed of bilateral carotid and vertebral arteries in the neck. COMPARISON:  None. FINDINGS: Criteria: Quantification of carotid stenosis is based on velocity parameters that correlate the residual internal carotid diameter with NASCET-based stenosis levels, using the diameter of the distal internal carotid lumen as the denominator for stenosis measurement. The following velocity measurements were obtained: RIGHT ICA: 70/8 cm/sec CCA: 86/5 cm/sec SYSTOLIC ICA/CCA  RATIO:  0.8 ECA: 52 cm/sec LEFT ICA: 90/13 cm/sec CCA: 76/7 cm/sec SYSTOLIC ICA/CCA RATIO:  1.2 ECA: 40 cm/sec RIGHT CAROTID ARTERY: Mild atherosclerotic plaque of the right carotid bulb without flow-limiting stenosis. RIGHT VERTEBRAL ARTERY:  Antegrade flow. LEFT CAROTID ARTERY: Mild atheromatous plaque of the distal common and proximal internal carotid arteries without high-grade stenosis. LEFT VERTEBRAL ARTERY:  Antegrade flow. IMPRESSION: Less than 50% stenosis of the internal carotid arteries. Electronically  Signed   By: Acquanetta Belling M.D.   On: 07/29/2020 10:45   DG Chest Portable 1 View  Result Date: 2020-08-20 CLINICAL DATA:  Altered mental status EXAM: PORTABLE CHEST 1 VIEW COMPARISON:  Chest CT from July 15, 2018 FINDINGS: Limited rotated chest. Asymmetric density at the right base with costophrenic sulcus blunting. No edema or pneumothorax. Large volume lungs in general. IMPRESSION: 1. Limited rotated chest. 2. Small right pleural effusion which could be chronic or related to scarring based on a 2020 chest CT. Electronically Signed   By: Marnee Spring M.D.   On: 08-20-2020 10:48   ECHOCARDIOGRAM COMPLETE  Result Date: 07/30/2020    ECHOCARDIOGRAM REPORT   Patient Name:   [name] Date of Exam: 07/30/2020 Medical Rec #:  [id number]     Height:       61.0 in Accession #:    [id number]    Weight:       118.2 lb Date of Birth:  04/10/1929     BSA:          1.510 m Patient Age:    87 years      BP:           178/66 mmHg Patient Gender: F             HR:           79 bpm. [facility] Procedure: 2D Echo Indications:    Stroke I63.9  History:        Patient has prior history of Echocardiogram examinations, most                 recent 05/01/2010. Stroke, Arrythmias:Atrial Fibrillation; Risk                 Factors:Non-Smoker, Hypertension and Dyslipidemia.  Sonographer:    Jeryl Columbia RDCS (AE) Referring Phys: [phone] DAVID TAT IMPRESSIONS  1. Left ventricular ejection fraction, by  estimation, is 60 to 65%. The left ventricle has normal function. The left ventricle has no regional wall motion abnormalities. There is mild left ventricular hypertrophy. Left ventricular diastolic parameters are indeterminate.  2. Right ventricular systolic function is normal. The right ventricular size is normal. There is moderately elevated pulmonary artery systolic pressure. The estimated right ventricular systolic pressure is 47.1 mmHg.  3. Left atrial size was moderately dilated.  4. The mitral valve is degenerative. Mild mitral valve regurgitation.  5. The aortic valve is tricuspid. Aortic valve regurgitation is not visualized.  6. The inferior vena cava is normal in size with greater than 50% respiratory variability, suggesting right atrial pressure of 3 mmHg. FINDINGS  Left Ventricle: Left ventricular ejection fraction, by estimation, is 60 to 65%. The left ventricle has normal function. The left ventricle has no regional wall motion abnormalities. The left ventricular internal cavity size was normal in size. There is  mild left ventricular hypertrophy. Left ventricular diastolic parameters are indeterminate. Right Ventricle: The right ventricular size is normal. No increase in right ventricular wall thickness. Right ventricular systolic function is normal. There is moderately elevated pulmonary artery systolic pressure. The tricuspid regurgitant velocity is 3.32 m/s, and with an assumed right atrial pressure of 3 mmHg, the estimated right ventricular systolic pressure is 47.1 mmHg. Left Atrium: Left atrial size was moderately dilated. Right Atrium: Right atrial size was normal in size. Pericardium: There is no evidence of pericardial effusion. Mitral Valve: The mitral valve is degenerative in appearance. There is mild thickening of  the mitral valve leaflet(s). There is mild calcification of the mitral valve leaflet(s). Mild mitral valve regurgitation. Tricuspid Valve: The tricuspid valve is grossly  normal. Tricuspid valve regurgitation is trivial. Aortic Valve: The aortic valve is tricuspid. There is mild aortic valve annular calcification. Aortic valve regurgitation is not visualized. Pulmonic Valve: The pulmonic valve was grossly normal. Pulmonic valve regurgitation is trivial. Aorta: The aortic root is normal in size and structure. Venous: The inferior vena cava is normal in size with greater than 50% respiratory variability, suggesting right atrial pressure of 3 mmHg. IAS/Shunts: No atrial level shunt detected by color flow Doppler.  LEFT VENTRICLE PLAX 2D LVIDd:         3.58 cm  Diastology LVIDs:         2.74 cm  LV e' medial:    6.32 cm/s LV PW:         1.24 cm  LV E/e' medial:  22.0 LV IVS:        0.82 cm  LV e' lateral:   7.22 cm/s LVOT diam:     1.80 cm  LV E/e' lateral: 19.3 LVOT Area:     2.54 cm  RIGHT VENTRICLE RV S prime:     9.77 cm/s TAPSE (M-mode): 1.7 cm LEFT ATRIUM             Index       RIGHT ATRIUM           Index LA diam:        3.70 cm 2.45 cm/m  RA Area:     16.50 cm LA Vol (A2C):   75.8 ml 50.19 ml/m RA Volume:   44.80 ml  29.66 ml/m LA Vol (A4C):   70.4 ml 46.61 ml/m LA Biplane Vol: 74.8 ml 49.53 ml/m   AORTA Ao Root diam: 2.80 cm MITRAL VALVE                TRICUSPID VALVE MV Area (PHT): 3.20 cm     TR Peak grad:   44.1 mmHg MV Decel Time: 237 msec     TR Vmax:        332.00 cm/s MR Peak grad: 151.3 mmHg MR Vmax:      615.00 cm/s   SHUNTS MV E velocity: 139.00 cm/s  Systemic Diam: 1.80 cm MV A velocity: 66.20 cm/s MV E/A ratio:  2.10 Nona Dell MD Electronically signed by Nona Dell MD Signature Date/Time: 07/30/2020/4:10:44 PM    Final     Vassie Loll, MD  [facility]  If 7PM-7AM, please contact night-coverage [email or web address] Password [email or web address] , 1:20 PM   LOS: 6 days

## 2020-07-31 NOTE — Progress Notes (Signed)
PRN Ativan 1mg  IV and scheduled lopressor IV admin'd. Pt with no further seizure activity, resp remain 28/min with prolonged respirations but nonlabored. Chest clear to auscultation. SaO2 100 on 15 liter NRB. HR continues afib but rate now 85-98. Pt grunts/moans when arms moved or head repositioned, but no purposeful response. This is no real change from pt's baseline.

## 2020-07-31 NOTE — Progress Notes (Signed)
Pt remains unresponsive, even to pain at this time. Pt's resp rate remains 28/min, SaO2 98% with 15 lpm NRB. [name], NP with Palliative care in to evaluate. NRB mask removed and pt changed to 4 lpm [place]. SaO2 slowy decreased into mid 80's after 10 min, so NRB replaced per Marice Potter, NP, and she states that she is going to call family for decisions for ongoing care.

## 2020-07-31 NOTE — Progress Notes (Signed)
MD Gwenlyn Perking in to evaluate pt. Updated on current status and discussion with palliative care nurse. No new orders at this time.

## 2020-07-31 NOTE — Progress Notes (Signed)
Pt with increased resp effort, grunting, even s/p IV morphine and ativan. Morphine drip started per order with 2mg  bolus admin'd per order of , NP, who is present in the room.

## 2020-07-31 NOTE — Progress Notes (Signed)
Pt remains unresponsive, resp rate 32/min, deep with prolonged expiratory effort. Morphine 2mg  IV given for work of breathing.

## 2020-07-31 NOTE — Progress Notes (Signed)
Sitting outside pt's room charting, noted pt's left arm jerking and pt's breathing heavy. In to room, pt appears to be having seizure, facial drawing and gaze deviated to right, left arm jerking, pt with salivary foam noted from mouth. SaO2 77%, resp deep, pt already on 3 lpm [place], changed to NRB 15 lpm for respiratory support, B/P 180/62, HR irregular 115 - 125. MD Gwenlyn Perking notified via [facility] page.

## 2020-07-31 NOTE — Progress Notes (Signed)
Pt's resp 11 per min having 15 second periods of apnea with some grunting. Appears comfortable and calm. Will continue to monitor. RN made aware.

## 2020-07-31 NOTE — Progress Notes (Signed)
Patient found in room with no pulse or respirations.  Patient death pronounced by two rns.  Family and MD notified.

## 2020-07-31 NOTE — Progress Notes (Signed)
SLP Cancellation Note  Patient Details Name: [name] MRN: [id number] DOB: 1928/08/02   Cancelled treatment:       Reason Eval/Treat Not Completed: Fatigue/lethargy limiting ability to participate;Patient's level of consciousness;Patient not medically ready   Thank you,  Havery Moros, CCC-SLP [phone]    Micael Barb , 11:31 AM

## 2020-07-31 NOTE — Progress Notes (Signed)
[name], NP removed O2. Pt with increased resp effort, even no more grunting. Pt appears calm and comfortable. Will continue to monitor patient.

## 2020-07-31 NOTE — Progress Notes (Addendum)
Nutrition Brief Note  Chart reviewed. Per palliative care notes, pt likely transitioning to hospice care at this time. BSE completed on 07/30/20; recommendations of NPO with comfort feeds if desired. Pt and family do not desire a PEG tube.  No further nutrition interventions planned at this time.  Please re-consult as needed.   Levada Schilling, RD, LDN, CDCES Registered Dietitian II Certified Diabetes Care and Education Specialist Please refer to [facility] for RD and/or RD on-call/weekend/after hours pager

## 2020-08-01 DEATH — deceased

## 2020-08-31 NOTE — Progress Notes (Signed)
Granddaughter [name] helped with post -mortem care.  Notified supervisor of patient request.  Request approved by MD.  Patient daughter just helped to watch patient, and daughter placed holy oil on patient. Charge nurse present in room with daughter while care provided.

## 2020-08-31 NOTE — Death Summary Note (Signed)
DEATH SUMMARY   Patient Details  Name: [name] MRN: [id number] DOB: [birth year]  Admission/Discharge Information   Admit Date:  2021/02/12  Date of Death: Date of Death:   Time of Death: Time of Death: 2021  Length of Stay: 7  Referring Physician: Caesar BookmanNgetich, Dinah C, NP   Reason(s) for Hospitalization  Low hemoglobin and supratherapeutic INR.  Diagnoses  Preliminary cause of death: Sequela of cardioembolic stroke Secondary Diagnoses (including complications and co-morbidities):  Active Problems:   Hyperlipidemia   Hypertension   ATRIAL FIBRILLATION, CHRONIC   Chronic anticoagulation   Tobacco abuse, in remission   Acute blood loss anemia   Acute renal failure superimposed on stage 3b chronic kidney disease (HCC)   Coagulopathy (HCC)   Elevated INR   Cardioembolic stroke [facility](HCC)   Seizure disorder  Brief Hospital Course (including significant findings, care, treatment, and services provided and events leading to death)  [name] is a 87 y.o. year old female with medical history ofdementia, CKD stage III, hypertension, hyperlipidemia, peripheral vascular disease, atrial fibrillation presenting with low hemoglobin. At baseline, the patient is essentially bedbound/wheelchair bound. She requires significant assistance for activities of daily living, but she is able to feed herself. Interestingly, the patient son is the primary caregiver, but the patient's daughter provides much better history. Daughterstates that the patient has not been complaining of any chest pain, shortness breath, vomiting, diarrhea, dysuria. He has not noted any hematochezia, hematuria, hematemesis, epistaxis, vaginal bleeding. shestates that the patient had been recently started on an antibiotic for her leg wound. There is been no other new medications.  Daughter states that about a week ago, the patient went to see her PCP for routine visit. There were no able to obtain blood from  venipuncture. As result, home health nurse was sent to the home for INR check and routine blood work. This was performed on 07/24/2020. Once the CBC resulted (5.0), [facility]Family states that the patient has been eating fairly well at baseline. Denies any recent trauma to the patient. However, they suspected the patient has been trying to get out of bed and may have hit the side rails on her hospital bed.  Once the patient arrived to the floor, I was contacted by RN to see the patient secondary to a large hematoma noted on the patient's right thoracic cage area involving her right breast. Interestingly, the patient's son who is a primary caregiver was not aware of this hematoma. Also the patient was noted to have hypothermia with temperature of 93.2 F. Patient was given vitamin K to reverse warfarin 3/25.  Her Hgb remained stable after vitamin K and PRBC transfusion.  On 3/27 evening, patient had altered mental status.  MRI brain on 07/28/20 showed evidence of cardioembolic stroke. Her mental status continued to decline actively and patient developed seizures. Patient was not capable of tolerating oral meds or adequate nutrition. Palliative medicine was consulted and after discussing GOC with family, transition to full comfort care and symptomatic management decided. Patient peacefully expired on  at 8:21 pm.    Pertinent Labs and Studies  Significant Diagnostic Studies CT ABDOMEN PELVIS WO CONTRAST  Result Date: 07/26/2020 CLINICAL DATA:  Anemia, supratherapeutic INR, concern for retroperitoneal hematoma EXAM: CT CHEST, ABDOMEN AND PELVIS WITHOUT CONTRAST TECHNIQUE: Multidetector CT imaging of the chest, abdomen and pelvis was performed following the standard protocol without IV contrast. COMPARISON:  CT chest dated 07/15/2018 FINDINGS: CT CHEST FINDINGS Cardiovascular: Cardiomegaly with left atrial  enlargement. No pericardial effusion. No evidence of thoracic aortic  aneurysm. Atherosclerotic calcifications of the aortic arch. Coronary atherosclerosis of the LAD and right coronary artery. Mediastinum/Nodes: No suspicious mediastinal lymphadenopathy. Visualized thyroid is unremarkable. Lungs/Pleura: Biapical pleural-parenchymal scarring. Trace chronic right pleural fluid with scarring/volume loss in the right lower lobe, improved. Mild centrilobular emphysematous changes in the upper lobes. Mild subpleural reticulation in the lungs bilaterally. Evaluation lung parenchyma is constrained by respiratory motion. Within that constraint, there are no suspicious pulmonary nodules. No pneumothorax. Musculoskeletal: 8.3 x 10.2 x 9.2 cm right chest hematoma (series 3/image 8), likely a intramuscular hematoma centered between the pectoralis major and minor musculature, extending into the deep axilla. Surrounding fluid/chest wall edema extending inferiorly along the dependent right anterior abdominal wall (series 3/image 44). Mild degenerative changes of the visualized thoracolumbar spine. Moderate superior endplate compression fracture deformity at T12, chronic. No retropulsion. CT ABDOMEN PELVIS FINDINGS Hepatobiliary: Unenhanced liver is unremarkable. Gallbladder is grossly unremarkable. No intrahepatic or extrahepatic ductal dilatation. Pancreas: Grossly unremarkable. Spleen: Within normal limits. Adrenals/Urinary Tract: Adrenal glands are within normal limits. Kidneys are within normal limits. No renal calculi or hydronephrosis. Bladder is within normal limits. Stomach/Bowel: Stomach is within normal limits. No evidence of bowel obstruction. Appendix is not discretely visualized. Sigmoid diverticulosis, without evidence of diverticulitis. Vascular/Lymphatic: No evidence of abdominal aortic aneurysm. Atherosclerotic calcifications of the abdominal aorta and branch vessels. No suspicious abdominopelvic lymphadenopathy. Reproductive: Suspected prior hysterectomy. No adnexal masses. Other:  No abdominopelvic ascites. No free air. Musculoskeletal: Degenerative changes of the lumbar spine. IMPRESSION: 10.2 cm intramuscular hematoma in the right chest wall, as described above, likely secondary to the patient's supratherapeutic INR. Additional chronic ancillary findings, as described above. Aortic Atherosclerosis (ICD10-I70.0) and Emphysema (ICD10-J43.9). Electronically Signed   By: Charline Bills M.D.   On: 07/26/2020 06:11   CT Head Wo Contrast  Result Date: July 28, 2020 CLINICAL DATA:  Altered mental status EXAM: CT HEAD WITHOUT CONTRAST TECHNIQUE: Contiguous axial images were obtained from the base of the skull through the vertex without intravenous contrast. COMPARISON:  None. FINDINGS: Brain: No evidence of acute infarction, hemorrhage, hydrocephalus, extra-axial collection or mass lesion/mass effect. Advanced low-density changes within the periventricular and subcortical white matter compatible with chronic microvascular ischemic change. Moderate diffuse cerebral volume loss. Vascular: Atherosclerotic calcifications involving the large vessels of the skull base. No unexpected hyperdense vessel. Skull: Normal. Negative for fracture or focal lesion. Sinuses/Orbits: Complete opacification of the bilateral mastoid air cells. Right maxillary mucous retention cyst. Other: None. IMPRESSION: 1. No acute intracranial findings. 2. Advanced chronic microvascular ischemic change and cerebral volume loss. 3. Complete opacification of the bilateral mastoid air cells. Electronically Signed   By: Duanne Guess D.O.   On: Jul 28, 2020 12:40   CT CHEST WO CONTRAST  Result Date: 07/26/2020 CLINICAL DATA:  Anemia, supratherapeutic INR, concern for retroperitoneal hematoma EXAM: CT CHEST, ABDOMEN AND PELVIS WITHOUT CONTRAST TECHNIQUE: Multidetector CT imaging of the chest, abdomen and pelvis was performed following the standard protocol without IV contrast. COMPARISON:  CT chest dated 07/15/2018 FINDINGS: CT  CHEST FINDINGS Cardiovascular: Cardiomegaly with left atrial enlargement. No pericardial effusion. No evidence of thoracic aortic aneurysm. Atherosclerotic calcifications of the aortic arch. Coronary atherosclerosis of the LAD and right coronary artery. Mediastinum/Nodes: No suspicious mediastinal lymphadenopathy. Visualized thyroid is unremarkable. Lungs/Pleura: Biapical pleural-parenchymal scarring. Trace chronic right pleural fluid with scarring/volume loss in the right lower lobe, improved. Mild centrilobular emphysematous changes in the upper lobes. Mild subpleural reticulation in the lungs bilaterally. Evaluation lung  parenchyma is constrained by respiratory motion. Within that constraint, there are no suspicious pulmonary nodules. No pneumothorax. Musculoskeletal: 8.3 x 10.2 x 9.2 cm right chest hematoma (series 3/image 8), likely a intramuscular hematoma centered between the pectoralis major and minor musculature, extending into the deep axilla. Surrounding fluid/chest wall edema extending inferiorly along the dependent right anterior abdominal wall (series 3/image 44). Mild degenerative changes of the visualized thoracolumbar spine. Moderate superior endplate compression fracture deformity at T12, chronic. No retropulsion. CT ABDOMEN PELVIS FINDINGS Hepatobiliary: Unenhanced liver is unremarkable. Gallbladder is grossly unremarkable. No intrahepatic or extrahepatic ductal dilatation. Pancreas: Grossly unremarkable. Spleen: Within normal limits. Adrenals/Urinary Tract: Adrenal glands are within normal limits. Kidneys are within normal limits. No renal calculi or hydronephrosis. Bladder is within normal limits. Stomach/Bowel: Stomach is within normal limits. No evidence of bowel obstruction. Appendix is not discretely visualized. Sigmoid diverticulosis, without evidence of diverticulitis. Vascular/Lymphatic: No evidence of abdominal aortic aneurysm. Atherosclerotic calcifications of the abdominal aorta and  branch vessels. No suspicious abdominopelvic lymphadenopathy. Reproductive: Suspected prior hysterectomy. No adnexal masses. Other: No abdominopelvic ascites. No free air. Musculoskeletal: Degenerative changes of the lumbar spine. IMPRESSION: 10.2 cm intramuscular hematoma in the right chest wall, as described above, likely secondary to the patient's supratherapeutic INR. Additional chronic ancillary findings, as described above. Aortic Atherosclerosis (ICD10-I70.0) and Emphysema (ICD10-J43.9). Electronically Signed   By: Charline Bills M.D.   On: 07/26/2020 06:11   MR ANGIO HEAD WO CONTRAST  Result Date: 07/28/2020 CLINICAL DATA:  Neuro deficit, acute stroke suspected. Facial droop and altered mental status. EXAM: MRI HEAD WITHOUT CONTRAST MRA HEAD WITHOUT CONTRAST TECHNIQUE: Multiplanar, multiecho pulse sequences of the brain and surrounding structures were obtained without intravenous contrast. Angiographic images of the head were obtained using MRA technique without contrast. COMPARISON:  CT head July 25, 2020. FINDINGS: MRI HEAD FINDINGS Motion limited study. Brain: Confluent restricted diffusion in the right parieto-occipital region, which appears to predominantly involve white matter. Multiple bilateral punctate acute infarcts involving bilateral frontal and parietal lobes (both cortex and white matter). Multiple punctate infarcts involving the inferior cerebellum bilaterally (series 5, image 5 and series 7, image 6 and 8). No substantial mass effect. No evidence of acute hemorrhage. Advanced scattered T2/FLAIR hyperintensity within the white matter and pons, most likely related to chronic microvascular ischemic disease. No evidence of mass lesion. No midline shift. Generalized cerebral volume loss with ex vacuo ventricular dilation. No hydrocephalus. Vascular: Major arterial flow voids are maintained at the skull base. Skull and upper cervical spine: Normal marrow signal. Sinuses/Orbits: Inferior  right maxillary sinus mucosal cyst. Otherwise, sinuses are largely clear. Unremarkable orbits. Other: Bilateral mastoid effusions. MRA HEAD FINDINGS Anterior circulation: Patent bilateral ICAs. Patent MCAs and ACAs with limited evaluation for stenosis or aneurysm due to motion Posterior circulation: Intradural vertebral arteries are partially imaged. The right vertebral artery appears to largely terminate as PICA. The visualized intradural left vertebral artery is patent. The basilar artery and proximal bilateral posterior cerebral arteries are patent. Suspected moderate stenosis of the left P2 PCA IMPRESSION: MRI head: 1. Motion limited study with evidence of acute confluent right parieto-occipital white matter infarct and multiple additional punctate acute infarcts in bilateral frontoparietal lobes and inferior cerebellum. Given the distribution, infarcts may be embolic and/or watershed in etiology. No substantial mass effect. 2. Advanced chronic microvascular ischemic disease. 3. Bilateral mastoid effusions. MRA: 1. Motion limited evaluation without evidence of emergent large vessel occlusion. 2. Possible moderate stenosis of the left P2 PCA. Otherwise, limited evaluation for  stenosis (particularly in the anterior circulation) given motion. Follow-up CTA could provide more sensitive evaluation if clinically indicated. These results will be called to the ordering clinician or representative by the Radiologist Assistant, and communication documented in the PACS or [employer]. Electronically Signed   By: Feliberto Harts MD   On: 07/28/2020 13:46   MR BRAIN WO CONTRAST  Result Date: 07/28/2020 CLINICAL DATA:  Neuro deficit, acute stroke suspected. Facial droop and altered mental status. EXAM: MRI HEAD WITHOUT CONTRAST MRA HEAD WITHOUT CONTRAST TECHNIQUE: Multiplanar, multiecho pulse sequences of the brain and surrounding structures were obtained without intravenous contrast. Angiographic images of the  head were obtained using MRA technique without contrast. COMPARISON:  CT head 23-Aug-2020. FINDINGS: MRI HEAD FINDINGS Motion limited study. Brain: Confluent restricted diffusion in the right parieto-occipital region, which appears to predominantly involve white matter. Multiple bilateral punctate acute infarcts involving bilateral frontal and parietal lobes (both cortex and white matter). Multiple punctate infarcts involving the inferior cerebellum bilaterally (series 5, image 5 and series 7, image 6 and 8). No substantial mass effect. No evidence of acute hemorrhage. Advanced scattered T2/FLAIR hyperintensity within the white matter and pons, most likely related to chronic microvascular ischemic disease. No evidence of mass lesion. No midline shift. Generalized cerebral volume loss with ex vacuo ventricular dilation. No hydrocephalus. Vascular: Major arterial flow voids are maintained at the skull base. Skull and upper cervical spine: Normal marrow signal. Sinuses/Orbits: Inferior right maxillary sinus mucosal cyst. Otherwise, sinuses are largely clear. Unremarkable orbits. Other: Bilateral mastoid effusions. MRA HEAD FINDINGS Anterior circulation: Patent bilateral ICAs. Patent MCAs and ACAs with limited evaluation for stenosis or aneurysm due to motion Posterior circulation: Intradural vertebral arteries are partially imaged. The right vertebral artery appears to largely terminate as PICA. The visualized intradural left vertebral artery is patent. The basilar artery and proximal bilateral posterior cerebral arteries are patent. Suspected moderate stenosis of the left P2 PCA IMPRESSION: MRI head: 1. Motion limited study with evidence of acute confluent right parieto-occipital white matter infarct and multiple additional punctate acute infarcts in bilateral frontoparietal lobes and inferior cerebellum. Given the distribution, infarcts may be embolic and/or watershed in etiology. No substantial mass effect. 2.  Advanced chronic microvascular ischemic disease. 3. Bilateral mastoid effusions. MRA: 1. Motion limited evaluation without evidence of emergent large vessel occlusion. 2. Possible moderate stenosis of the left P2 PCA. Otherwise, limited evaluation for stenosis (particularly in the anterior circulation) given motion. Follow-up CTA could provide more sensitive evaluation if clinically indicated. These results will be called to the ordering clinician or representative by the Radiologist Assistant, and communication documented in the PACS or [employer]. Electronically Signed   By: Feliberto Harts MD   On: 07/28/2020 13:46   US RENAL  Result Date: 08/23/20 CLINICAL DATA:  Acute on chronic renal failure, tobacco abuse, chronic renal insufficiency EXAM: RENAL / URINARY TRACT ULTRASOUND COMPLETE COMPARISON:  None. FINDINGS: Right Kidney: Not visualized. Limited by patient cooperation and inability to position the patient. Left Kidney: Not visualized. Limited by patient cooperation and inability to position the patient. Bladder: Not visualized. Limited by patient cooperation and inability to position patient. Other: None. IMPRESSION: 1. Nondiagnostic evaluation. Kidneys and bladder could not be identified, and evaluation is limited due to inability to position the patient. Electronically Signed   By: Sharlet Salina M.D.   On: 08/23/2020 15:15   US Carotid Bilateral  Result Date: 07/29/2020 CLINICAL DATA:  Hypertension Hyperlipidemia Tobacco use Dementia Peripheral vascular disease  Neurologic deficit Acute stroke suspected Facial droop Altered mental status EXAM: BILATERAL CAROTID DUPLEX ULTRASOUND TECHNIQUE: Wallace Cullens scale imaging, color Doppler and duplex ultrasound were performed of bilateral carotid and vertebral arteries in the neck. COMPARISON:  None. FINDINGS: Criteria: Quantification of carotid stenosis is based on velocity parameters that correlate the residual internal carotid diameter with  NASCET-based stenosis levels, using the diameter of the distal internal carotid lumen as the denominator for stenosis measurement. The following velocity measurements were obtained: RIGHT ICA: 70/8 cm/sec CCA: 86/5 cm/sec SYSTOLIC ICA/CCA RATIO:  0.8 ECA: 52 cm/sec LEFT ICA: 90/13 cm/sec CCA: 76/7 cm/sec SYSTOLIC ICA/CCA RATIO:  1.2 ECA: 40 cm/sec RIGHT CAROTID ARTERY: Mild atherosclerotic plaque of the right carotid bulb without flow-limiting stenosis. RIGHT VERTEBRAL ARTERY:  Antegrade flow. LEFT CAROTID ARTERY: Mild atheromatous plaque of the distal common and proximal internal carotid arteries without high-grade stenosis. LEFT VERTEBRAL ARTERY:  Antegrade flow. IMPRESSION: Less than 50% stenosis of the internal carotid arteries. Electronically Signed   By: Acquanetta Belling M.D.   On: 07/29/2020 10:45   DG Chest Portable 1 View  Result Date: 08-08-20 CLINICAL DATA:  Altered mental status EXAM: PORTABLE CHEST 1 VIEW COMPARISON:  Chest CT from July 15, 2018 FINDINGS: Limited rotated chest. Asymmetric density at the right base with costophrenic sulcus blunting. No edema or pneumothorax. Large volume lungs in general. IMPRESSION: 1. Limited rotated chest. 2. Small right pleural effusion which could be chronic or related to scarring based on a 2020 chest CT. Electronically Signed   By: Marnee Spring M.D.   On: 2020/08/08 10:48   ECHOCARDIOGRAM COMPLETE  Result Date: 07/30/2020    ECHOCARDIOGRAM REPORT   Patient Name:   [name] Date of Exam: 07/30/2020 Medical Rec #:  [id number]     Height:       61.0 in Accession #:    [id number]    Weight:       118.2 lb Date of Birth:  11/04/1928     BSA:          1.510 m Patient Age:    87 years      BP:           178/66 mmHg Patient Gender: F             HR:           79 bpm. [facility] Procedure: 2D Echo Indications:    Stroke I63.9  History:        Patient has prior history of Echocardiogram examinations, most                 recent 05/01/2010.  Stroke, Arrythmias:Atrial Fibrillation; Risk                 Factors:Non-Smoker, Hypertension and Dyslipidemia.  Sonographer:    Jeryl Columbia RDCS (AE) Referring Phys: [phone] DAVID TAT IMPRESSIONS  1. Left ventricular ejection fraction, by estimation, is 60 to 65%. The left ventricle has normal function. The left ventricle has no regional wall motion abnormalities. There is mild left ventricular hypertrophy. Left ventricular diastolic parameters are indeterminate.  2. Right ventricular systolic function is normal. The right ventricular size is normal. There is moderately elevated pulmonary artery systolic pressure. The estimated right ventricular systolic pressure is 47.1 mmHg.  3. Left atrial size was moderately dilated.  4. The mitral valve is degenerative. Mild mitral valve regurgitation.  5. The aortic valve is tricuspid. Aortic valve regurgitation is not visualized.  6.  The inferior vena cava is normal in size with greater than 50% respiratory variability, suggesting right atrial pressure of 3 mmHg. FINDINGS  Left Ventricle: Left ventricular ejection fraction, by estimation, is 60 to 65%. The left ventricle has normal function. The left ventricle has no regional wall motion abnormalities. The left ventricular internal cavity size was normal in size. There is  mild left ventricular hypertrophy. Left ventricular diastolic parameters are indeterminate. Right Ventricle: The right ventricular size is normal. No increase in right ventricular wall thickness. Right ventricular systolic function is normal. There is moderately elevated pulmonary artery systolic pressure. The tricuspid regurgitant velocity is 3.32 m/s, and with an assumed right atrial pressure of 3 mmHg, the estimated right ventricular systolic pressure is 47.1 mmHg. Left Atrium: Left atrial size was moderately dilated. Right Atrium: Right atrial size was normal in size. Pericardium: There is no evidence of pericardial effusion. Mitral Valve: The mitral  valve is degenerative in appearance. There is mild thickening of the mitral valve leaflet(s). There is mild calcification of the mitral valve leaflet(s). Mild mitral valve regurgitation. Tricuspid Valve: The tricuspid valve is grossly normal. Tricuspid valve regurgitation is trivial. Aortic Valve: The aortic valve is tricuspid. There is mild aortic valve annular calcification. Aortic valve regurgitation is not visualized. Pulmonic Valve: The pulmonic valve was grossly normal. Pulmonic valve regurgitation is trivial. Aorta: The aortic root is normal in size and structure. Venous: The inferior vena cava is normal in size with greater than 50% respiratory variability, suggesting right atrial pressure of 3 mmHg. IAS/Shunts: No atrial level shunt detected by color flow Doppler.  LEFT VENTRICLE PLAX 2D LVIDd:         3.58 cm  Diastology LVIDs:         2.74 cm  LV e' medial:    6.32 cm/s LV PW:         1.24 cm  LV E/e' medial:  22.0 LV IVS:        0.82 cm  LV e' lateral:   7.22 cm/s LVOT diam:     1.80 cm  LV E/e' lateral: 19.3 LVOT Area:     2.54 cm  RIGHT VENTRICLE RV S prime:     9.77 cm/s TAPSE (M-mode): 1.7 cm LEFT ATRIUM             Index       RIGHT ATRIUM           Index LA diam:        3.70 cm 2.45 cm/m  RA Area:     16.50 cm LA Vol (A2C):   75.8 ml 50.19 ml/m RA Volume:   44.80 ml  29.66 ml/m LA Vol (A4C):   70.4 ml 46.61 ml/m LA Biplane Vol: 74.8 ml 49.53 ml/m   AORTA Ao Root diam: 2.80 cm MITRAL VALVE                TRICUSPID VALVE MV Area (PHT): 3.20 cm     TR Peak grad:   44.1 mmHg MV Decel Time: 237 msec     TR Vmax:        332.00 cm/s MR Peak grad: 151.3 mmHg MR Vmax:      615.00 cm/s   SHUNTS MV E velocity: 139.00 cm/s  Systemic Diam: 1.80 cm MV A velocity: 66.20 cm/s MV E/A ratio:  2.10 Nona Dell MD Electronically signed by Nona Dell MD Signature Date/Time: 07/30/2020/4:10:44 PM    Final     Microbiology Recent Results (from  the past 240 hour(s))  Culture, Urine     Status:  Abnormal   Collection Time: 07/27/20 10:35 AM   Specimen: Urine, Clean Catch  Result Value Ref Range Status   Specimen Description   Final    URINE, CLEAN CATCH Performed at [facility], [address] [place], [place] [address]    Special Requests   Final    NONE Performed at [facility], [address] [place], [place] [address]    Culture MULTIPLE SPECIES PRESENT, SUGGEST RECOLLECTION (A)  Final   Report Status 07/28/2020 FINAL  Final  MRSA PCR Screening     Status: None   Collection Time: 2020/07/27  3:37 PM   Specimen: Nasal Mucosa; Nasopharyngeal  Result Value Ref Range Status   MRSA by PCR NEGATIVE NEGATIVE Final    Comment:        The GeneXpert MRSA Assay (FDA approved for NASAL specimens only), is one component of a comprehensive MRSA colonization surveillance program. It is not intended to diagnose MRSA infection nor to guide or monitor treatment for MRSA infections. Performed at [facility], [address], [place], [place] [address]   Culture, blood (Routine X 2) w Reflex to ID Panel     Status: None   Collection Time: 07-27-20  8:11 PM   Specimen: BLOOD LEFT HAND  Result Value Ref Range Status   Specimen Description BLOOD LEFT HAND  Final   Special Requests   Final    BOTTLES DRAWN AEROBIC ONLY Blood Culture adequate volume   Culture   Final    NO GROWTH 5 DAYS Performed at [facility], [address], [place], [place] [address]    Report Status 07/30/2020 FINAL  Final  Culture, blood (Routine X 2) w Reflex to ID Panel     Status: None   Collection Time: 2020/07/27  8:21 PM   Specimen: BLOOD LEFT ARM  Result Value Ref Range Status   Specimen Description BLOOD LEFT ARM  Final   Special Requests   Final    BOTTLES DRAWN AEROBIC ONLY Blood Culture adequate volume   Culture   Final    NO GROWTH 5 DAYS Performed at [facility], [address], [place], [place] [address]    Report Status 07/30/2020 FINAL  Final  Culture, Urine     Status: Abnormal    Collection Time: 07/28/20  2:51 PM   Specimen: Urine, Catheterized  Result Value Ref Range Status   Specimen Description   Final    URINE, CATHETERIZED Performed at [facility], [address] [place], [place] [address]    Special Requests   Final    NONE Performed at [facility], [address] [place], [place] [address]    Culture 60,000 COLONIES/mL YEAST (A)  Final   Report Status 07/30/2020 FINAL  Final    Lab Basic Metabolic Panel: Recent Labs  Lab 07/26/20 0513 07/27/20 0753 07/28/20 0418 07/29/20 0627 07/30/20 0528  NA 142 139 141 141 143  K 3.7 3.4* 3.5 3.3* 3.7  CL 110 106 103 105 108  CO2 20* 22 25 24 22   GLUCOSE 176* 95 99 90 98  BUN 66* 58* 53* 47* 44*  CREATININE 4.37* 4.05* 3.61* 3.29* 3.09*  CALCIUM 7.4* 6.8* 6.7* 6.7* 7.3*   Liver Function Tests: Recent Labs  Lab 2020/07/27 1024 07/27/20 0753 07/28/20 0418 07/29/20 0627  AST 21 27 25 27   ALT 9 17 16 16   ALKPHOS 59 56 59 64  BILITOT 0.4  0.6 0.7 1.1  PROT 5.4* 5.0* 5.2* 5.1*  ALBUMIN 2.2* 2.1* 2.1* 2.0*   CBC: Recent Labs  Lab Jul 29, 2020 1024 07/26/20 0513 07/27/20 0753 07/28/20 0418 07/29/20 0627 07/30/20 0528  WBC 13.5* 14.1* 11.7* 11.4* 11.1* 10.8*  NEUTROABS 11.0*  --   --   --   --   --   HGB 4.0* 7.6* 7.4* 7.2* 7.7* 7.6*  HCT 14.1* 22.6* 21.8* 22.0* 24.2* 24.0*  MCV 111.9* 97.0 97.3 98.7 100.0 103.0*  PLT 334 251 214 196 174 167   Cardiac Enzymes: No results for input(s): CKTOTAL, CKMB, CKMBINDEX, TROPONINI in the last 168 hours. Sepsis Labs: Recent Labs  Lab 07-29-20 1925 07/26/20 0513 07/27/20 0753 07/28/20 0418 07/29/20 0627 07/30/20 0528  PROCALCITON 0.37  --   --   --   --   --   WBC  --    < > 11.7* 11.4* 11.1* 10.8*   < > = values in this interval not displayed.    Procedures/Operations  See below for x-ray reports. No invasive procedures or operations performed.    Vassie Loll 08/22/2020, 9:15 AM

## 2020-08-31 DEATH — deceased

## 2020-10-02 ENCOUNTER — Telehealth: Payer: Self-pay

## 2020-10-02 NOTE — Telephone Encounter (Signed)
Spoke with Ms Jacklynn Lewis of [facility]. Having some billing issues with claims of the deceased pt. Needed to know the last visit with [facility] and the signer of death summary note. Can be reached at [phone]
